# Patient Record
Sex: Female | Born: 1970
Health system: Southern US, Community
[De-identification: ages and names within clinical notes are randomized; demographics above are authoritative.]

## PROBLEM LIST (undated history)

## (undated) ENCOUNTER — Emergency Department (HOSPITAL_COMMUNITY): Payer: Self-pay

## (undated) DIAGNOSIS — J45909 Unspecified asthma, uncomplicated: Secondary | ICD-10-CM

## (undated) DIAGNOSIS — N750 Cyst of Bartholin's gland: Secondary | ICD-10-CM

## (undated) DIAGNOSIS — K4090 Unilateral inguinal hernia, without obstruction or gangrene, not specified as recurrent: Secondary | ICD-10-CM

## (undated) DIAGNOSIS — D649 Anemia, unspecified: Secondary | ICD-10-CM

## (undated) DIAGNOSIS — I10 Essential (primary) hypertension: Secondary | ICD-10-CM

## (undated) DIAGNOSIS — G935 Compression of brain: Secondary | ICD-10-CM

## (undated) DIAGNOSIS — G709 Myoneural disorder, unspecified: Secondary | ICD-10-CM

## (undated) DIAGNOSIS — J849 Interstitial pulmonary disease, unspecified: Secondary | ICD-10-CM

## (undated) DIAGNOSIS — C801 Malignant (primary) neoplasm, unspecified: Secondary | ICD-10-CM

## (undated) DIAGNOSIS — F419 Anxiety disorder, unspecified: Secondary | ICD-10-CM

## (undated) HISTORY — PX: NO PAST SURGERIES: SHX2092

## (undated) HISTORY — DX: Compression of brain: G93.5

## (undated) HISTORY — PX: OTHER SURGICAL HISTORY: SHX169

---

## 1999-01-04 ENCOUNTER — Encounter: Payer: Self-pay | Admitting: Emergency Medicine

## 1999-01-04 ENCOUNTER — Emergency Department (HOSPITAL_COMMUNITY): Admission: EM | Admit: 1999-01-04 | Discharge: 1999-01-04 | Payer: Self-pay | Admitting: Emergency Medicine

## 1999-12-03 ENCOUNTER — Emergency Department (HOSPITAL_COMMUNITY): Admission: EM | Admit: 1999-12-03 | Discharge: 1999-12-03 | Payer: Self-pay | Admitting: Emergency Medicine

## 1999-12-03 ENCOUNTER — Encounter: Payer: Self-pay | Admitting: Emergency Medicine

## 2000-08-24 ENCOUNTER — Emergency Department (HOSPITAL_COMMUNITY): Admission: EM | Admit: 2000-08-24 | Discharge: 2000-08-24 | Payer: Self-pay | Admitting: Internal Medicine

## 2001-03-01 ENCOUNTER — Emergency Department (HOSPITAL_COMMUNITY): Admission: EM | Admit: 2001-03-01 | Discharge: 2001-03-01 | Payer: Self-pay | Admitting: Emergency Medicine

## 2001-03-01 ENCOUNTER — Encounter: Payer: Self-pay | Admitting: Emergency Medicine

## 2001-03-03 ENCOUNTER — Other Ambulatory Visit: Admission: RE | Admit: 2001-03-03 | Discharge: 2001-03-03 | Payer: Self-pay | Admitting: Gynecology

## 2001-04-28 ENCOUNTER — Encounter: Payer: Self-pay | Admitting: Gynecology

## 2001-04-28 ENCOUNTER — Ambulatory Visit (HOSPITAL_COMMUNITY): Admission: RE | Admit: 2001-04-28 | Discharge: 2001-04-28 | Payer: Self-pay | Admitting: Gynecology

## 2002-03-30 ENCOUNTER — Emergency Department (HOSPITAL_COMMUNITY): Admission: EM | Admit: 2002-03-30 | Discharge: 2002-03-31 | Payer: Self-pay | Admitting: Emergency Medicine

## 2002-05-24 ENCOUNTER — Emergency Department (HOSPITAL_COMMUNITY): Admission: EM | Admit: 2002-05-24 | Discharge: 2002-05-24 | Payer: Self-pay | Admitting: Emergency Medicine

## 2002-05-24 ENCOUNTER — Encounter: Payer: Self-pay | Admitting: Emergency Medicine

## 2003-06-22 ENCOUNTER — Emergency Department (HOSPITAL_COMMUNITY): Admission: EM | Admit: 2003-06-22 | Discharge: 2003-06-22 | Payer: Self-pay | Admitting: Emergency Medicine

## 2003-11-03 ENCOUNTER — Inpatient Hospital Stay (HOSPITAL_COMMUNITY): Admission: AD | Admit: 2003-11-03 | Discharge: 2003-11-03 | Payer: Self-pay | Admitting: Obstetrics

## 2003-12-14 ENCOUNTER — Inpatient Hospital Stay (HOSPITAL_COMMUNITY): Admission: AD | Admit: 2003-12-14 | Discharge: 2003-12-14 | Payer: Self-pay | Admitting: Obstetrics

## 2004-05-21 ENCOUNTER — Emergency Department (HOSPITAL_COMMUNITY): Admission: EM | Admit: 2004-05-21 | Discharge: 2004-05-21 | Payer: Self-pay | Admitting: Emergency Medicine

## 2004-09-14 ENCOUNTER — Emergency Department (HOSPITAL_COMMUNITY): Admission: EM | Admit: 2004-09-14 | Discharge: 2004-09-14 | Payer: Self-pay | Admitting: Emergency Medicine

## 2005-03-11 ENCOUNTER — Inpatient Hospital Stay (HOSPITAL_COMMUNITY): Admission: AD | Admit: 2005-03-11 | Discharge: 2005-03-12 | Payer: Self-pay | Admitting: Obstetrics & Gynecology

## 2005-03-16 ENCOUNTER — Emergency Department (HOSPITAL_COMMUNITY): Admission: EM | Admit: 2005-03-16 | Discharge: 2005-03-16 | Payer: Self-pay | Admitting: Emergency Medicine

## 2006-10-10 ENCOUNTER — Emergency Department (HOSPITAL_COMMUNITY): Admission: EM | Admit: 2006-10-10 | Discharge: 2006-10-11 | Payer: Self-pay | Admitting: Emergency Medicine

## 2006-10-17 ENCOUNTER — Emergency Department (HOSPITAL_COMMUNITY): Admission: EM | Admit: 2006-10-17 | Discharge: 2006-10-17 | Payer: Self-pay | Admitting: Family Medicine

## 2009-01-04 ENCOUNTER — Emergency Department (HOSPITAL_COMMUNITY): Admission: EM | Admit: 2009-01-04 | Discharge: 2009-01-04 | Payer: Self-pay | Admitting: Emergency Medicine

## 2010-04-30 ENCOUNTER — Emergency Department (HOSPITAL_COMMUNITY): Admission: EM | Admit: 2010-04-30 | Discharge: 2010-04-30 | Payer: Self-pay | Admitting: Emergency Medicine

## 2010-08-05 ENCOUNTER — Encounter: Payer: Self-pay | Admitting: Obstetrics & Gynecology

## 2010-08-06 ENCOUNTER — Encounter: Payer: Self-pay | Admitting: Obstetrics & Gynecology

## 2010-10-22 LAB — URINE CULTURE: Colony Count: 9000

## 2010-10-22 LAB — URINALYSIS, ROUTINE W REFLEX MICROSCOPIC
Glucose, UA: NEGATIVE mg/dL
Hgb urine dipstick: NEGATIVE
Ketones, ur: NEGATIVE mg/dL
Nitrite: NEGATIVE
Protein, ur: NEGATIVE mg/dL
Specific Gravity, Urine: 1.029 (ref 1.005–1.030)
Urobilinogen, UA: 1 mg/dL (ref 0.0–1.0)
pH: 5 (ref 5.0–8.0)

## 2010-10-22 LAB — POCT PREGNANCY, URINE: Preg Test, Ur: NEGATIVE

## 2010-11-12 ENCOUNTER — Emergency Department (HOSPITAL_COMMUNITY)
Admission: EM | Admit: 2010-11-12 | Discharge: 2010-11-12 | Disposition: A | Discharge status: Home / Self Care | Payer: Self-pay | Attending: Emergency Medicine | Admitting: Emergency Medicine

## 2010-11-12 DIAGNOSIS — R3915 Urgency of urination: Secondary | ICD-10-CM | POA: Insufficient documentation

## 2010-11-12 DIAGNOSIS — N12 Tubulo-interstitial nephritis, not specified as acute or chronic: Secondary | ICD-10-CM | POA: Insufficient documentation

## 2010-11-12 DIAGNOSIS — R112 Nausea with vomiting, unspecified: Secondary | ICD-10-CM | POA: Insufficient documentation

## 2010-11-12 DIAGNOSIS — R109 Unspecified abdominal pain: Secondary | ICD-10-CM | POA: Insufficient documentation

## 2010-11-12 DIAGNOSIS — R350 Frequency of micturition: Secondary | ICD-10-CM | POA: Insufficient documentation

## 2010-11-12 DIAGNOSIS — R509 Fever, unspecified: Secondary | ICD-10-CM | POA: Insufficient documentation

## 2010-11-12 LAB — URINALYSIS, ROUTINE W REFLEX MICROSCOPIC
Glucose, UA: NEGATIVE mg/dL
Hgb urine dipstick: NEGATIVE
Ketones, ur: NEGATIVE mg/dL
Nitrite: NEGATIVE
Protein, ur: 100 mg/dL — AB
Specific Gravity, Urine: 1.031 — ABNORMAL HIGH (ref 1.005–1.030)
Urobilinogen, UA: 1 mg/dL (ref 0.0–1.0)
pH: 8.5 — ABNORMAL HIGH (ref 5.0–8.0)

## 2010-11-12 LAB — POCT PREGNANCY, URINE: Preg Test, Ur: NEGATIVE

## 2010-11-12 LAB — URINE MICROSCOPIC-ADD ON

## 2011-02-13 ENCOUNTER — Emergency Department (HOSPITAL_COMMUNITY)
Admission: EM | Admit: 2011-02-13 | Discharge: 2011-02-13 | Disposition: A | Discharge status: Home / Self Care | Payer: Self-pay | Attending: Emergency Medicine | Admitting: Emergency Medicine

## 2011-02-13 DIAGNOSIS — J3489 Other specified disorders of nose and nasal sinuses: Secondary | ICD-10-CM | POA: Insufficient documentation

## 2011-02-13 DIAGNOSIS — R51 Headache: Secondary | ICD-10-CM | POA: Insufficient documentation

## 2011-04-08 ENCOUNTER — Encounter: Payer: Self-pay | Admitting: Obstetrics & Gynecology

## 2011-09-05 ENCOUNTER — Encounter (HOSPITAL_COMMUNITY): Payer: Self-pay | Admitting: *Deleted

## 2011-09-05 ENCOUNTER — Emergency Department (HOSPITAL_COMMUNITY)
Admission: EM | Admit: 2011-09-05 | Discharge: 2011-09-05 | Disposition: A | Discharge status: Home Health | Payer: Medicaid Other | Attending: Emergency Medicine | Admitting: Emergency Medicine

## 2011-09-05 ENCOUNTER — Emergency Department (HOSPITAL_COMMUNITY): Payer: Medicaid Other

## 2011-09-05 DIAGNOSIS — R509 Fever, unspecified: Secondary | ICD-10-CM | POA: Insufficient documentation

## 2011-09-05 DIAGNOSIS — F17200 Nicotine dependence, unspecified, uncomplicated: Secondary | ICD-10-CM | POA: Insufficient documentation

## 2011-09-05 DIAGNOSIS — R109 Unspecified abdominal pain: Secondary | ICD-10-CM | POA: Insufficient documentation

## 2011-09-05 DIAGNOSIS — J111 Influenza due to unidentified influenza virus with other respiratory manifestations: Secondary | ICD-10-CM | POA: Insufficient documentation

## 2011-09-05 LAB — CBC
HCT: 36.4 % (ref 36.0–46.0)
Hemoglobin: 13 g/dL (ref 12.0–15.0)
MCH: 24.3 pg — ABNORMAL LOW (ref 26.0–34.0)
MCHC: 35.7 g/dL (ref 30.0–36.0)
MCV: 68 fL — ABNORMAL LOW (ref 78.0–100.0)
Platelets: 192 10*3/uL (ref 150–400)
RBC: 5.35 MIL/uL — ABNORMAL HIGH (ref 3.87–5.11)
RDW: 16.3 % — ABNORMAL HIGH (ref 11.5–15.5)
WBC: 3.7 10*3/uL — ABNORMAL LOW (ref 4.0–10.5)

## 2011-09-05 LAB — URINALYSIS, ROUTINE W REFLEX MICROSCOPIC
Glucose, UA: NEGATIVE mg/dL
Hgb urine dipstick: NEGATIVE
Ketones, ur: 15 mg/dL — AB
Leukocytes, UA: NEGATIVE
Nitrite: NEGATIVE
Protein, ur: 30 mg/dL — AB
Specific Gravity, Urine: >1.046 — ABNORMAL HIGH (ref 1.005–1.030)
Urobilinogen, UA: 1 mg/dL (ref 0.0–1.0)
pH: 5.5 (ref 5.0–8.0)

## 2011-09-05 LAB — URINE MICROSCOPIC-ADD ON

## 2011-09-05 LAB — COMPREHENSIVE METABOLIC PANEL
ALT: 23 U/L (ref 0–35)
AST: 27 U/L (ref 0–37)
Albumin: 4 g/dL (ref 3.5–5.2)
Alkaline Phosphatase: 55 U/L (ref 39–117)
BUN: 11 mg/dL (ref 6–23)
CO2: 24 mEq/L (ref 19–32)
Calcium: 9.3 mg/dL (ref 8.4–10.5)
Chloride: 104 mEq/L (ref 96–112)
Creatinine, Ser: 0.77 mg/dL (ref 0.50–1.10)
GFR calc Af Amer: >90 mL/min (ref >90)
GFR calc non Af Amer: >90 mL/min (ref >90)
Glucose, Bld: 101 mg/dL — ABNORMAL HIGH (ref 70–99)
Potassium: 3.6 mEq/L (ref 3.5–5.1)
Sodium: 139 mEq/L (ref 135–145)
Total Bilirubin: 0.2 mg/dL — ABNORMAL LOW (ref 0.3–1.2)
Total Protein: 8.2 g/dL (ref 6.0–8.3)

## 2011-09-05 LAB — DIFFERENTIAL
Basophils Absolute: 0 10*3/uL (ref 0.0–0.1)
Basophils Relative: 1 % (ref 0–1)
Eosinophils Absolute: 0 10*3/uL (ref 0.0–0.7)
Eosinophils Relative: 0 % (ref 0–5)
Lymphocytes Relative: 36 % (ref 12–46)
Lymphs Abs: 1.4 10*3/uL (ref 0.7–4.0)
Monocytes Absolute: 1.1 10*3/uL — ABNORMAL HIGH (ref 0.1–1.0)
Monocytes Relative: 31 % — ABNORMAL HIGH (ref 3–12)
Neutro Abs: 1.2 10*3/uL — ABNORMAL LOW (ref 1.7–7.7)
Neutrophils Relative %: 32 % — ABNORMAL LOW (ref 43–77)

## 2011-09-05 LAB — PREGNANCY, URINE: Preg Test, Ur: NEGATIVE

## 2011-09-05 LAB — LIPASE, BLOOD: Lipase: 21 U/L (ref 11–59)

## 2011-09-05 MED ORDER — SODIUM CHLORIDE 0.9 % IV BOLUS (SEPSIS)
1000.0000 mL | Freq: Once | INTRAVENOUS | Status: AC
  Administered 2011-09-05: 1000 mL via INTRAVENOUS

## 2011-09-05 MED ORDER — MORPHINE SULFATE 4 MG/ML IJ SOLN
4.0000 mg | Freq: Once | INTRAMUSCULAR | Status: AC
  Administered 2011-09-05: 4 mg via INTRAVENOUS
  Filled 2011-09-05: qty 1

## 2011-09-05 MED ORDER — ONDANSETRON HCL 4 MG/2ML IJ SOLN
4.0000 mg | Freq: Once | INTRAMUSCULAR | Status: AC
  Administered 2011-09-05: 4 mg via INTRAVENOUS
  Filled 2011-09-05: qty 2

## 2011-09-05 MED ORDER — OSELTAMIVIR PHOSPHATE 75 MG PO CAPS: 75.0000 mg | ORAL_CAPSULE | Freq: Two times a day (BID) | ORAL | Status: AC

## 2011-09-05 MED ORDER — TRAMADOL HCL 50 MG PO TABS: 50.0000 mg | ORAL_TABLET | Freq: Four times a day (QID) | ORAL | Status: AC | PRN

## 2011-09-05 MED ORDER — PROMETHAZINE HCL 25 MG PO TABS: 25.0000 mg | ORAL_TABLET | Freq: Four times a day (QID) | ORAL | Status: DC | PRN

## 2011-09-05 NOTE — ED Notes (Signed)
Pt reports abd pain with n/v/d, body aches, and fever since Tuesday.  Pt denies any diarrhea today.

## 2011-09-05 NOTE — ED Provider Notes (Signed)
History     CSN: [id number]  Arrival date & time 09/05/11  1549   First MD Initiated Contact with Patient 09/05/11 1727      Chief Complaint  Patient presents with  . Fever  . Abdominal Pain    with n/v/d    (Consider location/radiation/quality/duration/timing/severity/associated sxs/prior treatment) Patient is a 41 y.o. female presenting with fever and abdominal pain. The history is provided by the patient.  Fever Primary symptoms of the febrile illness include fever, headaches, cough, nausea, vomiting, diarrhea, dysuria and myalgias. Primary symptoms do not include wheezing, shortness of breath, abdominal pain or rash. The current episode started 2 days ago.  The headache is not associated with neck stiffness or weakness.  The myalgias are not associated with weakness.   Abdominal Pain The primary symptoms of the illness include fever, nausea, vomiting, diarrhea and dysuria. The primary symptoms of the illness do not include abdominal pain or shortness of breath.  Additional symptoms associated with the illness include back pain. Symptoms associated with the illness do not include constipation.  Pt has had fever chills, myalgias, HA, cough, N/V/D. Pt denies abd pain. She has had urinary frequency and dysuria with low back pain as well.   History reviewed. No pertinent past medical history.  History reviewed. No pertinent past surgical history.  No family history on file.  History  Substance Use Topics  . Smoking status: Current Everyday Smoker -- 1.0 packs/day    Types: Cigarettes  . Smokeless tobacco: Not on file  . Alcohol Use: No    OB History    Grav Para Term Preterm Abortions TAB SAB Ect Mult Living                  Review of Systems  Constitutional: Positive for fever.  HENT: Positive for ear pain and congestion. Negative for sore throat, neck pain, neck stiffness and sinus pressure.   Respiratory: Positive for cough. Negative for shortness of breath and  wheezing.   Cardiovascular: Negative for chest pain and palpitations.  Gastrointestinal: Positive for nausea, vomiting and diarrhea. Negative for abdominal pain and constipation.  Genitourinary: Positive for dysuria and flank pain.  Musculoskeletal: Positive for myalgias and back pain.  Skin: Negative for color change, pallor, rash and wound.  Neurological: Positive for headaches. Negative for dizziness, syncope, weakness and numbness.    Allergies  Review of patient's allergies indicates no known allergies.  Home Medications   Current Outpatient Rx  Name Route Sig Dispense Refill  . DIPHENHYDRAMINE-APAP (SLEEP) 25-500 MG PO TABS Oral Take 1 tablet by mouth at bedtime as needed. sleep    . OSELTAMIVIR PHOSPHATE 75 MG PO CAPS Oral Take 1 capsule (75 mg total) by mouth every 12 (twelve) hours. 10 capsule 0  . PROMETHAZINE HCL 25 MG PO TABS Oral Take 1 tablet (25 mg total) by mouth every 6 (six) hours as needed for nausea. 30 tablet 0  . TRAMADOL HCL 50 MG PO TABS Oral Take 1 tablet (50 mg total) by mouth every 6 (six) hours as needed for pain. 15 tablet 0    BP 110/73  Pulse 81  Temp(Src) 101 F (38.3 C) (Oral)  Resp 19  SpO2 98%  LMP 08/29/2011  Physical Exam  Nursing note and vitals reviewed. Constitutional: She is oriented to person, place, and time. She appears well-developed and well-nourished. No distress.  HENT:  Head: Normocephalic and atraumatic.  Mouth/Throat: Oropharynx is clear and moist.       bl  TM's bulging. No erythema  Eyes: EOM are normal. Pupils are equal, round, and reactive to light.  Neck: Normal range of motion. Neck supple.  Cardiovascular: Normal rate and regular rhythm.   Pulmonary/Chest: Effort normal and breath sounds normal. No respiratory distress. She has no wheezes. She has no rales.  Abdominal: Soft. Bowel sounds are normal. She exhibits no distension. There is no tenderness. There is no rebound and no guarding.  Musculoskeletal: Normal range  of motion. She exhibits no edema and no tenderness.  Neurological: She is alert and oriented to person, place, and time.       5/5 motor, sensation  Skin: Skin is warm and dry. No rash noted. No erythema.  Psychiatric: She has a normal mood and affect. Her behavior is normal.    ED Course  Procedures (including critical care time)  Labs Reviewed  CBC - Abnormal; Notable for the following:    WBC 3.7 (*)    RBC 5.35 (*)    MCV 68.0 (*)    MCH 24.3 (*)    RDW 16.3 (*)    All other components within normal limits  DIFFERENTIAL - Abnormal; Notable for the following:    Neutrophils Relative 32 (*)    Monocytes Relative 31 (*)    Neutro Abs 1.2 (*)    Monocytes Absolute 1.1 (*)    All other components within normal limits  COMPREHENSIVE METABOLIC PANEL - Abnormal; Notable for the following:    Glucose, Bld 101 (*)    Total Bilirubin 0.2 (*)    All other components within normal limits  URINALYSIS, ROUTINE W REFLEX MICROSCOPIC - Abnormal; Notable for the following:    Color, Urine AMBER (*) BIOCHEMICALS MAY BE AFFECTED BY COLOR   APPearance CLOUDY (*)    Specific Gravity, Urine >1.046 (*)    Bilirubin Urine SMALL (*)    Ketones, ur 15 (*)    Protein, ur 30 (*)    All other components within normal limits  URINE MICROSCOPIC-ADD ON - Abnormal; Notable for the following:    Squamous Epithelial / LPF MANY (*)    All other components within normal limits  LIPASE, BLOOD  PREGNANCY, URINE   Dg Chest 2 View  09/05/2011  *RADIOLOGY REPORT*  Clinical Data: Nausea, vomiting, cough, congestion.  CHEST - 2 VIEW  Comparison: None.  Findings: Heart and mediastinal contours are within normal limits. No focal opacities or effusions.  No acute bony abnormality.  IMPRESSION: No active cardiopulmonary disease.  Original Report Authenticated By: Cyndie Chime, M.D.     1. Flu       MDM          Loren Racer, MD 09/06/11 0000

## 2011-09-05 NOTE — Discharge Instructions (Signed)
Influenza, Adult Influenza ("the flu") is a viral infection of the respiratory tract. It causes chills, fever, cough, headache, body aches, and sore throat. Influenza in general will make you feel sicker than when you have a common cold. Symptoms of the illness typically last a few days. Cough and fatigue may continue for as long as 7 to 10 days. Influenza is highly contagious. It spreads easily to others in the droplets from coughs and sneezes. People frequently become infected by touching something that was recently contaminated with the virus and then touch their mouth, nose or eyes. This infection is caused by a virus. Symptoms will not be reduced or improved by taking an antibiotic. Antibiotics are medications that kill bacteria, not viruses. DIAGNOSIS  Diagnosis of influenza is often made based on the history and physical examination as well as the presence of influenza reports occurring in your community. Testing can be done if the diagnosis is not certain. TREATMENT  Since influenza is caused by a virus, antibiotics are not helpful. Your caregiver may prescribe antiviral medicines to shorten the illness and lessen the severity. Your caregiver may also recommend influenza vaccination and/or antiviral medicines for your family members in order to prevent the spread of influenza to them. HOME CARE INSTRUCTIONS  DO NOT GIVE ASPIRIN TO PERSONS WITH INFLUENZA WHO ARE UNDER 18 YEARS OF AGE. This could lead to brain and liver damage (Reye's syndrome). Read the label on over-the-counter medicines.   Stay home from work or school if at all possible until most of your symptoms are gone.   Only take over-the-counter or prescription medicines for pain, discomfort, or fever as directed by your caregiver.   Use a cool mist humidifier to increase air moisture. This will make breathing easier.   Rest until your temperature is nearly normal: 98.6 F (37 C). This usually takes 3 to 4 days. Be sure you get  plenty of rest.   Drink at least eight, eight-ounce glasses of fluids per day. Fluids include water, juice, broth, gelatin, or lemonade.   Cover your mouth and nose when coughing or sneezing and wash your hands often to prevent the spread of this virus to other persons.  PREVENTION  Annual influenza vaccination (flu shots) is the best way to avoid getting influenza. An annual flu shot is now routinely recommended for all adults in the [place] SEEK MEDICAL CARE IF:   You develop shortness of breath while resting.   You have a deep cough with production of mucous or chest pain.   You develop nausea (feeling sick to your stomach), vomiting, or diarrhea.  SEEK IMMEDIATE MEDICAL CARE IF:   You have difficulty breathing, become short of breath, or your skin or nails turn bluish.   You develop severe neck pain or stiffness.   You develop a severe headache, facial pain, or earache.   You have a fever.   You develop nausea or vomiting that cannot be controlled.  Document Released: 06/28/2000 Document Revised: 03/13/2011 Document Reviewed: 05/03/2009 [employer] Patient Information 2012 [employer]. 

## 2011-09-05 NOTE — ED Notes (Addendum)
2nd attempt made to get urine (1st attempt made by RN).  Pt states that she is unable to void.  Fluid is currently running.

## 2011-09-15 ENCOUNTER — Encounter (HOSPITAL_COMMUNITY): Payer: Self-pay

## 2011-09-15 ENCOUNTER — Emergency Department (HOSPITAL_COMMUNITY)
Admission: EM | Admit: 2011-09-15 | Discharge: 2011-09-15 | Disposition: A | Discharge status: Home Health | Payer: Medicaid Other | Attending: Emergency Medicine | Admitting: Emergency Medicine

## 2011-09-15 DIAGNOSIS — B373 Candidiasis of vulva and vagina: Secondary | ICD-10-CM | POA: Insufficient documentation

## 2011-09-15 DIAGNOSIS — A499 Bacterial infection, unspecified: Secondary | ICD-10-CM | POA: Insufficient documentation

## 2011-09-15 DIAGNOSIS — B379 Candidiasis, unspecified: Secondary | ICD-10-CM

## 2011-09-15 DIAGNOSIS — B3731 Acute candidiasis of vulva and vagina: Secondary | ICD-10-CM | POA: Insufficient documentation

## 2011-09-15 DIAGNOSIS — N760 Acute vaginitis: Secondary | ICD-10-CM

## 2011-09-15 DIAGNOSIS — R000 Tachycardia, unspecified: Secondary | ICD-10-CM | POA: Insufficient documentation

## 2011-09-15 DIAGNOSIS — B9689 Other specified bacterial agents as the cause of diseases classified elsewhere: Secondary | ICD-10-CM | POA: Insufficient documentation

## 2011-09-15 DIAGNOSIS — R109 Unspecified abdominal pain: Secondary | ICD-10-CM | POA: Insufficient documentation

## 2011-09-15 DIAGNOSIS — Z711 Person with feared health complaint in whom no diagnosis is made: Secondary | ICD-10-CM

## 2011-09-15 DIAGNOSIS — R300 Dysuria: Secondary | ICD-10-CM | POA: Insufficient documentation

## 2011-09-15 LAB — WET PREP, GENITAL: Trich, Wet Prep: NONE SEEN

## 2011-09-15 LAB — URINE MICROSCOPIC-ADD ON

## 2011-09-15 LAB — DIFFERENTIAL
Basophils Absolute: 0 10*3/uL (ref 0.0–0.1)
Basophils Relative: 0 % (ref 0–1)
Eosinophils Absolute: 0.1 10*3/uL (ref 0.0–0.7)
Eosinophils Relative: 1 % (ref 0–5)
Lymphocytes Relative: 27 % (ref 12–46)
Lymphs Abs: 3.5 10*3/uL (ref 0.7–4.0)
Monocytes Absolute: 2 10*3/uL — ABNORMAL HIGH (ref 0.1–1.0)
Monocytes Relative: 16 % — ABNORMAL HIGH (ref 3–12)
Neutro Abs: 7.2 10*3/uL (ref 1.7–7.7)
Neutrophils Relative %: 56 % (ref 43–77)

## 2011-09-15 LAB — URINALYSIS, ROUTINE W REFLEX MICROSCOPIC
Bilirubin Urine: NEGATIVE
Glucose, UA: NEGATIVE mg/dL
Hgb urine dipstick: NEGATIVE
Ketones, ur: NEGATIVE mg/dL
Nitrite: NEGATIVE
Protein, ur: NEGATIVE mg/dL
Specific Gravity, Urine: 1.027 (ref 1.005–1.030)
Urobilinogen, UA: 1 mg/dL (ref 0.0–1.0)
pH: 7.5 (ref 5.0–8.0)

## 2011-09-15 LAB — CBC
HCT: 34 % — ABNORMAL LOW (ref 36.0–46.0)
Hemoglobin: 12.3 g/dL (ref 12.0–15.0)
MCH: 24 pg — ABNORMAL LOW (ref 26.0–34.0)
MCHC: 36.2 g/dL — ABNORMAL HIGH (ref 30.0–36.0)
MCV: 66.4 fL — ABNORMAL LOW (ref 78.0–100.0)
Platelets: 286 10*3/uL (ref 150–400)
RBC: 5.12 MIL/uL — ABNORMAL HIGH (ref 3.87–5.11)
RDW: 15.9 % — ABNORMAL HIGH (ref 11.5–15.5)
WBC: 12.8 10*3/uL — ABNORMAL HIGH (ref 4.0–10.5)

## 2011-09-15 MED ORDER — FLUCONAZOLE 200 MG PO TABS: 200.0000 mg | ORAL_TABLET | Freq: Every day | ORAL | Status: AC

## 2011-09-15 MED ORDER — CEFTRIAXONE SODIUM 250 MG IJ SOLR
250.0000 mg | Freq: Once | INTRAMUSCULAR | Status: AC
  Administered 2011-09-15: 250 mg via INTRAMUSCULAR
  Filled 2011-09-15: qty 250

## 2011-09-15 MED ORDER — AZITHROMYCIN 250 MG PO TABS
1000.0000 mg | ORAL_TABLET | Freq: Once | ORAL | Status: AC
  Administered 2011-09-15: 1000 mg via ORAL
  Filled 2011-09-15: qty 4

## 2011-09-15 MED ORDER — HYDROCODONE-ACETAMINOPHEN 5-325 MG PO TABS: 1.0000 | ORAL_TABLET | Freq: Four times a day (QID) | ORAL | Status: AC | PRN

## 2011-09-15 MED ORDER — OXYCODONE-ACETAMINOPHEN 5-325 MG PO TABS
1.0000 | ORAL_TABLET | Freq: Once | ORAL | Status: AC
  Administered 2011-09-15: 1 via ORAL
  Filled 2011-09-15: qty 1

## 2011-09-15 MED ORDER — METRONIDAZOLE 500 MG PO TABS: 500.0000 mg | ORAL_TABLET | Freq: Two times a day (BID) | ORAL | Status: AC

## 2011-09-15 NOTE — Discharge Instructions (Signed)
You have been treated in the emergency department for an infection, possibly sexually transmitted. Results of your gonorrhea and chlamydia tests are pending and you will be notified if they are positive. It is very important to practice safe sex and use condoms when sexually active. If your results are positive you need to notify all sexual partners so they can be treated as well. The website https://garcia.net/ can be used to send anonymous text messages or emails to alert sexual contacts. Follow up with your doctor, or OBGYN in regards to today's visit.   Remember as we discussed return to the emergency department if your abdominal pain does not improve with treatment of your pelvic infection.  You have refused recommended imaging of your abdomen (CT exam). It is VERY important to return to the ED if you develop a fever >101 that persists or your abdominal pain worsens in ANY way.    .Bacterial Vaginosis Bacterial vaginosis (BV) is a vaginal infection where the normal balance of bacteria in the vagina is disrupted. The normal balance is then replaced by an overgrowth of certain bacteria. There are several different kinds of bacteria that can cause BV. BV is the most common vaginal infection in women of childbearing age. CAUSES   The cause of BV is not fully understood. BV develops when there is an increase or imbalance of harmful bacteria.   Some activities or behaviors can upset the normal balance of bacteria in the vagina and put women at increased risk including:   Having a new sex partner or multiple sex partners.   Douching.   Using an intrauterine device (IUD) for contraception.   It is not clear what role sexual activity plays in the development of BV. However, women that have never had sexual intercourse are rarely infected with BV.  Women do not get BV from toilet seats, bedding, swimming pools or from touching objects around them.  SYMPTOMS   Grey vaginal discharge.   A  fish-like odor with discharge, especially after sexual intercourse.   Itching or burning of the vagina and vulva.   Burning or pain with urination.   Some women have no signs or symptoms at all.  DIAGNOSIS  Your caregiver must examine the vagina for signs of BV. Your caregiver will perform lab tests and look at the sample of vaginal fluid through a microscope. They will look for bacteria and abnormal cells (clue cells), a pH test higher than 4.5, and a positive amine test all associated with BV.  RISKS AND COMPLICATIONS   Pelvic inflammatory disease (PID).   Infections following gynecology surgery.   Developing HIV.   Developing herpes virus.  TREATMENT  Sometimes BV will clear up without treatment. However, all women with symptoms of BV should be treated to avoid complications, especially if gynecology surgery is planned. Female partners generally do not need to be treated. However, BV may spread between female sex partners so treatment is helpful in preventing a recurrence of BV.   BV may be treated with antibiotics. The antibiotics come in either pill or vaginal cream forms. Either can be used with nonpregnant or pregnant women, but the recommended dosages differ. These antibiotics are not harmful to the baby.   BV can recur after treatment. If this happens, a second round of antibiotics will often be prescribed.   Treatment is important for pregnant women. If not treated, BV can cause a premature delivery, especially for a pregnant woman who had a premature birth in the past.  All pregnant women who have symptoms of BV should be checked and treated.   For chronic reoccurrence of BV, treatment with a type of prescribed gel vaginally twice a week is helpful.  HOME CARE INSTRUCTIONS   Finish all medication as directed by your caregiver.   Do not have sex until treatment is completed.   Tell your sexual partner that you have a vaginal infection. They should see their caregiver and be  treated if they have problems, such as a mild rash or itching.   Practice safe sex. Use condoms. Only have 1 sex partner.  PREVENTION  Basic prevention steps can help reduce the risk of upsetting the natural balance of bacteria in the vagina and developing BV:  Do not have sexual intercourse (be abstinent).   Do not douche.   Use all of the medicine prescribed for treatment of BV, even if the signs and symptoms go away.   Tell your sex partner if you have BV. That way, they can be treated, if needed, to prevent reoccurrence.  SEEK MEDICAL CARE IF:   Your symptoms are not improving after 3 days of treatment.   You have increased discharge, pain, or fever.  MAKE SURE YOU:   Understand these instructions.   Will watch your condition.   Will get help right away if you are not doing well or get worse.  FOR MORE INFORMATION  Division of STD Prevention (DSTDP), Centers for Disease Control and Prevention: SolutionApps.co.za American Social Health Association (ASHA): www.ashastd.org  Document Released: 07/01/2005 Document Revised: 06/20/2011 Document Reviewed: 12/22/2008 [facility] Patient Information 2012 [place], [place]. Gonorrhea and Chlamydia SYMPTOMS  In females, symptoms may go unnoticed. Symptoms that are more noticeable can include:  Belly (abdominal) pain.  Painful intercourse.  Watery mucous-like discharge from the vagina.  Miscarriage.  Discomfort when urinating.  Inflammation of the rectum.  Abnormal gray-green frothy vaginal discharge  Vaginal itching and irritatio  Itching and irritation of the area outside the vagina.   Painful urination.  Bleeding after sexual intercourse.  In males, symptoms include:  Burning with urination.  Pain in the testicles.  Watery mucous-like discharge from the penis.  It can cause longstanding (chronic) pelvic pain after frequent infections.  TREATMENT  PID can cause women to not be able to have children (sterile) if left untreated or if  half-treated.  It is important to finish ALL medications given to you.  This is a sexually transmitted infection. So you are also at risk for other sexually transmitted diseases, including HIV (AIDS), it is recommended that you get tested. HOME CARE INSTRUCTIONS  Warning: This infection is contagious. Do not have sex until treatment is completed. Follow up at your caregiver's office or the clinic to which you were referred. If your diagnosis (learning what is wrong) is confirmed by culture or some other method, your recent sexual contacts need treatment. Even if they are symptom free or have a negative culture or evaluation, they should be treated.  PREVENTION  Women should use sanitary pads instead of tampons for vaginal discharge.  Wipe front to back after using the toilet and avoid douching.   Practice safe sex, use condoms, have only one sex partner and be sure your sex partner is not having sex with others.  Ask your caregiver to test you for chlamydia at your regular checkups or sooner if you are having symptoms.  Ask for further information if you are pregnant.  SEEK IMMEDIATE MEDICAL CARE IF:  You develop an oral  temperature above 102 F (38.9 C), not controlled by medications or lasting more than 2 days.  You develop an increase in pain.  You develop any type of abnormal discharge.  You develop vaginal bleeding and it is not time for your period.  You develop painful intercourse.   Bacterial Vaginosis  Bacterial vaginosis (BV) is a vaginal infection where the normal balance of bacteria in the vagina is disrupted. This is not a sexually transmitted disease and your sexual partners do NOT need to be treated. CAUSES  The cause of BV is not fully understood. BV develops when there is an increase or imbalance of harmful bacteria.  Some activities or behaviors can upset the normal balance of bacteria in the vagina and put women at increased risk including:  Having a new sex partner or  multiple sex partners.  Douching.  Using an intrauterine device (IUD) for contraception.  It is not clear what role sexual activity plays in the development of BV. However, women that have never had sexual intercourse are rarely infected with BV.  Women do not get BV from toilet seats, bedding, swimming pools or from touching objects around them.   SYMPTOMS  Grey vaginal discharge.  A fish-like odor with discharge, especially after sexual intercourse.  Itching or burning of the vagina and vulva.  Burning or pain with urination.  Some women have no signs or symptoms at all.   TREATMENT  Sometimes BV will clear up without treatment.  BV may be treated with antibiotics.  BV can recur after treatment. If this happens, a second round of antibiotics will often be prescribed.  HOME CARE INSTRUCTIONS  Finish all medication as directed by your caregiver.  Do not have sex until treatment is completed.  Do NOT drink any alcoholic beverages while being treated  with Metronidazole (Flagyl). This will cause a severe reaction inducing vomiting.  RESOURCE GUIDE  Dental Problems  Patients with Medicaid: [facility] [phone]                                           [phone] [address] Phone:  [phone]                                                  Phone:  [phone]  If unable to pay or uninsured, contact:  [facility] or [facility] to become qualified for the adult dental clinic.  Chronic Pain Problems Contact [facility]  [phone] Patients need to be referred by their primary care doctor.  Insufficient Money for Medicine Contact [employer]:  call "211" or [facility] 8085828498.  No Primary Care Doctor Call [facility]  [phone] Other agencies that provide inexpensive medical care    [facility]  [phone]    [facility]  [phone]    [facility]  [phone]    [facility]  [phone]    [facility]anned [facility]  [phone]    [facility]  [phone]  Psychological Services [facility]  [phone] [facility]  [phone] [facility]   [phone] (emergency services [phone])  Substance Abuse Resources [facility]  ([phone] [facility] [phone] [facility] [place] [phone] Floydene Flock [phone] Residential & Outpatient Substance Abuse Program  [phone]  Abuse/Neglect [facility] Child Abuse Hotline 979-668-6352 [facility] Child Abuse Hotline 769-151-9823 (After Hours)  Emergency Shelter [facility] [phone]  Maternity Homes [facility] [place] [facility] [place] [phone] [facility] [phone]  MRSA Hotline #:   770-453-7593    [facility] Resources  [facility] [place]     [employer]                          [facility] Dept. [address] [place]                       [address]      [address]  Blondell Reveal Phone:  [phone]                                   Phone:  [phone]                 Phone:  [phone]  [facility] Phone:  [phone]  [facility] Child Abuse Hotline [phone] 8676365556 (After Hours)

## 2011-09-15 NOTE — ED Provider Notes (Signed)
History     CSN: [id number]  Arrival date & time 09/15/11  1851   First MD Initiated Contact with Patient 09/15/11 2007      Chief Complaint  Patient presents with  . Abdominal Pain  . Dysuria    (Consider location/radiation/quality/duration/timing/severity/associated sxs/prior treatment) HPI Comments: Patient history tobacco abuse presents emergency Department with suprapubic abdominal pain.  The abdominal pain started 3 days ago and rates at a 10 out of 10 in severity.  Patient did not come in because she assumed the pain go away however she feels as gradually worsening.  Patient reports mild dysuria, denies hematuria nausea, vomiting, diarrhea, constipation, vaginal bleeding, vaginal discharge.  Only exacerbating factor is palpating the region of abdominal pain and and movement.  Patient denies any alleviating factors.  Patient states that she was evaluated in this emergency department last week for his stomach flu and was told at that time that she had an abdominal hernia.    Patient is a 41 y.o. female presenting with abdominal pain and dysuria.  Abdominal Pain The primary symptoms of the illness include abdominal pain and dysuria. The primary symptoms of the illness do not include fever, fatigue, shortness of breath, nausea, vomiting, diarrhea, hematemesis, hematochezia, vaginal discharge or vaginal bleeding. Episode onset: 3 days ago.  Symptoms associated with the illness do not include constipation.  Dysuria  Pertinent negatives include no nausea and no vomiting.    History reviewed. No pertinent past medical history.  History reviewed. No pertinent past surgical history.  No family history on file.  History  Substance Use Topics  . Smoking status: Current Everyday Smoker -- 1.0 packs/day    Types: Cigarettes  . Smokeless tobacco: Not on file  . Alcohol Use: No    OB History    Grav Para Term Preterm Abortions TAB SAB Ect Mult Living                  Review of  Systems  Constitutional: Negative for fever, activity change, appetite change and fatigue.  HENT: Negative for neck pain and neck stiffness.   Respiratory: Negative for shortness of breath.   Gastrointestinal: Positive for abdominal pain. Negative for nausea, vomiting, diarrhea, constipation, blood in stool, hematochezia, anal bleeding, rectal pain and hematemesis.  Genitourinary: Positive for dysuria. Negative for vaginal bleeding and vaginal discharge.  All other systems reviewed and are negative.    Allergies  Review of patient's allergies indicates no known allergies.  Home Medications   Current Outpatient Rx  Name Route Sig Dispense Refill  . DIPHENHYDRAMINE-APAP (SLEEP) 25-500 MG PO TABS Oral Take 1 tablet by mouth at bedtime as needed. sleep    . OSELTAMIVIR PHOSPHATE 75 MG PO CAPS Oral Take 1 capsule (75 mg total) by mouth every 12 (twelve) hours. 10 capsule 0  . TRAMADOL HCL 50 MG PO TABS Oral Take 1 tablet (50 mg total) by mouth every 6 (six) hours as needed for pain. 15 tablet 0  . FLUCONAZOLE 200 MG PO TABS Oral Take 1 tablet (200 mg total) by mouth daily. 7 tablet 0  . METRONIDAZOLE 500 MG PO TABS Oral Take 1 tablet (500 mg total) by mouth 2 (two) times daily. 14 tablet 0    BP 107/81  Pulse 106  Temp(Src) 99.5 F (37.5 C) (Oral)  Resp 16  Ht 5\' 4"  (1.626 m)  Wt 154 lb (69.854 kg)  BMI 26.43 kg/m2  SpO2 100%  LMP 08/29/2011  Physical Exam  Nursing note and  vitals reviewed. Constitutional: Vital signs are normal. She appears well-developed and well-nourished. No distress.  HENT:  Head: Normocephalic and atraumatic.  Mouth/Throat: Uvula is midline, oropharynx is clear and moist and mucous membranes are normal.  Eyes: Conjunctivae and EOM are normal. Pupils are equal, round, and reactive to light.  Neck: Normal range of motion and full passive range of motion without pain. Neck supple. No spinous process tenderness and no muscular tenderness present. No rigidity.  No Brudzinski's sign noted.  Cardiovascular: Regular rhythm and intact distal pulses.  Exam reveals no gallop and no friction rub.   No murmur heard.      Tachycardic, 106  Pulmonary/Chest: Effort normal and breath sounds normal. No accessory muscle usage. Not tachypneic. No respiratory distress.  Abdominal: Soft. Normal appearance. She exhibits no distension, no ascites, no pulsatile midline mass and no mass. There is tenderness. There is no CVA tenderness. No hernia.    Genitourinary:       Exam performed by Jaci Carrel,  exam chaperoned Date: 09/15/2011 Pelvic exam: normal external genitalia without evidence of trauma. VULVA: normal appearing vulva with no masses, tenderness or lesion. VAGINA: normal appearing vagina with normal color and discharge, no lesions. CERVIX: normal appearing cervix without lesions, mild cervical ttp, no chandelier sign, cervical os closed with out purulent discharge; vaginal discharge - white, copious, creamy and curd-like, Wet prep and DNA probe for chlamydia and GC obtained.   ADNEXA: normal adnexa in size, nontender and no masses    Lymphadenopathy:    She has no cervical adenopathy.  Neurological: She is alert.  Skin: Skin is warm and dry. No rash noted. She is not diaphoretic.  Psychiatric: She has a normal mood and affect. Her speech is normal and behavior is normal.    ED Course  Procedures (including critical care time)  Labs Reviewed  URINALYSIS, ROUTINE W REFLEX MICROSCOPIC - Abnormal; Notable for the following:    APPearance CLOUDY (*)    Leukocytes, UA SMALL (*)    All other components within normal limits  URINE MICROSCOPIC-ADD ON - Abnormal; Notable for the following:    Squamous Epithelial / LPF MANY (*)    All other components within normal limits  CBC - Abnormal; Notable for the following:    WBC 12.8 (*)    RBC 5.12 (*)    HCT 34.0 (*)    MCV 66.4 (*)    MCH 24.0 (*)    MCHC 36.2 (*)    RDW 15.9 (*)    All other components  within normal limits  DIFFERENTIAL - Abnormal; Notable for the following:    Monocytes Relative 16 (*)    Monocytes Absolute 2.0 (*)    All other components within normal limits  WET PREP, GENITAL - Abnormal; Notable for the following:    Yeast Wet Prep HPF POC FEW (*)    Clue Cells Wet Prep HPF POC TOO NUMEROUS TO COUNT (*)    WBC, Wet Prep HPF POC TOO NUMEROUS TO COUNT (*)    All other components within normal limits  GC/CHLAMYDIA PROBE AMP, GENITAL   No results found.  Pt declined IV acces & CT exam. She is ttp over hernia. She verbalizes understanding that she is declining this test against medical advice and states that she has to leave the ED. I will treat pts possible STD and have given her strict instructions to return to the ED if her abdominal pain does not improve or worsens  1. BV (bacterial  vaginosis)   2. Yeast infection   3. Concern about STD in female without diagnosis       MDM  BV, Yeast, and question STD (cultures pending)  Patient to be discharged with instructions to follow up with OBGYN. Discussed importance of using protection when sexually active. Pt understands that they have GC/Chlamydia cultures pending and that they will need to inform all sexual partners if results return positive. Pt has been treated prophylacticly with azithromycin and rocephin due to pts history, pelvic exam, and wet prep with increased WBCs. Pt not concerning for PID because hemodynamically stable and no cervical motion tenderness on pelvic exam. Pt has also been treated with flagyl for Bacterial Vaginosis. Pt has been advised to not drink alcohol while on this medication. Pt has also been advised to return to the ED if abd pain does not improve for a CT to further evaluate RLQ. She understands that we could not r/o appy vs strangulation of hernia. Pt does not want an IV or CT at this time and states she has to leave and will come back tonight or tmw if symptoms do not improve or if they  worsen.         Jaci Carrel, [place] 09/15/11 2316

## 2011-09-15 NOTE — ED Notes (Signed)
Pt states that she is having LLQ pain that started x 3 days ago and feels like pressure except for when she coughs, then it hurts.  Pt states that she has a known hernia in her RLQ and feels as if the pain is coming from her hernia.

## 2011-09-15 NOTE — ED Notes (Signed)
Pelvic cart is set up except for pelvic light. Lisette PA is aware.

## 2011-09-15 NOTE — ED Notes (Signed)
Pt reports lower abdominal pain and vaginal discharge- pain with urination

## 2011-09-16 LAB — GC/CHLAMYDIA PROBE AMP, GENITAL
Chlamydia, DNA Probe: NEGATIVE
GC Probe Amp, Genital: NEGATIVE

## 2011-09-16 NOTE — ED Provider Notes (Signed)
Medical screening examination/treatment/procedure(s) were performed by non-physician practitioner and as supervising physician I was immediately available for consultation/collaboration.  Zanylah Hardie, MD 09/16/11 0022 

## 2011-09-17 ENCOUNTER — Encounter (HOSPITAL_COMMUNITY): Payer: Self-pay | Admitting: *Deleted

## 2011-09-17 ENCOUNTER — Emergency Department (HOSPITAL_COMMUNITY): Payer: Medicaid Other

## 2011-09-17 ENCOUNTER — Emergency Department (HOSPITAL_COMMUNITY)
Admission: EM | Admit: 2011-09-17 | Discharge: 2011-09-18 | Disposition: A | Discharge status: Home / Self Care | Payer: Medicaid Other | Attending: Emergency Medicine | Admitting: Emergency Medicine

## 2011-09-17 DIAGNOSIS — R10813 Right lower quadrant abdominal tenderness: Secondary | ICD-10-CM | POA: Insufficient documentation

## 2011-09-17 DIAGNOSIS — K5900 Constipation, unspecified: Secondary | ICD-10-CM | POA: Insufficient documentation

## 2011-09-17 DIAGNOSIS — R109 Unspecified abdominal pain: Secondary | ICD-10-CM | POA: Insufficient documentation

## 2011-09-17 DIAGNOSIS — K439 Ventral hernia without obstruction or gangrene: Secondary | ICD-10-CM | POA: Insufficient documentation

## 2011-09-17 DIAGNOSIS — R112 Nausea with vomiting, unspecified: Secondary | ICD-10-CM | POA: Insufficient documentation

## 2011-09-17 LAB — URINALYSIS, ROUTINE W REFLEX MICROSCOPIC
Glucose, UA: NEGATIVE mg/dL
Hgb urine dipstick: NEGATIVE
Nitrite: NEGATIVE
Protein, ur: 30 mg/dL — AB
Specific Gravity, Urine: 1.034 — ABNORMAL HIGH (ref 1.005–1.030)
Urobilinogen, UA: 1 mg/dL (ref 0.0–1.0)
pH: 6 (ref 5.0–8.0)

## 2011-09-17 LAB — LIPASE, BLOOD: Lipase: 15 U/L (ref 11–59)

## 2011-09-17 LAB — COMPREHENSIVE METABOLIC PANEL
ALT: 6 U/L (ref 0–35)
AST: 12 U/L (ref 0–37)
Albumin: 3.8 g/dL (ref 3.5–5.2)
Alkaline Phosphatase: 64 U/L (ref 39–117)
BUN: 9 mg/dL (ref 6–23)
CO2: 25 mEq/L (ref 19–32)
Calcium: 9.7 mg/dL (ref 8.4–10.5)
Chloride: 102 mEq/L (ref 96–112)
Creatinine, Ser: 0.59 mg/dL (ref 0.50–1.10)
GFR calc Af Amer: >90 mL/min (ref >90)
GFR calc non Af Amer: >90 mL/min (ref >90)
Glucose, Bld: 92 mg/dL (ref 70–99)
Potassium: 3.7 mEq/L (ref 3.5–5.1)
Sodium: 137 mEq/L (ref 135–145)
Total Bilirubin: 0.2 mg/dL — ABNORMAL LOW (ref 0.3–1.2)
Total Protein: 8.3 g/dL (ref 6.0–8.3)

## 2011-09-17 LAB — DIFFERENTIAL
Basophils Absolute: 0 10*3/uL (ref 0.0–0.1)
Basophils Relative: 0 % (ref 0–1)
Eosinophils Absolute: 0 10*3/uL (ref 0.0–0.7)
Eosinophils Relative: 0 % (ref 0–5)
Lymphocytes Relative: 26 % (ref 12–46)
Lymphs Abs: 3.1 10*3/uL (ref 0.7–4.0)
Monocytes Absolute: 1.5 10*3/uL — ABNORMAL HIGH (ref 0.1–1.0)
Monocytes Relative: 12 % (ref 3–12)
Neutro Abs: 7.5 10*3/uL (ref 1.7–7.7)
Neutrophils Relative %: 62 % (ref 43–77)

## 2011-09-17 LAB — CBC
HCT: 36.3 % (ref 36.0–46.0)
Hemoglobin: 13 g/dL (ref 12.0–15.0)
MCH: 24 pg — ABNORMAL LOW (ref 26.0–34.0)
MCHC: 35.8 g/dL (ref 30.0–36.0)
MCV: 67.1 fL — ABNORMAL LOW (ref 78.0–100.0)
Platelets: 366 10*3/uL (ref 150–400)
RBC: 5.41 MIL/uL — ABNORMAL HIGH (ref 3.87–5.11)
RDW: 15.9 % — ABNORMAL HIGH (ref 11.5–15.5)
WBC: 12.1 10*3/uL — ABNORMAL HIGH (ref 4.0–10.5)

## 2011-09-17 LAB — AMYLASE: Amylase: 73 U/L (ref 0–105)

## 2011-09-17 LAB — URINE MICROSCOPIC-ADD ON

## 2011-09-17 MED ORDER — IOHEXOL 300 MG/ML  SOLN
100.0000 mL | Freq: Once | INTRAMUSCULAR | Status: AC | PRN
  Administered 2011-09-17: 100 mL via INTRAVENOUS

## 2011-09-17 MED ORDER — HYDROMORPHONE HCL PF 1 MG/ML IJ SOLN
1.0000 mg | Freq: Once | INTRAMUSCULAR | Status: AC
  Administered 2011-09-17: 1 mg via INTRAVENOUS
  Filled 2011-09-17: qty 1

## 2011-09-17 MED ORDER — HYDROCODONE-ACETAMINOPHEN 5-325 MG PO TABS: 1.0000 | ORAL_TABLET | ORAL | Status: AC | PRN

## 2011-09-17 MED ORDER — ONDANSETRON HCL 4 MG/2ML IJ SOLN
4.0000 mg | Freq: Once | INTRAMUSCULAR | Status: AC
  Administered 2011-09-17: 4 mg via INTRAVENOUS
  Filled 2011-09-17: qty 2

## 2011-09-17 MED ORDER — SODIUM CHLORIDE 0.9 % IV SOLN
20.0000 mL | INTRAVENOUS | Status: DC
  Administered 2011-09-17: 20 mL via INTRAVENOUS

## 2011-09-17 NOTE — ED Notes (Signed)
Patient transported to CT 

## 2011-09-17 NOTE — ED Notes (Signed)
Patient instructed on the need for pelvic exam at this time. Patient is strongly refusing this procedure; states she just had one 3 days ago and will not be doing one again at this time.  Theron Arista, PA notified and aware.

## 2011-09-17 NOTE — ED Notes (Signed)
Pt in c/o RLQ abd pain, states she has a hernia there and was seen here recently for same, told to return if pain was worse for CT scan

## 2011-09-17 NOTE — ED Provider Notes (Signed)
History     CSN: [id number]  Arrival date & time 09/17/11  1717   First MD Initiated Contact with Patient 09/17/11 2004      Chief Complaint  Patient presents with  . Abdominal Pain     HPI  History provided by the patient. Patient is a 41 year old African American female with past history of inguinal hernia who presents with complaints of right lower quadrant pain and constipation for the past 4 days. Patient states last normal bowel movement was on Friday. She also reports having gradual increase of right lower quadrant pain at that time with firm mass. Pain is constant and described as severe. Pain is made worse with some movements and palpation to the area. Pain is also made worse with trying to eat or drink. Patient has not taken anything for her symptoms. Patient denies any other aggravating or alleviating factors. Patient reports associated nausea, vomiting and decreased appetite. She denies any fever, chills, sweats, dysuria, hematuria, urinary frequency, vaginal discharge or vaginal bleeding. She has no significant abdominal surgery history.    History reviewed. No pertinent past medical history.  History reviewed. No pertinent past surgical history.  History reviewed. No pertinent family history.  History  Substance Use Topics  . Smoking status: Current Everyday Smoker -- 1.0 packs/day    Types: Cigarettes  . Smokeless tobacco: Not on file  . Alcohol Use: No    OB History    Grav Para Term Preterm Abortions TAB SAB Ect Mult Living                  Review of Systems  Constitutional: Positive for appetite change. Negative for fever and chills.  Respiratory: Negative for cough and shortness of breath.   Cardiovascular: Negative for chest pain.  Gastrointestinal: Positive for nausea, vomiting, abdominal pain and constipation.  Genitourinary: Negative for dysuria, frequency, hematuria, flank pain, vaginal bleeding and vaginal discharge.  All other systems reviewed  and are negative.    Allergies  Review of patient's allergies indicates no known allergies.  Home Medications   Current Outpatient Rx  Name Route Sig Dispense Refill  . DIPHENHYDRAMINE-APAP (SLEEP) 25-500 MG PO TABS Oral Take 1 tablet by mouth at bedtime as needed. sleep    . FLUCONAZOLE 200 MG PO TABS Oral Take 1 tablet (200 mg total) by mouth daily. 7 tablet 0  . HYDROCODONE-ACETAMINOPHEN 5-325 MG PO TABS Oral Take 1 tablet by mouth every 6 (six) hours as needed for pain. 15 tablet 0  . METRONIDAZOLE 500 MG PO TABS Oral Take 1 tablet (500 mg total) by mouth 2 (two) times daily. 14 tablet 0    BP 108/72  Pulse 84  Temp 98.7 F (37.1 C)  Resp 14  SpO2 100%  LMP 08/29/2011  Physical Exam  Nursing note and vitals reviewed. Constitutional: She is oriented to person, place, and time. She appears well-developed and well-nourished. No distress.  HENT:  Head: Normocephalic and atraumatic.  Cardiovascular: Normal rate and regular rhythm.   Pulmonary/Chest: Effort normal and breath sounds normal. No respiratory distress. She has no wheezes. She has no rales.  Abdominal: Soft. She exhibits no distension. There is tenderness in the right lower quadrant. There is no rebound, no guarding and negative Murphy's sign. A hernia is present.         Firm hernia in right lower abdomen inguinal area  Neurological: She is alert and oriented to person, place, and time.  Skin: Skin is warm and dry. No  rash noted.  Psychiatric: She has a normal mood and affect. Her behavior is normal.    ED Course  Procedures   Results for orders placed during the hospital encounter of 09/17/11  CBC      Component Value Range   WBC 12.1 (*) 4.0 - 10.5 (K/uL)   RBC 5.41 (*) 3.87 - 5.11 (MIL/uL)   Hemoglobin 13.0  12.0 - 15.0 (g/dL)   HCT 45.4  09.8 - 11.9 (%)   MCV 67.1 (*) 78.0 - 100.0 (fL)   MCH 24.0 (*) 26.0 - 34.0 (pg)   MCHC 35.8  30.0 - 36.0 (g/dL)   RDW 14.7 (*) 82.9 - 15.5 (%)   Platelets 366   150 - 400 (K/uL)  DIFFERENTIAL      Component Value Range   Neutrophils Relative 62  43 - 77 (%)   Lymphocytes Relative 26  12 - 46 (%)   Monocytes Relative 12  3 - 12 (%)   Eosinophils Relative 0  0 - 5 (%)   Basophils Relative 0  0 - 1 (%)   Neutro Abs 7.5  1.7 - 7.7 (K/uL)   Lymphs Abs 3.1  0.7 - 4.0 (K/uL)   Monocytes Absolute 1.5 (*) 0.1 - 1.0 (K/uL)   Eosinophils Absolute 0.0  0.0 - 0.7 (K/uL)   Basophils Absolute 0.0  0.0 - 0.1 (K/uL)   Smear Review MORPHOLOGY UNREMARKABLE    COMPREHENSIVE METABOLIC PANEL      Component Value Range   Sodium 137  135 - 145 (mEq/L)   Potassium 3.7  3.5 - 5.1 (mEq/L)   Chloride 102  96 - 112 (mEq/L)   CO2 25  19 - 32 (mEq/L)   Glucose, Bld 92  70 - 99 (mg/dL)   BUN 9  6 - 23 (mg/dL)   Creatinine, Ser 5.62  0.50 - 1.10 (mg/dL)   Calcium 9.7  8.4 - 13.0 (mg/dL)   Total Protein 8.3  6.0 - 8.3 (g/dL)   Albumin 3.8  3.5 - 5.2 (g/dL)   AST 12  0 - 37 (U/L)   ALT 6  0 - 35 (U/L)   Alkaline Phosphatase 64  39 - 117 (U/L)   Total Bilirubin 0.2 (*) 0.3 - 1.2 (mg/dL)   GFR calc non Af Amer >90  >90 (mL/min)   GFR calc Af Amer >90  >90 (mL/min)  AMYLASE      Component Value Range   Amylase 73  0 - 105 (U/L)  LIPASE, BLOOD      Component Value Range   Lipase 15  11 - 59 (U/L)  URINALYSIS, ROUTINE W REFLEX MICROSCOPIC      Component Value Range   Color, Urine AMBER (*) YELLOW    APPearance TURBID (*) CLEAR    Specific Gravity, Urine 1.034 (*) 1.005 - 1.030    pH 6.0  5.0 - 8.0    Glucose, UA NEGATIVE  NEGATIVE (mg/dL)   Hgb urine dipstick NEGATIVE  NEGATIVE    Bilirubin Urine SMALL (*) NEGATIVE    Ketones, ur TRACE (*) NEGATIVE (mg/dL)   Protein, ur 30 (*) NEGATIVE (mg/dL)   Urobilinogen, UA 1.0  0.0 - 1.0 (mg/dL)   Nitrite NEGATIVE  NEGATIVE    Leukocytes, UA MODERATE (*) NEGATIVE   URINE MICROSCOPIC-ADD ON      Component Value Range   Squamous Epithelial / LPF MANY (*) RARE    WBC, UA 3-6  <3 (WBC/hpf)   Bacteria, UA RARE  RARE  Ct Abdomen Pelvis W Contrast  09/17/2011  *RADIOLOGY REPORT*  Clinical Data:  Generalized abdominal pain, nausea, vomiting  CT ABDOMEN AND PELVIS WITH CONTRAST  Technique:  Multidetector CT imaging of the abdomen and pelvis was performed following the standard protocol during bolus administration of intravenous contrast. Sagittal and coronal MPR images reconstructed from axial data set.  Contrast: OMNIPAQUE IOHEXOL 300 MG/ML IV SOLN Dilute oral contrast.  Comparison: None  Findings: Minimal bibasilar atelectasis. Tiny nonspecific low attenuation focus right lobe liver 4 mm diameter image 6. Remainder of liver, spleen, pancreas, kidneys, and adrenal glands normal appearance. Stomach and appendix normal appearance. Small amount of nonspecific low attenuation free pelvic fluid. Diffusely enlarged uterus measuring 13.9 x 8.8 x 14.4 cm in size. Multiple nodules seen within uterus most likely representing multiple leiomyomata, largest exophytic at lateral right fundus, 5.5 x 5.8 x 4.6 cm. Large and small bowel loops unremarkable. No additional masses, adenopathy, or hernia. No acute osseous findings. Schmorl's nodes at superior endplates of L5 and S1.  IMPRESSION: Significantly enlarged uterus containing multiple soft tissue nodules/masses most likely representing multiple leiomyomata. Nonspecific low attenuation free pelvic fluid.  Original Report Authenticated By: Lollie Marrow, M.D.     1. Abdominal pain       MDM  8:05 PM patient seen and evaluated. Patient in no acute distress.  Right inguinal abdominal hernia felt to be reduced with constant steady pressure. Plan to send patient for CT for further evaluation and to be sure there is no persistent incarcerated hernia.  Patient reports improvement of symptoms after pain medication. CT scan without acute findings. There are signs for multiple masses in the uterus. Have discussed this with patient and offered to do pelvic exam. She has  declined and states she had a pelvic exam 3 days ago does not want a pelvic exam today. Patient does understand she needs additional followup for these findings with an OB/GYN and possible ultrasound studies. At this time we'll discharge home with pain medications.      Angus Seller, [place] 09/18/11 [phone]

## 2011-09-17 NOTE — ED Notes (Signed)
Patient c/o generalized ABD pain x2-3 days accompanied with N/V. Reports being here yesterday but felt fine to go home so she did. But now is back because she can't keep any food down and the ABD pain is unbearable.

## 2011-09-18 NOTE — ED Provider Notes (Signed)
Medical screening examination/treatment/procedure(s) were performed by non-physician practitioner and as supervising physician I was immediately available for consultation/collaboration.   Forbes Cellar, MD 09/18/11 1616

## 2011-09-18 NOTE — Discharge Instructions (Signed)
You were seen and evaluated today for your complaints of abdominal pain.  Your lab tests and CAT scan had not shown any concerning findings to explain your symptoms. A CAT scan today does show possible fibroids were masses in your uterus. You have been referred to an OB/GYN specialist for continued evaluation and treatment. You may require additional tests including ultrasound studies. Please call tomorrow to schedule followup appointment. Please also followup with the general surgeon for evaluation and treatment of possible hernia. If you develop any worsening symptoms, persistent nausea vomiting, fever, chills please return to the emergency room.       Abdominal Pain Abdominal pain can be caused by many things. Your caregiver decides the seriousness of your pain by an examination and possibly blood tests and X-rays. Many cases can be observed and treated at home. Most abdominal pain is not caused by a disease and will probably improve without treatment. However, in many cases, more time must pass before a clear cause of the pain can be found. Before that point, it may not be known if you need more testing, or if hospitalization or surgery is needed. HOME CARE INSTRUCTIONS   Do not take laxatives unless directed by your caregiver.   Take pain medicine only as directed by your caregiver.   Only take over-the-counter or prescription medicines for pain, discomfort, or fever as directed by your caregiver.   Try a clear liquid diet (broth, tea, or water) for as long as directed by your caregiver. Slowly move to a bland diet as tolerated.  SEEK IMMEDIATE MEDICAL CARE IF:   The pain does not go away.   You have a fever.   You keep throwing up (vomiting).   The pain is felt only in portions of the abdomen. Pain in the right side could possibly be appendicitis. In an adult, pain in the left lower portion of the abdomen could be colitis or diverticulitis.   You pass bloody or black tarry stools.    MAKE SURE YOU:   Understand these instructions.   Will watch your condition.   Will get help right away if you are not doing well or get worse.  Document Released: 04/10/2005 Document Revised: 06/20/2011 Document Reviewed: 02/17/2008 [facility] Patient Information 2012 [place], [place].    RESOURCE GUIDE  Dental Problems  Patients with Medicaid: [facility] [phone] [address]                                           [phone] [address] Phone:  [phone]                                                  Phone:  [phone]  If unable to pay or uninsured, contact:  [facility] or [facility]. to become qualified for the adult dental clinic.  Chronic Pain Problems Contact [facility]  [phone] Patients need to be referred by their primary care doctor.  Insufficient Money for Medicine Contact [employer] Way:  call "211" or [facility] (787)215-4384.  No Primary Care Doctor Call [facility]  [phone] Other agencies that provide inexpensive medical care    [facility]  [phone]    [facility]  [phone]    [facility]  [phone]    [facility]  [phone]    [facility]  [phone]    [facility]  [phone]  Psychological Services [facility] Behavior[facility]  [phone] [facility]  [phone] [facility]   [phone] (emergency services [phone])  Substance Abuse Resources [facility]  ([phone] [facility] [phone] [facility] [place] [phone] Floydene Flock [phone] Residential & Outpatient Substance Abuse Program  [phone]  Abuse/Neglect [facility] Child Abuse Hotline 716-533-8712 [facility] Child Abuse Hotline (743)523-9788 (After Hours)  Emergency Shelter [facility] [phone]  M[facility] Homes [facility] [place] [facility]  [phone] [facility] [phone]  MRSA Hotline #:   [phone]    [facility] Resources  [facility]     [employer]                          [facility] Dept. [address] [place]                       [address]      [address]  Blondell Reveal Phone:  [phone]                                   Phone:  [phone]                 Phone:  [phone]  [facility] Phone:  [phone]  [facility] Child Abuse Hotline 5013933524 [phone] (After Hours)

## 2012-09-09 ENCOUNTER — Inpatient Hospital Stay (HOSPITAL_COMMUNITY)
Admission: AD | Admit: 2012-09-09 | Discharge: 2012-09-10 | Disposition: A | Discharge status: Home / Self Care | Payer: Medicaid Other | Source: Ambulatory Visit | Attending: Obstetrics & Gynecology | Admitting: Obstetrics & Gynecology

## 2012-09-09 DIAGNOSIS — M549 Dorsalgia, unspecified: Secondary | ICD-10-CM | POA: Insufficient documentation

## 2012-09-09 DIAGNOSIS — M7918 Myalgia, other site: Secondary | ICD-10-CM

## 2012-09-09 DIAGNOSIS — A499 Bacterial infection, unspecified: Secondary | ICD-10-CM | POA: Insufficient documentation

## 2012-09-09 DIAGNOSIS — N760 Acute vaginitis: Secondary | ICD-10-CM | POA: Insufficient documentation

## 2012-09-09 DIAGNOSIS — B9689 Other specified bacterial agents as the cause of diseases classified elsewhere: Secondary | ICD-10-CM | POA: Insufficient documentation

## 2012-09-09 HISTORY — DX: Cyst of Bartholin's gland: N75.0

## 2012-09-09 HISTORY — DX: Unilateral inguinal hernia, without obstruction or gangrene, not specified as recurrent: K40.90

## 2012-09-10 ENCOUNTER — Encounter (HOSPITAL_COMMUNITY): Payer: Self-pay | Admitting: *Deleted

## 2012-09-10 DIAGNOSIS — N760 Acute vaginitis: Secondary | ICD-10-CM

## 2012-09-10 DIAGNOSIS — A499 Bacterial infection, unspecified: Secondary | ICD-10-CM

## 2012-09-10 LAB — WET PREP, GENITAL
Trich, Wet Prep: NONE SEEN
WBC, Wet Prep HPF POC: NONE SEEN
Yeast Wet Prep HPF POC: NONE SEEN

## 2012-09-10 LAB — URINALYSIS, ROUTINE W REFLEX MICROSCOPIC
Bilirubin Urine: NEGATIVE
Glucose, UA: NEGATIVE mg/dL
Hgb urine dipstick: NEGATIVE
Ketones, ur: NEGATIVE mg/dL
Leukocytes, UA: NEGATIVE
Nitrite: NEGATIVE
Protein, ur: NEGATIVE mg/dL
Specific Gravity, Urine: <1.005 — ABNORMAL LOW (ref 1.005–1.030)
Urobilinogen, UA: 0.2 mg/dL (ref 0.0–1.0)
pH: 6 (ref 5.0–8.0)

## 2012-09-10 LAB — POCT PREGNANCY, URINE: Preg Test, Ur: NEGATIVE

## 2012-09-10 LAB — GC/CHLAMYDIA PROBE AMP
CT Probe RNA: NEGATIVE
GC Probe RNA: NEGATIVE

## 2012-09-10 MED ORDER — METRONIDAZOLE 500 MG PO TABS: 500.0000 mg | ORAL_TABLET | Freq: Two times a day (BID) | ORAL | Status: DC

## 2012-09-10 MED ORDER — CYCLOBENZAPRINE HCL 10 MG PO TABS: 5.0000 mg | ORAL_TABLET | Freq: Three times a day (TID) | ORAL | Status: DC | PRN

## 2012-09-10 MED ORDER — METRONIDAZOLE 0.75 % VA GEL: 1.0000 | Freq: Two times a day (BID) | VAGINAL | Status: DC

## 2012-09-10 NOTE — MAU Provider Note (Signed)
Chief Complaint: Back Pain and Urinary Tract Infection   First Provider Initiated Contact with Patient 09/10/12 0030     SUBJECTIVE HPI: Carol Mack is a 42 y.o. G2P2002 non-pregnant female who presents with possible BV and bilat low to mid back pain x 3 days. Patient's last menstrual period was 09/01/2012.  Past Medical History  Diagnosis Date  . Bartholin cyst   . H/O Inguinal hernia    OB History   Grav Para Term Preterm Abortions TAB SAB Ect Mult Living   2 2 2       2      # Outc Date GA Lbr Len/2nd Wgt Sex Del Anes PTL Lv   1 TRM            2 TRM              History reviewed. No pertinent past surgical history. History   Social History  . Marital Status: Single    Spouse Name: N/A    Number of Children: N/A  . Years of Education: N/A   Occupational History  . Not on file.   Social History Main Topics  . Smoking status: Current Every Day Smoker -- 1.00 packs/day    Types: Cigarettes  . Smokeless tobacco: Not on file  . Alcohol Use: No  . Drug Use: No  . Sexually Active: Yes   Other Topics Concern  . Not on file   Social History Narrative  . No narrative on file   No current facility-administered medications on file prior to encounter.   No current outpatient prescriptions on file prior to encounter.   No Known Allergies  ROS: Pos for increased, malodorous discharge. Neg for dyspareunia, urinary complaints, GI complaints, recent injury or strenuous activity, intermenstrual bleeding, flank pain, fever, chills.   OBJECTIVE Blood pressure 131/94, pulse 87, temperature 98.9 F (37.2 C), temperature source Oral, resp. rate 18, height 5\' 4"  (1.626 m), weight 69.854 kg (154 lb), last menstrual period 09/01/2012. GENERAL: Well-developed, well-nourished female in no acute distress.  HEENT: Normocephalic HEART: normal rate RESP: normal effort ABDOMEN: Soft, non-tender BACK: Bilat low to mid and paraspinal back tenderness. No CVAT.  EXTREMITIES: Nontender,  no edema NEURO: Alert and oriented SPECULUM EXAM: NEFG except for soft, NT mass in left lower labia majora, small amount of malodorous, white discharge and clear, thick discharge, no blood noted, cervix clean BIMANUAL: cervix closed; uterus normal size, no adnexal tenderness or masses. No CMT.  LAB RESULTS Results for orders placed during the hospital encounter of 09/09/12 (from the past 24 hour(s))  URINALYSIS, ROUTINE W REFLEX MICROSCOPIC     Status: Abnormal   Collection Time    09/09/12 11:56 PM      Result Value Range   Color, Urine YELLOW  YELLOW   APPearance CLEAR  CLEAR   Specific Gravity, Urine <1.005 (*) 1.005 - 1.030   pH 6.0  5.0 - 8.0   Glucose, UA NEGATIVE  NEGATIVE mg/dL   Hgb urine dipstick NEGATIVE  NEGATIVE   Bilirubin Urine NEGATIVE  NEGATIVE   Ketones, ur NEGATIVE  NEGATIVE mg/dL   Protein, ur NEGATIVE  NEGATIVE mg/dL   Urobilinogen, UA 0.2  0.0 - 1.0 mg/dL   Nitrite NEGATIVE  NEGATIVE   Leukocytes, UA NEGATIVE  NEGATIVE  POCT PREGNANCY, URINE     Status: None   Collection Time    09/10/12 12:15 AM      Result Value Range   Preg Test, Ur NEGATIVE  NEGATIVE  WET PREP, GENITAL     Status: Abnormal   Collection Time    09/10/12 12:34 AM      Result Value Range   Yeast Wet Prep HPF POC NONE SEEN  NONE SEEN   Trich, Wet Prep NONE SEEN  NONE SEEN   Clue Cells Wet Prep HPF POC FEW (*) NONE SEEN   WBC, Wet Prep HPF POC NONE SEEN  NONE SEEN    IMAGING No results found.  MAU COURSE Declined Flexeril in MAU due to driver home.   ASSESSMENT 1. BV (bacterial vaginosis)   2. Musculoskeletal pain    PLAN Discharge home. Heat, Massage, scheduled Aleve x 3 days for back pain.      Follow-up Information   Follow up with Primary care provider or urgent care. (As needed if back pain does not improve in 3 days.)       Follow up with Roseanna Rainbow, MD. (As needed)    Contact information:   [address] [place]  [address] [phone]     GC/CT pending.    Medication List    STOP taking these medications       diphenhydramine-acetaminophen 25-500 MG Tabs  Commonly known as:  TYLENOL PM      TAKE these medications       albuterol (2.5 MG/3ML) 0.083% nebulizer solution  Commonly known as:  PROVENTIL  Take 2.5 mg by nebulization every 6 (six) hours as needed for wheezing.     cyclobenzaprine 10 MG tablet  Commonly known as:  FLEXERIL  Take 0.5-1 tablets (5-10 mg total) by mouth 3 (three) times daily as needed for muscle spasms.     metroNIDAZOLE 500 MG tablet  Commonly known as:  FLAGYL  Take 1 tablet (500 mg total) by mouth 2 (two) times daily.        [place], CNM 09/10/2012  1:11 AM

## 2012-09-10 NOTE — MAU Note (Signed)
Back pain for last 3 days. Vaginal odor for the last 5 days.

## 2013-04-18 ENCOUNTER — Encounter (HOSPITAL_COMMUNITY): Payer: Self-pay | Admitting: *Deleted

## 2013-04-18 ENCOUNTER — Inpatient Hospital Stay (HOSPITAL_COMMUNITY)
Admission: AD | Admit: 2013-04-18 | Discharge: 2013-04-18 | Disposition: A | Discharge status: Home / Self Care | Payer: Medicaid Other | Source: Ambulatory Visit | Attending: Obstetrics & Gynecology | Admitting: Obstetrics & Gynecology

## 2013-04-18 DIAGNOSIS — B9689 Other specified bacterial agents as the cause of diseases classified elsewhere: Secondary | ICD-10-CM | POA: Insufficient documentation

## 2013-04-18 DIAGNOSIS — N760 Acute vaginitis: Secondary | ICD-10-CM | POA: Insufficient documentation

## 2013-04-18 DIAGNOSIS — N949 Unspecified condition associated with female genital organs and menstrual cycle: Secondary | ICD-10-CM | POA: Insufficient documentation

## 2013-04-18 DIAGNOSIS — A499 Bacterial infection, unspecified: Secondary | ICD-10-CM

## 2013-04-18 LAB — WET PREP, GENITAL
Trich, Wet Prep: NONE SEEN
WBC, Wet Prep HPF POC: NONE SEEN
Yeast Wet Prep HPF POC: NONE SEEN

## 2013-04-18 LAB — URINALYSIS, ROUTINE W REFLEX MICROSCOPIC
Bilirubin Urine: NEGATIVE
Glucose, UA: NEGATIVE mg/dL
Hgb urine dipstick: NEGATIVE
Ketones, ur: NEGATIVE mg/dL
Leukocytes, UA: NEGATIVE
Nitrite: NEGATIVE
Protein, ur: NEGATIVE mg/dL
Specific Gravity, Urine: >1.03 — ABNORMAL HIGH (ref 1.005–1.030)
Urobilinogen, UA: 0.2 mg/dL (ref 0.0–1.0)
pH: 6 (ref 5.0–8.0)

## 2013-04-18 LAB — POCT PREGNANCY, URINE: Preg Test, Ur: NEGATIVE

## 2013-04-18 MED ORDER — METRONIDAZOLE 500 MG PO TABS: 500.0000 mg | ORAL_TABLET | Freq: Two times a day (BID) | ORAL | Status: DC

## 2013-04-18 MED ORDER — METRONIDAZOLE 0.75 % VA GEL: 1.0000 | Freq: Two times a day (BID) | VAGINAL | Status: DC

## 2013-04-18 NOTE — MAU Provider Note (Signed)
History     CSN: [id number]  Arrival date and time: 04/18/13 1643   First Provider Initiated Contact with Patient 04/18/13 1712      Chief Complaint  Patient presents with  . Vaginal Discharge   HPI  Carol Mack is a 42 y.o. non pregnant female; G2P2002 who presents with a questionable vaginal bacterial infection. She is experiencing a strong vaginal odor and abnormal discharge. She describes the discharge as white, thick; denies itching or burning. She has a history of recurring BV. She tends to hold her urine a lot throughout the night and is wondering if this is contributing to her vaginal infections.   OB History   Grav Para Term Preterm Abortions TAB SAB Ect Mult Living   2 2 2       2       Past Medical History  Diagnosis Date  . Bartholin cyst   . Hernia, inguinal   . Hernia, inguinal     Past Surgical History  Procedure Laterality Date  . No past surgeries      No family history on file.  History  Substance Use Topics  . Smoking status: Current Every Day Smoker -- 1.00 packs/day    Types: Cigarettes  . Smokeless tobacco: Not on file  . Alcohol Use: No    Allergies: No Known Allergies  Prescriptions prior to admission  Medication Sig Dispense Refill  . albuterol (PROVENTIL HFA;VENTOLIN HFA) 108 (90 BASE) MCG/ACT inhaler Inhale 2 puffs into the lungs every 6 (six) hours as needed for shortness of breath.      . Multiple Vitamin (MULTIVITAMIN WITH MINERALS) TABS tablet Take 1 tablet by mouth daily.       Results for orders placed during the hospital encounter of 04/18/13 (from the past 24 hour(s))  URINALYSIS, ROUTINE W REFLEX MICROSCOPIC     Status: Abnormal   Collection Time    04/18/13  4:55 PM      Result Value Range   Color, Urine YELLOW  YELLOW   APPearance CLEAR  CLEAR   Specific Gravity, Urine >1.030 (*) 1.005 - 1.030   pH 6.0  5.0 - 8.0   Glucose, UA NEGATIVE  NEGATIVE mg/dL   Hgb urine dipstick NEGATIVE  NEGATIVE   Bilirubin Urine  NEGATIVE  NEGATIVE   Ketones, ur NEGATIVE  NEGATIVE mg/dL   Protein, ur NEGATIVE  NEGATIVE mg/dL   Urobilinogen, UA 0.2  0.0 - 1.0 mg/dL   Nitrite NEGATIVE  NEGATIVE   Leukocytes, UA NEGATIVE  NEGATIVE  POCT PREGNANCY, URINE     Status: None   Collection Time    04/18/13  5:08 PM      Result Value Range   Preg Test, Ur NEGATIVE  NEGATIVE  WET PREP, GENITAL     Status: Abnormal   Collection Time    04/18/13  5:20 PM      Result Value Range   Yeast Wet Prep HPF POC NONE SEEN  NONE SEEN   Trich, Wet Prep NONE SEEN  NONE SEEN   Clue Cells Wet Prep HPF POC RARE (*) NONE SEEN   WBC, Wet Prep HPF POC NONE SEEN  NONE SEEN     Review of Systems  Constitutional: Negative for fever and chills.  Gastrointestinal: Negative for nausea, vomiting, abdominal pain, diarrhea and constipation.  Genitourinary: Negative for dysuria, urgency, frequency and hematuria.       + vaginal discharge. No vaginal bleeding. No dysuria.   Neurological: Negative for headaches.  Physical Exam   Blood pressure 132/96, pulse 103, temperature 98.9 F (37.2 C), resp. rate 18, last menstrual period 04/13/2013.  Physical Exam  Constitutional: She is oriented to person, place, and time. She appears well-developed and well-nourished. No distress.  Eyes: Pupils are equal, round, and reactive to light.  Neck: Neck supple.  Respiratory: Effort normal.  GI: Soft. She exhibits no distension. There is no tenderness. There is no rebound and no guarding.  Genitourinary: Uterus normal. Vaginal discharge found.  Speculum exam: Vagina - Small amount of thick, white discharge, no odor Cervix - No contact bleeding Bimanual exam: Cervix closed Uterus non tender, normal size Adnexa non tender, no masses bilaterally GC/Chlam, wet prep done Chaperone present for exam.   Neurological: She is alert and oriented to person, place, and time.  Skin: Skin is warm and dry. She is not diaphoretic.    MAU Course   Procedures. None  MDM UA UPT  Wet prep GC/Chlamydia- pending   Assessment and Plan  A: Bacterial Vaginosis- I will treat based on patient symptoms and history   P: Discharge home RX: Metrogel   Return to MAU if symptoms worsen Ok to use Replens over the counter for vaginal dryness No douching, urinate before and after intercourse    RASCH, JENNIFER IRENE FNP-C 04/18/2013, 8:51 PM

## 2013-04-18 NOTE — MAU Note (Signed)
Pt presents with complaints of having a foul odor to her vaginal discharge. She states she has a history of BV

## 2013-04-19 LAB — GC/CHLAMYDIA PROBE AMP
CT Probe RNA: NEGATIVE
GC Probe RNA: NEGATIVE

## 2013-04-19 NOTE — MAU Provider Note (Signed)
Attestation of Attending Supervision of Advanced Practitioner (CNM/NP): Evaluation and management procedures were performed by the Advanced Practitioner under my supervision and collaboration. I have reviewed the Advanced Practitioner's note and chart, and I agree with the management and plan.  Redell Nazir H. 7:11 AM   

## 2013-08-18 ENCOUNTER — Ambulatory Visit: Payer: Medicaid Other | Admitting: Physician Assistant

## 2013-11-29 ENCOUNTER — Encounter (HOSPITAL_COMMUNITY): Payer: Self-pay | Admitting: General Practice

## 2013-11-29 ENCOUNTER — Inpatient Hospital Stay (HOSPITAL_COMMUNITY)
Admission: AD | Admit: 2013-11-29 | Discharge: 2013-11-30 | Disposition: A | Discharge status: Home / Self Care | Payer: Self-pay | Source: Ambulatory Visit | Attending: Obstetrics & Gynecology | Admitting: Obstetrics & Gynecology

## 2013-11-29 DIAGNOSIS — K4090 Unilateral inguinal hernia, without obstruction or gangrene, not specified as recurrent: Secondary | ICD-10-CM | POA: Insufficient documentation

## 2013-11-29 DIAGNOSIS — R109 Unspecified abdominal pain: Secondary | ICD-10-CM | POA: Insufficient documentation

## 2013-11-29 DIAGNOSIS — D219 Benign neoplasm of connective and other soft tissue, unspecified: Secondary | ICD-10-CM

## 2013-11-29 DIAGNOSIS — F17200 Nicotine dependence, unspecified, uncomplicated: Secondary | ICD-10-CM | POA: Insufficient documentation

## 2013-11-29 DIAGNOSIS — D259 Leiomyoma of uterus, unspecified: Secondary | ICD-10-CM | POA: Insufficient documentation

## 2013-11-29 LAB — URINALYSIS, ROUTINE W REFLEX MICROSCOPIC
Bilirubin Urine: NEGATIVE
Glucose, UA: NEGATIVE mg/dL
Hgb urine dipstick: NEGATIVE
Ketones, ur: NEGATIVE mg/dL
Leukocytes, UA: NEGATIVE
Nitrite: NEGATIVE
Protein, ur: NEGATIVE mg/dL
Specific Gravity, Urine: <1.005 — ABNORMAL LOW (ref 1.005–1.030)
Urobilinogen, UA: 0.2 mg/dL (ref 0.0–1.0)
pH: 6 (ref 5.0–8.0)

## 2013-11-29 LAB — WET PREP, GENITAL
Trich, Wet Prep: NONE SEEN
Yeast Wet Prep HPF POC: NONE SEEN

## 2013-11-29 LAB — POCT PREGNANCY, URINE: Preg Test, Ur: NEGATIVE

## 2013-11-29 MED ORDER — KETOROLAC TROMETHAMINE 60 MG/2ML IM SOLN
60.0000 mg | Freq: Once | INTRAMUSCULAR | Status: AC
  Administered 2013-11-29: 60 mg via INTRAMUSCULAR
  Filled 2013-11-29: qty 2

## 2013-11-29 NOTE — MAU Provider Note (Signed)
History     CSN: [id number]  Arrival date and time: 11/29/13 1910   First Provider Initiated Contact with Patient 11/29/13 2246      Chief Complaint  Patient presents with  . Hernia   HPI  Carol Mack is a 43 y.o. [id number] who presents today with abdominal pain. She states that she has had the pain for the past few days. She has attributed this pain to a hernia. She was told that she had a hernia a few years ago, and "they pushed it back in at the hospital", and then sent her home. She states that she has not had problems since then until now. She states that she has a period each month, and it is "heavy and horrible".  Past Medical History  Diagnosis Date  . Bartholin cyst   . Hernia, inguinal   . Hernia, inguinal     Past Surgical History  Procedure Laterality Date  . No past surgeries      History reviewed. No pertinent family history.  History  Substance Use Topics  . Smoking status: Current Every Day Smoker -- 1.00 packs/day    Types: Cigarettes  . Smokeless tobacco: Not on file  . Alcohol Use: No    Allergies: No Known Allergies  Prescriptions prior to admission  Medication Sig Dispense Refill  . albuterol (PROVENTIL HFA;VENTOLIN HFA) 108 (90 BASE) MCG/ACT inhaler Inhale 2 puffs into the lungs every 6 (six) hours as needed for shortness of breath.      Marland Kitchen ibuprofen (ADVIL,MOTRIN) 200 MG tablet Take 800 mg by mouth every 6 (six) hours as needed for moderate pain.        ROS Physical Exam   Blood pressure 151/95, pulse 82, temperature 99.2 F (37.3 C), temperature source Oral, resp. rate 18, height 5\' 4"  (1.626 m), weight 69.219 kg (152 lb 9.6 oz), last menstrual period 11/22/2013, SpO2 100.00%.  Physical Exam  Nursing note and vitals reviewed. Constitutional: She is oriented to person, place, and time. She appears well-developed and well-nourished. No distress.  Cardiovascular: Normal rate.   Respiratory: Effort normal.  GI: Soft. She exhibits  distension and mass (Uterus at the umbilicus ). There is tenderness. There is no rebound and no guarding.  Genitourinary:   External: no lesion Vagina: small amount of white discharge Cervix: pink, smooth, no CMT Uterus: 20 weeks size, firm uterus  Adnexa: unable to palpate 2/2 enlarged uterus    Neurological: She is alert and oriented to person, place, and time.  Skin: Skin is warm and dry.  Psychiatric: She has a normal mood and affect.    MAU Course  Procedures  Results for orders placed during the hospital encounter of 11/29/13 (from the past 24 hour(s))  URINALYSIS, ROUTINE W REFLEX MICROSCOPIC     Status: Abnormal   Collection Time    11/29/13  8:49 PM      Result Value Ref Range   Color, Urine YELLOW  YELLOW   APPearance CLEAR  CLEAR   Specific Gravity, Urine <1.005 (*) 1.005 - 1.030   pH 6.0  5.0 - 8.0   Glucose, UA NEGATIVE  NEGATIVE mg/dL   Hgb urine dipstick NEGATIVE  NEGATIVE   Bilirubin Urine NEGATIVE  NEGATIVE   Ketones, ur NEGATIVE  NEGATIVE mg/dL   Protein, ur NEGATIVE  NEGATIVE mg/dL   Urobilinogen, UA 0.2  0.0 - 1.0 mg/dL   Nitrite NEGATIVE  NEGATIVE   Leukocytes, UA NEGATIVE  NEGATIVE  WET PREP, GENITAL  Status: Abnormal   Collection Time    11/29/13 11:01 PM      Result Value Ref Range   Yeast Wet Prep HPF POC NONE SEEN  NONE SEEN   Trich, Wet Prep NONE SEEN  NONE SEEN   Clue Cells Wet Prep HPF POC FEW (*) NONE SEEN   WBC, Wet Prep HPF POC FEW (*) NONE SEEN  POCT PREGNANCY, URINE     Status: None   Collection Time    11/29/13 11:04 PM      Result Value Ref Range   Preg Test, Ur NEGATIVE  NEGATIVE    US Pelvis Complete  11/30/2013   CLINICAL DATA:  Pelvic pain.  Enlarged uterus.  Menorrhagia.  EXAM: TRANSABDOMINAL ULTRASOUND OF PELVIS  TECHNIQUE: Transabdominal ultrasound examination of the pelvis was performed including evaluation of the uterus, ovaries, adnexal regions, and pelvic cul-de-sac.  COMPARISON:  No priors.  FINDINGS: Uterus   Measurements: 16.8 x 11.1 x 9.7 cm. Innumerable lesions of heterogeneous echotexture scattered throughout the uterus, largest of which is in the left side of the uterine body measuring up to 7.8 x 5.5 x 5.5 cm, most compatible with multiple leiomyomas.  Endometrium  Could not be visualized secondary to innumerable leiomyomas.  Right ovary  Not visualized.  Left ovary  Not visualized.  Other findings:  No free fluid  IMPRESSION: 1. Markedly enlarged fibroid uterus, as above. 2. Limited examination which was unable to visualize the ovaries transabdominally. Per report from the sonographer, the patient refused transvaginal examination.   Electronically Signed   By: Vinnie Langton M.D.   On: 11/30/2013 00:51     Assessment and Plan   1. Fibroids    Ibuprofen PRN FU in the clinic Return to MAU as needed   Follow-up Information   Follow up with [facility]. (They will call with an appointment )    Specialty:  Obstetrics and Gynecology   Contact information:   [place] [address] [place] 11/29/2013, 11:05 PM

## 2013-11-29 NOTE — MAU Note (Signed)
Known abdominal hernia; increased pain today. Vaginal discharge x 2 days; white & thin; denies vaginal irritation.

## 2013-11-30 ENCOUNTER — Inpatient Hospital Stay (HOSPITAL_COMMUNITY): Payer: Medicaid Other

## 2013-11-30 DIAGNOSIS — D259 Leiomyoma of uterus, unspecified: Secondary | ICD-10-CM

## 2013-11-30 LAB — GC/CHLAMYDIA PROBE AMP
CT Probe RNA: NEGATIVE
GC Probe RNA: NEGATIVE

## 2013-11-30 NOTE — Discharge Instructions (Signed)
Fibroids Fibroids are lumps (tumors) that can occur any place in a woman's body. These lumps are not cancerous. Fibroids vary in size, weight, and where they grow. HOME CARE  Do not take aspirin.  Write down the number of pads or tampons you use during your period. Tell your doctor. This can help determine the best treatment for you. GET HELP RIGHT AWAY IF:  You have pain in your lower belly (abdomen) that is not helped with medicine.  You have cramps that are not helped with medicine.  You have more bleeding between or during your period.  You feel lightheaded or pass out (faint).  Your lower belly pain gets worse. MAKE SURE YOU:  Understand these instructions.  Will watch your condition.  Will get help right away if you are not doing well or get worse. Document Released: 08/03/2010 Document Revised: 09/23/2011 Document Reviewed: 08/03/2010 Exit[employer] Patient Information 2014 [employer].  

## 2013-12-01 NOTE — MAU Provider Note (Signed)
Attestation of Attending Supervision of Advanced Practitioner (CNM/NP): Evaluation and management procedures were performed by the Advanced Practitioner under my supervision and collaboration. I have reviewed the Advanced Practitioner's note and chart, and I agree with the management and plan.  [place] 4:11 PM

## 2013-12-21 ENCOUNTER — Telehealth: Payer: Self-pay | Admitting: *Deleted

## 2013-12-21 NOTE — Telephone Encounter (Signed)
Carol Mack left a message she has an appt. Thurs at 2pm , wants to know what it is for. States she has to go to work- request we leave a detailed message. Called [name] and left message appt is for referral from MAU visit for pelvic pain and fibroids.  Please call if you have questions.

## 2013-12-23 ENCOUNTER — Encounter: Payer: Self-pay | Admitting: Obstetrics and Gynecology

## 2013-12-23 ENCOUNTER — Ambulatory Visit (INDEPENDENT_AMBULATORY_CARE_PROVIDER_SITE_OTHER): Payer: Self-pay | Admitting: Obstetrics and Gynecology

## 2013-12-23 ENCOUNTER — Other Ambulatory Visit (HOSPITAL_COMMUNITY)
Admission: RE | Admit: 2013-12-23 | Discharge: 2013-12-23 | Disposition: A | Discharge status: Home / Self Care | Payer: Self-pay | Source: Ambulatory Visit | Attending: Obstetrics and Gynecology | Admitting: Obstetrics and Gynecology

## 2013-12-23 VITALS — BP 123/84 | HR 82 | Temp 98.1°F | Ht 64.0 in | Wt 148.3 lb

## 2013-12-23 DIAGNOSIS — Z01818 Encounter for other preprocedural examination: Secondary | ICD-10-CM

## 2013-12-23 DIAGNOSIS — D259 Leiomyoma of uterus, unspecified: Secondary | ICD-10-CM | POA: Insufficient documentation

## 2013-12-23 LAB — POCT PREGNANCY, URINE: Preg Test, Ur: NEGATIVE

## 2013-12-23 NOTE — Progress Notes (Signed)
Subjective:    Patient ID: Carol Mack, female    DOB: 02/14/1971, 43 y.o.   MRN: [id number]  HPI 43 yo G2P2 presenting today as an MAU follow up on fibroid uterus. Patient reports noticing increasing abdominal girth over the past year. She also describes a 5 day monthly period with the first 2 days being heavy with passage of clots. She reports Carol Mack right sided pain which worsen during her cycles. Patient reports dyspareunia with the pain being located in her abdomen towards the right side.  Past Medical History  Diagnosis Date  . Bartholin cyst   . Hernia, inguinal   . Hernia, inguinal    Past Surgical History  Procedure Laterality Date  . No past surgeries     No family history on file. History  Substance Use Topics  . Smoking status: Current Every Day Smoker -- 1.00 packs/day    Types: Cigarettes  . Smokeless tobacco: Not on file  . Alcohol Use: No      Review of Systems     Objective:   Physical Exam  GENERAL: Well-developed, well-nourished female in no acute distress.  ABDOMEN: Soft, nontender, nondistended. Palpable fibroid uterus PELVIC: Normal external female genitalia. Vagina is pink and rugated.  Normal discharge. Normal appearing cervix. Uterus is 20-week in size. Adnexa difficult to evaluate secondary to fibroid uterus. EXTREMITIES: No cyanosis, clubbing, or edema, 2+ distal pulses.  FINDINGS:  Uterus  Measurements: 16.8 x 11.1 x 9.7 cm. Innumerable lesions of  heterogeneous echotexture scattered throughout the uterus, largest  of which is in the left side of the uterine body measuring up to 7.8  x 5.5 x 5.5 cm, most compatible with multiple leiomyomas.  Endometrium  Could not be visualized secondary to innumerable leiomyomas.  Right ovary  Not visualized.  Left ovary  Not visualized.  Other findings: No free fluid  IMPRESSION:  1. Markedly enlarged fibroid uterus, as above.  2. Limited examination which was unable to visualize the ovaries    transabdominally. Per report from the sonographer, the patient  refused transvaginal examination.  Electronically Signed  By: Vinnie Langton M.D.  On: 11/30/2013 00:51      Assessment & Plan:  43 yo with fibroid uterus - Discussed surgical management with hysterectomy. Patient would like to avoid surgery of possible. Will refer patient to radiology for evaluation for possible [place] - Endometrial biopsy performed today ENDOMETRIAL BIOPSY     The indications for endometrial biopsy were reviewed.   Risks of the biopsy including cramping, bleeding, infection, uterine perforation, inadequate specimen and need for additional procedures  were discussed. The patient states she understands and agrees to undergo procedure today. Consent was signed. Time out was performed. Urine HCG was negative. A sterile speculum was placed in the patient's vagina and the cervix was prepped with Betadine. A single-toothed tenaculum was placed on the anterior lip of the cervix to stabilize it. The uterine cavity was sounded to a depth of 20 cm using the uterine sound. The 3 mm pipelle was introduced into the endometrial cavity without difficulty, 2 passes were made.  A  moderate amount of tissue was  sent to pathology. The instruments were removed from the patient's vagina. Minimal bleeding from the cervix was noted. The patient tolerated the procedure well.  Routine post-procedure instructions were given to the patient. The patient will follow up in two weeks to review the results and for further management.   _ referral for screening mammogram also provided - referral for  free cervical cancer screening session also provided - RTC when ready for surgical intervention

## 2013-12-23 NOTE — Progress Notes (Signed)
Mammogram scholarship filled out and faxed to radiology-- patient informed she will be notified with an appointment. Referral to [place] at [facility] for uterine artery embolization made for 12/30/13 at 0745. Spoke to Tammy at [phone] who will also send patient reminder paper work. Patient informed of appointment date, time and location and given number to call if she needs to reschedule. Number to free pap screening given to patient and patient to call and schedule.

## 2013-12-28 ENCOUNTER — Telehealth: Payer: Self-pay

## 2013-12-28 NOTE — Telephone Encounter (Signed)
Message copied by Geanie Logan on Tue Dec 28, 2013 10:29 AM ------      Message from: CONSTANT, Vickii Chafe      Created: Tue Dec 28, 2013  9:00 AM       Please inform patient of negative endometrial biopsy results. ------

## 2013-12-28 NOTE — Telephone Encounter (Signed)
Patient informed. Patient asked when they would be calling her about her mammogram (patient states she filled out scholarship form) --informed patient they should be calling her within the next week or two but that if she doesn't hear from them she can call clinic. Patient verbalized understanding. No questions or concerns.

## 2013-12-30 ENCOUNTER — Other Ambulatory Visit (HOSPITAL_COMMUNITY): Payer: Self-pay | Admitting: Diagnostic Radiology

## 2013-12-30 ENCOUNTER — Ambulatory Visit
Admission: RE | Admit: 2013-12-30 | Discharge: 2013-12-30 | Disposition: A | Discharge status: Home / Self Care | Payer: No Typology Code available for payment source | Source: Ambulatory Visit | Attending: Obstetrics and Gynecology | Admitting: Obstetrics and Gynecology

## 2013-12-30 ENCOUNTER — Other Ambulatory Visit: Payer: Self-pay | Admitting: Obstetrics and Gynecology

## 2013-12-30 DIAGNOSIS — N920 Excessive and frequent menstruation with regular cycle: Secondary | ICD-10-CM

## 2013-12-30 DIAGNOSIS — D259 Leiomyoma of uterus, unspecified: Secondary | ICD-10-CM

## 2013-12-30 DIAGNOSIS — Z1231 Encounter for screening mammogram for malignant neoplasm of breast: Secondary | ICD-10-CM

## 2014-01-06 ENCOUNTER — Ambulatory Visit (HOSPITAL_COMMUNITY): Admission: RE | Admit: 2014-01-06 | Payer: Self-pay | Source: Ambulatory Visit

## 2014-01-11 ENCOUNTER — Ambulatory Visit (HOSPITAL_COMMUNITY): Payer: Self-pay | Attending: Obstetrics and Gynecology

## 2014-01-18 ENCOUNTER — Ambulatory Visit (HOSPITAL_COMMUNITY)
Admission: RE | Admit: 2014-01-18 | Discharge: 2014-01-18 | Disposition: A | Discharge status: Home / Self Care | Payer: Self-pay | Source: Ambulatory Visit | Attending: Obstetrics and Gynecology | Admitting: Obstetrics and Gynecology

## 2014-01-18 DIAGNOSIS — D259 Leiomyoma of uterus, unspecified: Secondary | ICD-10-CM

## 2014-02-10 ENCOUNTER — Encounter: Payer: Self-pay | Admitting: *Deleted

## 2014-02-22 ENCOUNTER — Ambulatory Visit (HOSPITAL_COMMUNITY)
Admission: RE | Admit: 2014-02-22 | Discharge: 2014-02-22 | Disposition: A | Discharge status: Home / Self Care | Payer: Self-pay | Source: Ambulatory Visit | Attending: Diagnostic Radiology | Admitting: Diagnostic Radiology

## 2014-02-22 DIAGNOSIS — N920 Excessive and frequent menstruation with regular cycle: Secondary | ICD-10-CM

## 2014-02-22 DIAGNOSIS — D259 Leiomyoma of uterus, unspecified: Secondary | ICD-10-CM | POA: Insufficient documentation

## 2014-02-22 MED ORDER — GADOBENATE DIMEGLUMINE 529 MG/ML IV SOLN
15.0000 mL | Freq: Once | INTRAVENOUS | Status: AC | PRN
  Administered 2014-02-22: 12 mL via INTRAVENOUS

## 2014-03-01 ENCOUNTER — Telehealth: Payer: Self-pay | Admitting: Emergency Medicine

## 2014-03-01 NOTE — Telephone Encounter (Signed)
lmovm for pt to make her aware that Dr Anselm Pancoast reviewed her MR and is a candidate for [place].  Asked pt to call back to let me know if she is self pay or has MD ins. Will go ahead and submit to [facility] to have them call her with date/time.

## 2014-03-04 ENCOUNTER — Emergency Department (HOSPITAL_COMMUNITY)
Admission: EM | Admit: 2014-03-04 | Discharge: 2014-03-04 | Disposition: A | Discharge status: Home / Self Care | Payer: Self-pay | Attending: Emergency Medicine | Admitting: Emergency Medicine

## 2014-03-04 ENCOUNTER — Encounter (HOSPITAL_COMMUNITY): Payer: Self-pay | Admitting: Emergency Medicine

## 2014-03-04 DIAGNOSIS — Z8742 Personal history of other diseases of the female genital tract: Secondary | ICD-10-CM | POA: Insufficient documentation

## 2014-03-04 DIAGNOSIS — F17200 Nicotine dependence, unspecified, uncomplicated: Secondary | ICD-10-CM | POA: Insufficient documentation

## 2014-03-04 DIAGNOSIS — R51 Headache: Secondary | ICD-10-CM | POA: Insufficient documentation

## 2014-03-04 DIAGNOSIS — H53149 Visual discomfort, unspecified: Secondary | ICD-10-CM | POA: Insufficient documentation

## 2014-03-04 DIAGNOSIS — Z79899 Other long term (current) drug therapy: Secondary | ICD-10-CM | POA: Insufficient documentation

## 2014-03-04 DIAGNOSIS — Z3202 Encounter for pregnancy test, result negative: Secondary | ICD-10-CM | POA: Insufficient documentation

## 2014-03-04 DIAGNOSIS — R52 Pain, unspecified: Secondary | ICD-10-CM | POA: Insufficient documentation

## 2014-03-04 DIAGNOSIS — Z8719 Personal history of other diseases of the digestive system: Secondary | ICD-10-CM | POA: Insufficient documentation

## 2014-03-04 DIAGNOSIS — M549 Dorsalgia, unspecified: Secondary | ICD-10-CM | POA: Insufficient documentation

## 2014-03-04 DIAGNOSIS — R519 Headache, unspecified: Secondary | ICD-10-CM

## 2014-03-04 DIAGNOSIS — Z7901 Long term (current) use of anticoagulants: Secondary | ICD-10-CM | POA: Insufficient documentation

## 2014-03-04 DIAGNOSIS — R112 Nausea with vomiting, unspecified: Secondary | ICD-10-CM

## 2014-03-04 LAB — POC URINE PREG, ED: Preg Test, Ur: NEGATIVE

## 2014-03-04 LAB — URINALYSIS, ROUTINE W REFLEX MICROSCOPIC
Bilirubin Urine: NEGATIVE
Glucose, UA: NEGATIVE mg/dL
Ketones, ur: NEGATIVE mg/dL
Leukocytes, UA: NEGATIVE
Nitrite: NEGATIVE
Protein, ur: NEGATIVE mg/dL
Specific Gravity, Urine: 1.016 (ref 1.005–1.030)
Urobilinogen, UA: 1 mg/dL (ref 0.0–1.0)
pH: 7 (ref 5.0–8.0)

## 2014-03-04 LAB — URINE MICROSCOPIC-ADD ON

## 2014-03-04 MED ORDER — METOCLOPRAMIDE HCL 5 MG/ML IJ SOLN
10.0000 mg | Freq: Once | INTRAMUSCULAR | Status: AC
  Administered 2014-03-04: 10 mg via INTRAVENOUS
  Filled 2014-03-04: qty 2

## 2014-03-04 MED ORDER — ONDANSETRON 4 MG PO TBDP: 4.0000 mg | ORAL_TABLET | Freq: Three times a day (TID) | ORAL | Status: DC | PRN

## 2014-03-04 MED ORDER — DIPHENHYDRAMINE HCL 50 MG/ML IJ SOLN
25.0000 mg | Freq: Once | INTRAMUSCULAR | Status: AC
  Administered 2014-03-04: 25 mg via INTRAVENOUS
  Filled 2014-03-04: qty 1

## 2014-03-04 MED ORDER — SODIUM CHLORIDE 0.9 % IV BOLUS (SEPSIS)
1000.0000 mL | Freq: Once | INTRAVENOUS | Status: AC
  Administered 2014-03-04: 1000 mL via INTRAVENOUS

## 2014-03-04 NOTE — ED Notes (Signed)
Pt states she has been vomiting since last night.  Vomited x 5.  No fever.  No urination difficulty.  No abdominal pain.  Has hx of same.  To have procedure soon for fibroids.

## 2014-03-04 NOTE — Progress Notes (Signed)
  CARE MANAGEMENT ED NOTE 03/04/2014  Patient:  [facility]   Account Number:  [id number]  Date Initiated:  03/04/2014  Documentation initiated by:  Jackelyn Poling  Subjective/Objective Assessment:   43 yr old self pay [place] she has been vomiting since last night.  Vomited x 5.  No fever.  No urination difficulty.  No abdominal pain.  Has hx of same.  To have procedure soon for fibroids.     Subjective/Objective Assessment Detail:   She states she called Imaging back to answer questions for her upcoming procedure with Dr Anselm Pancoast for UFE     Action/Plan:   ED CM spoke with pt about importance of a pcp and remaining with women's clinic CM note a noted from [facility] imaging from 03/01/14 Discussed with pt Provided with list of self pay resources   Action/Plan Detail:   Anticipated DC Date:  03/04/2014     Status Recommendation to Physician:   Result of Recommendation:    Other ED [place]  Other  PCP issues  Outpatient Services - Pt will follow up    Choice offered to / List presented to:            Status of service:  Completed, signed off  ED Comments:   ED Comments Detail:  CM spoke with pt who confirms self pay [facility] resident with no pcp. CM discussed and provided written information for self pay pcps, importance of pcp for f/u care, www.needymeds.org, discounted pharmacies and other [employer] such as [employer], affordable care act,  financial assistance, DSS and  health department Reviewed resources for [employer] self pay pcps like [facility] Blossom, family medicine at [address], [facility] family practice, general medical clinics, [facility] urgent care plus others, medication resources, [facility] out patient pharmacies and housing Pt voiced understanding and appreciation of resources provided  Provided [facility] contact information

## 2014-03-04 NOTE — ED Provider Notes (Signed)
Medical screening examination/treatment/procedure(s) were performed by non-physician practitioner and as supervising physician I was immediately available for consultation/collaboration.   EKG Interpretation None        Wandra Arthurs, MD 03/04/14 2348

## 2014-03-04 NOTE — ED Provider Notes (Signed)
CSN: [id number]     Arrival date & time 03/04/14  1438 History   First MD Initiated Contact with Patient 03/04/14 1708     Chief Complaint  Patient presents with  . Emesis  . Headache     (Consider location/radiation/quality/duration/timing/severity/associated sxs/prior Treatment) HPI Comments: Patient with history of headaches, uterine fibroids which cause her pain -- presents with complaint of vomiting that began last evening. Vomiting was nonbloody, nonbilious. She vomited approximately 5 times, the last time being about 2 AM today. No significant new abdominal pain. She has remained nausea since that time. After the vomiting started patient developed a left sided throbbing headache with some radiation to the right side of her head. She has had photophobia and phonophobia. Headache is not made worse with positions. She does not have neck pain or fever. No head injury. She has not had diarrhea. Patient has had similar headaches in the past and has sought medical attention for these headaches in the past. She tried to take ibuprofen for her headache however she vomited after taking it. No chest pain or cough. Patient thinks that she may have a urinary tract infection but denies having any dysuria, hematuria, frequency. She has back pain and lower abdominal pain and that is the same as her previous pain due to fibroids. She is due to have the procedure on these fibroids in the near future.  The history is provided by the patient.    Past Medical History  Diagnosis Date  . Bartholin cyst   . Hernia, inguinal   . Hernia, inguinal    Past Surgical History  Procedure Laterality Date  . No past surgeries     History reviewed. No pertinent family history. History  Substance Use Topics  . Smoking status: Current Every Day Smoker -- 1.00 packs/day    Types: Cigarettes  . Smokeless tobacco: Not on file  . Alcohol Use: No   OB History   Grav Para Term Preterm Abortions TAB SAB Ect Mult  Living   2 2 2       2      Review of Systems  Constitutional: Negative for fever.  HENT: Negative for congestion, dental problem, rhinorrhea, sinus pressure and sore throat.   Eyes: Negative for photophobia, discharge, redness and visual disturbance.  Respiratory: Negative for cough and shortness of breath.   Cardiovascular: Negative for chest pain.  Gastrointestinal: Positive for nausea and vomiting. Negative for abdominal pain, diarrhea and blood in stool.  Genitourinary: Negative for dysuria, frequency, vaginal bleeding and vaginal discharge.  Musculoskeletal: Positive for back pain (chronic). Negative for gait problem, myalgias, neck pain and neck stiffness.  Skin: Negative for rash.  Neurological: Positive for headaches. Negative for syncope, speech difficulty, weakness, light-headedness and numbness.  Psychiatric/Behavioral: Negative for confusion.    Allergies  Review of patient's allergies indicates no known allergies.  Home Medications   Prior to Admission medications   Medication Sig Start Date End Date Taking? Authorizing Provider  albuterol (PROVENTIL HFA;VENTOLIN HFA) 108 (90 BASE) MCG/ACT inhaler Inhale 2 puffs into the lungs every 6 (six) hours as needed for shortness of breath.   Yes Historical Provider, MD  ibuprofen (ADVIL,MOTRIN) 200 MG tablet Take 800 mg by mouth every 6 (six) hours as needed for moderate pain.   Yes Historical Provider, MD   BP 161/93  Pulse 56  Temp(Src) 98.7 F (37.1 C) (Oral)  Resp 20  SpO2 99%  LMP 02/27/2014  Physical Exam  Nursing note and vitals reviewed.  Constitutional: She is oriented to person, place, and time. She appears well-developed and well-nourished.  HENT:  Head: Normocephalic and atraumatic.  Right Ear: Tympanic membrane, external ear and ear canal normal.  Left Ear: Tympanic membrane, external ear and ear canal normal.  Nose: Nose normal.  Mouth/Throat: Uvula is midline, oropharynx is clear and moist and mucous  membranes are normal.  Eyes: Conjunctivae, EOM and lids are normal. Pupils are equal, round, and reactive to light. Right eye exhibits no nystagmus. Left eye exhibits no nystagmus.  Neck: Normal range of motion. Neck supple.  Cardiovascular: Normal rate and regular rhythm.   No murmur heard. Pulmonary/Chest: Effort normal and breath sounds normal. No respiratory distress. She has no wheezes. She has no rales.  Abdominal: Soft. Bowel sounds are normal. There is no tenderness. There is no rebound and no guarding.  Musculoskeletal:       Cervical back: She exhibits normal range of motion, no tenderness and no bony tenderness.  Neurological: She is alert and oriented to person, place, and time. She has normal strength and normal reflexes. No cranial nerve deficit or sensory deficit. She displays a negative Romberg sign. Coordination and gait normal. GCS eye subscore is 4. GCS verbal subscore is 5. GCS motor subscore is 6.  Skin: Skin is warm and dry.  Psychiatric: She has a normal mood and affect.    ED Course  Procedures (including critical care time) Labs Review Labs Reviewed  URINALYSIS, ROUTINE W REFLEX MICROSCOPIC - Abnormal; Notable for the following:    Hgb urine dipstick TRACE (*)    All other components within normal limits  URINE MICROSCOPIC-ADD ON - Abnormal; Notable for the following:    Squamous Epithelial / LPF FEW (*)    Bacteria, UA FEW (*)    All other components within normal limits  POC URINE PREG, ED  POC URINE PREG, ED    Imaging Review No results found.   EKG Interpretation None      5:16 PM Patient seen and examined. Work-up initiated. Medications ordered.   Vital signs reviewed and are as follows: BP 161/93  Pulse 56  Temp(Src) 98.7 F (37.1 C) (Oral)  Resp 20  SpO2 99%  LMP 02/27/2014  7:52 PM Patient re-examined. She states that her HA is slowly improving and resolving. She has tolerated PO fluids and crackers without vomiting. Neuro exam is  normal and unchanged.   Will d/c to home. Counseled on brat diet.   Patient urged to return with worsening symptoms or other concerns. Return with severe worsening headache, vision changes, confusion, loss of consciousness, trouble walking, nausea & vomiting, or weakness/tingling in extremities.  Patient verbalized understanding and agrees with plan.     MDM   Final diagnoses:  Non-intractable vomiting with nausea, vomiting of unspecified type  Acute nonintractable headache, unspecified headache type   N/V: vomiting resolved > 12 hrs ago, nausea improved in ED with antiemetics, UA/UPT neg. No abd pain. ? Gastritis.   HA: Patient without high-risk features of headache including: sudden onset/thunderclap HA, no similar headache in past, altered mental status, accompanying seizure, headache with exertion, age > 5, history of immunocompromise, neck or shoulder pain, fever, use of anticoagulation, family history of spontaneous SAH, concomitant drug use, toxic exposure.   Patient has a normal complete neurological exam, normal vital signs, normal level of consciousness, no signs of meningismus, is well-appearing/non-toxic appearing, no papilledema, no signs of trauma, no pain over the temporal arteries.   Imaging with CT/MRI not  indicated given history and physical exam findings.   No dangerous or life-threatening conditions suspected or identified by history, physical exam, and by work-up. No indications for hospitalization identified.      Carlisle Cater, PA-C 03/04/14 1954

## 2014-03-04 NOTE — Discharge Instructions (Signed)
Please read and follow all provided instructions.  Your diagnoses today include:  1. Non-intractable vomiting with nausea, vomiting of unspecified type   2. Acute nonintractable headache, unspecified headache type    Tests performed today include:  Urine pregnancy and urine test - no signs of infection  Vital signs. See below for your results today.   Medications:  In the Emergency Department you received:  Reglan - antinausea/headache medication  Benadryl - antihistamine to counteract potential side effects of reglan  Take any prescribed medications only as directed.  Additional information:  Follow any educational materials contained in this packet.  You are having a headache. No specific cause was found today for your headache. It may have been a migraine or other cause of headache. Stress, anxiety, fatigue, and depression are common triggers for headaches.   Your headache today does not appear to be life-threatening or require hospitalization, but often the exact cause of headaches is not determined in the emergency department. Therefore, follow-up with your doctor is very important to find out what may have caused your headache and whether or not you need any further diagnostic testing or treatment.   Sometimes headaches can appear benign (not harmful), but then more serious symptoms can develop which should prompt an immediate re-evaluation by your doctor or the emergency department.  BE VERY CAREFUL not to take multiple medicines containing Tylenol (also called acetaminophen). Doing so can lead to an overdose which can damage your liver and cause liver failure and possibly death.   Follow-up instructions: Please follow-up with your primary care provider in the next 7 days for further evaluation of your symptoms.   Return instructions:   Please return to the Emergency Department if you experience worsening symptoms.  Return if the medications do not resolve your headache,  if it recurs, or if you have multiple episodes of vomiting or cannot keep down fluids.  Return if you have a change from the usual headache.  RETURN IMMEDIATELY IF you:  Develop a sudden, severe headache  Develop confusion or become poorly responsive or faint  Develop a fever above 100.11F or problem breathing  Have a change in speech, vision, swallowing, or understanding  Develop new weakness, numbness, tingling, incoordination in your arms or legs  Have a seizure  Please return if you have any other emergent concerns.  Additional Information:  Your vital signs today were: BP 145/89   Pulse 72   Temp(Src) 98.2 F (36.8 C) (Oral)   Resp 16   SpO2 100%   LMP 02/27/2014 If your blood pressure (BP) was elevated above 135/85 this visit, please have this repeated by your doctor within one month. --------------

## 2014-03-08 ENCOUNTER — Encounter: Payer: Self-pay | Admitting: General Practice

## 2014-03-17 ENCOUNTER — Other Ambulatory Visit: Payer: Self-pay | Admitting: Diagnostic Radiology

## 2014-03-17 DIAGNOSIS — D259 Leiomyoma of uterus, unspecified: Secondary | ICD-10-CM

## 2014-03-29 ENCOUNTER — Encounter: Payer: Self-pay | Admitting: Family Medicine

## 2014-05-16 ENCOUNTER — Encounter (HOSPITAL_COMMUNITY): Payer: Self-pay | Admitting: Emergency Medicine

## 2014-05-27 ENCOUNTER — Encounter: Payer: Self-pay | Admitting: Emergency Medicine

## 2014-05-30 ENCOUNTER — Other Ambulatory Visit: Payer: Self-pay | Admitting: Radiology

## 2014-06-01 ENCOUNTER — Other Ambulatory Visit: Payer: Self-pay | Admitting: Radiology

## 2014-06-02 ENCOUNTER — Ambulatory Visit (HOSPITAL_COMMUNITY)
Admission: RE | Admit: 2014-06-02 | Discharge: 2014-06-02 | Disposition: A | Discharge status: Home / Self Care | Payer: Self-pay | Source: Ambulatory Visit | Attending: Diagnostic Radiology | Admitting: Diagnostic Radiology

## 2014-06-02 ENCOUNTER — Observation Stay (HOSPITAL_COMMUNITY)
Admission: RE | Admit: 2014-06-02 | Discharge: 2014-06-03 | Disposition: A | Discharge status: Home / Self Care | Payer: Self-pay | Source: Ambulatory Visit | Attending: Diagnostic Radiology | Admitting: Diagnostic Radiology

## 2014-06-02 DIAGNOSIS — N920 Excessive and frequent menstruation with regular cycle: Secondary | ICD-10-CM | POA: Insufficient documentation

## 2014-06-02 DIAGNOSIS — D259 Leiomyoma of uterus, unspecified: Principal | ICD-10-CM | POA: Insufficient documentation

## 2014-06-02 DIAGNOSIS — F17200 Nicotine dependence, unspecified, uncomplicated: Secondary | ICD-10-CM | POA: Insufficient documentation

## 2014-06-02 DIAGNOSIS — B373 Candidiasis of vulva and vagina: Secondary | ICD-10-CM | POA: Insufficient documentation

## 2014-06-02 DIAGNOSIS — N852 Hypertrophy of uterus: Secondary | ICD-10-CM | POA: Insufficient documentation

## 2014-06-02 DIAGNOSIS — R112 Nausea with vomiting, unspecified: Secondary | ICD-10-CM | POA: Insufficient documentation

## 2014-06-02 DIAGNOSIS — D219 Benign neoplasm of connective and other soft tissue, unspecified: Secondary | ICD-10-CM | POA: Diagnosis present

## 2014-06-02 LAB — CBC WITH DIFFERENTIAL/PLATELET
Basophils Absolute: 0 10*3/uL (ref 0.0–0.1)
Basophils Relative: 0 % (ref 0–1)
Eosinophils Absolute: 0.1 10*3/uL (ref 0.0–0.7)
Eosinophils Relative: 1 % (ref 0–5)
HCT: 25.9 % — ABNORMAL LOW (ref 36.0–46.0)
Hemoglobin: 8.6 g/dL — ABNORMAL LOW (ref 12.0–15.0)
Lymphocytes Relative: 49 % — ABNORMAL HIGH (ref 12–46)
Lymphs Abs: 2.7 10*3/uL (ref 0.7–4.0)
MCH: 17.6 pg — ABNORMAL LOW (ref 26.0–34.0)
MCHC: 33.2 g/dL (ref 30.0–36.0)
MCV: 52.9 fL — ABNORMAL LOW (ref 78.0–100.0)
Monocytes Absolute: 0.7 10*3/uL (ref 0.1–1.0)
Monocytes Relative: 12 % (ref 3–12)
Neutro Abs: 2.1 10*3/uL (ref 1.7–7.7)
Neutrophils Relative %: 38 % — ABNORMAL LOW (ref 43–77)
Platelets: 297 10*3/uL (ref 150–400)
RBC: 4.9 MIL/uL (ref 3.87–5.11)
RDW: 18.6 % — ABNORMAL HIGH (ref 11.5–15.5)
WBC: 5.6 10*3/uL (ref 4.0–10.5)

## 2014-06-02 LAB — BASIC METABOLIC PANEL
Anion gap: 13 (ref 5–15)
BUN: 16 mg/dL (ref 6–23)
CO2: 20 mEq/L (ref 19–32)
Calcium: 9 mg/dL (ref 8.4–10.5)
Chloride: 106 mEq/L (ref 96–112)
Creatinine, Ser: 0.7 mg/dL (ref 0.50–1.10)
GFR calc Af Amer: >90 mL/min (ref >90)
GFR calc non Af Amer: >90 mL/min (ref >90)
Glucose, Bld: 97 mg/dL (ref 70–99)
Potassium: 4.2 mEq/L (ref 3.7–5.3)
Sodium: 139 mEq/L (ref 137–147)

## 2014-06-02 LAB — PROTIME-INR
INR: 0.99 (ref 0.00–1.49)
Prothrombin Time: 13.2 seconds (ref 11.6–15.2)

## 2014-06-02 LAB — HCG, SERUM, QUALITATIVE: Preg, Serum: NEGATIVE

## 2014-06-02 MED ORDER — SODIUM CHLORIDE 0.9 % IV SOLN
INTRAVENOUS | Status: DC
  Administered 2014-06-02: 08:00:00 via INTRAVENOUS

## 2014-06-02 MED ORDER — KETOROLAC TROMETHAMINE 30 MG/ML IJ SOLN
30.0000 mg | Freq: Four times a day (QID) | INTRAMUSCULAR | Status: DC
  Administered 2014-06-02 – 2014-06-03 (×4): 30 mg via INTRAVENOUS
  Filled 2014-06-02 (×7): qty 1

## 2014-06-02 MED ORDER — ONDANSETRON HCL 4 MG/2ML IJ SOLN
4.0000 mg | Freq: Four times a day (QID) | INTRAMUSCULAR | Status: DC | PRN
  Filled 2014-06-02: qty 2

## 2014-06-02 MED ORDER — MIDAZOLAM HCL 2 MG/2ML IJ SOLN
INTRAMUSCULAR | Status: AC | PRN
  Administered 2014-06-02 (×2): 0.5 mg via INTRAVENOUS
  Administered 2014-06-02 (×3): 1 mg via INTRAVENOUS

## 2014-06-02 MED ORDER — DEXAMETHASONE SODIUM PHOSPHATE 10 MG/ML IJ SOLN: 10.0000 mg | Freq: Once | INTRAMUSCULAR | Status: DC

## 2014-06-02 MED ORDER — HYDROMORPHONE 0.3 MG/ML IV SOLN
INTRAVENOUS | Status: DC
  Administered 2014-06-02: 3 mg via INTRAVENOUS
  Administered 2014-06-02: 0.3 mg via INTRAVENOUS
  Administered 2014-06-02: 13:00:00 via INTRAVENOUS
  Administered 2014-06-03: 0.3 mg via INTRAVENOUS
  Administered 2014-06-03: 0.9 mg via INTRAVENOUS

## 2014-06-02 MED ORDER — PROMETHAZINE HCL 25 MG PO TABS: 25.0000 mg | ORAL_TABLET | Freq: Three times a day (TID) | ORAL | Status: DC | PRN

## 2014-06-02 MED ORDER — DOCUSATE SODIUM 100 MG PO CAPS
100.0000 mg | ORAL_CAPSULE | Freq: Two times a day (BID) | ORAL | Status: DC
  Administered 2014-06-03: 100 mg via ORAL
  Filled 2014-06-02 (×3): qty 1

## 2014-06-02 MED ORDER — IOHEXOL 300 MG/ML  SOLN
80.0000 mL | Freq: Once | INTRAMUSCULAR | Status: AC | PRN
  Administered 2014-06-02: 140 mL via INTRA_ARTERIAL

## 2014-06-02 MED ORDER — FENTANYL CITRATE 0.05 MG/ML IJ SOLN
INTRAMUSCULAR | Status: AC
  Filled 2014-06-02: qty 6

## 2014-06-02 MED ORDER — HYDROMORPHONE HCL 1 MG/ML IJ SOLN
INTRAMUSCULAR | Status: AC | PRN
  Administered 2014-06-02: 1 mg via INTRAVENOUS

## 2014-06-02 MED ORDER — NALOXONE HCL 0.4 MG/ML IJ SOLN: 0.4000 mg | INTRAMUSCULAR | Status: DC | PRN

## 2014-06-02 MED ORDER — DIPHENHYDRAMINE HCL 12.5 MG/5ML PO ELIX
12.5000 mg | ORAL_SOLUTION | Freq: Four times a day (QID) | ORAL | Status: DC | PRN
  Filled 2014-06-02: qty 5

## 2014-06-02 MED ORDER — ONDANSETRON HCL 4 MG/2ML IJ SOLN
INTRAMUSCULAR | Status: AC
  Filled 2014-06-02: qty 2

## 2014-06-02 MED ORDER — KETOROLAC TROMETHAMINE 30 MG/ML IJ SOLN
30.0000 mg | Freq: Once | INTRAMUSCULAR | Status: AC
  Administered 2014-06-02: 30 mg via INTRAVENOUS

## 2014-06-02 MED ORDER — MIDAZOLAM HCL 2 MG/2ML IJ SOLN
INTRAMUSCULAR | Status: AC
Start: 2014-06-02 — End: 2014-06-02
  Filled 2014-06-02: qty 6

## 2014-06-02 MED ORDER — ONDANSETRON HCL 4 MG/2ML IJ SOLN
4.0000 mg | Freq: Four times a day (QID) | INTRAMUSCULAR | Status: DC | PRN
  Administered 2014-06-02 – 2014-06-03 (×2): 4 mg via INTRAVENOUS
  Filled 2014-06-02: qty 2

## 2014-06-02 MED ORDER — HYDROMORPHONE HCL 2 MG/ML IJ SOLN
INTRAMUSCULAR | Status: AC
  Filled 2014-06-02: qty 1

## 2014-06-02 MED ORDER — SODIUM CHLORIDE 0.9 % IJ SOLN: 3.0000 mL | INTRAMUSCULAR | Status: DC | PRN

## 2014-06-02 MED ORDER — DIPHENHYDRAMINE HCL 50 MG/ML IJ SOLN: 12.5000 mg | Freq: Four times a day (QID) | INTRAMUSCULAR | Status: DC | PRN

## 2014-06-02 MED ORDER — CEFAZOLIN SODIUM-DEXTROSE 2-3 GM-% IV SOLR
2.0000 g | Freq: Once | INTRAVENOUS | Status: AC
  Administered 2014-06-02: 2 g via INTRAVENOUS

## 2014-06-02 MED ORDER — SODIUM CHLORIDE 0.9 % IJ SOLN: 9.0000 mL | INTRAMUSCULAR | Status: DC | PRN

## 2014-06-02 MED ORDER — SODIUM CHLORIDE 0.9 % IJ SOLN: 3.0000 mL | Freq: Two times a day (BID) | INTRAMUSCULAR | Status: DC

## 2014-06-02 MED ORDER — FENTANYL CITRATE 0.05 MG/ML IJ SOLN
INTRAMUSCULAR | Status: AC | PRN
  Administered 2014-06-02: 50 ug via INTRAVENOUS
  Administered 2014-06-02: 12.5 ug via INTRAVENOUS
  Administered 2014-06-02: 50 ug via INTRAVENOUS
  Administered 2014-06-02: 12.5 ug via INTRAVENOUS
  Administered 2014-06-02: 50 ug via INTRAVENOUS

## 2014-06-02 MED ORDER — HYDROMORPHONE 0.3 MG/ML IV SOLN
INTRAVENOUS | Status: AC
  Filled 2014-06-02: qty 25

## 2014-06-02 MED ORDER — KETOROLAC TROMETHAMINE 30 MG/ML IJ SOLN
INTRAMUSCULAR | Status: AC
  Filled 2014-06-02: qty 1

## 2014-06-02 MED ORDER — PREDNISONE 20 MG PO TABS
20.0000 mg | ORAL_TABLET | Freq: Every day | ORAL | Status: AC
  Administered 2014-06-03: 20 mg via ORAL
  Filled 2014-06-02: qty 1

## 2014-06-02 MED ORDER — CEFAZOLIN SODIUM-DEXTROSE 2-3 GM-% IV SOLR
INTRAVENOUS | Status: AC
  Filled 2014-06-02: qty 50

## 2014-06-02 MED ORDER — SODIUM CHLORIDE 0.9 % IV SOLN: 250.0000 mL | INTRAVENOUS | Status: DC | PRN

## 2014-06-02 MED ORDER — PROMETHAZINE HCL 25 MG RE SUPP: 25.0000 mg | Freq: Three times a day (TID) | RECTAL | Status: DC | PRN

## 2014-06-02 MED ORDER — ALBUTEROL SULFATE (2.5 MG/3ML) 0.083% IN NEBU: 2.5000 mg | INHALATION_SOLUTION | Freq: Four times a day (QID) | RESPIRATORY_TRACT | Status: DC | PRN

## 2014-06-02 MED ORDER — ONDANSETRON HCL 4 MG/2ML IJ SOLN
INTRAMUSCULAR | Status: AC | PRN
  Administered 2014-06-02: 4 mg via INTRAVENOUS

## 2014-06-02 MED ORDER — LIDOCAINE HCL 1 % IJ SOLN
INTRAMUSCULAR | Status: AC
  Filled 2014-06-02: qty 20

## 2014-06-02 MED ORDER — DEXAMETHASONE SODIUM PHOSPHATE 10 MG/ML IJ SOLN
20.0000 mg | Freq: Once | INTRAMUSCULAR | Status: AC
  Administered 2014-06-02: 20 mg via INTRAVENOUS
  Filled 2014-06-02: qty 2

## 2014-06-02 NOTE — Discharge Instructions (Signed)
Uterine Artery Embolization for Fibroids Uterine artery embolization is a nonsurgical treatment to shrink fibroids. A thin plastic tube (catheter) is used to inject material that blocks off the blood supply to the fibroid, which causes the fibroid to shrink. LET [facility] CARE PROVIDER KNOW ABOUT:  Any allergies you have.  All medicines you are taking, including vitamins, herbs, eye drops, creams, and over-the-counter medicines.  Previous problems you or members of your family have had with the use of anesthetics.  Any blood disorders you have.  Previous surgeries you have had.  Medical conditions you have. RISKS AND COMPLICATIONS  Injury to the uterus from decreased blood supply  Infection.  Blood infection (septicemia).  Lack of menstrual periods (amenorrhea).  Death of tissue cells (necrosis) around your bladder or vulva.  Development of a hole between organs or from an organ to the surface of your skin (fistula).  Blood clot in the legs (deep vein thrombosis) or lung (pulmonary embolus). BEFORE THE PROCEDURE  Ask your health care provider about changing or stopping your regular medicines.   Do not take aspirin or blood thinners (anticoagulants) for 1 week before the surgery or as directed by your health care provider.  Do not eat or drink anything for 8 hours before the surgery or as directed by your health care provider.   Empty your bladder before the procedure begins. PROCEDURE   An IV tube will be placed into one of your veins. This will be used to give you a sedative and pain medication (conscious sedation).  You will be given a medicine that numbs the area (local anesthetic).  A small cut will be made in your groin. A catheter is then inserted into the main artery of your leg.  The catheter will be guided through the artery to your uterus. A series of images will be taken while dye is injected through the catheter in your groin. X-rays are taken at the  same time. This is done to provide a road map of the blood supply to your uterus and fibroids.  Tiny plastic spheres, about the size of sand grains, will be injected through the catheter. Metal coils may be used to help block the artery. The particles will lodge in tiny branches of the uterine artery that supplies blood to the fibroids.  The procedure is repeated on the artery that supplies the other side of the uterus.  The catheter is then removed and pressure is held to stop any bleeding. No stitches are needed.  A dressing is then placed over the cut (incision). AFTER THE PROCEDURE  You will be taken to a recovery area where your progress will be monitored until you are awake, stable, and taking fluids well. If there are no other problems, you will then be moved to a regular hospital room.  You will be observed overnight in the hospital.  You will have cramping that should be controlled with pain medication. Document Released: 09/16/2005 Document Revised: 04/21/2013 Document Reviewed: 01/14/2013 [facility] Patient Information 2015 [place], [place]. This information is not intended to replace advice given to you by your health care provider. Make sure you discuss any questions you have with your health care provider.

## 2014-06-02 NOTE — Progress Notes (Addendum)
Day of Surgery  Subjective:  Pt resting quietly ; no acute issues at present per nurse ; a little lethargic from dilaudid  Objective: Vital signs in last 24 hours: Temp:  [97.4 F (36.3 C)-98.2 F (36.8 C)] 97.4 F (36.3 C) (11/19 1443) Pulse Rate:  [66-85] 71 (11/19 1443) Resp:  [15-29] 15 (11/19 1537) BP: (111-165)/(70-105) 151/84 mmHg (11/19 1443) SpO2:  [89 %-100 %] 100 % (11/19 1537) Weight:  [140 lb (63.504 kg)] 140 lb (63.504 kg) (11/19 0748)    Intake/Output from previous day:   Intake/Output this shift: Total I/O In: -  Out: 800 [Urine:800]  Pt lethargic; chest- CTA bilat; heart- RRR; abd- soft, few BS, fibroid uterus, mild tenderness; puncture site rt CFA clean and dry,NT,no hematoma  Lab Results:   Recent Labs  06/02/14 0805  WBC 5.6  HGB 8.6*  HCT 25.9*  PLT 297   BMET  Recent Labs  06/02/14 0805  NA 139  K 4.2  CL 106  CO2 20  GLUCOSE 97  BUN 16  CREATININE 0.70  CALCIUM 9.0   PT/INR  Recent Labs  06/02/14 0805  LABPROT 13.2  INR 0.99   ABG No results for input(s): PHART, HCO3 in the last 72 hours.  Invalid input(s): PCO2, PO2  Studies/Results: No results found.  Anti-infectives: Anti-infectives    Start     Dose/Rate Route Frequency Ordered Stop   06/02/14 0848  ceFAZolin (ANCEF) 2-3 GM-% IVPB SOLR    Comments:  Duininck, Stacey   : cabinet override      06/02/14 0848 06/02/14 2059   06/02/14 0745  ceFAZolin (ANCEF) IVPB 2 g/50 mL premix     2 g100 mL/hr over 30 Minutes Intravenous  Once 06/02/14 0732 06/02/14 1051      Assessment/Plan: s/p bilateral [place] secondary to symptomatic uterine fibroids 11/19; for overnight obs; PCA dilaudid for pain control; f/u with Dr. Anselm Pancoast in 2 weeks in [place] [facility]  LOS: 0 days    [place] Wenzlick,D [facility] 06/02/2014

## 2014-06-02 NOTE — Progress Notes (Signed)
Paged to Dr. Anselm Pancoast. Who wants IV Toraol given by Marzetta Board in IR prior to start of procedure.

## 2014-06-02 NOTE — Procedures (Signed)
Post-Procedure Note  Pre-operative Diagnosis:Uterine fibroids and menorrhagia and abdominal pain       Post-operative Diagnosis: Enlarged uterus with fibroids   Indications: Symptomatic fibroids  Procedure Details:   Bilateral uterine artery embolization was performed with particles.  No immediate complication.  See Radiology procedure note.  Findings: Enlarged uterus and enlarged uterine arteries.  Successful embolization of uterine arteries  Complications: No immediate     Condition: Stable  Plan: Overnight observation for pain control.

## 2014-06-02 NOTE — Progress Notes (Signed)
Patient arrived to unit via bed. Femoral site assessed by Probation officer and Marzetta Board. Dressing is without drainage and intact. Patient encouraged to use PCA, resting comfortably at this time with no complaints.

## 2014-06-02 NOTE — H&P (Signed)
Chief Complaint: Uterine fibroids and menorrhagia  Referring Physician(s): Dr. Rosendo Gros  History of Present Illness: Carol Mack is a 43 y.o. female with history of symptomatic uterine fibroids who presents today for elective bilateral uterine artery embolization.  Past Medical History  Diagnosis Date  . Bartholin cyst   . Hernia, inguinal   . Hernia, inguinal     Past Surgical History  Procedure Laterality Date  . No past surgeries      Allergies: Review of patient's allergies indicates no known allergies.  Medications: Prior to Admission medications   Medication Sig Start Date End Date Taking? Authorizing Provider  albuterol (PROVENTIL HFA;VENTOLIN HFA) 108 (90 BASE) MCG/ACT inhaler Inhale 2 puffs into the lungs every 6 (six) hours as needed for shortness of breath.   Yes Historical Provider, MD  ibuprofen (ADVIL,MOTRIN) 200 MG tablet Take 800 mg by mouth every 6 (six) hours as needed for moderate pain.   Yes Historical Provider, MD  ondansetron (ZOFRAN ODT) 4 MG disintegrating tablet Take 1 tablet (4 mg total) by mouth every 8 (eight) hours as needed for nausea or vomiting. 03/04/14   Carlisle Cater, PA-C    No family history on file.  History   Social History  . Marital Status: Single    Spouse Name: N/A    Number of Children: N/A  . Years of Education: N/A   Social History Main Topics  . Smoking status: Current Every Day Smoker -- 1.00 packs/day    Types: Cigarettes  . Smokeless tobacco: Not on file  . Alcohol Use: No  . Drug Use: No  . Sexual Activity: Yes    Birth Control/ Protection: None   Other Topics Concern  . Not on file   Social History Narrative         Review of Systems  Constitutional: Negative for fever and chills.  Respiratory:       Occ cough/dyspnea with exertion  Cardiovascular: Negative for chest pain.  Gastrointestinal: Positive for abdominal pain. Negative for vomiting and blood in stool.       Occ nausea    Genitourinary: Positive for vaginal bleeding, menstrual problem, pelvic pain and dyspareunia. Negative for dysuria and hematuria.  Musculoskeletal: Positive for back pain and neck pain.  Neurological: Positive for headaches.  Hematological: Does not bruise/bleed easily.    Vital Signs: BP 117/76 mmHg  Pulse 81  Temp(Src) 98.2 F (36.8 C) (Oral)  Resp 16  Ht 5\' 4"  (1.626 m)  Wt 140 lb (63.504 kg)  BMI 24.02 kg/m2  SpO2 100%  LMP 05/15/2014 (Approximate)  Physical Exam  Constitutional: She is oriented to person, place, and time. She appears well-developed and well-nourished.  Cardiovascular: Normal rate and regular rhythm.   Pulmonary/Chest: Effort normal and breath sounds normal.  Abdominal: Soft. Bowel sounds are normal.  Fibroid uterus, mod tender to palpation  Musculoskeletal: Normal range of motion. She exhibits no edema.  Neurological: She is alert and oriented to person, place, and time.    Imaging: No results found.  Labs:  CBC:  Recent Labs  06/02/14 0805  WBC 5.6  HGB 8.6*  HCT 25.9*  PLT 297    COAGS:  Recent Labs  06/02/14 0805  INR 0.99    BMP:  Recent Labs  06/02/14 0805  NA 139  K 4.2  CL 106  CO2 20  GLUCOSE 97  BUN 16  CALCIUM 9.0  CREATININE 0.70  GFRNONAA >90  GFRAA >90    LIVER FUNCTION  TESTS: No results for input(s): BILITOT, AST, ALT, ALKPHOS, PROT, ALBUMIN in the last 8760 hours.  TUMOR MARKERS: No results for input(s): AFPTM, CEA, CA199, CHROMGRNA in the last 8760 hours.  Assessment and Plan: Carol Mack is a 43 y.o. female with history of symptomatic uterine fibroids who presents today for elective bilateral uterine artery embolization. Details/risks of procedure d/w pt/family with their understanding and consent. Following procedure pt will be admitted for overnight observation.         Signed: Autumn Messing 06/02/2014, 9:12 AM

## 2014-06-03 ENCOUNTER — Other Ambulatory Visit: Payer: Self-pay | Admitting: Radiology

## 2014-06-03 DIAGNOSIS — D259 Leiomyoma of uterus, unspecified: Secondary | ICD-10-CM

## 2014-06-03 MED ORDER — PROMETHAZINE HCL 25 MG/ML IJ SOLN
12.5000 mg | Freq: Four times a day (QID) | INTRAMUSCULAR | Status: DC | PRN
  Administered 2014-06-03: 12.5 mg via INTRAVENOUS
  Filled 2014-06-03: qty 1

## 2014-06-03 MED ORDER — HYDROCODONE-ACETAMINOPHEN 5-325 MG PO TABS
1.0000 | ORAL_TABLET | ORAL | Status: DC | PRN
  Administered 2014-06-03: 2 via ORAL
  Filled 2014-06-03: qty 2

## 2014-06-03 NOTE — Progress Notes (Signed)
Discharge instructions and medications reviewed with patient. Patient verbalizes understanding and has no questions at this time. Patient confirms she has all personal belongings in her possession. Patient discharged home. 

## 2014-06-03 NOTE — Discharge Summary (Signed)
Patient ID: Carol Mack MRN: [id number] DOB/AGE: [birth year] 43 y.o.  Admit date: 06/02/2014 Discharge date: 06/03/2014  Admission Diagnoses: Symptomatic uterine fibroids with menorrhagia and abdominal pain  Discharge Diagnoses: Symptomatic uterine fibroids with menorrhagia and abdominal pain status post successful bilateral uterine artery embolization on 06/02/2014 Active Problems:   Fibroids   Menorrhagia with regular cycle  Past Medical History  Diagnosis Date  . Bartholin cyst   . Hernia, inguinal   . Hernia, inguinal    Past Surgical History  Procedure Laterality Date  . No past surgeries        Discharged Condition: good  Hospital Course: Carol Mack is a 43 year old black female, patient of Dr. Mora Bellman, who was referred to the interventional radiology service for further evaluation and treatment of symptomatic uterine fibroids. The patient was seen in consultation by Dr. Markus Daft on 12/30/13 and deemed an appropriate candidate for bilateral uterine artery embolization. On 06/02/2014 the patient underwent successful bilateral uterine artery embolization via IV conscious sedation. She was subsequently admitted to the hospital for overnight observation for pain control and hemodynamic monitoring. She was placed on IV Dilaudid PCA pump, intermittent Decadron dosing and anti-emetics for nausea. Overnight the patient was fairly stable with the exception of some mild nausea /vomiting and pelvic cramping/ pain. These symptoms were treated appropriately with the above-mentioned medications. She also stated that she felt as if she had a vaginal yeast infection. She has had these treated with oral Diflucan in the past. On the day of discharge the patient's vital signs were stable. She was able to void, tolerate her diet and ambulate without significant difficulty. She continues to have some minor pelvic pain as expected. The patient was seen by Dr. Laurence Ferrari and deemed stable  for discharge at this time. She was given prescriptions for Diflucan 150 mg 1 tablet, Vicodin 5/325, #30, patient to take 1-2 tablets every 4-6 hours as needed for pain. Phenergan 25 mg, #10, patient to take 1 tablet every 6 hours as needed for nausea. Ibuprofen 600 mg, #20, patient to take 1 tablet every 6 hours for the next 2 days then as needed. Colace 100 mg, #20, patient to take 1 tablet twice a day as needed for constipation. Prednisone Dosepak 10 mg, 15 tablets, patient to take 5 tablets on day one, 4 tablets on day 2, 3 tablets on day 3, 2 tablets on day 4 and 1 tablet on day 5. The patient will be scheduled for follow-up with Dr. Anselm Pancoast in the interventional radiology clinic in 2 weeks. She was told to contact our service with any additional questions or concerns.  Consults: none  Significant Diagnostic Studies:  Results for orders placed or performed during the hospital encounter of 86/76/72  Basic metabolic panel  Result Value Ref Range   Sodium 139 137 - 147 mEq/L   Potassium 4.2 3.7 - 5.3 mEq/L   Chloride 106 96 - 112 mEq/L   CO2 20 19 - 32 mEq/L   Glucose, Bld 97 70 - 99 mg/dL   BUN 16 6 - 23 mg/dL   Creatinine, Ser 0.70 0.50 - 1.10 mg/dL   Calcium 9.0 8.4 - 10.5 mg/dL   GFR calc non Af Amer >90 >90 mL/min   GFR calc Af Amer >90 >90 mL/min   Anion gap 13 5 - 15  CBC with Differential  Result Value Ref Range   WBC 5.6 4.0 - 10.5 K/uL   RBC 4.90 3.87 - 5.11 MIL/uL  Hemoglobin 8.6 (L) 12.0 - 15.0 g/dL   HCT 25.9 (L) 36.0 - 46.0 %   MCV 52.9 (L) 78.0 - 100.0 fL   MCH 17.6 (L) 26.0 - 34.0 pg   MCHC 33.2 30.0 - 36.0 g/dL   RDW 18.6 (H) 11.5 - 15.5 %   Platelets 297 150 - 400 K/uL   Neutrophils Relative % 38 (L) 43 - 77 %   Lymphocytes Relative 49 (H) 12 - 46 %   Monocytes Relative 12 3 - 12 %   Eosinophils Relative 1 0 - 5 %   Basophils Relative 0 0 - 1 %   Neutro Abs 2.1 1.7 - 7.7 K/uL   Lymphs Abs 2.7 0.7 - 4.0 K/uL   Monocytes Absolute 0.7 0.1 - 1.0 K/uL   Eosinophils  Absolute 0.1 0.0 - 0.7 K/uL   Basophils Absolute 0.0 0.0 - 0.1 K/uL   RBC Morphology TARGET CELLS   hCG, serum, qualitative  Result Value Ref Range   Preg, Serum NEGATIVE NEGATIVE  Protime-INR  Result Value Ref Range   Prothrombin Time 13.2 11.6 - 15.2 seconds   INR 0.99 0.00 - 1.49     Treatments: Ir Angiogram Pelvis Selective Or Supraselective  06/02/2014   INDICATION: 43 year old with uterine fibroids, menorrhagia and abdominal pain.  EXAM: BILATERAL UTERINE ARTERY EMBOLIZATION; ULTRASOUND GUIDANCE FOR VASCULAR ACCESS; BILATERAL PELVIC ANGIOGRAPHY  Physician: Stephan Minister. Anselm Pancoast, MD  FLUOROSCOPY TIME:  42 min and 30 seconds, 2924 mGy  MEDICATIONS AND MEDICAL HISTORY: 4 mg versed, 200 mcg fentanyl, 2 g Ancef, 20 mg Decadron and 30 mg Toradol. A radiology nurse monitored the patient for moderate sedation.  ANESTHESIA/SEDATION: Moderate sedation time: 120 minutes  PROCEDURE: Informed consent was obtained for the uterine artery embolization procedure. The patient was placed supine on the interventional table. The right groin was prepped and draped in a sterile fashion. Maximal barrier sterile technique was utilized including caps, mask, sterile gowns, sterile gloves, sterile drape, hand hygiene and skin antiseptic. The skin was anesthetized 1% lidocaine. Using ultrasound guidance, 21 gauge needle was directed in the right common femoral artery and a micropuncture dilator set was placed. The vascular access was upsized to a 5-French vascular sheath. A Cobra catheter was used to cannulate the left common iliac artery and the left internal iliac artery. A series of arteriograms were performed to identify the left uterine artery orfice. A Progreat microcatheter was advanced into the left uterine artery. 2 vials of 500 - 700 micron Embospheres and 3 vials of 700-900 micron Embospheres were injected through the microcatheter with fluoroscopic guidance. There was near stasis of the left uterine artery following  administration of the particles. Post embolization arteriography was performed to document near stasis of the left uterine artery.  A Waltman's loop was formed using the Cobra catheter and the right internal iliac artery was cannulated. The right uterine artery was identified with contrast angiograms. The microcatheter was advanced into the right uterine artery and a series of angiograms were performed. 3 vials of 500-700 micron Embospheres and 1/2 vial of 700-900 micron Embospheres were injected through the microcatheter with fluoroscopic guidance. The particles were injected until there was near stasis of the right uterine artery. Post embolization arteriography was performed to document near stasis of the right uterine artery.  The microcatheter was removed. The Cobra catheter was straightened out over the aortic bifurcation and removed over a wire. Angiogram performed through the right groin sheath. The vascular sheath was removed with an Exoseal closure  device.  FINDINGS: Large uterine arteries bilaterally. Left uterine artery is predominantly supplying the lower uterine segment. Right uterine artery is supplying the fundus. Near stasis of the uterine arteries at the end of the procedure. Fluoroscopic images were obtained for documentation.  COMPLICATIONS: None  IMPRESSION: Successful bilateral uterine artery embolization procedure.   Electronically Signed   By: Markus Daft M.D.   On: 06/02/2014 18:50   Ir Angiogram Pelvis Selective Or Supraselective  06/02/2014   INDICATION: 43 year old with uterine fibroids, menorrhagia and abdominal pain.  EXAM: BILATERAL UTERINE ARTERY EMBOLIZATION; ULTRASOUND GUIDANCE FOR VASCULAR ACCESS; BILATERAL PELVIC ANGIOGRAPHY  Physician: Stephan Minister. Anselm Pancoast, MD  FLUOROSCOPY TIME:  42 min and 30 seconds, 2924 mGy  MEDICATIONS AND MEDICAL HISTORY: 4 mg versed, 200 mcg fentanyl, 2 g Ancef, 20 mg Decadron and 30 mg Toradol. A radiology nurse monitored the patient for moderate sedation.   ANESTHESIA/SEDATION: Moderate sedation time: 120 minutes  PROCEDURE: Informed consent was obtained for the uterine artery embolization procedure. The patient was placed supine on the interventional table. The right groin was prepped and draped in a sterile fashion. Maximal barrier sterile technique was utilized including caps, mask, sterile gowns, sterile gloves, sterile drape, hand hygiene and skin antiseptic. The skin was anesthetized 1% lidocaine. Using ultrasound guidance, 21 gauge needle was directed in the right common femoral artery and a micropuncture dilator set was placed. The vascular access was upsized to a 5-French vascular sheath. A Cobra catheter was used to cannulate the left common iliac artery and the left internal iliac artery. A series of arteriograms were performed to identify the left uterine artery orfice. A Progreat microcatheter was advanced into the left uterine artery. 2 vials of 500 - 700 micron Embospheres and 3 vials of 700-900 micron Embospheres were injected through the microcatheter with fluoroscopic guidance. There was near stasis of the left uterine artery following administration of the particles. Post embolization arteriography was performed to document near stasis of the left uterine artery.  A Waltman's loop was formed using the Cobra catheter and the right internal iliac artery was cannulated. The right uterine artery was identified with contrast angiograms. The microcatheter was advanced into the right uterine artery and a series of angiograms were performed. 3 vials of 500-700 micron Embospheres and 1/2 vial of 700-900 micron Embospheres were injected through the microcatheter with fluoroscopic guidance. The particles were injected until there was near stasis of the right uterine artery. Post embolization arteriography was performed to document near stasis of the right uterine artery.  The microcatheter was removed. The Cobra catheter was straightened out over the aortic  bifurcation and removed over a wire. Angiogram performed through the right groin sheath. The vascular sheath was removed with an Exoseal closure device.  FINDINGS: Large uterine arteries bilaterally. Left uterine artery is predominantly supplying the lower uterine segment. Right uterine artery is supplying the fundus. Near stasis of the uterine arteries at the end of the procedure. Fluoroscopic images were obtained for documentation.  COMPLICATIONS: None  IMPRESSION: Successful bilateral uterine artery embolization procedure.   Electronically Signed   By: Markus Daft M.D.   On: 06/02/2014 18:50   Ir Angiogram Selective Each Additional Vessel  06/02/2014   INDICATION: 43 year old with uterine fibroids, menorrhagia and abdominal pain.  EXAM: BILATERAL UTERINE ARTERY EMBOLIZATION; ULTRASOUND GUIDANCE FOR VASCULAR ACCESS; BILATERAL PELVIC ANGIOGRAPHY  Physician: Stephan Minister. Anselm Pancoast, MD  FLUOROSCOPY TIME:  42 min and 30 seconds, 2924 mGy  MEDICATIONS AND MEDICAL HISTORY: 4 mg versed, 200 mcg  fentanyl, 2 g Ancef, 20 mg Decadron and 30 mg Toradol. A radiology nurse monitored the patient for moderate sedation.  ANESTHESIA/SEDATION: Moderate sedation time: 120 minutes  PROCEDURE: Informed consent was obtained for the uterine artery embolization procedure. The patient was placed supine on the interventional table. The right groin was prepped and draped in a sterile fashion. Maximal barrier sterile technique was utilized including caps, mask, sterile gowns, sterile gloves, sterile drape, hand hygiene and skin antiseptic. The skin was anesthetized 1% lidocaine. Using ultrasound guidance, 21 gauge needle was directed in the right common femoral artery and a micropuncture dilator set was placed. The vascular access was upsized to a 5-French vascular sheath. A Cobra catheter was used to cannulate the left common iliac artery and the left internal iliac artery. A series of arteriograms were performed to identify the left uterine artery  orfice. A Progreat microcatheter was advanced into the left uterine artery. 2 vials of 500 - 700 micron Embospheres and 3 vials of 700-900 micron Embospheres were injected through the microcatheter with fluoroscopic guidance. There was near stasis of the left uterine artery following administration of the particles. Post embolization arteriography was performed to document near stasis of the left uterine artery.  A Waltman's loop was formed using the Cobra catheter and the right internal iliac artery was cannulated. The right uterine artery was identified with contrast angiograms. The microcatheter was advanced into the right uterine artery and a series of angiograms were performed. 3 vials of 500-700 micron Embospheres and 1/2 vial of 700-900 micron Embospheres were injected through the microcatheter with fluoroscopic guidance. The particles were injected until there was near stasis of the right uterine artery. Post embolization arteriography was performed to document near stasis of the right uterine artery.  The microcatheter was removed. The Cobra catheter was straightened out over the aortic bifurcation and removed over a wire. Angiogram performed through the right groin sheath. The vascular sheath was removed with an Exoseal closure device.  FINDINGS: Large uterine arteries bilaterally. Left uterine artery is predominantly supplying the lower uterine segment. Right uterine artery is supplying the fundus. Near stasis of the uterine arteries at the end of the procedure. Fluoroscopic images were obtained for documentation.  COMPLICATIONS: None  IMPRESSION: Successful bilateral uterine artery embolization procedure.   Electronically Signed   By: Markus Daft M.D.   On: 06/02/2014 18:50   Ir Angiogram Selective Each Additional Vessel  06/02/2014   INDICATION: 43 year old with uterine fibroids, menorrhagia and abdominal pain.  EXAM: BILATERAL UTERINE ARTERY EMBOLIZATION; ULTRASOUND GUIDANCE FOR VASCULAR ACCESS;  BILATERAL PELVIC ANGIOGRAPHY  Physician: Stephan Minister. Anselm Pancoast, MD  FLUOROSCOPY TIME:  42 min and 30 seconds, 2924 mGy  MEDICATIONS AND MEDICAL HISTORY: 4 mg versed, 200 mcg fentanyl, 2 g Ancef, 20 mg Decadron and 30 mg Toradol. A radiology nurse monitored the patient for moderate sedation.  ANESTHESIA/SEDATION: Moderate sedation time: 120 minutes  PROCEDURE: Informed consent was obtained for the uterine artery embolization procedure. The patient was placed supine on the interventional table. The right groin was prepped and draped in a sterile fashion. Maximal barrier sterile technique was utilized including caps, mask, sterile gowns, sterile gloves, sterile drape, hand hygiene and skin antiseptic. The skin was anesthetized 1% lidocaine. Using ultrasound guidance, 21 gauge needle was directed in the right common femoral artery and a micropuncture dilator set was placed. The vascular access was upsized to a 5-French vascular sheath. A Cobra catheter was used to cannulate the left common iliac artery and the left  internal iliac artery. A series of arteriograms were performed to identify the left uterine artery orfice. A Progreat microcatheter was advanced into the left uterine artery. 2 vials of 500 - 700 micron Embospheres and 3 vials of 700-900 micron Embospheres were injected through the microcatheter with fluoroscopic guidance. There was near stasis of the left uterine artery following administration of the particles. Post embolization arteriography was performed to document near stasis of the left uterine artery.  A Waltman's loop was formed using the Cobra catheter and the right internal iliac artery was cannulated. The right uterine artery was identified with contrast angiograms. The microcatheter was advanced into the right uterine artery and a series of angiograms were performed. 3 vials of 500-700 micron Embospheres and 1/2 vial of 700-900 micron Embospheres were injected through the microcatheter with fluoroscopic  guidance. The particles were injected until there was near stasis of the right uterine artery. Post embolization arteriography was performed to document near stasis of the right uterine artery.  The microcatheter was removed. The Cobra catheter was straightened out over the aortic bifurcation and removed over a wire. Angiogram performed through the right groin sheath. The vascular sheath was removed with an Exoseal closure device.  FINDINGS: Large uterine arteries bilaterally. Left uterine artery is predominantly supplying the lower uterine segment. Right uterine artery is supplying the fundus. Near stasis of the uterine arteries at the end of the procedure. Fluoroscopic images were obtained for documentation.  COMPLICATIONS: None  IMPRESSION: Successful bilateral uterine artery embolization procedure.   Electronically Signed   By: Markus Daft M.D.   On: 06/02/2014 18:50   Ir US Guide Vasc Access Right  06/02/2014   INDICATION: 43 year old with uterine fibroids, menorrhagia and abdominal pain.  EXAM: BILATERAL UTERINE ARTERY EMBOLIZATION; ULTRASOUND GUIDANCE FOR VASCULAR ACCESS; BILATERAL PELVIC ANGIOGRAPHY  Physician: Stephan Minister. Anselm Pancoast, MD  FLUOROSCOPY TIME:  42 min and 30 seconds, 2924 mGy  MEDICATIONS AND MEDICAL HISTORY: 4 mg versed, 200 mcg fentanyl, 2 g Ancef, 20 mg Decadron and 30 mg Toradol. A radiology nurse monitored the patient for moderate sedation.  ANESTHESIA/SEDATION: Moderate sedation time: 120 minutes  PROCEDURE: Informed consent was obtained for the uterine artery embolization procedure. The patient was placed supine on the interventional table. The right groin was prepped and draped in a sterile fashion. Maximal barrier sterile technique was utilized including caps, mask, sterile gowns, sterile gloves, sterile drape, hand hygiene and skin antiseptic. The skin was anesthetized 1% lidocaine. Using ultrasound guidance, 21 gauge needle was directed in the right common femoral artery and a micropuncture  dilator set was placed. The vascular access was upsized to a 5-French vascular sheath. A Cobra catheter was used to cannulate the left common iliac artery and the left internal iliac artery. A series of arteriograms were performed to identify the left uterine artery orfice. A Progreat microcatheter was advanced into the left uterine artery. 2 vials of 500 - 700 micron Embospheres and 3 vials of 700-900 micron Embospheres were injected through the microcatheter with fluoroscopic guidance. There was near stasis of the left uterine artery following administration of the particles. Post embolization arteriography was performed to document near stasis of the left uterine artery.  A Waltman's loop was formed using the Cobra catheter and the right internal iliac artery was cannulated. The right uterine artery was identified with contrast angiograms. The microcatheter was advanced into the right uterine artery and a series of angiograms were performed. 3 vials of 500-700 micron Embospheres and 1/2 vial of 700-900  micron Embospheres were injected through the microcatheter with fluoroscopic guidance. The particles were injected until there was near stasis of the right uterine artery. Post embolization arteriography was performed to document near stasis of the right uterine artery.  The microcatheter was removed. The Cobra catheter was straightened out over the aortic bifurcation and removed over a wire. Angiogram performed through the right groin sheath. The vascular sheath was removed with an Exoseal closure device.  FINDINGS: Large uterine arteries bilaterally. Left uterine artery is predominantly supplying the lower uterine segment. Right uterine artery is supplying the fundus. Near stasis of the uterine arteries at the end of the procedure. Fluoroscopic images were obtained for documentation.  COMPLICATIONS: None  IMPRESSION: Successful bilateral uterine artery embolization procedure.   Electronically Signed   By: Markus Daft M.D.   On: 06/02/2014 18:50   Ir Embo Tumor Organ Ischemia Infarct Inc Guide Roadmapping  06/02/2014   INDICATION: 43 year old with uterine fibroids, menorrhagia and abdominal pain.  EXAM: BILATERAL UTERINE ARTERY EMBOLIZATION; ULTRASOUND GUIDANCE FOR VASCULAR ACCESS; BILATERAL PELVIC ANGIOGRAPHY  Physician: Stephan Minister. Anselm Pancoast, MD  FLUOROSCOPY TIME:  42 min and 30 seconds, 2924 mGy  MEDICATIONS AND MEDICAL HISTORY: 4 mg versed, 200 mcg fentanyl, 2 g Ancef, 20 mg Decadron and 30 mg Toradol. A radiology nurse monitored the patient for moderate sedation.  ANESTHESIA/SEDATION: Moderate sedation time: 120 minutes  PROCEDURE: Informed consent was obtained for the uterine artery embolization procedure. The patient was placed supine on the interventional table. The right groin was prepped and draped in a sterile fashion. Maximal barrier sterile technique was utilized including caps, mask, sterile gowns, sterile gloves, sterile drape, hand hygiene and skin antiseptic. The skin was anesthetized 1% lidocaine. Using ultrasound guidance, 21 gauge needle was directed in the right common femoral artery and a micropuncture dilator set was placed. The vascular access was upsized to a 5-French vascular sheath. A Cobra catheter was used to cannulate the left common iliac artery and the left internal iliac artery. A series of arteriograms were performed to identify the left uterine artery orfice. A Progreat microcatheter was advanced into the left uterine artery. 2 vials of 500 - 700 micron Embospheres and 3 vials of 700-900 micron Embospheres were injected through the microcatheter with fluoroscopic guidance. There was near stasis of the left uterine artery following administration of the particles. Post embolization arteriography was performed to document near stasis of the left uterine artery.  A Waltman's loop was formed using the Cobra catheter and the right internal iliac artery was cannulated. The right uterine artery was  identified with contrast angiograms. The microcatheter was advanced into the right uterine artery and a series of angiograms were performed. 3 vials of 500-700 micron Embospheres and 1/2 vial of 700-900 micron Embospheres were injected through the microcatheter with fluoroscopic guidance. The particles were injected until there was near stasis of the right uterine artery. Post embolization arteriography was performed to document near stasis of the right uterine artery.  The microcatheter was removed. The Cobra catheter was straightened out over the aortic bifurcation and removed over a wire. Angiogram performed through the right groin sheath. The vascular sheath was removed with an Exoseal closure device.  FINDINGS: Large uterine arteries bilaterally. Left uterine artery is predominantly supplying the lower uterine segment. Right uterine artery is supplying the fundus. Near stasis of the uterine arteries at the end of the procedure. Fluoroscopic images were obtained for documentation.  COMPLICATIONS: None  IMPRESSION: Successful bilateral uterine artery embolization procedure.  Electronically Signed   By: Markus Daft M.D.   On: 06/02/2014 18:50     Discharge Exam: Blood pressure 137/80, pulse 73, temperature 98.2 F (36.8 C), temperature source Oral, resp. rate 18, height 5\' 4"  (1.626 m), weight 140 lb (63.504 kg), last menstrual period 05/15/2014, SpO2 97 %. Patient is awake, alert and oriented. Chest is clear to auscultation bilaterally. Heart with regular rate and rhythm. Abdomen soft, positive bowel sounds, fibroid uterus, mild to moderate pelvic tenderness. Extremities with full range of motion and no significant edema. Puncture site right common femoral artery clean, dry, nontender, no hematoma. Intact distal pulses.  Disposition: home  Discharge Instructions    Call MD for:  difficulty breathing, headache or visual disturbances    Complete by:  As directed      Call MD for:  extreme fatigue     Complete by:  As directed      Call MD for:  hives    Complete by:  As directed      Call MD for:  persistant dizziness or light-headedness    Complete by:  As directed      Call MD for:  persistant nausea and vomiting    Complete by:  As directed      Call MD for:  redness, tenderness, or signs of infection (pain, swelling, redness, odor or green/yellow discharge around incision site)    Complete by:  As directed      Call MD for:  severe uncontrolled pain    Complete by:  As directed      Call MD for:  temperature >100.4    Complete by:  As directed      Change dressing (specify)    Complete by:  As directed   May change dressing over right groin tomorrow and apply bandaid to site; change daily for next 2-3 days; may wash site with soap and water     Diet - low sodium heart healthy    Complete by:  As directed      Driving Restrictions    Complete by:  As directed   No driving for next 48 hours     Increase activity slowly    Complete by:  As directed      Lifting restrictions    Complete by:  As directed   No heavy lifting for next 3-4 days     May shower / Bathe    Complete by:  As directed      May walk up steps    Complete by:  As directed      Sexual Activity Restrictions    Complete by:  As directed   No sexual intercourse for 1 week            Medication List    TAKE these medications        albuterol 108 (90 BASE) MCG/ACT inhaler  Commonly known as:  PROVENTIL HFA;VENTOLIN HFA  Inhale 2 puffs into the lungs every 6 (six) hours as needed for shortness of breath.     ibuprofen 200 MG tablet  Commonly known as:  ADVIL,MOTRIN  Take 800 mg by mouth every 6 (six) hours as needed for moderate pain.     ondansetron 4 MG disintegrating tablet  Commonly known as:  ZOFRAN ODT  Take 1 tablet (4 mg total) by mouth every 8 (eight) hours as needed for nausea or vomiting.           Follow-up Information  Follow up with Carylon Perches, MD.   Specialty:   Interventional Radiology   Why:  radiology will call you with follow up appt with Dr. Anselm Pancoast in 2 weeks; call [phone] or [phone] with any questions   Contact information:   [place] [address] [phone]       Follow up with CONSTANT,PEGGY, MD.   Specialty:  Obstetrics and Gynecology   Why:  follow up with Dr. Elly Modena as scheduled   Contact information:   [place] [address] [phone]       Signed: Nash Mantis 06/03/2014, 1:36 PM

## 2014-06-03 NOTE — Progress Notes (Signed)
24hr PCA  0.6mg  4 demands 2 deliveries

## 2014-06-03 NOTE — Progress Notes (Addendum)
1 Day Post-Op  Subjective: Pt c/o nausea, intermittent pelvic pain; had some vomiting last night as well; has eaten and voided without difficulty this am; still drowsy; also states she has yeast infection  Objective: Vital signs in last 24 hours: Temp:  [97.4 F (36.3 C)-98.2 F (36.8 C)] 98.2 F (36.8 C) (11/20 0607) Pulse Rate:  [66-85] 73 (11/20 0607) Resp:  [14-29] 18 (11/20 0931) BP: (111-165)/(63-105) 137/80 mmHg (11/20 0607) SpO2:  [89 %-100 %] 97 % (11/20 0931)    Intake/Output from previous day: 11/19 0701 - 11/20 0700 In: 3 [I.V.:3] Out: 1200 [Urine:1200] Intake/Output this shift:    Pt drowsy but alert; abd soft, fibroid uterus, mild-mod pelvic tenderness; rt CFA puncture site clean and dry, no hematoma  Lab Results:   Recent Labs  06/02/14 0805  WBC 5.6  HGB 8.6*  HCT 25.9*  PLT 297   BMET  Recent Labs  06/02/14 0805  NA 139  K 4.2  CL 106  CO2 20  GLUCOSE 97  BUN 16  CREATININE 0.70  CALCIUM 9.0   PT/INR  Recent Labs  06/02/14 0805  LABPROT 13.2  INR 0.99   ABG No results for input(s): PHART, HCO3 in the last 72 hours.  Invalid input(s): PCO2, PO2  Studies/Results: Ir Angiogram Pelvis Selective Or Supraselective  06/02/2014   INDICATION: 43 year old with uterine fibroids, menorrhagia and abdominal pain.  EXAM: BILATERAL UTERINE ARTERY EMBOLIZATION; ULTRASOUND GUIDANCE FOR VASCULAR ACCESS; BILATERAL PELVIC ANGIOGRAPHY  Physician: Stephan Minister. Anselm Pancoast, MD  FLUOROSCOPY TIME:  42 min and 30 seconds, 2924 mGy  MEDICATIONS AND MEDICAL HISTORY: 4 mg versed, 200 mcg fentanyl, 2 g Ancef, 20 mg Decadron and 30 mg Toradol. A radiology nurse monitored the patient for moderate sedation.  ANESTHESIA/SEDATION: Moderate sedation time: 120 minutes  PROCEDURE: Informed consent was obtained for the uterine artery embolization procedure. The patient was placed supine on the interventional table. The right groin was prepped and draped in a sterile fashion. Maximal  barrier sterile technique was utilized including caps, mask, sterile gowns, sterile gloves, sterile drape, hand hygiene and skin antiseptic. The skin was anesthetized 1% lidocaine. Using ultrasound guidance, 21 gauge needle was directed in the right common femoral artery and a micropuncture dilator set was placed. The vascular access was upsized to a 5-French vascular sheath. A Cobra catheter was used to cannulate the left common iliac artery and the left internal iliac artery. A series of arteriograms were performed to identify the left uterine artery orfice. A Progreat microcatheter was advanced into the left uterine artery. 2 vials of 500 - 700 micron Embospheres and 3 vials of 700-900 micron Embospheres were injected through the microcatheter with fluoroscopic guidance. There was near stasis of the left uterine artery following administration of the particles. Post embolization arteriography was performed to document near stasis of the left uterine artery.  A Waltman's loop was formed using the Cobra catheter and the right internal iliac artery was cannulated. The right uterine artery was identified with contrast angiograms. The microcatheter was advanced into the right uterine artery and a series of angiograms were performed. 3 vials of 500-700 micron Embospheres and 1/2 vial of 700-900 micron Embospheres were injected through the microcatheter with fluoroscopic guidance. The particles were injected until there was near stasis of the right uterine artery. Post embolization arteriography was performed to document near stasis of the right uterine artery.  The microcatheter was removed. The Cobra catheter was straightened out over the aortic bifurcation and removed over a  wire. Angiogram performed through the right groin sheath. The vascular sheath was removed with an Exoseal closure device.  FINDINGS: Large uterine arteries bilaterally. Left uterine artery is predominantly supplying the lower uterine segment.  Right uterine artery is supplying the fundus. Near stasis of the uterine arteries at the end of the procedure. Fluoroscopic images were obtained for documentation.  COMPLICATIONS: None  IMPRESSION: Successful bilateral uterine artery embolization procedure.   Electronically Signed   By: Markus Daft M.D.   On: 06/02/2014 18:50   Ir Angiogram Pelvis Selective Or Supraselective  06/02/2014   INDICATION: 43 year old with uterine fibroids, menorrhagia and abdominal pain.  EXAM: BILATERAL UTERINE ARTERY EMBOLIZATION; ULTRASOUND GUIDANCE FOR VASCULAR ACCESS; BILATERAL PELVIC ANGIOGRAPHY  Physician: Stephan Minister. Anselm Pancoast, MD  FLUOROSCOPY TIME:  42 min and 30 seconds, 2924 mGy  MEDICATIONS AND MEDICAL HISTORY: 4 mg versed, 200 mcg fentanyl, 2 g Ancef, 20 mg Decadron and 30 mg Toradol. A radiology nurse monitored the patient for moderate sedation.  ANESTHESIA/SEDATION: Moderate sedation time: 120 minutes  PROCEDURE: Informed consent was obtained for the uterine artery embolization procedure. The patient was placed supine on the interventional table. The right groin was prepped and draped in a sterile fashion. Maximal barrier sterile technique was utilized including caps, mask, sterile gowns, sterile gloves, sterile drape, hand hygiene and skin antiseptic. The skin was anesthetized 1% lidocaine. Using ultrasound guidance, 21 gauge needle was directed in the right common femoral artery and a micropuncture dilator set was placed. The vascular access was upsized to a 5-French vascular sheath. A Cobra catheter was used to cannulate the left common iliac artery and the left internal iliac artery. A series of arteriograms were performed to identify the left uterine artery orfice. A Progreat microcatheter was advanced into the left uterine artery. 2 vials of 500 - 700 micron Embospheres and 3 vials of 700-900 micron Embospheres were injected through the microcatheter with fluoroscopic guidance. There was near stasis of the left uterine  artery following administration of the particles. Post embolization arteriography was performed to document near stasis of the left uterine artery.  A Waltman's loop was formed using the Cobra catheter and the right internal iliac artery was cannulated. The right uterine artery was identified with contrast angiograms. The microcatheter was advanced into the right uterine artery and a series of angiograms were performed. 3 vials of 500-700 micron Embospheres and 1/2 vial of 700-900 micron Embospheres were injected through the microcatheter with fluoroscopic guidance. The particles were injected until there was near stasis of the right uterine artery. Post embolization arteriography was performed to document near stasis of the right uterine artery.  The microcatheter was removed. The Cobra catheter was straightened out over the aortic bifurcation and removed over a wire. Angiogram performed through the right groin sheath. The vascular sheath was removed with an Exoseal closure device.  FINDINGS: Large uterine arteries bilaterally. Left uterine artery is predominantly supplying the lower uterine segment. Right uterine artery is supplying the fundus. Near stasis of the uterine arteries at the end of the procedure. Fluoroscopic images were obtained for documentation.  COMPLICATIONS: None  IMPRESSION: Successful bilateral uterine artery embolization procedure.   Electronically Signed   By: Markus Daft M.D.   On: 06/02/2014 18:50   Ir Angiogram Selective Each Additional Vessel  06/02/2014   INDICATION: 43 year old with uterine fibroids, menorrhagia and abdominal pain.  EXAM: BILATERAL UTERINE ARTERY EMBOLIZATION; ULTRASOUND GUIDANCE FOR VASCULAR ACCESS; BILATERAL PELVIC ANGIOGRAPHY  Physician: Stephan Minister. Anselm Pancoast, MD  FLUOROSCOPY TIME:  42 min and 30 seconds, 2924 mGy  MEDICATIONS AND MEDICAL HISTORY: 4 mg versed, 200 mcg fentanyl, 2 g Ancef, 20 mg Decadron and 30 mg Toradol. A radiology nurse monitored the patient for  moderate sedation.  ANESTHESIA/SEDATION: Moderate sedation time: 120 minutes  PROCEDURE: Informed consent was obtained for the uterine artery embolization procedure. The patient was placed supine on the interventional table. The right groin was prepped and draped in a sterile fashion. Maximal barrier sterile technique was utilized including caps, mask, sterile gowns, sterile gloves, sterile drape, hand hygiene and skin antiseptic. The skin was anesthetized 1% lidocaine. Using ultrasound guidance, 21 gauge needle was directed in the right common femoral artery and a micropuncture dilator set was placed. The vascular access was upsized to a 5-French vascular sheath. A Cobra catheter was used to cannulate the left common iliac artery and the left internal iliac artery. A series of arteriograms were performed to identify the left uterine artery orfice. A Progreat microcatheter was advanced into the left uterine artery. 2 vials of 500 - 700 micron Embospheres and 3 vials of 700-900 micron Embospheres were injected through the microcatheter with fluoroscopic guidance. There was near stasis of the left uterine artery following administration of the particles. Post embolization arteriography was performed to document near stasis of the left uterine artery.  A Waltman's loop was formed using the Cobra catheter and the right internal iliac artery was cannulated. The right uterine artery was identified with contrast angiograms. The microcatheter was advanced into the right uterine artery and a series of angiograms were performed. 3 vials of 500-700 micron Embospheres and 1/2 vial of 700-900 micron Embospheres were injected through the microcatheter with fluoroscopic guidance. The particles were injected until there was near stasis of the right uterine artery. Post embolization arteriography was performed to document near stasis of the right uterine artery.  The microcatheter was removed. The Cobra catheter was straightened out  over the aortic bifurcation and removed over a wire. Angiogram performed through the right groin sheath. The vascular sheath was removed with an Exoseal closure device.  FINDINGS: Large uterine arteries bilaterally. Left uterine artery is predominantly supplying the lower uterine segment. Right uterine artery is supplying the fundus. Near stasis of the uterine arteries at the end of the procedure. Fluoroscopic images were obtained for documentation.  COMPLICATIONS: None  IMPRESSION: Successful bilateral uterine artery embolization procedure.   Electronically Signed   By: Markus Daft M.D.   On: 06/02/2014 18:50   Ir Angiogram Selective Each Additional Vessel  06/02/2014   INDICATION: 43 year old with uterine fibroids, menorrhagia and abdominal pain.  EXAM: BILATERAL UTERINE ARTERY EMBOLIZATION; ULTRASOUND GUIDANCE FOR VASCULAR ACCESS; BILATERAL PELVIC ANGIOGRAPHY  Physician: Stephan Minister. Anselm Pancoast, MD  FLUOROSCOPY TIME:  42 min and 30 seconds, 2924 mGy  MEDICATIONS AND MEDICAL HISTORY: 4 mg versed, 200 mcg fentanyl, 2 g Ancef, 20 mg Decadron and 30 mg Toradol. A radiology nurse monitored the patient for moderate sedation.  ANESTHESIA/SEDATION: Moderate sedation time: 120 minutes  PROCEDURE: Informed consent was obtained for the uterine artery embolization procedure. The patient was placed supine on the interventional table. The right groin was prepped and draped in a sterile fashion. Maximal barrier sterile technique was utilized including caps, mask, sterile gowns, sterile gloves, sterile drape, hand hygiene and skin antiseptic. The skin was anesthetized 1% lidocaine. Using ultrasound guidance, 21 gauge needle was directed in the right common femoral artery and a micropuncture dilator set was placed. The vascular access was upsized to a 5-French  vascular sheath. A Cobra catheter was used to cannulate the left common iliac artery and the left internal iliac artery. A series of arteriograms were performed to identify the  left uterine artery orfice. A Progreat microcatheter was advanced into the left uterine artery. 2 vials of 500 - 700 micron Embospheres and 3 vials of 700-900 micron Embospheres were injected through the microcatheter with fluoroscopic guidance. There was near stasis of the left uterine artery following administration of the particles. Post embolization arteriography was performed to document near stasis of the left uterine artery.  A Waltman's loop was formed using the Cobra catheter and the right internal iliac artery was cannulated. The right uterine artery was identified with contrast angiograms. The microcatheter was advanced into the right uterine artery and a series of angiograms were performed. 3 vials of 500-700 micron Embospheres and 1/2 vial of 700-900 micron Embospheres were injected through the microcatheter with fluoroscopic guidance. The particles were injected until there was near stasis of the right uterine artery. Post embolization arteriography was performed to document near stasis of the right uterine artery.  The microcatheter was removed. The Cobra catheter was straightened out over the aortic bifurcation and removed over a wire. Angiogram performed through the right groin sheath. The vascular sheath was removed with an Exoseal closure device.  FINDINGS: Large uterine arteries bilaterally. Left uterine artery is predominantly supplying the lower uterine segment. Right uterine artery is supplying the fundus. Near stasis of the uterine arteries at the end of the procedure. Fluoroscopic images were obtained for documentation.  COMPLICATIONS: None  IMPRESSION: Successful bilateral uterine artery embolization procedure.   Electronically Signed   By: Markus Daft M.D.   On: 06/02/2014 18:50   Ir US Guide Vasc Access Right  06/02/2014   INDICATION: 43 year old with uterine fibroids, menorrhagia and abdominal pain.  EXAM: BILATERAL UTERINE ARTERY EMBOLIZATION; ULTRASOUND GUIDANCE FOR VASCULAR ACCESS;  BILATERAL PELVIC ANGIOGRAPHY  Physician: Stephan Minister. Anselm Pancoast, MD  FLUOROSCOPY TIME:  42 min and 30 seconds, 2924 mGy  MEDICATIONS AND MEDICAL HISTORY: 4 mg versed, 200 mcg fentanyl, 2 g Ancef, 20 mg Decadron and 30 mg Toradol. A radiology nurse monitored the patient for moderate sedation.  ANESTHESIA/SEDATION: Moderate sedation time: 120 minutes  PROCEDURE: Informed consent was obtained for the uterine artery embolization procedure. The patient was placed supine on the interventional table. The right groin was prepped and draped in a sterile fashion. Maximal barrier sterile technique was utilized including caps, mask, sterile gowns, sterile gloves, sterile drape, hand hygiene and skin antiseptic. The skin was anesthetized 1% lidocaine. Using ultrasound guidance, 21 gauge needle was directed in the right common femoral artery and a micropuncture dilator set was placed. The vascular access was upsized to a 5-French vascular sheath. A Cobra catheter was used to cannulate the left common iliac artery and the left internal iliac artery. A series of arteriograms were performed to identify the left uterine artery orfice. A Progreat microcatheter was advanced into the left uterine artery. 2 vials of 500 - 700 micron Embospheres and 3 vials of 700-900 micron Embospheres were injected through the microcatheter with fluoroscopic guidance. There was near stasis of the left uterine artery following administration of the particles. Post embolization arteriography was performed to document near stasis of the left uterine artery.  A Waltman's loop was formed using the Cobra catheter and the right internal iliac artery was cannulated. The right uterine artery was identified with contrast angiograms. The microcatheter was advanced into the right uterine artery and  a series of angiograms were performed. 3 vials of 500-700 micron Embospheres and 1/2 vial of 700-900 micron Embospheres were injected through the microcatheter with fluoroscopic  guidance. The particles were injected until there was near stasis of the right uterine artery. Post embolization arteriography was performed to document near stasis of the right uterine artery.  The microcatheter was removed. The Cobra catheter was straightened out over the aortic bifurcation and removed over a wire. Angiogram performed through the right groin sheath. The vascular sheath was removed with an Exoseal closure device.  FINDINGS: Large uterine arteries bilaterally. Left uterine artery is predominantly supplying the lower uterine segment. Right uterine artery is supplying the fundus. Near stasis of the uterine arteries at the end of the procedure. Fluoroscopic images were obtained for documentation.  COMPLICATIONS: None  IMPRESSION: Successful bilateral uterine artery embolization procedure.   Electronically Signed   By: Markus Daft M.D.   On: 06/02/2014 18:50   Ir Embo Tumor Organ Ischemia Infarct Inc Guide Roadmapping  06/02/2014   INDICATION: 43 year old with uterine fibroids, menorrhagia and abdominal pain.  EXAM: BILATERAL UTERINE ARTERY EMBOLIZATION; ULTRASOUND GUIDANCE FOR VASCULAR ACCESS; BILATERAL PELVIC ANGIOGRAPHY  Physician: Stephan Minister. Anselm Pancoast, MD  FLUOROSCOPY TIME:  42 min and 30 seconds, 2924 mGy  MEDICATIONS AND MEDICAL HISTORY: 4 mg versed, 200 mcg fentanyl, 2 g Ancef, 20 mg Decadron and 30 mg Toradol. A radiology nurse monitored the patient for moderate sedation.  ANESTHESIA/SEDATION: Moderate sedation time: 120 minutes  PROCEDURE: Informed consent was obtained for the uterine artery embolization procedure. The patient was placed supine on the interventional table. The right groin was prepped and draped in a sterile fashion. Maximal barrier sterile technique was utilized including caps, mask, sterile gowns, sterile gloves, sterile drape, hand hygiene and skin antiseptic. The skin was anesthetized 1% lidocaine. Using ultrasound guidance, 21 gauge needle was directed in the right common  femoral artery and a micropuncture dilator set was placed. The vascular access was upsized to a 5-French vascular sheath. A Cobra catheter was used to cannulate the left common iliac artery and the left internal iliac artery. A series of arteriograms were performed to identify the left uterine artery orfice. A Progreat microcatheter was advanced into the left uterine artery. 2 vials of 500 - 700 micron Embospheres and 3 vials of 700-900 micron Embospheres were injected through the microcatheter with fluoroscopic guidance. There was near stasis of the left uterine artery following administration of the particles. Post embolization arteriography was performed to document near stasis of the left uterine artery.  A Waltman's loop was formed using the Cobra catheter and the right internal iliac artery was cannulated. The right uterine artery was identified with contrast angiograms. The microcatheter was advanced into the right uterine artery and a series of angiograms were performed. 3 vials of 500-700 micron Embospheres and 1/2 vial of 700-900 micron Embospheres were injected through the microcatheter with fluoroscopic guidance. The particles were injected until there was near stasis of the right uterine artery. Post embolization arteriography was performed to document near stasis of the right uterine artery.  The microcatheter was removed. The Cobra catheter was straightened out over the aortic bifurcation and removed over a wire. Angiogram performed through the right groin sheath. The vascular sheath was removed with an Exoseal closure device.  FINDINGS: Large uterine arteries bilaterally. Left uterine artery is predominantly supplying the lower uterine segment. Right uterine artery is supplying the fundus. Near stasis of the uterine arteries at the end of the procedure. Fluoroscopic  images were obtained for documentation.  COMPLICATIONS: None  IMPRESSION: Successful bilateral uterine artery embolization procedure.    Electronically Signed   By: Markus Daft M.D.   On: 06/02/2014 18:50    Anti-infectives: Anti-infectives    Start     Dose/Rate Route Frequency Ordered Stop   06/02/14 0848  ceFAZolin (ANCEF) 2-3 GM-% IVPB SOLR    Comments:  Duininck, Stacey   : cabinet override      06/02/14 0848 06/02/14 2059   06/02/14 0745  ceFAZolin (ANCEF) IVPB 2 g/50 mL premix     2 g100 mL/hr over 30 Minutes Intravenous  Once 06/02/14 0732 06/02/14 1051      Assessment/Plan: s/p bilateral [place] 11/19; add IV phenergan for nausea, d/c dilaudid PCA, cont IV toradol, ambulate; will wait until d/c to administer diflucan for vaginal yeast infection; Dr. Laurence Ferrari has also seen pt this am. Will re eval pt later today for poss d/c home   ALLRED,D [facility] 06/03/2014

## 2014-06-06 ENCOUNTER — Telehealth: Payer: Self-pay | Admitting: Radiology

## 2014-06-06 NOTE — Telephone Encounter (Signed)
Family members left voice mail messages on IR clinic voice mail regarding patient's symptoms post [place].  Attempted to contact family and patient on phone #'s provided.  Left voice mail requesting patient to call back.   Oliviagrace Crisanti Yardley, RN 06/06/2014 10:10 AM

## 2014-06-08 ENCOUNTER — Ambulatory Visit
Admission: RE | Admit: 2014-06-08 | Discharge: 2014-06-08 | Disposition: A | Discharge status: Home / Self Care | Payer: Self-pay | Source: Ambulatory Visit | Attending: Radiology | Admitting: Radiology

## 2014-06-08 DIAGNOSIS — D259 Leiomyoma of uterus, unspecified: Secondary | ICD-10-CM

## 2014-06-08 NOTE — Consult Note (Signed)
Chief Complaint: Chief Complaint  Patient presents with  . Follow-up    1 wk follow up [place]      Referring Physician(s): Allred,D Lennette Bihari  History of Present Illness: Carol Mack is a 43 y.o. female with an enlarged uterus due to multiple fibroids. Patient has menorrhagia and pelvic pain related to the uterine fibroids. The patient underwent uterine artery embolization on 06/02/2014. The hospital course following the embolization was unremarkable and the patient was discharged 1 day after the procedure. A couple days after the procedure, the patient called because of persistent pain and nausea. The patient told us that she did not get the prescription for ibuprofen and she was only taking the Vicodin and prednisone. I told the patient that she needed take ibuprofen regularly to help with the swelling. Patient presents today because she is having vaginal discharge which is bloody but also has foul-smelling. Prior to discharge, the patient was given a prescription for Diflucan for possible yeast infection. The patient continues to have significant pain and discomfort following the procedure. She is not able to eat regularly because of nausea and some vomiting. She is able to keep most fluids down and she had a bowel movement today. She says that she has had some chills and may have had a low-grade fever.  Past Medical History  Diagnosis Date  . Bartholin cyst   . Hernia, inguinal   . Hernia, inguinal     Past Surgical History  Procedure Laterality Date  . No past surgeries      Allergies: Review of patient's allergies indicates no known allergies.  Medications: Prior to Admission medications   Medication Sig Start Date End Date Taking? Authorizing Provider  albuterol (PROVENTIL HFA;VENTOLIN HFA) 108 (90 BASE) MCG/ACT inhaler Inhale 2 puffs into the lungs every 6 (six) hours as needed for shortness of breath.    Historical Provider, MD  ibuprofen (ADVIL,MOTRIN) 200 MG tablet Take  800 mg by mouth every 6 (six) hours as needed for moderate pain.    Historical Provider, MD  ondansetron (ZOFRAN ODT) 4 MG disintegrating tablet Take 1 tablet (4 mg total) by mouth every 8 (eight) hours as needed for nausea or vomiting. 03/04/14   Carlisle Cater, PA-C    No family history on file.  History   Social History  . Marital Status: Single    Spouse Name: N/A    Number of Children: N/A  . Years of Education: N/A   Social History Main Topics  . Smoking status: Current Every Day Smoker -- 1.00 packs/day    Types: Cigarettes  . Smokeless tobacco: Not on file  . Alcohol Use: No  . Drug Use: No  . Sexual Activity: Yes    Birth Control/ Protection: None   Other Topics Concern  . Not on file   Social History Narrative      Review of Systems  Gastrointestinal: Positive for nausea.  Genitourinary: Positive for vaginal bleeding and vaginal discharge.    Vital Signs: BP 127/84 mmHg  Pulse 90  Temp(Src) 98.8 F (37.1 C) (Oral)  Resp 14  SpO2 97%  LMP 06/03/2014  Physical Exam  Abdominal: Soft. There is tenderness.  Musculoskeletal:  Right groin incision is healed, no hematoma.    Imaging: Ir Angiogram Pelvis Selective Or Supraselective  06/02/2014   INDICATION: 43 year old with uterine fibroids, menorrhagia and abdominal pain.  EXAM: BILATERAL UTERINE ARTERY EMBOLIZATION; ULTRASOUND GUIDANCE FOR VASCULAR ACCESS; BILATERAL PELVIC ANGIOGRAPHY  Physician: Stephan Minister. Anselm Pancoast, MD  FLUOROSCOPY TIME:  42 min and 30 seconds, 2924 mGy  MEDICATIONS AND MEDICAL HISTORY: 4 mg versed, 200 mcg fentanyl, 2 g Ancef, 20 mg Decadron and 30 mg Toradol. A radiology nurse monitored the patient for moderate sedation.  ANESTHESIA/SEDATION: Moderate sedation time: 120 minutes  PROCEDURE: Informed consent was obtained for the uterine artery embolization procedure. The patient was placed supine on the interventional table. The right groin was prepped and draped in a sterile fashion. Maximal barrier  sterile technique was utilized including caps, mask, sterile gowns, sterile gloves, sterile drape, hand hygiene and skin antiseptic. The skin was anesthetized 1% lidocaine. Using ultrasound guidance, 21 gauge needle was directed in the right common femoral artery and a micropuncture dilator set was placed. The vascular access was upsized to a 5-French vascular sheath. A Cobra catheter was used to cannulate the left common iliac artery and the left internal iliac artery. A series of arteriograms were performed to identify the left uterine artery orfice. A Progreat microcatheter was advanced into the left uterine artery. 2 vials of 500 - 700 micron Embospheres and 3 vials of 700-900 micron Embospheres were injected through the microcatheter with fluoroscopic guidance. There was near stasis of the left uterine artery following administration of the particles. Post embolization arteriography was performed to document near stasis of the left uterine artery.  A Waltman's loop was formed using the Cobra catheter and the right internal iliac artery was cannulated. The right uterine artery was identified with contrast angiograms. The microcatheter was advanced into the right uterine artery and a series of angiograms were performed. 3 vials of 500-700 micron Embospheres and 1/2 vial of 700-900 micron Embospheres were injected through the microcatheter with fluoroscopic guidance. The particles were injected until there was near stasis of the right uterine artery. Post embolization arteriography was performed to document near stasis of the right uterine artery.  The microcatheter was removed. The Cobra catheter was straightened out over the aortic bifurcation and removed over a wire. Angiogram performed through the right groin sheath. The vascular sheath was removed with an Exoseal closure device.  FINDINGS: Large uterine arteries bilaterally. Left uterine artery is predominantly supplying the lower uterine segment. Right  uterine artery is supplying the fundus. Near stasis of the uterine arteries at the end of the procedure. Fluoroscopic images were obtained for documentation.  COMPLICATIONS: None  IMPRESSION: Successful bilateral uterine artery embolization procedure.   Electronically Signed   By: Markus Daft M.D.   On: 06/02/2014 18:50   Ir Angiogram Pelvis Selective Or Supraselective  06/02/2014   INDICATION: 43 year old with uterine fibroids, menorrhagia and abdominal pain.  EXAM: BILATERAL UTERINE ARTERY EMBOLIZATION; ULTRASOUND GUIDANCE FOR VASCULAR ACCESS; BILATERAL PELVIC ANGIOGRAPHY  Physician: Stephan Minister. Anselm Pancoast, MD  FLUOROSCOPY TIME:  42 min and 30 seconds, 2924 mGy  MEDICATIONS AND MEDICAL HISTORY: 4 mg versed, 200 mcg fentanyl, 2 g Ancef, 20 mg Decadron and 30 mg Toradol. A radiology nurse monitored the patient for moderate sedation.  ANESTHESIA/SEDATION: Moderate sedation time: 120 minutes  PROCEDURE: Informed consent was obtained for the uterine artery embolization procedure. The patient was placed supine on the interventional table. The right groin was prepped and draped in a sterile fashion. Maximal barrier sterile technique was utilized including caps, mask, sterile gowns, sterile gloves, sterile drape, hand hygiene and skin antiseptic. The skin was anesthetized 1% lidocaine. Using ultrasound guidance, 21 gauge needle was directed in the right common femoral artery and a micropuncture dilator set was placed. The vascular access was upsized  to a 5-French vascular sheath. A Cobra catheter was used to cannulate the left common iliac artery and the left internal iliac artery. A series of arteriograms were performed to identify the left uterine artery orfice. A Progreat microcatheter was advanced into the left uterine artery. 2 vials of 500 - 700 micron Embospheres and 3 vials of 700-900 micron Embospheres were injected through the microcatheter with fluoroscopic guidance. There was near stasis of the left uterine artery  following administration of the particles. Post embolization arteriography was performed to document near stasis of the left uterine artery.  A Waltman's loop was formed using the Cobra catheter and the right internal iliac artery was cannulated. The right uterine artery was identified with contrast angiograms. The microcatheter was advanced into the right uterine artery and a series of angiograms were performed. 3 vials of 500-700 micron Embospheres and 1/2 vial of 700-900 micron Embospheres were injected through the microcatheter with fluoroscopic guidance. The particles were injected until there was near stasis of the right uterine artery. Post embolization arteriography was performed to document near stasis of the right uterine artery.  The microcatheter was removed. The Cobra catheter was straightened out over the aortic bifurcation and removed over a wire. Angiogram performed through the right groin sheath. The vascular sheath was removed with an Exoseal closure device.  FINDINGS: Large uterine arteries bilaterally. Left uterine artery is predominantly supplying the lower uterine segment. Right uterine artery is supplying the fundus. Near stasis of the uterine arteries at the end of the procedure. Fluoroscopic images were obtained for documentation.  COMPLICATIONS: None  IMPRESSION: Successful bilateral uterine artery embolization procedure.   Electronically Signed   By: Markus Daft M.D.   On: 06/02/2014 18:50   Ir Angiogram Selective Each Additional Vessel  06/02/2014   INDICATION: 43 year old with uterine fibroids, menorrhagia and abdominal pain.  EXAM: BILATERAL UTERINE ARTERY EMBOLIZATION; ULTRASOUND GUIDANCE FOR VASCULAR ACCESS; BILATERAL PELVIC ANGIOGRAPHY  Physician: Stephan Minister. Anselm Pancoast, MD  FLUOROSCOPY TIME:  42 min and 30 seconds, 2924 mGy  MEDICATIONS AND MEDICAL HISTORY: 4 mg versed, 200 mcg fentanyl, 2 g Ancef, 20 mg Decadron and 30 mg Toradol. A radiology nurse monitored the patient for moderate  sedation.  ANESTHESIA/SEDATION: Moderate sedation time: 120 minutes  PROCEDURE: Informed consent was obtained for the uterine artery embolization procedure. The patient was placed supine on the interventional table. The right groin was prepped and draped in a sterile fashion. Maximal barrier sterile technique was utilized including caps, mask, sterile gowns, sterile gloves, sterile drape, hand hygiene and skin antiseptic. The skin was anesthetized 1% lidocaine. Using ultrasound guidance, 21 gauge needle was directed in the right common femoral artery and a micropuncture dilator set was placed. The vascular access was upsized to a 5-French vascular sheath. A Cobra catheter was used to cannulate the left common iliac artery and the left internal iliac artery. A series of arteriograms were performed to identify the left uterine artery orfice. A Progreat microcatheter was advanced into the left uterine artery. 2 vials of 500 - 700 micron Embospheres and 3 vials of 700-900 micron Embospheres were injected through the microcatheter with fluoroscopic guidance. There was near stasis of the left uterine artery following administration of the particles. Post embolization arteriography was performed to document near stasis of the left uterine artery.  A Waltman's loop was formed using the Cobra catheter and the right internal iliac artery was cannulated. The right uterine artery was identified with contrast angiograms. The microcatheter was advanced into the right  uterine artery and a series of angiograms were performed. 3 vials of 500-700 micron Embospheres and 1/2 vial of 700-900 micron Embospheres were injected through the microcatheter with fluoroscopic guidance. The particles were injected until there was near stasis of the right uterine artery. Post embolization arteriography was performed to document near stasis of the right uterine artery.  The microcatheter was removed. The Cobra catheter was straightened out over the  aortic bifurcation and removed over a wire. Angiogram performed through the right groin sheath. The vascular sheath was removed with an Exoseal closure device.  FINDINGS: Large uterine arteries bilaterally. Left uterine artery is predominantly supplying the lower uterine segment. Right uterine artery is supplying the fundus. Near stasis of the uterine arteries at the end of the procedure. Fluoroscopic images were obtained for documentation.  COMPLICATIONS: None  IMPRESSION: Successful bilateral uterine artery embolization procedure.   Electronically Signed   By: Markus Daft M.D.   On: 06/02/2014 18:50   Ir Angiogram Selective Each Additional Vessel  06/02/2014   INDICATION: 43 year old with uterine fibroids, menorrhagia and abdominal pain.  EXAM: BILATERAL UTERINE ARTERY EMBOLIZATION; ULTRASOUND GUIDANCE FOR VASCULAR ACCESS; BILATERAL PELVIC ANGIOGRAPHY  Physician: Stephan Minister. Anselm Pancoast, MD  FLUOROSCOPY TIME:  42 min and 30 seconds, 2924 mGy  MEDICATIONS AND MEDICAL HISTORY: 4 mg versed, 200 mcg fentanyl, 2 g Ancef, 20 mg Decadron and 30 mg Toradol. A radiology nurse monitored the patient for moderate sedation.  ANESTHESIA/SEDATION: Moderate sedation time: 120 minutes  PROCEDURE: Informed consent was obtained for the uterine artery embolization procedure. The patient was placed supine on the interventional table. The right groin was prepped and draped in a sterile fashion. Maximal barrier sterile technique was utilized including caps, mask, sterile gowns, sterile gloves, sterile drape, hand hygiene and skin antiseptic. The skin was anesthetized 1% lidocaine. Using ultrasound guidance, 21 gauge needle was directed in the right common femoral artery and a micropuncture dilator set was placed. The vascular access was upsized to a 5-French vascular sheath. A Cobra catheter was used to cannulate the left common iliac artery and the left internal iliac artery. A series of arteriograms were performed to identify the left uterine  artery orfice. A Progreat microcatheter was advanced into the left uterine artery. 2 vials of 500 - 700 micron Embospheres and 3 vials of 700-900 micron Embospheres were injected through the microcatheter with fluoroscopic guidance. There was near stasis of the left uterine artery following administration of the particles. Post embolization arteriography was performed to document near stasis of the left uterine artery.  A Waltman's loop was formed using the Cobra catheter and the right internal iliac artery was cannulated. The right uterine artery was identified with contrast angiograms. The microcatheter was advanced into the right uterine artery and a series of angiograms were performed. 3 vials of 500-700 micron Embospheres and 1/2 vial of 700-900 micron Embospheres were injected through the microcatheter with fluoroscopic guidance. The particles were injected until there was near stasis of the right uterine artery. Post embolization arteriography was performed to document near stasis of the right uterine artery.  The microcatheter was removed. The Cobra catheter was straightened out over the aortic bifurcation and removed over a wire. Angiogram performed through the right groin sheath. The vascular sheath was removed with an Exoseal closure device.  FINDINGS: Large uterine arteries bilaterally. Left uterine artery is predominantly supplying the lower uterine segment. Right uterine artery is supplying the fundus. Near stasis of the uterine arteries at the end of the procedure. Fluoroscopic  images were obtained for documentation.  COMPLICATIONS: None  IMPRESSION: Successful bilateral uterine artery embolization procedure.   Electronically Signed   By: Markus Daft M.D.   On: 06/02/2014 18:50   Ir US Guide Vasc Access Right  06/02/2014   INDICATION: 43 year old with uterine fibroids, menorrhagia and abdominal pain.  EXAM: BILATERAL UTERINE ARTERY EMBOLIZATION; ULTRASOUND GUIDANCE FOR VASCULAR ACCESS; BILATERAL  PELVIC ANGIOGRAPHY  Physician: Stephan Minister. Anselm Pancoast, MD  FLUOROSCOPY TIME:  42 min and 30 seconds, 2924 mGy  MEDICATIONS AND MEDICAL HISTORY: 4 mg versed, 200 mcg fentanyl, 2 g Ancef, 20 mg Decadron and 30 mg Toradol. A radiology nurse monitored the patient for moderate sedation.  ANESTHESIA/SEDATION: Moderate sedation time: 120 minutes  PROCEDURE: Informed consent was obtained for the uterine artery embolization procedure. The patient was placed supine on the interventional table. The right groin was prepped and draped in a sterile fashion. Maximal barrier sterile technique was utilized including caps, mask, sterile gowns, sterile gloves, sterile drape, hand hygiene and skin antiseptic. The skin was anesthetized 1% lidocaine. Using ultrasound guidance, 21 gauge needle was directed in the right common femoral artery and a micropuncture dilator set was placed. The vascular access was upsized to a 5-French vascular sheath. A Cobra catheter was used to cannulate the left common iliac artery and the left internal iliac artery. A series of arteriograms were performed to identify the left uterine artery orfice. A Progreat microcatheter was advanced into the left uterine artery. 2 vials of 500 - 700 micron Embospheres and 3 vials of 700-900 micron Embospheres were injected through the microcatheter with fluoroscopic guidance. There was near stasis of the left uterine artery following administration of the particles. Post embolization arteriography was performed to document near stasis of the left uterine artery.  A Waltman's loop was formed using the Cobra catheter and the right internal iliac artery was cannulated. The right uterine artery was identified with contrast angiograms. The microcatheter was advanced into the right uterine artery and a series of angiograms were performed. 3 vials of 500-700 micron Embospheres and 1/2 vial of 700-900 micron Embospheres were injected through the microcatheter with fluoroscopic guidance.  The particles were injected until there was near stasis of the right uterine artery. Post embolization arteriography was performed to document near stasis of the right uterine artery.  The microcatheter was removed. The Cobra catheter was straightened out over the aortic bifurcation and removed over a wire. Angiogram performed through the right groin sheath. The vascular sheath was removed with an Exoseal closure device.  FINDINGS: Large uterine arteries bilaterally. Left uterine artery is predominantly supplying the lower uterine segment. Right uterine artery is supplying the fundus. Near stasis of the uterine arteries at the end of the procedure. Fluoroscopic images were obtained for documentation.  COMPLICATIONS: None  IMPRESSION: Successful bilateral uterine artery embolization procedure.   Electronically Signed   By: Markus Daft M.D.   On: 06/02/2014 18:50   Ir Embo Tumor Organ Ischemia Infarct Inc Guide Roadmapping  06/02/2014   INDICATION: 43 year old with uterine fibroids, menorrhagia and abdominal pain.  EXAM: BILATERAL UTERINE ARTERY EMBOLIZATION; ULTRASOUND GUIDANCE FOR VASCULAR ACCESS; BILATERAL PELVIC ANGIOGRAPHY  Physician: Stephan Minister. Anselm Pancoast, MD  FLUOROSCOPY TIME:  42 min and 30 seconds, 2924 mGy  MEDICATIONS AND MEDICAL HISTORY: 4 mg versed, 200 mcg fentanyl, 2 g Ancef, 20 mg Decadron and 30 mg Toradol. A radiology nurse monitored the patient for moderate sedation.  ANESTHESIA/SEDATION: Moderate sedation time: 120 minutes  PROCEDURE: Informed consent was obtained for  the uterine artery embolization procedure. The patient was placed supine on the interventional table. The right groin was prepped and draped in a sterile fashion. Maximal barrier sterile technique was utilized including caps, mask, sterile gowns, sterile gloves, sterile drape, hand hygiene and skin antiseptic. The skin was anesthetized 1% lidocaine. Using ultrasound guidance, 21 gauge needle was directed in the right common femoral artery  and a micropuncture dilator set was placed. The vascular access was upsized to a 5-French vascular sheath. A Cobra catheter was used to cannulate the left common iliac artery and the left internal iliac artery. A series of arteriograms were performed to identify the left uterine artery orfice. A Progreat microcatheter was advanced into the left uterine artery. 2 vials of 500 - 700 micron Embospheres and 3 vials of 700-900 micron Embospheres were injected through the microcatheter with fluoroscopic guidance. There was near stasis of the left uterine artery following administration of the particles. Post embolization arteriography was performed to document near stasis of the left uterine artery.  A Waltman's loop was formed using the Cobra catheter and the right internal iliac artery was cannulated. The right uterine artery was identified with contrast angiograms. The microcatheter was advanced into the right uterine artery and a series of angiograms were performed. 3 vials of 500-700 micron Embospheres and 1/2 vial of 700-900 micron Embospheres were injected through the microcatheter with fluoroscopic guidance. The particles were injected until there was near stasis of the right uterine artery. Post embolization arteriography was performed to document near stasis of the right uterine artery.  The microcatheter was removed. The Cobra catheter was straightened out over the aortic bifurcation and removed over a wire. Angiogram performed through the right groin sheath. The vascular sheath was removed with an Exoseal closure device.  FINDINGS: Large uterine arteries bilaterally. Left uterine artery is predominantly supplying the lower uterine segment. Right uterine artery is supplying the fundus. Near stasis of the uterine arteries at the end of the procedure. Fluoroscopic images were obtained for documentation.  COMPLICATIONS: None  IMPRESSION: Successful bilateral uterine artery embolization procedure.   Electronically  Signed   By: Markus Daft M.D.   On: 06/02/2014 18:50    Labs:  CBC:  Recent Labs  06/02/14 0805  WBC 5.6  HGB 8.6*  HCT 25.9*  PLT 297    COAGS:  Recent Labs  06/02/14 0805  INR 0.99    BMP:  Recent Labs  06/02/14 0805  NA 139  K 4.2  CL 106  CO2 20  GLUCOSE 97  BUN 16  CALCIUM 9.0  CREATININE 0.70  GFRNONAA >90  GFRAA >90    LIVER FUNCTION TESTS: No results for input(s): BILITOT, AST, ALT, ALKPHOS, PROT, ALBUMIN in the last 8760 hours.  TUMOR MARKERS: No results for input(s): AFPTM, CEA, CA199, CHROMGRNA in the last 8760 hours.  Assessment and Plan:  Post uterine artery embolization one week ago. The patient's abdominal discomfort and cramping is expected, especially since she has not been taking the anti-inflammatory medicine regularly. I advised the patient to take the 600 mg ibuprofen tablets 4 times a day until the prescription runs out. I am not surprised patient is having significant post-embolic syndrome related to this treatment because of the large size of her uterus and fibroids.  The patient is having a small amount of bloody vaginal discharge which is not unexpected. I am concerned that the discharge is foul-smelling. There was concern for a yeast infection at the time of discharge. I have  given the patient a prescription for doxycycline 100 mg twice a day. Explained to the patient and her daughter that if the discharge does not improve within the next 24 hours that she should go to the women's clinic for further evaluation.  Patient will contact us if new questions or problems arise. We will plan to follow up with the patient by telephone next week.    I spent a total of 15 minutes face to face in clinical consultation, greater than 50% of which was counseling/coordinating care for post procedure care.  SignedCarylon Perches 06/08/2014, 4:26 PM

## 2014-06-13 ENCOUNTER — Inpatient Hospital Stay (HOSPITAL_COMMUNITY)
Admission: AD | Admit: 2014-06-13 | Discharge: 2014-06-14 | Disposition: A | Discharge status: Home / Self Care | Payer: Self-pay | Source: Ambulatory Visit | Attending: Obstetrics & Gynecology | Admitting: Obstetrics & Gynecology

## 2014-06-13 ENCOUNTER — Inpatient Hospital Stay (HOSPITAL_COMMUNITY): Payer: Self-pay

## 2014-06-13 ENCOUNTER — Encounter (HOSPITAL_COMMUNITY): Payer: Self-pay | Admitting: *Deleted

## 2014-06-13 DIAGNOSIS — F17210 Nicotine dependence, cigarettes, uncomplicated: Secondary | ICD-10-CM | POA: Insufficient documentation

## 2014-06-13 DIAGNOSIS — D259 Leiomyoma of uterus, unspecified: Secondary | ICD-10-CM | POA: Insufficient documentation

## 2014-06-13 DIAGNOSIS — R1030 Lower abdominal pain, unspecified: Secondary | ICD-10-CM | POA: Insufficient documentation

## 2014-06-13 LAB — WET PREP, GENITAL
Trich, Wet Prep: NONE SEEN
WBC, Wet Prep HPF POC: NONE SEEN
Yeast Wet Prep HPF POC: NONE SEEN

## 2014-06-13 MED ORDER — HYDROMORPHONE HCL 1 MG/ML IJ SOLN
1.0000 mg | Freq: Once | INTRAMUSCULAR | Status: AC
Start: 2014-06-13 — End: 2014-06-13
  Administered 2014-06-13: 1 mg via INTRAMUSCULAR
  Filled 2014-06-13: qty 1

## 2014-06-13 NOTE — MAU Note (Signed)
PT SAYSS SHE   HAD SURGERY ON 11-19    - REMOVED FIBROIDS-     DR    CONSTANT.     [place]-  Friday 11-20.     WENT  TO DR  ON 11-27.   SAYS   FEELS  VERY SORE.    TAKES PAIN  MED- VICODIN     SAYS  HAS SMALL  INCISION ON SKIN-   BUT PAIN IS INSIDE.   NO V/D.

## 2014-06-13 NOTE — MAU Note (Signed)
Pt states she is also having discharge that started after surgery.

## 2014-06-13 NOTE — MAU Note (Signed)
SAY   CANNOT  COLLECT   UA

## 2014-06-13 NOTE — MAU Provider Note (Signed)
History     CSN: [id number]  Arrival date and time: 06/13/14 2238   First Provider Initiated Contact with Patient 06/13/14 2310      No chief complaint on file.  HPI Carol Mack is a 43 y.o. [id number] who presents to MAU today with complaint of lower abdominal pain. The patient had [place] on 06/02/14 performed by IR at [facility]. She states that she has returned to them because of pain following the procedure and was put on Doxycycline and Vicodin. She states that she has stopped taking Vicodin because it doesn't make her feel well. She states that it also doesn't help her pain. She states scant vaginal bleeding and a malodorous discharge. She denies fever.   OB History    Gravida Para Term Preterm AB TAB SAB Ectopic Multiple Living   2 2 2       2       Past Medical History  Diagnosis Date  . Bartholin cyst   . Hernia, inguinal   . Hernia, inguinal     Past Surgical History  Procedure Laterality Date  . No past surgeries      No family history on file.  History  Substance Use Topics  . Smoking status: Current Every Day Smoker -- 1.00 packs/day    Types: Cigarettes  . Smokeless tobacco: Not on file  . Alcohol Use: No    Allergies: No Known Allergies  Prescriptions prior to admission  Medication Sig Dispense Refill Last Dose  . albuterol (PROVENTIL HFA;VENTOLIN HFA) 108 (90 BASE) MCG/ACT inhaler Inhale 2 puffs into the lungs every 6 (six) hours as needed for shortness of breath.   Past Month at Unknown time  . ibuprofen (ADVIL,MOTRIN) 200 MG tablet Take 800 mg by mouth every 6 (six) hours as needed for moderate pain.   06/01/2014 at Unknown time  . ondansetron (ZOFRAN ODT) 4 MG disintegrating tablet Take 1 tablet (4 mg total) by mouth every 8 (eight) hours as needed for nausea or vomiting. 10 tablet 0 More than a month at Unknown time    Review of Systems  Constitutional: Negative for fever and malaise/fatigue.  Gastrointestinal: Positive for nausea and abdominal  pain. Negative for vomiting, diarrhea and constipation.  Genitourinary:       + vaginal bleeding, discharge   Physical Exam   Blood pressure 129/89, pulse 98, temperature 99.1 F (37.3 C), temperature source Oral, resp. rate 20, height 5\' 2"  (1.575 m), weight 131 lb 8 oz (59.648 kg), last menstrual period 05/16/2014.  Physical Exam  Constitutional: She is oriented to person, place, and time. She appears well-developed and well-nourished. No distress.  HENT:  Head: Normocephalic.  Cardiovascular: Normal rate.   Respiratory: Effort normal.  GI: Soft. She exhibits mass (large uterine fibroids). She exhibits no distension. There is tenderness (moderate tenderness to palpation of the lower abdomen). There is guarding. There is no rebound.  Genitourinary:  Patient refused speculum exam. Wet prep and GC/Chlamydia were obtained without speculum.   Neurological: She is alert and oriented to person, place, and time.  Skin: Skin is warm and dry. No erythema.  Psychiatric: She has a normal mood and affect.    MAU Course  Procedures None  Results for orders placed or performed during the hospital encounter of 06/13/14 (from the past 24 hour(s))  CBC     Status: Abnormal   Collection Time: 06/13/14 11:15 PM  Result Value Ref Range   WBC 12.4 (H) 4.0 - 10.5 K/uL  RBC 5.45 (H) 3.87 - 5.11 MIL/uL   Hemoglobin 9.5 (L) 12.0 - 15.0 g/dL   HCT 27.8 (L) 36.0 - 46.0 %   MCV 51.0 (L) 78.0 - 100.0 fL   MCH 17.4 (L) 26.0 - 34.0 pg   MCHC 34.2 30.0 - 36.0 g/dL   RDW 22.6 (H) 11.5 - 15.5 %   Platelets 647 (H) 150 - 400 K/uL  Wet prep, genital     Status: Abnormal   Collection Time: 06/13/14 11:15 PM  Result Value Ref Range   Yeast Wet Prep HPF POC NONE SEEN NONE SEEN   Trich, Wet Prep NONE SEEN NONE SEEN   Clue Cells Wet Prep HPF POC FEW (A) NONE SEEN   WBC, Wet Prep HPF POC NONE SEEN NONE SEEN  Urinalysis, Routine w reflex microscopic     Status: Abnormal   Collection Time: 06/13/14 11:25 PM   Result Value Ref Range   Color, Urine YELLOW YELLOW   APPearance CLEAR CLEAR   Specific Gravity, Urine 1.030 1.005 - 1.030   pH 5.5 5.0 - 8.0   Glucose, UA NEGATIVE NEGATIVE mg/dL   Hgb urine dipstick TRACE (A) NEGATIVE   Bilirubin Urine NEGATIVE NEGATIVE   Ketones, ur NEGATIVE NEGATIVE mg/dL   Protein, ur NEGATIVE NEGATIVE mg/dL   Urobilinogen, UA 0.2 0.0 - 1.0 mg/dL   Nitrite NEGATIVE NEGATIVE   Leukocytes, UA NEGATIVE NEGATIVE  Urine microscopic-add on     Status: Abnormal   Collection Time: 06/13/14 11:25 PM  Result Value Ref Range   Squamous Epithelial / LPF FEW (A) RARE   WBC, UA 0-2 <3 WBC/hpf   RBC / HPF 0-2 <3 RBC/hpf   Bacteria, UA FEW (A) RARE   US Pelvis Complete  06/14/2014   CLINICAL DATA:  43 year old female with pelvic pain. Patient with uterine artery embolization for fibroids on 06/02/2014. Initial encounter.  EXAM: TRANSABDOMINAL ULTRASOUND OF PELVIS  TECHNIQUE: Transabdominal ultrasound examination of the pelvis was performed including evaluation of the uterus, ovaries, adnexal regions, and pelvic cul-de-sac.  The patient refused a transvaginal exam due to pelvic pain.  COMPARISON:  09/17/2011 CT, 11/30/2013 ultrasound and 02/22/2014 MR  FINDINGS: Uterus  Measurements: 18.4 x 11 x 16.6 cm. Multiple fibroids are again identified, some which contain gas, compatible with recent uterine artery embolization. These include the following:  A 5.7 x 5.2 x 5.1 cm pedunculated right fundal fibroid.  A 5.7 x 5.1 x 5.7 cm pedunculated fundal fibroid.  A 5.4 x 4.2 x 6 cm sub serosal fundal fibroid.  An 11.5 x 8.3 x 10.1 cm uterine body fibroid.  Endometrium  Thickness: 13 mm.  No focal abnormality visualized.  Right ovary  Measurements: 2.6 x 1.8 x 2.7 cm. Normal appearance/no adnexal mass.  Left ovary  Measurements: Not visualized.  Other findings:  No free fluid  IMPRESSION: Multiple uterine fibroids again identified, some which now contain gas compatible with fibroid necrosis from  recent uterine artery embolization. If there is strong clinical suspicion for other acute process, elective MRI of the pelvis with contrast is recommended.  Normal right ovary.  Left ovary not visualized.   Electronically Signed   By: Hassan Rowan M.D.   On: 06/14/2014 01:21    MDM UA, wet prep, GC/Chlamydia and CBC and pelvic US today 1 mg IM Dilaudid given in MAU 2326 - results pending. Patient waiting for radiology. Care turned over to [facility], CNM  Luvenia Redden, PA-C 06/13/2014, 11:26 PM  Assessment and Plan  1. Fibroid uterus    Pain from fibroids degenerating Toradol Percocet PRN (#20 given, patient states she doesn't have any more at home) Return to MAU as needed  Follow-up Information    Follow up with ALLRED,D KEVIN, PA-C.   Specialty:  Radiology   Why:  If symptoms worsen   Contact information:   [address] [id number] [phone]

## 2014-06-14 ENCOUNTER — Telehealth: Payer: Self-pay | Admitting: Emergency Medicine

## 2014-06-14 ENCOUNTER — Telehealth: Payer: Self-pay | Admitting: Radiology

## 2014-06-14 DIAGNOSIS — D259 Leiomyoma of uterus, unspecified: Secondary | ICD-10-CM

## 2014-06-14 LAB — HIV ANTIBODY (ROUTINE TESTING W REFLEX): HIV 1&2 Ab, 4th Generation: NONREACTIVE

## 2014-06-14 LAB — URINE MICROSCOPIC-ADD ON

## 2014-06-14 LAB — URINALYSIS, ROUTINE W REFLEX MICROSCOPIC
Bilirubin Urine: NEGATIVE
Glucose, UA: NEGATIVE mg/dL
Ketones, ur: NEGATIVE mg/dL
Leukocytes, UA: NEGATIVE
Nitrite: NEGATIVE
Protein, ur: NEGATIVE mg/dL
Specific Gravity, Urine: 1.03 (ref 1.005–1.030)
Urobilinogen, UA: 0.2 mg/dL (ref 0.0–1.0)
pH: 5.5 (ref 5.0–8.0)

## 2014-06-14 LAB — CBC
HCT: 27.8 % — ABNORMAL LOW (ref 36.0–46.0)
Hemoglobin: 9.5 g/dL — ABNORMAL LOW (ref 12.0–15.0)
MCH: 17.4 pg — ABNORMAL LOW (ref 26.0–34.0)
MCHC: 34.2 g/dL (ref 30.0–36.0)
MCV: 51 fL — ABNORMAL LOW (ref 78.0–100.0)
Platelets: 647 10*3/uL — ABNORMAL HIGH (ref 150–400)
RBC: 5.45 MIL/uL — ABNORMAL HIGH (ref 3.87–5.11)
RDW: 22.6 % — ABNORMAL HIGH (ref 11.5–15.5)
WBC: 12.4 10*3/uL — ABNORMAL HIGH (ref 4.0–10.5)

## 2014-06-14 LAB — GC/CHLAMYDIA PROBE AMP
CT Probe RNA: NEGATIVE
GC Probe RNA: NEGATIVE

## 2014-06-14 MED ORDER — KETOROLAC TROMETHAMINE 60 MG/2ML IM SOLN
60.0000 mg | Freq: Once | INTRAMUSCULAR | Status: AC
  Administered 2014-06-14: 60 mg via INTRAMUSCULAR
  Filled 2014-06-14: qty 2

## 2014-06-14 MED ORDER — OXYCODONE-ACETAMINOPHEN 5-325 MG PO TABS: 1.0000 | ORAL_TABLET | ORAL | Status: DC | PRN

## 2014-06-14 MED ORDER — KETOROLAC TROMETHAMINE 10 MG PO TABS: 10.0000 mg | ORAL_TABLET | Freq: Four times a day (QID) | ORAL | Status: DC | PRN

## 2014-06-14 NOTE — Discharge Instructions (Signed)
Fibroids Fibroids are lumps (tumors) that can occur any place in a woman's body. These lumps are not cancerous. Fibroids vary in size, weight, and where they grow. HOME CARE  Do not take aspirin.  Write down the number of pads or tampons you use during your period. Tell your doctor. This can help determine the best treatment for you. GET HELP RIGHT AWAY IF:  You have pain in your lower belly (abdomen) that is not helped with medicine.  You have cramps that are not helped with medicine.  You have more bleeding between or during your period.  You feel lightheaded or pass out (faint).  Your lower belly pain gets worse. MAKE SURE YOU:  Understand these instructions.  Will watch your condition.  Will get help right away if you are not doing well or get worse. Document Released: 08/03/2010 Document Revised: 09/23/2011 Document Reviewed: 08/03/2010 ExitCare Patient Information 2015 [employer]. This information is not intended to replace advice given to you by your health care provider. Make sure you discuss any questions you have with your health care provider.  

## 2014-06-14 NOTE — Telephone Encounter (Signed)
Pt returned our call for status update.   Pt states that she went to the [facility] last night for bad cramping and pain.  Her symptoms are not any better.   [facility] did do pelvic exam and [place]. Notes in EPIC.   I will contact Dr Anselm Pancoast to see what he wants to do at this point.   Dr Anselm Pancoast to call pt on Wed.

## 2014-06-14 NOTE — Telephone Encounter (Signed)
Left message on voice mail requesting patient call regarding status post [place].  Jannat Rosemeyer Riki Rusk, RN 06/14/2014 11:19 AM

## 2014-06-15 ENCOUNTER — Telehealth: Payer: Self-pay | Admitting: Obstetrics and Gynecology

## 2014-06-15 NOTE — Telephone Encounter (Signed)
I received a message to call patient regarding her need for explanation of discharge summary/instructions. No answer when called.

## 2014-06-21 NOTE — Progress Notes (Signed)
Left message on patient's home phone asking her to give [place] a call for a status update and to see if she would like to see Dr. Anselm Pancoast in F/U tomorrow.  Brita Romp, RN

## 2014-07-12 ENCOUNTER — Other Ambulatory Visit (HOSPITAL_COMMUNITY): Payer: Self-pay | Admitting: Diagnostic Radiology

## 2014-07-12 DIAGNOSIS — D259 Leiomyoma of uterus, unspecified: Secondary | ICD-10-CM

## 2014-07-19 ENCOUNTER — Telehealth: Payer: Self-pay | Admitting: *Deleted

## 2014-07-19 DIAGNOSIS — N760 Acute vaginitis: Principal | ICD-10-CM

## 2014-07-19 DIAGNOSIS — B9689 Other specified bacterial agents as the cause of diseases classified elsewhere: Secondary | ICD-10-CM

## 2014-07-19 NOTE — Telephone Encounter (Signed)
Pt left message requesting Rx because she is having vaginal discharge after surgery.

## 2014-07-20 NOTE — Telephone Encounter (Signed)
Called patient, no answer- left message stating we are trying to return your phone call, please call us back at the clinics

## 2014-07-21 MED ORDER — METRONIDAZOLE 0.75 % VA GEL: 1.0000 | Freq: Every day | VAGINAL | Status: AC

## 2014-07-21 NOTE — Telephone Encounter (Signed)
Patient returned called and stated that she needs a prescription called in for [place] at [facility] on [facility].  Called patient back and she reported vaginal discharge with odor similar to bv infections she has had before. She requested metrogel. Rx sent in per protocol.

## 2014-07-22 ENCOUNTER — Telehealth: Payer: Self-pay | Admitting: *Deleted

## 2014-07-22 DIAGNOSIS — B9689 Other specified bacterial agents as the cause of diseases classified elsewhere: Secondary | ICD-10-CM

## 2014-07-22 DIAGNOSIS — N760 Acute vaginitis: Principal | ICD-10-CM

## 2014-07-22 MED ORDER — TINIDAZOLE 500 MG PO TABS: 2.0000 g | ORAL_TABLET | Freq: Every day | ORAL | Status: DC

## 2014-07-22 NOTE — Telephone Encounter (Signed)
Pt left message that the gel was $94 and she cannot take Flagyl  Contacted patient after speaking with Pharmacy.  Cost of gel medication has risen from $30 to $94 and the cost of the Tindamax is $17.33.  Tindamax ordered.  Pt verbalizes understanding.

## 2014-07-22 NOTE — Telephone Encounter (Signed)
Pt contacted the clinic stating that her prescription was too expensive and request an alternative medication that is cheaper.   Attempted to contact patient, no answer, left message for patient to call clinic to discuss what medication is too expensive.

## 2014-07-27 ENCOUNTER — Encounter: Payer: Self-pay | Admitting: Emergency Medicine

## 2014-07-27 ENCOUNTER — Ambulatory Visit
Admission: RE | Admit: 2014-07-27 | Discharge: 2014-07-27 | Disposition: A | Discharge status: Home / Self Care | Payer: Self-pay | Source: Ambulatory Visit | Attending: Diagnostic Radiology | Admitting: Diagnostic Radiology

## 2014-07-27 DIAGNOSIS — D259 Leiomyoma of uterus, unspecified: Secondary | ICD-10-CM

## 2014-07-27 HISTORY — PX: IR GENERIC HISTORICAL: IMG1180011

## 2014-07-27 NOTE — Progress Notes (Signed)
LMP:  07/13/2014 x 5 days.  Light flow.  Denies cramping.  Occasional minimal spotting between cycles.  Weight:  Down 8 lbs from preprocedure.  6 mo follow up late May/early June 2016.     Ruqayyah Lute Riki Rusk, RN 07/27/2014 11:38 AM

## 2014-07-27 NOTE — Consult Note (Signed)
Chief Complaint: Chief Complaint  Patient presents with  . Follow-up    2 mo follow up [place]    Referring Physician(s): Anjelika Ausburn Ryan  History of Present Illness: Carol Mack is a 44 y.o. female with uterine fibroids and status post [place] on 06/02/14.  The patient experienced a lot of pain and discomfort immediate following the procedure but now she is feeling much better.  Her last menstrual period was light and only lasted 5 days.  The pelvic and back pain has also decreased since the [place].  The patient still has a foul smelling vaginal discharge which is being followed by gynecology.  She is scheduled to see gynecology again next week.  Patient has returned to work.    Past Medical History  Diagnosis Date  . Bartholin cyst   . Hernia, inguinal   . Hernia, inguinal     Past Surgical History  Procedure Laterality Date  . No past surgeries      Allergies: Review of patient's allergies indicates no known allergies.  Medications: Prior to Admission medications   Medication Sig Start Date End Date Taking? Authorizing Provider  albuterol (PROVENTIL HFA;VENTOLIN HFA) 108 (90 BASE) MCG/ACT inhaler Inhale 2 puffs into the lungs every 6 (six) hours as needed for shortness of breath.    Historical Provider, MD  ibuprofen (ADVIL,MOTRIN) 200 MG tablet Take 800 mg by mouth every 6 (six) hours as needed for moderate pain.    Historical Provider, MD  ketorolac (TORADOL) 10 MG tablet Take 1 tablet (10 mg total) by mouth every 6 (six) hours as needed. 06/14/14   Heather Erby Pian, CNM  ondansetron (ZOFRAN ODT) 4 MG disintegrating tablet Take 1 tablet (4 mg total) by mouth every 8 (eight) hours as needed for nausea or vomiting. 03/04/14   Carlisle Cater, PA-C  oxyCODONE-acetaminophen (PERCOCET/ROXICET) 5-325 MG per tablet Take 1-2 tablets by mouth every 4 (four) hours as needed for moderate pain or severe pain. 06/14/14   Heather Erby Pian, CNM  tinidazole (TINDAMAX) 500 MG tablet Take 4  tablets (2,000 mg total) by mouth daily with breakfast. For 2 days 07/22/14   Truett Mainland, DO    No family history on file.  History   Social History  . Marital Status: Single    Spouse Name: N/A    Number of Children: N/A  . Years of Education: N/A   Social History Main Topics  . Smoking status: Current Every Day Smoker -- 1.00 packs/day    Types: Cigarettes  . Smokeless tobacco: Not on file  . Alcohol Use: No  . Drug Use: No  . Sexual Activity: Yes    Birth Control/ Protection: None   Other Topics Concern  . Not on file   Social History Narrative     Review of Systems  Genitourinary:       Small amount of intermittent dark vaginal discharge. Dark urine,    Vital Signs: BP 133/103 mmHg  Pulse 67  Temp(Src) 98.2 F (36.8 C) (Oral)  Resp 14  Wt 138 lb 3.2 oz (62.687 kg)  SpO2 100%  LMP 07/13/2014 (Approximate)  Physical Exam  Abdominal: Soft.  Enlarged uterus and palplable exophytic fibroid along anterior pelvis. Minimal tenderness.    Imaging: No results found.  Labs:  CBC:  Recent Labs  06/02/14 0805 06/13/14 2315  WBC 5.6 12.4*  HGB 8.6* 9.5*  HCT 25.9* 27.8*  PLT 297 647*    COAGS:  Recent Labs  06/02/14 0805  INR  0.99    BMP:  Recent Labs  06/02/14 0805  NA 139  K 4.2  CL 106  CO2 20  GLUCOSE 97  BUN 16  CALCIUM 9.0  CREATININE 0.70  GFRNONAA >90  GFRAA >90     Assessment and Plan:  Post [place] 2 months ago.  The patient is much better than immediately following the [place].  Her last menstrual period was light and patient has decreased pelvic and back since the [place].  Persistent vaginal discharge.  This is being followed by gynecology.  The patient reports similar problems prior to [place] but suspect some of the discharge could be from fibroid necrosis.  Appreciate gynecology's assistance.  Will see patient back in 6 months and plan for follow-up pelvic MRI.     I spent a total of 10 minutes face to face in clinical  consultation, greater than 50% of which was counseling/coordinating care for uterine fibroids  Signed: Carylon Perches 07/27/2014, 5:46 PM

## 2014-08-04 ENCOUNTER — Telehealth: Payer: Self-pay | Admitting: General Practice

## 2014-08-04 ENCOUNTER — Telehealth: Payer: Self-pay | Admitting: Family Medicine

## 2014-08-04 NOTE — Telephone Encounter (Signed)
Called patient, no answer- left message stating we are calling to let her know that our office will be closed tomorrow however we have already rescheduled her appt for 1/25 @ 245. If she is unable to make this appt or have any questions please call us back on Monday.

## 2014-08-04 NOTE — Telephone Encounter (Signed)
Left message informing patient that office will be closed and that we would call her to let her know of rescheduled appointment date.

## 2014-08-05 ENCOUNTER — Ambulatory Visit: Payer: Self-pay | Admitting: Family Medicine

## 2014-08-08 ENCOUNTER — Ambulatory Visit: Payer: Self-pay | Admitting: Family Medicine

## 2014-09-12 ENCOUNTER — Ambulatory Visit (INDEPENDENT_AMBULATORY_CARE_PROVIDER_SITE_OTHER): Payer: 59 | Admitting: Obstetrics & Gynecology

## 2014-09-12 ENCOUNTER — Encounter: Payer: Self-pay | Admitting: Obstetrics & Gynecology

## 2014-09-12 VITALS — BP 131/82 | HR 75 | Ht 64.0 in | Wt 140.8 lb

## 2014-09-12 DIAGNOSIS — A499 Bacterial infection, unspecified: Secondary | ICD-10-CM

## 2014-09-12 DIAGNOSIS — N760 Acute vaginitis: Secondary | ICD-10-CM

## 2014-09-12 DIAGNOSIS — B9689 Other specified bacterial agents as the cause of diseases classified elsewhere: Secondary | ICD-10-CM

## 2014-09-12 MED ORDER — METRONIDAZOLE 0.75 % VA GEL: 1.0000 | Freq: Every day | VAGINAL | Status: DC

## 2014-09-12 NOTE — Progress Notes (Signed)
Patient ID: Carol Mack, female   DOB: [birth year], 44 y.o.   MRN: [id number]  Chief Complaint  Patient presents with  . Vaginal Discharge    HPI Carol Mack is a 44 y.o. female.  Patient's last menstrual period was 08/15/2014 (approximate). [id number] Had [place] fibroids in 05/2014, menses much lighter but has sx of BV and doesn't tolerate oral medication  HPI  Past Medical History  Diagnosis Date  . Bartholin cyst   . Hernia, inguinal   . Hernia, inguinal     Past Surgical History  Procedure Laterality Date  . No past surgeries      No family history on file.  Social History History  Substance Use Topics  . Smoking status: Current Every Day Smoker -- 1.00 packs/day    Types: Cigarettes  . Smokeless tobacco: Not on file  . Alcohol Use: No    No Known Allergies  Current Outpatient Prescriptions  Medication Sig Dispense Refill  . albuterol (PROVENTIL HFA;VENTOLIN HFA) 108 (90 BASE) MCG/ACT inhaler Inhale 2 puffs into the lungs every 6 (six) hours as needed for shortness of breath.    Marland Kitchen ibuprofen (ADVIL,MOTRIN) 200 MG tablet Take 800 mg by mouth every 6 (six) hours as needed for moderate pain.    Marland Kitchen ketorolac (TORADOL) 10 MG tablet Take 1 tablet (10 mg total) by mouth every 6 (six) hours as needed. 20 tablet 0  . metroNIDAZOLE (METROGEL) 0.75 % vaginal gel Place 1 Applicatorful vaginally at bedtime. Apply one applicatorful to vagina at bedtime for 5 days 70 g 1  . ondansetron (ZOFRAN ODT) 4 MG disintegrating tablet Take 1 tablet (4 mg total) by mouth every 8 (eight) hours as needed for nausea or vomiting. 10 tablet 0  . oxyCODONE-acetaminophen (PERCOCET/ROXICET) 5-325 MG per tablet Take 1-2 tablets by mouth every 4 (four) hours as needed for moderate pain or severe pain. 15 tablet 0  . tinidazole (TINDAMAX) 500 MG tablet Take 4 tablets (2,000 mg total) by mouth daily with breakfast. For 2 days 8 tablet 0   No current facility-administered medications for this visit.     Review of Systems Review of Systems  Constitutional: Negative.   Genitourinary: Positive for vaginal discharge (odor). Negative for menstrual problem.  Neurological: Negative for weakness.    Blood pressure 131/82, pulse 75, height 5\' 4"  (1.626 m), weight 140 lb 12.8 oz (63.866 kg), last menstrual period 08/15/2014.  Physical Exam Physical Exam  Constitutional: She appears well-developed. No distress.  Pulmonary/Chest: Effort normal.  Abdominal: She exhibits mass (firm lower abd mass fibroids).  Genitourinary: Vaginal discharge (bleeding light wet prep) found.  Irregular 16 weeks uterus  Skin: Skin is warm and dry. No pallor.  Psychiatric: She has a normal mood and affect. Her behavior is normal.    Data Reviewed CBC    Component Value Date/Time   WBC 12.4* 06/13/2014 2315   RBC 5.45* 06/13/2014 2315   HGB 9.5* 06/13/2014 2315   HCT 27.8* 06/13/2014 2315   PLT 647* 06/13/2014 2315   MCV 51.0* 06/13/2014 2315   MCH 17.4* 06/13/2014 2315   MCHC 34.2 06/13/2014 2315   RDW 22.6* 06/13/2014 2315   LYMPHSABS 2.7 06/02/2014 0805   MONOABS 0.7 06/02/2014 0805   EOSABS 0.1 06/02/2014 0805   BASOSABS 0.0 06/02/2014 0805      Assessment    Fibroids and probable BV     Plan    Metrogel 5 days        ARNOLD,JAMES 09/12/2014, 2:06 PM

## 2014-09-12 NOTE — Patient Instructions (Signed)
Bacterial Vaginosis Bacterial vaginosis is a vaginal infection that occurs when the normal balance of bacteria in the vagina is disrupted. It results from an overgrowth of certain bacteria. This is the most common vaginal infection in women of childbearing age. Treatment is important to prevent complications, especially in pregnant women, as it can cause a premature delivery. CAUSES  Bacterial vaginosis is caused by an increase in harmful bacteria that are normally present in smaller amounts in the vagina. Several different kinds of bacteria can cause bacterial vaginosis. However, the reason that the condition develops is not fully understood. RISK FACTORS Certain activities or behaviors can put you at an increased risk of developing bacterial vaginosis, including:  Having a new sex partner or multiple sex partners.  Douching.  Using an intrauterine device (IUD) for contraception. Women do not get bacterial vaginosis from toilet seats, bedding, swimming pools, or contact with objects around them. SIGNS AND SYMPTOMS  Some women with bacterial vaginosis have no signs or symptoms. Common symptoms include:  Grey vaginal discharge.  A fishlike odor with discharge, especially after sexual intercourse.  Itching or burning of the vagina and vulva.  Burning or pain with urination. DIAGNOSIS  Your health care provider will take a medical history and examine the vagina for signs of bacterial vaginosis. A sample of vaginal fluid may be taken. Your health care provider will look at this sample under a microscope to check for bacteria and abnormal cells. A vaginal pH test may also be done.  TREATMENT  Bacterial vaginosis may be treated with antibiotic medicines. These may be given in the form of a pill or a vaginal cream. A second round of antibiotics may be prescribed if the condition comes back after treatment.  HOME CARE INSTRUCTIONS   Only take over-the-counter or prescription medicines as  directed by your health care provider.  If antibiotic medicine was prescribed, take it as directed. Make sure you finish it even if you start to feel better.  Do not have sex until treatment is completed.  Tell all sexual partners that you have a vaginal infection. They should see their health care provider and be treated if they have problems, such as a mild rash or itching.  Practice safe sex by using condoms and only having one sex partner. SEEK MEDICAL CARE IF:   Your symptoms are not improving after 3 days of treatment.  You have increased discharge or pain.  You have a fever. MAKE SURE YOU:   Understand these instructions.  Will watch your condition.  Will get help right away if you are not doing well or get worse. FOR MORE INFORMATION  Centers for Disease Control and Prevention, Division of STD Prevention: www.cdc.gov/std American Sexual Health Association (ASHA): www.ashastd.org  Document Released: 07/01/2005 Document Revised: 04/21/2013 Document Reviewed: 02/10/2013 ExitCare Patient Information 2015 [employer]. This information is not intended to replace advice given to you by your health care provider. Make sure you discuss any questions you have with your health care provider.  

## 2014-09-13 LAB — WET PREP, GENITAL
Trich, Wet Prep: NONE SEEN
Yeast Wet Prep HPF POC: NONE SEEN

## 2014-09-20 ENCOUNTER — Other Ambulatory Visit: Payer: 59

## 2014-09-21 ENCOUNTER — Other Ambulatory Visit: Payer: 59

## 2014-09-22 LAB — CBC
HCT: 31.7 % — ABNORMAL LOW (ref 36.0–46.0)
Hemoglobin: 9.6 g/dL — ABNORMAL LOW (ref 12.0–15.0)
MCH: 18.1 pg — ABNORMAL LOW (ref 26.0–34.0)
MCHC: 30.3 g/dL (ref 30.0–36.0)
MCV: 59.9 fL — ABNORMAL LOW (ref 78.0–100.0)
Platelets: 332 10*3/uL (ref 150–400)
RBC: 5.29 MIL/uL — ABNORMAL HIGH (ref 3.87–5.11)
RDW: 18.7 % — ABNORMAL HIGH (ref 11.5–15.5)
WBC: 4 10*3/uL (ref 4.0–10.5)

## 2014-10-26 ENCOUNTER — Other Ambulatory Visit (HOSPITAL_COMMUNITY): Payer: Self-pay | Admitting: Diagnostic Radiology

## 2014-10-26 DIAGNOSIS — D259 Leiomyoma of uterus, unspecified: Secondary | ICD-10-CM

## 2014-11-29 ENCOUNTER — Ambulatory Visit
Admission: RE | Admit: 2014-11-29 | Discharge: 2014-11-29 | Disposition: A | Discharge status: Home / Self Care | Payer: 59 | Source: Ambulatory Visit | Attending: Diagnostic Radiology | Admitting: Diagnostic Radiology

## 2014-11-29 ENCOUNTER — Ambulatory Visit (HOSPITAL_COMMUNITY): Payer: 59

## 2014-11-29 DIAGNOSIS — D259 Leiomyoma of uterus, unspecified: Secondary | ICD-10-CM | POA: Insufficient documentation

## 2014-11-29 NOTE — Consult Note (Signed)
Chief Complaint: Follow-up uterine artery embolization.  Referring Physician(s): [name]  History of Present Illness: Carol Mack is a 44 y.o. female with history of uterine fibroids and menorrhagia. Patient underwent uterine artery embolization procedure on 06/02/2014. She had a lot of discomfort and pain immediately following the embolization procedure which slowly improved. She has a history of vaginal discharge which has continued following the embolization procedure and was recently evaluated by gynecology and taking Metrogel.  Patient is very happy with the results of the uterine artery embolization procedure. She no longer has abdominal or pelvic cramping associated with her menstrual periods. The menstrual bleeding is much lighter following the procedure. Her menstrual periods last approximately 5 days with only 3 days of mild to moderate bleeding. The patient's only complaint is persistent fullness along her lower anterior abdomen and pelvis. She says that this has decreased in size but has not completely resolved. She has occasional headaches with shooting pain along the left side of her head which are self-limiting.  Past Medical History  Diagnosis Date  . Bartholin cyst   . Hernia, inguinal   . Hernia, inguinal     Past Surgical History  Procedure Laterality Date  . No past surgeries      Allergies: Review of patient's allergies indicates no known allergies.  Medications: Prior to Admission medications   Medication Sig Start Date End Date Taking? Authorizing Provider  albuterol (PROVENTIL HFA;VENTOLIN HFA) 108 (90 BASE) MCG/ACT inhaler Inhale 2 puffs into the lungs every 6 (six) hours as needed for shortness of breath.   Yes Historical Provider, MD  ibuprofen (ADVIL,MOTRIN) 200 MG tablet Take 800 mg by mouth every 6 (six) hours as needed for moderate pain.   Yes Historical Provider, MD  metroNIDAZOLE (METROGEL) 0.75 % vaginal gel Place 1 Applicatorful  vaginally at bedtime. Apply one applicatorful to vagina at bedtime for 5 days 09/12/14  Yes Woodroe Mode, MD  ketorolac (TORADOL) 10 MG tablet Take 1 tablet (10 mg total) by mouth every 6 (six) hours as needed. Patient not taking: Reported on 11/29/2014 06/14/14   Tresea Mall, CNM  ondansetron (ZOFRAN ODT) 4 MG disintegrating tablet Take 1 tablet (4 mg total) by mouth every 8 (eight) hours as needed for nausea or vomiting. Patient not taking: Reported on 11/29/2014 03/04/14   Carlisle Cater, PA-C  oxyCODONE-acetaminophen (PERCOCET/ROXICET) 5-325 MG per tablet Take 1-2 tablets by mouth every 4 (four) hours as needed for moderate pain or severe pain. Patient not taking: Reported on 11/29/2014 06/14/14   Tresea Mall, CNM  tinidazole (TINDAMAX) 500 MG tablet Take 4 tablets (2,000 mg total) by mouth daily with breakfast. For 2 days Patient not taking: Reported on 11/29/2014 07/22/14   Truett Mainland, DO     No family history on file.  History   Social History  . Marital Status: Single    Spouse Name: N/A  . Number of Children: N/A  . Years of Education: N/A   Social History Main Topics  . Smoking status: Current Every Day Smoker -- 1.00 packs/day    Types: Cigarettes  . Smokeless tobacco: Not on file  . Alcohol Use: No  . Drug Use: No  . Sexual Activity: Yes    Birth Control/ Protection: None   Other Topics Concern  . Not on file   Social History Narrative     Review of Systems  Constitutional: Negative.   Respiratory: Negative.   Cardiovascular: Negative.   Gastrointestinal: Positive  for abdominal distention.  Genitourinary: Positive for vaginal discharge.  Neurological: Positive for headaches.    Vital Signs: BP 142/84 mmHg  Pulse 63  Temp(Src) 97.8 F (36.6 C)  Resp 18  Ht 5\' 4"  (1.626 m)  Wt 144 lb 12.8 oz (65.681 kg)  BMI 24.84 kg/m2  SpO2 100%  Physical Exam  Constitutional: She appears well-developed and well-nourished.  Cardiovascular: Normal rate,  regular rhythm and normal heart sounds.   Pulmonary/Chest: Effort normal and breath sounds normal.  Abdominal: Soft. Bowel sounds are normal.  Focal fullness in lower abdominal midline consistent with an enlarged uterus.          Imaging: No results found.  Labs:  CBC:  Recent Labs  06/02/14 0805 06/13/14 2315 09/21/14 1428  WBC 5.6 12.4* 4.0  HGB 8.6* 9.5* 9.6*  HCT 25.9* 27.8* 31.7*  PLT 297 647* 332    COAGS:  Recent Labs  06/02/14 0805  INR 0.99    BMP:  Recent Labs  06/02/14 0805  NA 139  K 4.2  CL 106  CO2 20  GLUCOSE 97  BUN 16  CALCIUM 9.0  CREATININE 0.70  GFRNONAA >90  GFRAA >90     Assessment and Plan:  Post uterine artery embolization for uterine fibroids and menorrhagia. The patient is very happy with the treatment results. The menstrual bleeding has markedly decreased and the patient's abdominal cramping has resolved. Patient continues to have focal fullness in the lower abdominal midline related to her enlarged uterus. Unfortunately, the patient did not have a follow-up MRI due to insurance issues. It would be helpful to get a follow-up MRI to assess the treatment changes in the uterus based on the large size of the uterus, the numerous fibroids and her persistent bulk symptoms.  Patient will follow-up as needed. She continues to follow-up with gynecology for her routine examination and her vaginal discharge.  I advised the patient to see her primary care physician if her headaches continue.    SignedCarylon Perches 11/29/2014, 1:27 PM   I spent a total of   10 Minutes in face to face in clinical consultation, greater than 50% of which was counseling/coordinating care for uterine fibroids.

## 2014-11-29 NOTE — Progress Notes (Signed)
Patient here for 31-month follow-up [place].  Denies any complications or ongoing symptoms since [place], except that she's disappointed her belly hasn't become any flatter.  "I still have this pooch."  Other than that, she happily states she no longer has any cramping with her menstrual cycles.  jkl

## 2015-02-12 ENCOUNTER — Emergency Department (HOSPITAL_COMMUNITY)
Admission: EM | Admit: 2015-02-12 | Discharge: 2015-02-12 | Disposition: A | Discharge status: Home / Self Care | Payer: 59 | Attending: Emergency Medicine | Admitting: Emergency Medicine

## 2015-02-12 ENCOUNTER — Encounter (HOSPITAL_COMMUNITY): Payer: Self-pay | Admitting: Emergency Medicine

## 2015-02-12 DIAGNOSIS — Z8742 Personal history of other diseases of the female genital tract: Secondary | ICD-10-CM | POA: Insufficient documentation

## 2015-02-12 DIAGNOSIS — Z8719 Personal history of other diseases of the digestive system: Secondary | ICD-10-CM | POA: Insufficient documentation

## 2015-02-12 DIAGNOSIS — J0101 Acute recurrent maxillary sinusitis: Secondary | ICD-10-CM | POA: Diagnosis not present

## 2015-02-12 DIAGNOSIS — Z720 Tobacco use: Secondary | ICD-10-CM | POA: Diagnosis not present

## 2015-02-12 DIAGNOSIS — Z79899 Other long term (current) drug therapy: Secondary | ICD-10-CM | POA: Diagnosis not present

## 2015-02-12 DIAGNOSIS — R51 Headache: Secondary | ICD-10-CM | POA: Diagnosis present

## 2015-02-12 DIAGNOSIS — R519 Headache, unspecified: Secondary | ICD-10-CM

## 2015-02-12 MED ORDER — CETIRIZINE-PSEUDOEPHEDRINE ER 5-120 MG PO TB12: 1.0000 | ORAL_TABLET | Freq: Two times a day (BID) | ORAL | Status: DC

## 2015-02-12 MED ORDER — FLUTICASONE PROPIONATE 50 MCG/ACT NA SUSP: 2.0000 | Freq: Every day | NASAL | Status: DC

## 2015-02-12 MED ORDER — KETOROLAC TROMETHAMINE 60 MG/2ML IM SOLN
60.0000 mg | Freq: Once | INTRAMUSCULAR | Status: AC
  Administered 2015-02-12: 60 mg via INTRAMUSCULAR
  Filled 2015-02-12: qty 2

## 2015-02-12 NOTE — Discharge Instructions (Signed)
Take zyrtec d as prescribed daily, this has decongestant in it which will help you with sinus pressure. flonase daily. Use saline nasal spray every 2 hrs. Over the counter ibuprofen/advil/ excedrin for headache every 4-6 hrs. Follow up with primary care doctor.   Sinusitis Sinusitis is redness, soreness, and inflammation of the paranasal sinuses. Paranasal sinuses are air pockets within the bones of your face (beneath the eyes, the middle of the forehead, or above the eyes). In healthy paranasal sinuses, mucus is able to drain out, and air is able to circulate through them by way of your nose. However, when your paranasal sinuses are inflamed, mucus and air can become trapped. This can allow bacteria and other germs to grow and cause infection. Sinusitis can develop quickly and last only a short time (acute) or continue over a long period (chronic). Sinusitis that lasts for more than 12 weeks is considered chronic.  CAUSES  Causes of sinusitis include:  Allergies.  Structural abnormalities, such as displacement of the cartilage that separates your nostrils (deviated septum), which can decrease the air flow through your nose and sinuses and affect sinus drainage.  Functional abnormalities, such as when the small hairs (cilia) that line your sinuses and help remove mucus do not work properly or are not present. SIGNS AND SYMPTOMS  Symptoms of acute and chronic sinusitis are the same. The primary symptoms are pain and pressure around the affected sinuses. Other symptoms include:  Upper toothache.  Earache.  Headache.  Bad breath.  Decreased sense of smell and taste.  A cough, which worsens when you are lying flat.  Fatigue.  Fever.  Thick drainage from your nose, which often is green and may contain pus (purulent).  Swelling and warmth over the affected sinuses. DIAGNOSIS  Your health care provider will perform a physical exam. During the exam, your health care provider may:  Look  in your nose for signs of abnormal growths in your nostrils (nasal polyps).  Tap over the affected sinus to check for signs of infection.  View the inside of your sinuses (endoscopy) using an imaging device that has a light attached (endoscope). If your health care provider suspects that you have chronic sinusitis, one or more of the following tests may be recommended:  Allergy tests.  Nasal culture. A sample of mucus is taken from your nose, sent to a lab, and screened for bacteria.  Nasal cytology. A sample of mucus is taken from your nose and examined by your health care provider to determine if your sinusitis is related to an allergy. TREATMENT  Most cases of acute sinusitis are related to a viral infection and will resolve on their own within 10 days. Sometimes medicines are prescribed to help relieve symptoms (pain medicine, decongestants, nasal steroid sprays, or saline sprays).  However, for sinusitis related to a bacterial infection, your health care provider will prescribe antibiotic medicines. These are medicines that will help kill the bacteria causing the infection.  Rarely, sinusitis is caused by a fungal infection. In theses cases, your health care provider will prescribe antifungal medicine. For some cases of chronic sinusitis, surgery is needed. Generally, these are cases in which sinusitis recurs more than 3 times per year, despite other treatments. HOME CARE INSTRUCTIONS   Drink plenty of water. Water helps thin the mucus so your sinuses can drain more easily.  Use a humidifier.  Inhale steam 3 to 4 times a day (for example, sit in the bathroom with the shower running).  Apply  a warm, moist washcloth to your face 3 to 4 times a day, or as directed by your health care provider.  Use saline nasal sprays to help moisten and clean your sinuses.  Take medicines only as directed by your health care provider.  If you were prescribed either an antibiotic or antifungal  medicine, finish it all even if you start to feel better. SEEK IMMEDIATE MEDICAL CARE IF:  You have increasing pain or severe headaches.  You have nausea, vomiting, or drowsiness.  You have swelling around your face.  You have vision problems.  You have a stiff neck.  You have difficulty breathing. MAKE SURE YOU:   Understand these instructions.  Will watch your condition.  Will get help right away if you are not doing well or get worse. Document Released: 07/01/2005 Document Revised: 11/15/2013 Document Reviewed: 07/16/2011 [facility] Patient Information 2015 [place], [place]. This information is not intended to replace advice given to you by your health care provider. Make sure you discuss any questions you have with your health care provider.

## 2015-02-12 NOTE — ED Notes (Signed)
Pt c/o "sinus pressure, in the pockets," "I can tell because I've had it before." Pt denies cough.

## 2015-02-12 NOTE — ED Provider Notes (Signed)
CSN: [id number]     Arrival date & time 02/12/15  1202 History  This chart was scribed for non-physician practitioner, Renold Genta, PA-C, working with Leonard Schwartz, MD, by Stephania Fragmin, ED Scribe. This patient was seen in room WTR8/WTR8 and the patient's care was started at 12:40 PM.   Chief Complaint  Patient presents with  . Facial Pain   The history is provided by the patient. No language interpreter was used.    HPI Comments: Carol Mack is a 44 y.o. female who presents to the Emergency Department complaining of constant, sore sinus pressure and a headache that began yesterday. She reports a history of recurrent sinus infections and states this feels like one. She also complains of associated sneezing and rhinorrhea, and notes mild otalgia yesterday that resolved today. Light exacerbates her headache, but she denies a history of migraines. Patient had also taken Benadryl and Advil yesterday with some relief. She states she hasn't taken anything today. She denies fever, sore throat, or cough.  Past Medical History  Diagnosis Date  . Bartholin cyst   . Hernia, inguinal   . Hernia, inguinal    Past Surgical History  Procedure Laterality Date  . No past surgeries     History reviewed. No pertinent family history. History  Substance Use Topics  . Smoking status: Current Every Day Smoker -- 1.00 packs/day    Types: Cigarettes  . Smokeless tobacco: Not on file  . Alcohol Use: No   OB History    Gravida Para Term Preterm AB TAB SAB Ectopic Multiple Living   2 2 2       2      Review of Systems  Constitutional: Negative for fever.  HENT: Positive for ear pain (resolved), rhinorrhea, sinus pressure and sneezing. Negative for sore throat.   Respiratory: Negative for cough.   Neurological: Positive for headaches.   Allergies  Review of patient's allergies indicates no known allergies.  Home Medications   Prior to Admission medications   Medication Sig Start Date End  Date Taking? Authorizing Provider  diphenhydrAMINE (SOMINEX) 25 MG tablet Take 50 mg by mouth once.   Yes Historical Provider, MD  ibuprofen (ADVIL) 200 MG tablet Take 400 mg by mouth every 6 (six) hours as needed for moderate pain.   Yes Historical Provider, MD  albuterol (PROVENTIL HFA;VENTOLIN HFA) 108 (90 BASE) MCG/ACT inhaler Inhale 2 puffs into the lungs every 6 (six) hours as needed for shortness of breath.    Historical Provider, MD  ketorolac (TORADOL) 10 MG tablet Take 1 tablet (10 mg total) by mouth every 6 (six) hours as needed. Patient not taking: Reported on 11/29/2014 06/14/14   Tresea Mall, CNM  metroNIDAZOLE (METROGEL) 0.75 % vaginal gel Place 1 Applicatorful vaginally at bedtime. Apply one applicatorful to vagina at bedtime for 5 days Patient not taking: Reported on 02/12/2015 09/12/14   Woodroe Mode, MD  ondansetron (ZOFRAN ODT) 4 MG disintegrating tablet Take 1 tablet (4 mg total) by mouth every 8 (eight) hours as needed for nausea or vomiting. Patient not taking: Reported on 11/29/2014 03/04/14   Carlisle Cater, PA-C  oxyCODONE-acetaminophen (PERCOCET/ROXICET) 5-325 MG per tablet Take 1-2 tablets by mouth every 4 (four) hours as needed for moderate pain or severe pain. Patient not taking: Reported on 11/29/2014 06/14/14   Tresea Mall, CNM  tinidazole (TINDAMAX) 500 MG tablet Take 4 tablets (2,000 mg total) by mouth daily with breakfast. For 2 days Patient not taking: Reported on  11/29/2014 07/22/14   Tanna Savoy Stinson, DO   BP 148/94 mmHg  Pulse 77  Temp(Src) 98.4 F (36.9 C) (Oral)  Resp 16  SpO2 99% Physical Exam  Constitutional: She is oriented to person, place, and time. She appears well-developed and well-nourished. No distress.  HENT:  Head: Normocephalic and atraumatic.  Right Ear: Tympanic membrane, external ear and ear canal normal.  Left Ear: Tympanic membrane, external ear and ear canal normal.  Nose: Mucosal edema and rhinorrhea present. Right sinus exhibits  maxillary sinus tenderness and frontal sinus tenderness. Left sinus exhibits maxillary sinus tenderness and frontal sinus tenderness.  Mouth/Throat: Uvula is midline, oropharynx is clear and moist and mucous membranes are normal.  Eyes: Conjunctivae and EOM are normal.  Neck: Normal range of motion. Neck supple. No tracheal deviation present.  Cardiovascular: Normal rate, regular rhythm and normal heart sounds.   Pulmonary/Chest: Effort normal and breath sounds normal. No respiratory distress. She has no wheezes. She has no rales.  Musculoskeletal: Normal range of motion.  Neurological: She is alert and oriented to person, place, and time.  Skin: Skin is warm and dry.  Psychiatric: She has a normal mood and affect. Her behavior is normal.  Nursing note and vitals reviewed.   ED Course  Procedures (including critical care time)  DIAGNOSTIC STUDIES: Oxygen Saturation is 99% on RA, normal by my interpretation.    COORDINATION OF CARE: 12:44 PM - Discussed treatment plan with pt at bedside which includes Flonase, saline, and Zyrtec/Sudafed. Will also give injection for patient's headache, per pt's request. I told pt that this will only provide temporary relief for her symptoms. Also advised Tylenol and Advil prn for her headache. Will give work note for today. Pt verbalized understanding and agreed to plan.  MDM   Final diagnoses:  Recurrent maxillary sinusitis, unspecified chronicity  Nonintractable headache, unspecified chronicity pattern, unspecified headache type   Patient emergency department with headache and sinus pressure in the started yesterday. Patient does have nasal congestion. She is afebrile, nontoxic appearing. No sore throat or cough. Most likely viral sinusitis versus allergies. Patient also reports watery and itchy eyes and sneezing. Will treat with Flonase, Zyrtec with pseudoephedrine, saline nasal rinses, ibuprofen. She received Toradol shot in the emergency department.  Instructed to follow with her primary care doctor if not improving. Return precautions discussed.  Filed Vitals:   02/12/15 1207 02/12/15 1258  BP: 148/94 153/104  Pulse: 77 69  Temp: 98.4 F (36.9 C) 98.4 F (36.9 C)  TempSrc: Oral   Resp: 16 18  SpO2: 99% 100%    I personally performed the services described in this documentation, which was scribed in my presence. The recorded information has been reviewed and is accurate.   Jeannett Senior, PA-C 02/12/15 [place], DO 02/12/15 2321

## 2015-05-01 ENCOUNTER — Other Ambulatory Visit: Payer: Self-pay | Admitting: *Deleted

## 2015-05-01 DIAGNOSIS — N760 Acute vaginitis: Principal | ICD-10-CM

## 2015-05-01 DIAGNOSIS — B9689 Other specified bacterial agents as the cause of diseases classified elsewhere: Secondary | ICD-10-CM

## 2015-05-01 MED ORDER — METRONIDAZOLE 0.75 % VA GEL: 1.0000 | Freq: Every day | VAGINAL | Status: DC

## 2015-06-13 ENCOUNTER — Encounter (HOSPITAL_COMMUNITY): Payer: Self-pay | Admitting: Emergency Medicine

## 2015-06-13 DIAGNOSIS — G43909 Migraine, unspecified, not intractable, without status migrainosus: Secondary | ICD-10-CM | POA: Diagnosis not present

## 2015-06-13 DIAGNOSIS — Z79899 Other long term (current) drug therapy: Secondary | ICD-10-CM | POA: Insufficient documentation

## 2015-06-13 DIAGNOSIS — F17210 Nicotine dependence, cigarettes, uncomplicated: Secondary | ICD-10-CM | POA: Insufficient documentation

## 2015-06-13 DIAGNOSIS — Z7951 Long term (current) use of inhaled steroids: Secondary | ICD-10-CM | POA: Insufficient documentation

## 2015-06-13 DIAGNOSIS — Z792 Long term (current) use of antibiotics: Secondary | ICD-10-CM | POA: Insufficient documentation

## 2015-06-13 DIAGNOSIS — Z8719 Personal history of other diseases of the digestive system: Secondary | ICD-10-CM | POA: Diagnosis not present

## 2015-06-13 DIAGNOSIS — Z8742 Personal history of other diseases of the female genital tract: Secondary | ICD-10-CM | POA: Diagnosis not present

## 2015-06-13 DIAGNOSIS — R1110 Vomiting, unspecified: Secondary | ICD-10-CM | POA: Diagnosis present

## 2015-06-13 LAB — COMPREHENSIVE METABOLIC PANEL
ALT: 10 U/L — ABNORMAL LOW (ref 14–54)
AST: 15 U/L (ref 15–41)
Albumin: 3.7 g/dL (ref 3.5–5.0)
Alkaline Phosphatase: 53 U/L (ref 38–126)
Anion gap: 10 (ref 5–15)
BUN: 8 mg/dL (ref 6–20)
CO2: 22 mmol/L (ref 22–32)
Calcium: 9.3 mg/dL (ref 8.9–10.3)
Chloride: 108 mmol/L (ref 101–111)
Creatinine, Ser: 0.67 mg/dL (ref 0.44–1.00)
GFR calc Af Amer: >60 mL/min (ref >60)
GFR calc non Af Amer: >60 mL/min (ref >60)
Glucose, Bld: 106 mg/dL — ABNORMAL HIGH (ref 65–99)
Potassium: 3.8 mmol/L (ref 3.5–5.1)
Sodium: 140 mmol/L (ref 135–145)
Total Bilirubin: 0.4 mg/dL (ref 0.3–1.2)
Total Protein: 7.4 g/dL (ref 6.5–8.1)

## 2015-06-13 LAB — DIFFERENTIAL
Basophils Absolute: 0.1 10*3/uL (ref 0.0–0.1)
Basophils Relative: 1 %
Eosinophils Absolute: 0.1 10*3/uL (ref 0.0–0.7)
Eosinophils Relative: 1 %
Lymphocytes Relative: 50 %
Lymphs Abs: 3.6 10*3/uL (ref 0.7–4.0)
Monocytes Absolute: 0.4 10*3/uL (ref 0.1–1.0)
Monocytes Relative: 5 %
Neutro Abs: 3.2 10*3/uL (ref 1.7–7.7)
Neutrophils Relative %: 43 %

## 2015-06-13 LAB — URINALYSIS, ROUTINE W REFLEX MICROSCOPIC
Bilirubin Urine: NEGATIVE
Glucose, UA: NEGATIVE mg/dL
Ketones, ur: 15 mg/dL — AB
Nitrite: NEGATIVE
Protein, ur: NEGATIVE mg/dL
Specific Gravity, Urine: 1.026 (ref 1.005–1.030)
pH: 5 (ref 5.0–8.0)

## 2015-06-13 LAB — LIPASE, BLOOD: Lipase: 27 U/L (ref 11–51)

## 2015-06-13 LAB — CBC
HCT: 33 % — ABNORMAL LOW (ref 36.0–46.0)
Hemoglobin: 11.7 g/dL — ABNORMAL LOW (ref 12.0–15.0)
MCH: 20.9 pg — ABNORMAL LOW (ref 26.0–34.0)
MCHC: 35.5 g/dL (ref 30.0–36.0)
MCV: 58.8 fL — ABNORMAL LOW (ref 78.0–100.0)
Platelets: 270 10*3/uL (ref 150–400)
RBC: 5.61 MIL/uL — ABNORMAL HIGH (ref 3.87–5.11)
RDW: 18.5 % — ABNORMAL HIGH (ref 11.5–15.5)
WBC: 7.4 10*3/uL (ref 4.0–10.5)

## 2015-06-13 LAB — URINE MICROSCOPIC-ADD ON

## 2015-06-13 LAB — POC URINE PREG, ED: Preg Test, Ur: NEGATIVE

## 2015-06-13 NOTE — ED Notes (Signed)
Pt. reports emesis and headache onset this morning , denies diarrhea / no fever or chills.

## 2015-06-14 ENCOUNTER — Emergency Department (HOSPITAL_COMMUNITY)
Admission: EM | Admit: 2015-06-14 | Discharge: 2015-06-14 | Disposition: A | Discharge status: Home / Self Care | Payer: 59 | Attending: Emergency Medicine | Admitting: Emergency Medicine

## 2015-06-14 ENCOUNTER — Telehealth: Payer: Self-pay | Admitting: *Deleted

## 2015-06-14 DIAGNOSIS — G43009 Migraine without aura, not intractable, without status migrainosus: Secondary | ICD-10-CM

## 2015-06-14 MED ORDER — KETOROLAC TROMETHAMINE 30 MG/ML IJ SOLN
30.0000 mg | Freq: Once | INTRAMUSCULAR | Status: AC
  Administered 2015-06-14: 30 mg via INTRAVENOUS
  Filled 2015-06-14: qty 1

## 2015-06-14 MED ORDER — BUTALBITAL-APAP-CAFFEINE 50-325-40 MG PO TABS: 1.0000 | ORAL_TABLET | Freq: Four times a day (QID) | ORAL | Status: AC | PRN

## 2015-06-14 MED ORDER — DEXAMETHASONE SODIUM PHOSPHATE 10 MG/ML IJ SOLN
10.0000 mg | Freq: Once | INTRAMUSCULAR | Status: AC
  Administered 2015-06-14: 10 mg via INTRAVENOUS
  Filled 2015-06-14: qty 1

## 2015-06-14 MED ORDER — METOCLOPRAMIDE HCL 5 MG/ML IJ SOLN
10.0000 mg | Freq: Once | INTRAMUSCULAR | Status: AC
  Administered 2015-06-14: 10 mg via INTRAVENOUS
  Filled 2015-06-14: qty 2

## 2015-06-14 MED ORDER — DIPHENHYDRAMINE HCL 50 MG/ML IJ SOLN
50.0000 mg | Freq: Once | INTRAMUSCULAR | Status: AC
  Administered 2015-06-14: 50 mg via INTRAVENOUS
  Filled 2015-06-14: qty 1

## 2015-06-14 MED ORDER — ONDANSETRON 4 MG PO TBDP
4.0000 mg | ORAL_TABLET | Freq: Three times a day (TID) | ORAL | Status: DC | PRN
Start: 2015-06-14 — End: 2017-08-07

## 2015-06-14 NOTE — Telephone Encounter (Signed)
Pharmacy called related to Rx: ondansetron (ZOFRAN ODT) 4 MG disintegrating tablet not covered by pt insurance.Marland KitchenMarland KitchenNCM clarified with EDP to change Rx to: tablet.

## 2015-06-14 NOTE — Discharge Instructions (Signed)

## 2015-06-14 NOTE — ED Provider Notes (Signed)
By signing my name below, I, Forrestine Him, attest that this documentation has been prepared under the direction and in the presence of [place], DO.  Electronically Signed: Forrestine Him, ED Scribe. 06/14/2015. 12:40 AM.   TIME SEEN: 12:37 AM   CHIEF COMPLAINT:  Chief Complaint  Patient presents with  . Emesis  . Headache     HPI:  HPI Comments: Carol Mack is a 44 y.o. female with a PMHx of Migraines who presents to the Emergency Department complaining of constant, ongoing HA onset 10:00 AM this morning; worsened throughout the day. Gradual onset. Pain is described as throbbing. No recent head injury. Ongoing photophobia, phonophobia, and vomiting also reported. No aggravating or alleviating factors at this time. No OTC medications or home remedies attempted prior to arrival. No recent fever, chills, chest pain, or shortness of breath. No numbness, loss of sensation, or weakness. Pt is not currently on any anticoagulants. No recent head injury. Feels similar to her previous migraines but is more severe in nature. Has had to come to the hospital before for treatment. It appears per prior records pain has resolved with migraine cocktails.  PCP: No PCP Per Patient    ROS: See HPI Constitutional: no fever  Eyes: no drainage  ENT: no runny nose   Cardiovascular:  no chest pain  Resp: no SOB  GI: Positive vomiting GU: no dysuria Integumentary: no rash  Allergy: no hives  Musculoskeletal: no leg swelling  Neurological: no slurred speech. Positive HA ROS otherwise negative  PAST MEDICAL HISTORY/PAST SURGICAL HISTORY:  Past Medical History  Diagnosis Date  . Bartholin cyst   . Hernia, inguinal   . Hernia, inguinal     MEDICATIONS:  Prior to Admission medications   Medication Sig Start Date End Date Taking? Authorizing Provider  albuterol (PROVENTIL HFA;VENTOLIN HFA) 108 (90 BASE) MCG/ACT inhaler Inhale 2 puffs into the lungs every 6 (six) hours as needed for shortness of  breath.    Historical Provider, MD  cetirizine-pseudoephedrine (ZYRTEC-D) 5-120 MG per tablet Take 1 tablet by mouth 2 (two) times daily. 02/12/15   Tatyana Kirichenko, PA-C  diphenhydrAMINE (SOMINEX) 25 MG tablet Take 50 mg by mouth once.    Historical Provider, MD  fluticasone (FLONASE) 50 MCG/ACT nasal spray Place 2 sprays into both nostrils daily. 02/12/15   Tatyana Kirichenko, PA-C  ibuprofen (ADVIL) 200 MG tablet Take 400 mg by mouth every 6 (six) hours as needed for moderate pain.    Historical Provider, MD  ketorolac (TORADOL) 10 MG tablet Take 1 tablet (10 mg total) by mouth every 6 (six) hours as needed. Patient not taking: Reported on 11/29/2014 06/14/14   Tresea Mall, CNM  metroNIDAZOLE (METROGEL) 0.75 % vaginal gel Place 1 Applicatorful vaginally at bedtime. Apply one applicatorful to vagina at bedtime for 5 days 05/01/15   Woodroe Mode, MD  ondansetron (ZOFRAN ODT) 4 MG disintegrating tablet Take 1 tablet (4 mg total) by mouth every 8 (eight) hours as needed for nausea or vomiting. Patient not taking: Reported on 11/29/2014 03/04/14   Carlisle Cater, PA-C  oxyCODONE-acetaminophen (PERCOCET/ROXICET) 5-325 MG per tablet Take 1-2 tablets by mouth every 4 (four) hours as needed for moderate pain or severe pain. Patient not taking: Reported on 11/29/2014 06/14/14   Tresea Mall, CNM  tinidazole (TINDAMAX) 500 MG tablet Take 4 tablets (2,000 mg total) by mouth daily with breakfast. For 2 days Patient not taking: Reported on 11/29/2014 07/22/14   Truett Mainland, DO  ALLERGIES:  No Known Allergies  SOCIAL HISTORY:  Social History  Substance Use Topics  . Smoking status: Current Every Day Smoker -- 0.00 packs/day    Types: Cigarettes  . Smokeless tobacco: Not on file  . Alcohol Use: No    FAMILY HISTORY: No family history on file.  EXAM: BP 149/108 mmHg  Pulse 68  Temp(Src) 98.4 F (36.9 C) (Oral)  Resp 18  Ht 5\' 4"  (1.626 m)  Wt 140 lb (63.504 kg)  BMI 24.02 kg/m2  SpO2  98%  LMP 06/11/2015 CONSTITUTIONAL: Alert and oriented and responds appropriately to questions. Well-appearing; well-nourished HEAD: Normocephalic EYES: Conjunctivae clear, PERRL. She has photophobia. ENT: normal nose; no rhinorrhea; moist mucous membranes; pharynx without lesions noted NECK: Supple, no meningismus, no LAD  CARD: RRR; S1 and S2 appreciated; no murmurs, no clicks, no rubs, no gallops RESP: Normal chest excursion without splinting or tachypnea; breath sounds clear and equal bilaterally; no wheezes, no rhonchi, no rales, no hypoxia or respiratory distress, speaking full sentences ABD/GI: Normal bowel sounds; non-distended; soft, non-tender, no rebound, no guarding, no peritoneal signs BACK:  The back appears normal and is non-tender to palpation, there is no CVA tenderness EXT: Normal ROM in all joints; non-tender to palpation; no edema; normal capillary refill; no cyanosis, no calf tenderness or swelling    SKIN: Normal color for age and race; warm NEURO: Moves all extremities equally, sensation to light touch intact diffusely, cranial nerves II through XII intact PSYCH: The patient's mood and manner are appropriate. Grooming and personal hygiene are appropriate.  MEDICAL DECISION MAKING: Patient here with migraine headache. Gradual onset. No thunderclap, worst headache of her life. Neurologically intact. On anticoagulation and no history of head injury. Has had similar headaches in the past. Headaches in the past have resolved with Toradol, Reglan, Benadryl. Will give the same along with IV fluids and Decadron. Labs ordered in triage are unremarkable. She does appear to have hemoglobin and leukocytes in her urine but has no urinary symptoms. She is currently on her menstrual cycle. She is not pregnant.  ED PROGRESS: 2:15 AM  Pt's has completely resolved. Her blood pressure has also improved as her pain is improved. Suspect migraine headache. Will discharge home. Return precautions.  She verbalized understanding and is comfortable with this plan. We'll discharge with Fioricet and Zofran as needed to take at home. Given outpatient follow-up information.      I personally performed the services described in this documentation, which was scribed in my presence. The recorded information has been reviewed and is accurate.   [place], DO 06/14/15 [id number]

## 2015-08-11 ENCOUNTER — Encounter (HOSPITAL_COMMUNITY): Payer: Self-pay | Admitting: Emergency Medicine

## 2015-08-11 ENCOUNTER — Emergency Department (HOSPITAL_COMMUNITY)
Admission: EM | Admit: 2015-08-11 | Discharge: 2015-08-11 | Disposition: A | Discharge status: Home / Self Care | Payer: 59 | Attending: Emergency Medicine | Admitting: Emergency Medicine

## 2015-08-11 DIAGNOSIS — Z8742 Personal history of other diseases of the female genital tract: Secondary | ICD-10-CM | POA: Insufficient documentation

## 2015-08-11 DIAGNOSIS — R221 Localized swelling, mass and lump, neck: Secondary | ICD-10-CM | POA: Diagnosis present

## 2015-08-11 DIAGNOSIS — Z79899 Other long term (current) drug therapy: Secondary | ICD-10-CM | POA: Diagnosis not present

## 2015-08-11 DIAGNOSIS — Z8719 Personal history of other diseases of the digestive system: Secondary | ICD-10-CM | POA: Insufficient documentation

## 2015-08-11 DIAGNOSIS — E010 Iodine-deficiency related diffuse (endemic) goiter: Secondary | ICD-10-CM | POA: Diagnosis not present

## 2015-08-11 DIAGNOSIS — J45909 Unspecified asthma, uncomplicated: Secondary | ICD-10-CM | POA: Insufficient documentation

## 2015-08-11 DIAGNOSIS — F17210 Nicotine dependence, cigarettes, uncomplicated: Secondary | ICD-10-CM | POA: Insufficient documentation

## 2015-08-11 HISTORY — DX: Unspecified asthma, uncomplicated: J45.909

## 2015-08-11 LAB — I-STAT CHEM 8, ED
BUN: 6 mg/dL (ref 6–20)
Calcium, Ion: 1.21 mmol/L (ref 1.12–1.23)
Chloride: 103 mmol/L (ref 101–111)
Creatinine, Ser: 0.6 mg/dL (ref 0.44–1.00)
Glucose, Bld: 98 mg/dL (ref 65–99)
HCT: 41 % (ref 36.0–46.0)
Hemoglobin: 13.9 g/dL (ref 12.0–15.0)
Potassium: 4.1 mmol/L (ref 3.5–5.1)
Sodium: 139 mmol/L (ref 135–145)
TCO2: 26 mmol/L (ref 0–100)

## 2015-08-11 LAB — TSH: TSH: 0.339 u[IU]/mL — ABNORMAL LOW (ref 0.350–4.500)

## 2015-08-11 MED ORDER — ALBUTEROL SULFATE HFA 108 (90 BASE) MCG/ACT IN AERS
2.0000 | INHALATION_SPRAY | Freq: Once | RESPIRATORY_TRACT | Status: AC
  Administered 2015-08-11: 2 via RESPIRATORY_TRACT
  Filled 2015-08-11: qty 6.7

## 2015-08-11 NOTE — Progress Notes (Signed)
EDCM consulted to assist in finding a pcp.  Patient listed as having [employer] without a pcp.  EDCM spoke to patient at bedside and provided list of pcps who accept patient's insurance.  Day shift EDCM left voice message at Dr. Lynder Parents office.  Day shift EDCM obtained appointment  with Dr. Gildardo Cranker for March 22 at 11 am.  [facility] instructed patient to arrive at appointment at 1030am to bring her insurance card and medication lists.  Patient confirms current address in EPIC.  Will follow up with patient on Monday.

## 2015-08-11 NOTE — Discharge Instructions (Signed)
Read the information below.  You may return to the Emergency Department at any time for worsening condition or any new symptoms that concern you.  The case managers have set up a follow up appointment for you in March.  They will follow up with you so that you can hopefully get in to see her (or another primary care provider) sooner.  Your thyroid blood test is pending and you have an order in place for an outpatient ultrasound so that all of the information will be available to your primary care provider at your appointment.  If you develop severe symptoms prior to your appointment (see information below), return to the ER for a recheck.     Goiter A goiter is an enlarged thyroid gland. The thyroid gland is located in the lower front of the neck. The gland produces hormones that regulate mood, body temperature, pulse rate, and digestion. Most goiters are painless and are not a cause for serious concern. Goiters and conditions that cause goiters can be treated, if necessary. CAUSES Causes of this condition include:  Diseases that attack healthy cells in your body (autoimmune diseases) and affect your thyroid function, such as:  Graves disease. This causes too much thyroid hormone to be produced and it makes your thyroid overly active (hyperthyroidism).  Hashimoto disease. This type of inflammation of the thyroid (thyroiditis) causes too little thyroid hormone to be produced and it makes your thyroid not active enough (hypothyroidism).  Other conditions that cause thyroiditis.  Nodular goiter. This means that there are one or more small growths on your thyroid. These can create too much thyroid hormone.  Pregnancy.  Thyroid cancer. This is rare.  Certain medicines.  Radiation exposure.  Iodine deficiency. In some cases, the cause may not be known (idiopathic). RISK FACTORS This condition is more likely to develop in:  People who have a family history of goiter.  Women.  People who  do not get enough iodine in their diet.  People who are older than 80.  People who smoke tobacco. SYMPTOMS Common symptoms of this condition include:  Swelling in the lower part of the neck. This swelling can range from a very small bump to a large lump.  A tight feeling in the throat.  A hoarse voice. Other symptoms include:   Coughing.  Wheezing.  Difficulty swallowing.  Difficulty breathing.  Bulging neck veins.  Dizziness. In some cases, there are no symptoms and thyroid hormone levels may be normal. When a goiter is the result of hyperthyroidism, symptoms may also include:  Nervousness or restlessness.  Inability to tolerate heat.  Unexplained weight loss.  Diarrhea.  Change in the texture of hair or skin.  Changes in heart beat, such as skipped beats, extra beats, or a rapid heart rate.  Loss of menstruation.  Shaky hands.  Increased appetite.  Sleep problems. When a goiter is the result of hypothyroidism, symptoms may also include:  Feeling like you have no energy (lethargy).  Inability to tolerate cold.  Weight gain that is not explained by a change in diet or exercise habits.  Dry skin.  Coarse hair.  Menstrual irregularity.  Constipation.  Sadness or depression. DIAGNOSIS This condition may be diagnosed with a medical history and physical exam. You may also have other tests, including:  Blood tests to check thyroid function.  Imaging tests, such as:  Ultrasonography.  CT scan.  MRI.  Thyroid scan. You will be given a safe radioactive injection, then images will be taken  of your thyroid.  Tissue sample (biopsy) of the goiter or any nodules. This checks to see if the goiter or nodules are cancerous. TREATMENT Treatment for this condition depends on the cause. Treatment may include:  Medicines to control your thyroid.  Anti-inflammatory or steroid medicines, if inflammation is the cause.  Iodine supplements or changes in  diet, if the goiter is caused by iodine deficiency.  Radiation therapy.  Surgery to remove your thyroid. In some cases, no treatment is necessary, and your health care provider will monitor your condition at regular checkups. HOME CARE INSTRUCTIONS  Follow recommendations from your health care provider for any changes to your diet.  Take over-the-counter and prescription medicines only as told by your health care provider.  Do not use any tobacco products, including cigarettes, chewing tobacco, or e-cigarettes. If you need help quitting, ask your health care provider.  Keep all follow-up appointments as told by your health care provider. This is important. SEEK MEDICAL CARE IF:  Your symptoms do not get better with treatment. SEEK IMMEDIATE MEDICAL CARE IF:  You develop sudden, unexplained confusion or other mental changes.  You have nausea, vomiting, or diarrhea.  You develop a fever.  Your skin or the whites of your eyes appear yellow (jaundice).  You develop chest pain.  You have trouble breathing or swallowing.  You suddenly become very weak.  You experience extreme restlessness.   This information is not intended to replace advice given to you by your health care provider. Make sure you discuss any questions you have with your health care provider.   Document Released: 12/19/2009 Document Revised: 11/15/2014 Document Reviewed: 06/27/2014 Elsevier Interactive Patient Education 2016 [employer].    Emergency Department Resource Guide 1) Find a Doctor and Pay Out of Pocket Although you won't have to find out who is covered by your insurance plan, it is a good idea to ask around and get recommendations. You will then need to call the office and see if the doctor you have chosen will accept you as a new patient and what types of options they offer for patients who are self-pay. Some doctors offer discounts or will set up payment plans for their patients who do not have  insurance, but you will need to ask so you aren't surprised when you get to your appointment.  2) Contact Your Local Health Department Not all health departments have doctors that can see patients for sick visits, but many do, so it is worth a call to see if yours does. If you don't know where your local health department is, you can check in your phone book. The CDC also has a tool to help you locate your state's health department, and many state websites also have listings of all of their local health departments.  3) Find a [facility] If your illness is not likely to be very severe or complicated, you may want to try a walk in clinic. These are popping up all over the country in pharmacies, drugstores, and shopping centers. They're usually staffed by nurse practitioners or physician assistants that have been trained to treat common illnesses and complaints. They're usually fairly quick and inexpensive. However, if you have serious medical issues or chronic medical problems, these are probably not your best option.  No Primary Care Doctor: - Call Health Connect at  [phone] - they can help you locate a primary care doctor that  accepts your insurance, provides certain services, etc. - Physician Referral Service- [phone]  Chronic Pain Problems: Organization         Address  Phone   Notes  [facility]  [phone] Patients need to be referred by their primary care doctor.   Medication Assistance: Organization         Address  Phone   Notes  [facility] [address] [place], [place] [address] [phone] --Must be a resident of [facility] -- Must have NO insurance coverage whatsoever (no Medicaid/ Medicare, etc.) -- The pt. MUST have a primary care doctor that directs their care regularly and follows them in the community   Med[facility]ist  [phone]   [employer]  [phone]    Agencies that provide  inexpensive medical care: Organization         Address  Phone   Notes  [place]  [phone]   [facility]    [phone]   [facility] Agency, [place] [address] [phone]   [place] [address], [place] [phone]   Planned Parenthood    [phone]   [facility]    [phone]   [place] and [place] [address], [place] Phone:  [phone], Fax:  [phone] Hours of Operation:  9 am - 6 pm, M-F.  Also accepts Medicaid/Medicare and self-pay.  [facility] for [place] [address], [address], [place] Phone: [phone], Fax: [phone]. Hours of Operation:  8:30 am - 5:30 pm, M-F.  Also accepts Medicaid and self-pay.  [facility] [address] [place] Phone: [phone]   [place], [place], [place] [phone], Ext. [phone] Mondays & Thursdays: 7-9 AM.  First 15 patients are seen on a first come, first serve basis.    [place] Providers:  Organization         Address  Phone   Notes  [facility] [address],  [phone] Also accepts self-pay patients.  [facility] [id number] [place], [place]  [phone]   [place], [address], [place] [phone]   [facility] [address] [place] [phone]   Lucianne Lei [address], [place]   [phone] Only accepts [place] Access [place] patients after they have their name applied to their card.   Self-Pay (no insurance) in [facility]:  Organization         Address  Phone   Notes  Sickle Cell Patients, [facility] [place] [phone]   [facility] Urgent  Care [place] [phone]   [facility] [place]  [place], [place], [place] [phone]   [facility]/Dr. Osei-Bonsu  [address], [place] or [address], [place] [phone] Phone number for both [place] and [place] locations is the same.  Urgent Medical and [facility] [address] [place] [phone]   [facility] [address], [place] or [address] [phone]   [facility] [address], [place] [phone], phone; [phone], fax Sees  patients 1st and 3rd Saturday of every month.  Must not qualify for public or private insurance (i.e. Medicaid, Medicare, [place] Health Choice, Veterans' Benefits)  Household income should be no more than 200% of the poverty level The clinic cannot treat you if you are pregnant or think you are pregnant  Sexually transmitted diseases are not treated at the clinic.    Dental Care: Organization         Address  Phone  Notes  [facility] Department of [facility] [place] [phone] Accepts children up to age 76 who are enrolled in [place] or [place]; pregnant women with a Medicaid card; and children who have applied for Medicaid or [place] Health Choice, but were declined, whose parents can pay a reduced fee at time of service.  [facility] Department of [facility]  [address], [place] [phone] Accepts children up to age 27 who are enrolled in [place] or [place]; pregnant women with a Medicaid card; and children who have applied for Medicaid or  Health Choice, but were declined, whose parents can pay a reduced fee at time of service.  [facility] Adult [facility] Access PROGRAM  [place] [phone] Patients are seen by appointment only. Walk-ins are  not accepted. [place] will see patients 52 years of age and older. Monday - Tuesday (8am-5pm) Most Wednesdays (8:30-5pm) $30 per visit, cash only  [facility] Adult Dental Access PROGRAM  [address], [facility] [phone] Patients are seen by appointment only. Walk-ins are not accepted. [place] will see patients 18 years of age and older. One Wednesday Evening (Monthly: Volunteer Based).  $30 per visit, cash only  IXL  [phone] for adults; Children under age 2, call Graduate Pediatric Dentistry at [phone]. Children aged 77-14, please call [phone] to request a pediatric application.  Dental services are provided in all areas of dental care including fillings, crowns and bridges, complete and partial dentures, implants, gum treatment, root canals, and extractions. Preventive care is also provided. Treatment is provided to both adults and children. Patients are selected via a lottery and there is often a waiting list.   [facility] [address] [place]  [phone] [email or web address]   [facility] [address] [place], [place] [phone], Ext. [phone] Second and Fourth Thursday of each month, opens at 6:30 AM; Clinic ends at 9 AM.  Patients are seen on a first-come first-served basis, and a limited number are seen during each clinic.   [facility]  [address] [place] Danker [place], [place] [phone]   Eligibility Requirements You must have lived in [place], [place], or [place] counties for at least the last three months.   You cannot be eligible for state or federal sponsored [employer], including [employer], [place], or [employer].   You generally cannot be eligible for healthcare insurance through your employer.    How to apply: Eligibility screenings are held every Tuesday and Wednesday afternoon from 1:00 pm until 4:00 pm. You do not need an appointment for  the interview!  [facility] [address] [place], [place]   [place]  [place] Department  [place]  [phone]    Behavioral Health Resources in the Community: Intensive Outpatient Programs  Organization         Address  Phone  Notes  [employer] [facility] [address] [place], [place] [phone]   [facility]patient [address], [place], [place]   ADS: Alcohol & Drug Svcs [address] [place], [place]   [place] [address]  [place], [place] or [phone]   Substance Abuse Resources Organization         Address  Phone  Notes  Alcohol and Drug Services  [phone]   [place]  [phone]   [facility] [place]   Chinita Pester  [phone]   Residential & Outpatient Substance Abuse Program  [phone]   Psychological Services Organization         Address  Phone  Notes  [facility]  [place]  [phone]   [place] [address], [place] or [phone]    Mobile Crisis Teams Organization         Address  Phone  Notes  [facility], Mobile Crisis Care Unit  [phone]   [facility]  [address] [place], [place]   [place] [address] [place] [phone]    Self-Help/Support Groups Organization         Address  Phone             Notes  [place]. of [place] - variety of support groups  [place] Call for more information  Narcotics Anonymous (NA), [facility] [address], [employer] [place]  2 meetings at this location   Special educational needs teacher         Address  Phone  Notes  ASAP Residential Treatment  [place],    [place]  [phone]   [facility]  [address] [place] [phone], [place], [place]   [place], [place] [phone] Admissions: 8am-3pm M-F  Incentives Substance [place] [address]    [place], [place] [phone]   [facility] [address] [place], [place], [place]   [facility] [address],  [place], [place]   [facility] Programs - Intensive Outpatient [address], [place], [place]   [facility] ([place] [address].,  [place], [place] [phone] or [phone]   [facility]idential Treatment Services (RTS) [address], [place], [place] Accepts Medicaid  [place] [address],  [place] 1-7742379695 Substance Abuse/Addiction Treatment   [facility] Organization         Address  Phone  Notes  [facility]  [phone]   Domenic Schwab, PhD [address] Arlis [place], [place]   [phone] or [phone]   Philippi Inglis [place], [place] [phone]   [place] [address] [place], [place] [phone] Insurance/Medicaid/sponsorship through [facility] and Families [address], [place]                                    [place], [place] [phone] [place] [address], [place] [phone]    Dr. Adele Schilder  (  [phone]   [facility] Dept. 1) [address] [place] 2) [place] 3)  [address], [place] [phone]  [phone]   [place] [phone] or [phone] (After Hours)

## 2015-08-11 NOTE — Progress Notes (Signed)
LVM at Dr Johnnye Lana office to request appt Left pt # and address, insurance coverage plus ED AM Cm Mobile # for return call

## 2015-08-11 NOTE — ED Notes (Signed)
Pt has been coughing and a runny nose for the past several days. Pt noticed swelling on the anterior side of her neck for the past few days. No SOB, sore throat or throat swelling. Normally does not have swelling in neck.

## 2015-08-11 NOTE — ED Provider Notes (Signed)
CSN: [id number]     Arrival date & time 08/11/15  1540 History   First MD Initiated Contact with Patient 08/11/15 1556     Chief Complaint  Patient presents with  . neck swelling      (Consider location/radiation/quality/duration/timing/severity/associated sxs/prior Treatment) HPI   Pt presents with increasing swelling in her anterior neck that is nontender that she noticed 3 days ago.  She is concerned because her mother had thyroid problems, she is unsure of what kind of problem.   She has chronic sinus problems and chronic bronchitis/asthma for which she uses albuterol (last use 2 weeks ago) - these problems are unchanged.  She otherwise is feeling well.  Denies fevers, sweats, palpitations, dry skin, fatigue, sore throat, difficulty swallowing or breathing.  She is concerned because of her mother's thyroid problem and because she finds the swelling unsightly.    Past Medical History  Diagnosis Date  . Bartholin cyst   . Hernia, inguinal   . Hernia, inguinal   . Asthma    Past Surgical History  Procedure Laterality Date  . No past surgeries     History reviewed. No pertinent family history. Social History  Substance Use Topics  . Smoking status: Current Every Day Smoker -- 0.00 packs/day    Types: Cigarettes  . Smokeless tobacco: None  . Alcohol Use: No   OB History    Gravida Para Term Preterm AB TAB SAB Ectopic Multiple Living   2 2 2       2      Review of Systems  All other systems reviewed and are negative.     Allergies  Percocet  Home Medications   Prior to Admission medications   Medication Sig Start Date End Date Taking? Authorizing Provider  albuterol (PROVENTIL HFA;VENTOLIN HFA) 108 (90 BASE) MCG/ACT inhaler Inhale 2 puffs into the lungs every 6 (six) hours as needed for shortness of breath.   Yes Historical Provider, MD  ibuprofen (ADVIL) 200 MG tablet Take 400 mg by mouth every 6 (six) hours as needed for moderate pain.   Yes Historical Provider, MD   butalbital-acetaminophen-caffeine (FIORICET) 50-325-40 MG tablet Take 1-2 tablets by mouth every 6 (six) hours as needed for headache. 06/14/15 06/13/16  Kristen N Ward, DO  ondansetron (ZOFRAN ODT) 4 MG disintegrating tablet Take 1 tablet (4 mg total) by mouth every 8 (eight) hours as needed for nausea or vomiting. 06/14/15   Kristen N Ward, DO   BP 158/104 mmHg  Pulse 70  Temp(Src) 97.4 F (36.3 C) (Oral)  Resp 14  SpO2 100% Physical Exam  Constitutional: She appears well-developed and well-nourished. No distress.  HENT:  Head: Normocephalic and atraumatic.  Eyes: Conjunctivae are normal.  Neck: Trachea normal, normal range of motion and phonation normal. Neck supple. No tracheal tenderness present. No edema and no erythema present. Thyromegaly present. No thyroid mass present.  Cardiovascular: Normal rate and regular rhythm.   Pulmonary/Chest: Effort normal and breath sounds normal. No stridor. No respiratory distress. She has no wheezes. She has no rales.  Neurological: She is alert.  Skin: She is not diaphoretic.  Nursing note and vitals reviewed.   ED Course  Procedures (including critical care time) Labs Review Labs Reviewed  TSH  I-STAT CHEM 8, ED    Imaging Review No results found. I have personally reviewed and evaluated these images and lab results as part of my medical decision-making.   EKG Interpretation None      MDM   Final  diagnoses:  Thyromegaly    Afebrile, nontoxic patient with 3 days of anterior neck swelling that appears to be thyromegaly.  History of thyroid problems in her mother, but unsure what the problem was.  Pt is having no symptoms.  There are no airway concerns and I doubt significant mass effect on neck structures currently.  TSH ordered and is pending.  Outpatient thyroid US ordered.  These were ordered given patient's indication that this problem has arisen quickly and pt currently has no PCP care.  Case manager has set up an  appointment with a PCP for March but will follow up with patient Monday morning to attempt to schedule a closer follow up appointment.   Chem 8 unremarkable.  D/C home with resources, PCP follow up.  Pt advised TSH is still pending and [place] will need to be performed and that PCP must see her to fully evaluate and treat this problem.  Discussed result, findings, treatment, and follow up  with patient.  Pt given return precautions.  Pt verbalizes understanding and agrees with plan.        Clayton Bibles, PA-C 08/11/15 1823  Virgel Manifold, MD 08/16/15 [phone]

## 2015-08-14 NOTE — Progress Notes (Signed)
[facility] called patient for follow up.  Patient reports no one called her for an appointment.  EDCM received patient's permission to call and try to set up an appointment with Dr. Willey Blade [phone].  Patient prefers Monday appointments in the AM.  Will follow up with patient tomorrow.  No further EDCM needs at this time.

## 2015-08-15 NOTE — Progress Notes (Addendum)
[facility] called office of Dr. Bryon Lions, no appointments until May.  [facility] called office of Dr. Joelene Millin [place], does not accept patient's insurance.  EDCM called and obtained appointment for patient with Dr. Antony Blackbird of [facility] for Mon Aug 28, 2015 at 1030am.  [facility] will make patient aware.  No further EDCM needs at this time.  08/15/2015 A. Vaneta Hammontree RNCM 1815pm EDCM spoke to patient and provided patient with date and time of new pcp appointment.  Patient requested [facility] leave her a voice mail with contact information for Dr. Chapman Fitch office and dtae and time of her appointment.  EDCM left voice mail for patient with above information.  No further EDCM needs at this time.

## 2015-08-21 ENCOUNTER — Ambulatory Visit (HOSPITAL_COMMUNITY)
Admission: RE | Admit: 2015-08-21 | Discharge: 2015-08-21 | Disposition: A | Discharge status: Home / Self Care | Payer: 59 | Source: Ambulatory Visit | Attending: Physician Assistant | Admitting: Physician Assistant

## 2015-08-21 ENCOUNTER — Ambulatory Visit (HOSPITAL_COMMUNITY): Payer: 59

## 2015-08-21 DIAGNOSIS — E010 Iodine-deficiency related diffuse (endemic) goiter: Secondary | ICD-10-CM | POA: Diagnosis not present

## 2015-08-21 DIAGNOSIS — E042 Nontoxic multinodular goiter: Secondary | ICD-10-CM | POA: Insufficient documentation

## 2015-09-01 ENCOUNTER — Other Ambulatory Visit: Payer: Self-pay | Admitting: Family Medicine

## 2015-09-01 DIAGNOSIS — E041 Nontoxic single thyroid nodule: Secondary | ICD-10-CM

## 2015-09-05 ENCOUNTER — Other Ambulatory Visit: Payer: Self-pay | Admitting: Family Medicine

## 2015-09-05 DIAGNOSIS — E041 Nontoxic single thyroid nodule: Secondary | ICD-10-CM

## 2015-09-25 ENCOUNTER — Encounter: Payer: Self-pay | Admitting: Endocrinology

## 2015-09-25 ENCOUNTER — Ambulatory Visit (INDEPENDENT_AMBULATORY_CARE_PROVIDER_SITE_OTHER): Payer: 59 | Admitting: Endocrinology

## 2015-09-25 VITALS — BP 114/76 | HR 71 | Temp 97.9°F | Resp 12 | Ht 64.0 in | Wt 144.0 lb

## 2015-09-25 DIAGNOSIS — E0590 Thyrotoxicosis, unspecified without thyrotoxic crisis or storm: Secondary | ICD-10-CM | POA: Diagnosis not present

## 2015-09-25 NOTE — Patient Instructions (Addendum)
I have ordered for you a treatment pill of radioactive iodine.  Although it is a larger amount of radiation, you will again notice no symptoms from this.  The pill is gone from your body in a few days (during which you should stay away from other people), but takes several months to work.  Therefore, please return here approximately 6-8 weeks after the treatment.  This treatment has been available for many years, and the only known side-effect is an underactive thyroid.  It is possible that i would eventually prescribe for you a thyroid hormone pill, which is very inexpensive.  You don't have to worry about side-effects of this thyroid hormone pill, because it is the same molecule your thyroid makes.   

## 2015-09-25 NOTE — Progress Notes (Signed)
Subjective:    Patient ID: Carol Mack, female    DOB: 1971-04-10, 45 y.o.   MRN: [id number]  HPI Pt states 1 month of moderate swelling t the anterior neck, but no assoc pain.  she has never been on therapy for this.  she has never had XRT to the anterior neck, or thyroid surgery.  she does not consume kelp or any other prescribed or non-prescribed thyroid medication.  she has never been on amiodarone.   Past Medical History  Diagnosis Date  . Bartholin cyst   . Hernia, inguinal   . Hernia, inguinal   . Asthma     Past Surgical History  Procedure Laterality Date  . No past surgeries      Social History   Social History  . Marital Status: Single    Spouse Name: N/A  . Number of Children: N/A  . Years of Education: N/A   Occupational History  . Not on file.   Social History Main Topics  . Smoking status: Current Every Day Smoker -- 0.00 packs/day    Types: Cigarettes  . Smokeless tobacco: Not on file  . Alcohol Use: No  . Drug Use: No  . Sexual Activity: Not on file   Other Topics Concern  . Not on file   Social History Narrative    Current Outpatient Prescriptions on File Prior to Visit  Medication Sig Dispense Refill  . albuterol (PROVENTIL HFA;VENTOLIN HFA) 108 (90 BASE) MCG/ACT inhaler Inhale 2 puffs into the lungs every 6 (six) hours as needed for shortness of breath.    . butalbital-acetaminophen-caffeine (FIORICET) 50-325-40 MG tablet Take 1-2 tablets by mouth every 6 (six) hours as needed for headache. 20 tablet 0  . ondansetron (ZOFRAN ODT) 4 MG disintegrating tablet Take 1 tablet (4 mg total) by mouth every 8 (eight) hours as needed for nausea or vomiting. 20 tablet 0  . [DISCONTINUED] fluticasone (FLONASE) 50 MCG/ACT nasal spray Place 2 sprays into both nostrils daily. (Patient not taking: Reported on 06/14/2015) 16 g 2   No current facility-administered medications on file prior to visit.    Allergies  Allergen Reactions  . Percocet  [Oxycodone-Acetaminophen] Other (See Comments)    jittery    Family History  Problem Relation Age of Onset  . Thyroid disease Mother     large benign goiter    BP 114/76 mmHg  Pulse 71  Temp(Src) 97.9 F (36.6 C) (Oral)  Resp 12  Ht 5\' 4"  (1.626 m)  Wt 144 lb (65.318 kg)  BMI 24.71 kg/m2  SpO2 98%  Review of Systems denies weight loss, hoarseness, visual loss, palpitations, sob, diarrhea, muscle weakness, excessive diaphoresis, tremor, anxiety, and heat intolerance.  She has chronic intermittent headache, easy bruising, rhinorrhea, nocturia, and fatigue.      Objective:   Physical Exam VS: see vs page GEN: no distress HEAD: head: no deformity eyes: no periorbital swelling, no proptosis external nose and ears are normal mouth: no lesion seen NECK: supple, thyroid is 5-10 times normal size, with multinodular surface.   CHEST WALL: no deformity LUNGS:  Clear to auscultation CV: reg rate and rhythm, no murmur MUSCULOSKELETAL: muscle bulk and strength are grossly normal.  no obvious joint swelling.  gait is normal and steady EXTEMITIES: no deformity.  no edema NEURO:  cn 2-12 grossly intact.   readily moves all 4's.  sensation is intact to touch on all 4's.  No tremor.  SKIN:  Normal texture and temperature.  No rash  or suspicious lesion is visible.  Not diaphoretic NODES:  None palpable at the neck PSYCH: alert, well-oriented.  Does not appear anxious nor depressed.    outside test results are reviewed:  TSH=0.35  [place]: large multinodular goiter  I have reviewed outside records, and summarized: Pt was noted to have large goiter, and referred here.     Assessment & Plan:  Large multinodular goiter: she does not need bx, as risk of malignancy is low (due to suppresed TSH and pos FHX). Hyperthyroidism: new to me. due to the goiter.  It is mild, as is usually the case with multinodular goiter.    Patient is advised the following: Patient Instructions  I have ordered  for you a treatment pill of radioactive iodine.  Although it is a larger amount of radiation, you will again notice no symptoms from this.  The pill is gone from your body in a few days (during which you should stay away from other people), but takes several months to work.  Therefore, please return here approximately 6-8 weeks after the treatment.  This treatment has been available for many years, and the only known side-effect is an underactive thyroid.  It is possible that i would eventually prescribe for you a thyroid hormone pill, which is very inexpensive.  You don't have to worry about side-effects of this thyroid hormone pill, because it is the same molecule your thyroid makes.

## 2015-10-04 ENCOUNTER — Ambulatory Visit: Payer: Self-pay | Admitting: Internal Medicine

## 2015-10-19 ENCOUNTER — Other Ambulatory Visit: Payer: Self-pay | Admitting: Endocrinology

## 2015-10-19 DIAGNOSIS — E0590 Thyrotoxicosis, unspecified without thyrotoxic crisis or storm: Secondary | ICD-10-CM

## 2015-10-24 ENCOUNTER — Encounter (HOSPITAL_COMMUNITY): Payer: 59

## 2015-10-25 ENCOUNTER — Encounter (HOSPITAL_COMMUNITY): Payer: 59

## 2015-10-31 ENCOUNTER — Telehealth (HOSPITAL_COMMUNITY): Payer: Self-pay | Admitting: Endocrinology

## 2015-11-01 ENCOUNTER — Encounter (HOSPITAL_COMMUNITY): Payer: 59

## 2015-11-02 ENCOUNTER — Encounter (HOSPITAL_COMMUNITY): Payer: 59

## 2015-11-15 ENCOUNTER — Encounter (HOSPITAL_COMMUNITY): Admission: RE | Admit: 2015-11-15 | Payer: 59 | Source: Ambulatory Visit

## 2015-11-16 ENCOUNTER — Encounter (HOSPITAL_COMMUNITY): Payer: 59

## 2015-12-06 ENCOUNTER — Encounter (HOSPITAL_COMMUNITY)
Admission: RE | Admit: 2015-12-06 | Discharge: 2015-12-06 | Disposition: A | Discharge status: Home / Self Care | Payer: 59 | Source: Ambulatory Visit | Attending: Endocrinology | Admitting: Endocrinology

## 2015-12-06 DIAGNOSIS — E0590 Thyrotoxicosis, unspecified without thyrotoxic crisis or storm: Secondary | ICD-10-CM | POA: Insufficient documentation

## 2015-12-06 MED ORDER — SODIUM IODIDE I 131 CAPSULE
10.5000 | Freq: Once | INTRAVENOUS | Status: AC | PRN
  Administered 2015-12-06: 10.5 via ORAL

## 2015-12-07 MED ORDER — SODIUM PERTECHNETATE TC 99M INJECTION
10.0000 | Freq: Once | INTRAVENOUS | Status: AC | PRN
  Administered 2015-12-07: 10 via INTRAVENOUS

## 2015-12-28 ENCOUNTER — Other Ambulatory Visit: Payer: Self-pay | Admitting: Obstetrics & Gynecology

## 2015-12-28 DIAGNOSIS — B9689 Other specified bacterial agents as the cause of diseases classified elsewhere: Secondary | ICD-10-CM

## 2015-12-28 DIAGNOSIS — N760 Acute vaginitis: Principal | ICD-10-CM

## 2016-01-19 ENCOUNTER — Encounter: Payer: Self-pay | Admitting: Endocrinology

## 2016-01-19 ENCOUNTER — Ambulatory Visit (INDEPENDENT_AMBULATORY_CARE_PROVIDER_SITE_OTHER): Payer: 59 | Admitting: Endocrinology

## 2016-01-19 ENCOUNTER — Other Ambulatory Visit (HOSPITAL_COMMUNITY)
Admission: RE | Admit: 2016-01-19 | Discharge: 2016-01-19 | Disposition: A | Discharge status: Home / Self Care | Payer: 59 | Source: Ambulatory Visit | Attending: Endocrinology | Admitting: Endocrinology

## 2016-01-19 VITALS — BP 122/82 | HR 83 | Ht 64.0 in | Wt 138.0 lb

## 2016-01-19 DIAGNOSIS — E042 Nontoxic multinodular goiter: Secondary | ICD-10-CM | POA: Insufficient documentation

## 2016-01-19 NOTE — Progress Notes (Signed)
Subjective:    Patient ID: Carol Mack, female    DOB: [birth year], 45 y.o.   MRN: [id number]  HPI Pt returns for f/u of hyperthyroidism (due to large multinodular goiter; risk of malignancy was low, but radiol advised bx prior to RAI rx).  She says she continues to notice the goiter Past Medical History  Diagnosis Date  . Bartholin cyst   . Hernia, inguinal   . Hernia, inguinal   . Asthma     Past Surgical History  Procedure Laterality Date  . No past surgeries      Social History   Social History  . Marital Status: Single    Spouse Name: N/A  . Number of Children: N/A  . Years of Education: N/A   Occupational History  . Not on file.   Social History Main Topics  . Smoking status: Current Every Day Smoker -- 0.00 packs/day    Types: Cigarettes  . Smokeless tobacco: Not on file  . Alcohol Use: No  . Drug Use: No  . Sexual Activity: Not on file   Other Topics Concern  . Not on file   Social History Narrative    Current Outpatient Prescriptions on File Prior to Visit  Medication Sig Dispense Refill  . albuterol (PROVENTIL HFA;VENTOLIN HFA) 108 (90 BASE) MCG/ACT inhaler Inhale 2 puffs into the lungs every 6 (six) hours as needed for shortness of breath.    . Ibuprofen (ADVIL) 200 MG CAPS Take 2 capsules by mouth as needed.    . metroNIDAZOLE (METROGEL) 0.75 % vaginal gel     . metroNIDAZOLE (METROGEL) 0.75 % vaginal gel INSERT ONE APPLICATORFUL VAGINALLY AT BEDTIME FOR 5 NIGHTS 70 g 0  . ondansetron (ZOFRAN ODT) 4 MG disintegrating tablet Take 1 tablet (4 mg total) by mouth every 8 (eight) hours as needed for nausea or vomiting. 20 tablet 0  . butalbital-acetaminophen-caffeine (FIORICET) 50-325-40 MG tablet Take 1-2 tablets by mouth every 6 (six) hours as needed for headache. (Patient not taking: Reported on 01/19/2016) 20 tablet 0  . [DISCONTINUED] fluticasone (FLONASE) 50 MCG/ACT nasal spray Place 2 sprays into both nostrils daily. (Patient not taking: Reported on  06/14/2015) 16 g 2   No current facility-administered medications on file prior to visit.    Allergies  Allergen Reactions  . Percocet [Oxycodone-Acetaminophen] Other (See Comments)    jittery    Family History  Problem Relation Age of Onset  . Thyroid disease Mother     large benign goiter    BP 122/82 mmHg  Pulse 83  Ht 5\' 4"  (1.626 m)  Wt 138 lb (62.596 kg)  BMI 23.68 kg/m2  SpO2 98%    Review of Systems No weight change.      Objective:   Physical Exam VITAL SIGNS:  See vs page. GENERAL: no distress.  NECK: supple, thyroid is 5-10 times normal size (R>L), with multinodular surface.     Thyroid needle bx: consent obtained, signed form on chart The area is first sprayed with cooling agent local: xylocaine 2%, with epinephrine prep: alcohol pad 4 bxs are done with 25 and 123XX123 needles no complications.    Lab Results  Component Value Date   TSH 0.339* 08/11/2015      Assessment & Plan:  multinodular goiter, clinically unchanged.   Hyperthyroidism, mild, due to the goiter.    Patient is advised the following: Patient Instructions  We'll let you know about the biopsy results.   If as expected, no cancer is  found, we go ahead with the radioactive iodine pill.   Although it is a larger amount of radiation than the test, you will again notice no symptoms from this.  The pill is gone from your body in a few days (during which you should stay away from other people), but takes several months to work.  Therefore, please return here approximately 6-8 weeks after the treatment.  This treatment has been available for many years, and the only known side-effect is an underactive thyroid.  It is possible that i would eventually prescribe for you a thyroid hormone pill, which is very inexpensive.  You don't have to worry about side-effects of this thyroid hormone pill, because it is the same molecule your thyroid makes.     Renato Shin, MD

## 2016-01-19 NOTE — Patient Instructions (Addendum)
We'll let you know about the biopsy results.   If as expected, no cancer is found, we go ahead with the radioactive iodine pill.   Although it is a larger amount of radiation than the test, you will again notice no symptoms from this.  The pill is gone from your body in a few days (during which you should stay away from other people), but takes several months to work.  Therefore, please return here approximately 6-8 weeks after the treatment.  This treatment has been available for many years, and the only known side-effect is an underactive thyroid.  It is possible that i would eventually prescribe for you a thyroid hormone pill, which is very inexpensive.  You don't have to worry about side-effects of this thyroid hormone pill, because it is the same molecule your thyroid makes.

## 2016-02-26 ENCOUNTER — Encounter: Payer: Self-pay | Admitting: Endocrinology

## 2016-02-26 ENCOUNTER — Other Ambulatory Visit (HOSPITAL_COMMUNITY)
Admission: RE | Admit: 2016-02-26 | Discharge: 2016-02-26 | Disposition: A | Discharge status: Home / Self Care | Payer: 59 | Source: Ambulatory Visit | Attending: Endocrinology | Admitting: Endocrinology

## 2016-02-26 ENCOUNTER — Ambulatory Visit (INDEPENDENT_AMBULATORY_CARE_PROVIDER_SITE_OTHER): Payer: 59 | Admitting: Endocrinology

## 2016-02-26 VITALS — BP 128/96 | HR 84 | Temp 98.4°F | Ht 64.0 in | Wt 140.6 lb

## 2016-02-26 DIAGNOSIS — E042 Nontoxic multinodular goiter: Secondary | ICD-10-CM

## 2016-02-26 DIAGNOSIS — E0590 Thyrotoxicosis, unspecified without thyrotoxic crisis or storm: Secondary | ICD-10-CM | POA: Diagnosis not present

## 2016-02-26 NOTE — Patient Instructions (Signed)
We'll let you know about the biopsy results.   If as expected, no cancer is found, we go ahead with the radioactive iodine pill.   Although it is a larger amount of radiation than the test, you will again notice no symptoms from this.  The pill is gone from your body in a few days (during which you should stay away from other people), but takes several months to work.  Therefore, please return here approximately 6-8 weeks after the treatment.  This treatment has been available for many years, and the only known side-effect is an underactive thyroid.  It is possible that i would eventually prescribe for you a thyroid hormone pill, which is very inexpensive.  You don't have to worry about side-effects of this thyroid hormone pill, because it is the same molecule your thyroid makes.

## 2016-02-26 NOTE — Progress Notes (Signed)
Pre visit review using our clinic tool,if applicable. No additional management support is needed unless otherwise documented below in the visit note.  

## 2016-02-26 NOTE — Progress Notes (Signed)
Subjective:    Patient ID: Carol Mack, female    DOB: 1970-09-25, 45 y.o.   MRN: [id number]  HPI Pt returns for f/u of hyperthyroidism (due to large multinodular goiter; risk of malignancy was low, but radiol advised bx prior to RAI rx; right lobe bx in 2017 was beth Cat 1).  She says she continues to notice the goiter.   Past Medical History:  Diagnosis Date  . Asthma   . Bartholin cyst   . Hernia, inguinal   . Hernia, inguinal     Past Surgical History:  Procedure Laterality Date  . NO PAST SURGERIES      Social History   Social History  . Marital status: Single    Spouse name: N/A  . Number of children: N/A  . Years of education: N/A   Occupational History  . Not on file.   Social History Main Topics  . Smoking status: Current Every Day Smoker    Packs/day: 0.00    Types: Cigarettes  . Smokeless tobacco: Not on file  . Alcohol use No  . Drug use: No  . Sexual activity: Not on file   Other Topics Concern  . Not on file   Social History Narrative  . No narrative on file    Current Outpatient Prescriptions on File Prior to Visit  Medication Sig Dispense Refill  . albuterol (PROVENTIL HFA;VENTOLIN HFA) 108 (90 BASE) MCG/ACT inhaler Inhale 2 puffs into the lungs every 6 (six) hours as needed for shortness of breath.    . Ibuprofen (ADVIL) 200 MG CAPS Take 2 capsules by mouth as needed.    . metroNIDAZOLE (METROGEL) 0.75 % vaginal gel INSERT ONE APPLICATORFUL VAGINALLY AT BEDTIME FOR 5 NIGHTS 70 g 0  . butalbital-acetaminophen-caffeine (FIORICET) 50-325-40 MG tablet Take 1-2 tablets by mouth every 6 (six) hours as needed for headache. (Patient not taking: Reported on 02/26/2016) 20 tablet 0  . metroNIDAZOLE (METROGEL) 0.75 % vaginal gel     . ondansetron (ZOFRAN ODT) 4 MG disintegrating tablet Take 1 tablet (4 mg total) by mouth every 8 (eight) hours as needed for nausea or vomiting. (Patient not taking: Reported on 02/26/2016) 20 tablet 0  . [DISCONTINUED]  fluticasone (FLONASE) 50 MCG/ACT nasal spray Place 2 sprays into both nostrils daily. (Patient not taking: Reported on 06/14/2015) 16 g 2   No current facility-administered medications on file prior to visit.     Allergies  Allergen Reactions  . Percocet [Oxycodone-Acetaminophen] Other (See Comments)    jittery    Family History  Problem Relation Age of Onset  . Thyroid disease Mother     large benign goiter    BP (!) 128/96   Pulse 84   Temp 98.4 F (36.9 C) (Oral)   Ht 5' 4"  (1.626 m)   Wt 140 lb 9.6 oz (63.8 kg)   LMP 02/02/2016   SpO2 98%   BMI 24.13 kg/m   \Review of Systems Denies dysphagia and sob.      Objective:   Physical Exam VITAL SIGNS:  See vs page GENERAL: no distress NECK: supple, thyroid is 5 times normal size, with irregular surface.    Lab Results  Component Value Date   TSH 0.339 (L) 08/11/2015    thyroid needle bx: consent obtained, signed form on chart The area is first sprayed with cooling agent local: xylocaine 2%, with epinephrine prep: alcohol pad 4 bxs are done with 25 and 57W needles no complications   Cytol: Beth Cat  2    Assessment & Plan:  Multinodular goiter, low risk of malignancy Mild hyperthyroidism, due to the goiter.  I ordered RAI.  Ret 6-8 weeks later

## 2016-02-29 ENCOUNTER — Telehealth: Payer: Self-pay

## 2016-02-29 ENCOUNTER — Telehealth: Payer: Self-pay | Admitting: Endocrinology

## 2016-02-29 NOTE — Telephone Encounter (Signed)
Called and left message for patient to return phone call for results.

## 2016-02-29 NOTE — Telephone Encounter (Signed)
Patient returning your call.

## 2016-03-01 NOTE — Telephone Encounter (Signed)
I contacted the Carol Mack and advised the thyroid biopsy results were negative for cancer and Dr. Loanne Drilling has placed the orders for the RAI. Carol Mack advised once the RAI is completed to return and see Dr. Loanne Drilling in 2 months per his request. Carol Mack voiced understanding.

## 2016-03-15 ENCOUNTER — Ambulatory Visit (HOSPITAL_COMMUNITY)
Admission: RE | Admit: 2016-03-15 | Discharge: 2016-03-15 | Disposition: A | Discharge status: Home / Self Care | Payer: 59 | Source: Ambulatory Visit | Attending: Endocrinology | Admitting: Endocrinology

## 2016-03-15 ENCOUNTER — Encounter (HOSPITAL_COMMUNITY): Payer: Self-pay

## 2016-03-15 DIAGNOSIS — E0590 Thyrotoxicosis, unspecified without thyrotoxic crisis or storm: Secondary | ICD-10-CM | POA: Insufficient documentation

## 2016-03-25 ENCOUNTER — Other Ambulatory Visit: Payer: Self-pay | Admitting: Obstetrics & Gynecology

## 2016-03-25 ENCOUNTER — Encounter (HOSPITAL_COMMUNITY)
Admission: RE | Admit: 2016-03-25 | Discharge: 2016-03-25 | Disposition: A | Discharge status: Home / Self Care | Payer: 59 | Source: Ambulatory Visit | Attending: Endocrinology | Admitting: Endocrinology

## 2016-03-25 DIAGNOSIS — N760 Acute vaginitis: Principal | ICD-10-CM

## 2016-03-25 DIAGNOSIS — E0590 Thyrotoxicosis, unspecified without thyrotoxic crisis or storm: Secondary | ICD-10-CM | POA: Insufficient documentation

## 2016-03-25 DIAGNOSIS — B9689 Other specified bacterial agents as the cause of diseases classified elsewhere: Secondary | ICD-10-CM

## 2016-03-25 LAB — HCG, SERUM, QUALITATIVE: Preg, Serum: NEGATIVE

## 2016-03-25 MED ORDER — SODIUM IODIDE I 131 CAPSULE
20.4000 | Freq: Once | INTRAVENOUS | Status: AC | PRN
  Administered 2016-03-25: 20.4 via ORAL

## 2016-04-04 ENCOUNTER — Other Ambulatory Visit (HOSPITAL_COMMUNITY)
Admission: RE | Admit: 2016-04-04 | Discharge: 2016-04-04 | Disposition: A | Discharge status: Home / Self Care | Payer: 59 | Source: Ambulatory Visit | Attending: Family Medicine | Admitting: Family Medicine

## 2016-04-04 ENCOUNTER — Other Ambulatory Visit: Payer: Self-pay | Admitting: Family Medicine

## 2016-04-04 DIAGNOSIS — Z01419 Encounter for gynecological examination (general) (routine) without abnormal findings: Secondary | ICD-10-CM | POA: Diagnosis not present

## 2016-04-05 ENCOUNTER — Other Ambulatory Visit: Payer: Self-pay | Admitting: Family Medicine

## 2016-04-05 DIAGNOSIS — R102 Pelvic and perineal pain: Secondary | ICD-10-CM

## 2016-04-08 ENCOUNTER — Other Ambulatory Visit: Payer: Self-pay | Admitting: Family Medicine

## 2016-04-08 DIAGNOSIS — Z1231 Encounter for screening mammogram for malignant neoplasm of breast: Secondary | ICD-10-CM

## 2016-04-08 LAB — CYTOLOGY - PAP

## 2016-04-19 ENCOUNTER — Other Ambulatory Visit: Payer: Self-pay | Admitting: Family Medicine

## 2016-04-19 DIAGNOSIS — R102 Pelvic and perineal pain: Secondary | ICD-10-CM

## 2016-06-04 ENCOUNTER — Ambulatory Visit: Payer: 59

## 2016-06-06 ENCOUNTER — Encounter (HOSPITAL_COMMUNITY): Payer: Self-pay | Admitting: *Deleted

## 2016-06-06 ENCOUNTER — Emergency Department (HOSPITAL_COMMUNITY)
Admission: EM | Admit: 2016-06-06 | Discharge: 2016-06-06 | Disposition: A | Discharge status: Home / Self Care | Payer: 59 | Attending: Emergency Medicine | Admitting: Emergency Medicine

## 2016-06-06 DIAGNOSIS — R519 Headache, unspecified: Secondary | ICD-10-CM

## 2016-06-06 DIAGNOSIS — J45909 Unspecified asthma, uncomplicated: Secondary | ICD-10-CM | POA: Insufficient documentation

## 2016-06-06 DIAGNOSIS — R51 Headache: Secondary | ICD-10-CM | POA: Insufficient documentation

## 2016-06-06 DIAGNOSIS — Z79899 Other long term (current) drug therapy: Secondary | ICD-10-CM | POA: Insufficient documentation

## 2016-06-06 DIAGNOSIS — F17210 Nicotine dependence, cigarettes, uncomplicated: Secondary | ICD-10-CM | POA: Diagnosis not present

## 2016-06-06 MED ORDER — ALBUTEROL SULFATE HFA 108 (90 BASE) MCG/ACT IN AERS
2.0000 | INHALATION_SPRAY | RESPIRATORY_TRACT | Status: DC | PRN
  Administered 2016-06-06: 2 via RESPIRATORY_TRACT
  Filled 2016-06-06: qty 6.7

## 2016-06-06 MED ORDER — SODIUM CHLORIDE 0.9 % IV BOLUS (SEPSIS)
1000.0000 mL | Freq: Once | INTRAVENOUS | Status: AC
  Administered 2016-06-06: 1000 mL via INTRAVENOUS

## 2016-06-06 MED ORDER — TRAMADOL HCL 50 MG PO TABS
50.0000 mg | ORAL_TABLET | Freq: Once | ORAL | Status: AC
  Administered 2016-06-06: 50 mg via ORAL
  Filled 2016-06-06: qty 1

## 2016-06-06 MED ORDER — PSEUDOEPHEDRINE HCL 30 MG PO TABS: 30.0000 mg | ORAL_TABLET | ORAL | 0 refills | Status: DC | PRN

## 2016-06-06 MED ORDER — DIPHENHYDRAMINE HCL 50 MG/ML IJ SOLN
25.0000 mg | Freq: Once | INTRAMUSCULAR | Status: AC
  Administered 2016-06-06: 25 mg via INTRAVENOUS
  Filled 2016-06-06: qty 1

## 2016-06-06 MED ORDER — KETOROLAC TROMETHAMINE 30 MG/ML IJ SOLN
30.0000 mg | Freq: Once | INTRAMUSCULAR | Status: AC
  Administered 2016-06-06: 30 mg via INTRAVENOUS
  Filled 2016-06-06: qty 1

## 2016-06-06 MED ORDER — ONDANSETRON HCL 4 MG/2ML IJ SOLN
4.0000 mg | Freq: Once | INTRAMUSCULAR | Status: AC
  Administered 2016-06-06: 4 mg via INTRAVENOUS
  Filled 2016-06-06: qty 2

## 2016-06-06 MED ORDER — DEXAMETHASONE 10 MG/ML FOR PEDIATRIC ORAL USE
10.0000 mg | Freq: Once | INTRAMUSCULAR | Status: DC
  Filled 2016-06-06: qty 1

## 2016-06-06 MED ORDER — DEXAMETHASONE SODIUM PHOSPHATE 10 MG/ML IJ SOLN
10.0000 mg | Freq: Once | INTRAMUSCULAR | Status: AC
  Administered 2016-06-06: 10 mg via INTRAVENOUS
  Filled 2016-06-06: qty 1

## 2016-06-06 NOTE — ED Triage Notes (Signed)
The pt is c/o a headache and a cold with sinus problems for 4-5 days.  lmp now

## 2016-06-06 NOTE — ED Provider Notes (Signed)
[facility] DEPT Provider Note   CSN: [id number] Arrival date & time: 06/06/16  0136     History   Chief Complaint Chief Complaint  Patient presents with  . Headache    HPI Carol Mack is a 45 y.o. female.  HPI   Patient to the ER with complaints of headache that started yesterday morning and sinus pressure. She admits that she gets headaches like these intermittently. The sinus pressure has persisted for 5 days. She has not tried to take any medication specifically for the headache. She feels pressure int he posterior portion of her scalp. She has not had fever, N, V, D. No neck pain, CP, back pain. She is having photophobia.  Past Medical History:  Diagnosis Date  . Asthma   . Bartholin cyst   . Hernia, inguinal   . Hernia, inguinal     Patient Active Problem List   Diagnosis Date Noted  . Multinodular goiter 01/19/2016  . Hyperthyroidism 09/25/2015  . Fibroid, uterine   . Fibroids 06/02/2014  . Menorrhagia with regular cycle   . Fibroid uterus 12/23/2013    Past Surgical History:  Procedure Laterality Date  . NO PAST SURGERIES      OB History    Gravida Para Term Preterm AB Living   2 2 2     2    SAB TAB Ectopic Multiple Live Births                   Home Medications    Prior to Admission medications   Medication Sig Start Date End Date Taking? Authorizing Provider  albuterol (PROVENTIL HFA;VENTOLIN HFA) 108 (90 BASE) MCG/ACT inhaler Inhale 2 puffs into the lungs every 6 (six) hours as needed for shortness of breath.    Historical Provider, MD  butalbital-acetaminophen-caffeine (FIORICET) 50-325-40 MG tablet Take 1-2 tablets by mouth every 6 (six) hours as needed for headache. Patient not taking: Reported on 02/26/2016 06/14/15 06/13/16  Kristen N Ward, DO  Ibuprofen (ADVIL) 200 MG CAPS Take 2 capsules by mouth as needed.    Historical Provider, MD  metroNIDAZOLE (METROGEL) 0.75 % vaginal gel  09/04/15   Historical Provider, MD  metroNIDAZOLE  (METROGEL) 0.75 % vaginal gel INSERT ONE APPLICATORFUL VAGINALLY AT BEDTIME FOR  5  NIGHTS 03/26/16   Woodroe Mode, MD  ondansetron (ZOFRAN ODT) 4 MG disintegrating tablet Take 1 tablet (4 mg total) by mouth every 8 (eight) hours as needed for nausea or vomiting. Patient not taking: Reported on 02/26/2016 06/14/15   Kristen N Ward, DO  pseudoephedrine (SUDAFED) 30 MG tablet Take 1 tablet (30 mg total) by mouth every 4 (four) hours as needed for congestion. 06/06/16   Delos Haring, PA-C    Family History Family History  Problem Relation Age of Onset  . Thyroid disease Mother     large benign goiter    Social History Social History  Substance Use Topics  . Smoking status: Current Every Day Smoker    Packs/day: 0.00    Types: Cigarettes  . Smokeless tobacco: Never Used  . Alcohol use No     Allergies   Percocet [oxycodone-acetaminophen]   Review of Systems Review of Systems Review of Systems All other systems negative except as documented in the HPI. All pertinent positives and negatives as reviewed in the HPI.   Physical Exam Updated Vital Signs BP (!) 155/115   Pulse 73   Temp 99 F (37.2 C)   Ht 5\' 4"  (1.626 m)  Wt 63.7 kg   LMP 06/06/2016   SpO2 97%   BMI 24.12 kg/m   Physical Exam  Constitutional: She is oriented to person, place, and time. She appears well-developed and well-nourished. No distress.  HENT:  Head: Normocephalic and atraumatic.  Right Ear: Tympanic membrane and ear canal normal.  Left Ear: Tympanic membrane and ear canal normal.  Nose: Nose normal.  Mouth/Throat: Uvula is midline and oropharynx is clear and moist.  Eyes: Conjunctivae, EOM and lids are normal. Pupils are equal, round, and reactive to light.  Neck: Normal range of motion. Neck supple. No spinous process tenderness and no muscular tenderness present. No neck rigidity. Normal range of motion present. No Brudzinski's sign and no Kernig's sign noted.  Cardiovascular: Normal rate  and regular rhythm.   Pulmonary/Chest: Effort normal.  Abdominal: Soft.  Neurological: She is alert and oriented to person, place, and time.  Cranial nerves grossly intact on exam. Pt alert and oriented x 3 Upper and lower extremity strength is symmetrical and physiologic Normal muscular tone No facial droop Coordination intact, no limb ataxia,No pronator drift  Skin: Skin is warm and dry.  Nursing note and vitals reviewed.    ED Treatments / Results  Labs (all labs ordered are listed, but only abnormal results are displayed) Labs Reviewed - No data to display  EKG  EKG Interpretation None       Radiology No results found.  Procedures Procedures (including critical care time)  Medications Ordered in ED Medications  albuterol (PROVENTIL HFA;VENTOLIN HFA) 108 (90 Base) MCG/ACT inhaler 2 puff (2 puffs Inhalation Given 06/06/16 0509)  sodium chloride 0.9 % bolus 1,000 mL (0 mLs Intravenous Stopped 06/06/16 0528)  diphenhydrAMINE (BENADRYL) injection 25 mg (25 mg Intravenous Given 06/06/16 0417)  ketorolac (TORADOL) 30 MG/ML injection 30 mg (30 mg Intravenous Given 06/06/16 0417)  dexamethasone (DECADRON) injection 10 mg (10 mg Intravenous Given 06/06/16 0417)  ondansetron (ZOFRAN) injection 4 mg (4 mg Intravenous Given 06/06/16 0429)  traMADol (ULTRAM) tablet 50 mg (50 mg Oral Given 06/06/16 0509)     Initial Impression / Assessment and Plan / ED Course  I have reviewed the triage vital signs and the nursing notes.  Pertinent labs & imaging results that were available during my care of the patient were reviewed by me and considered in my medical decision making (see chart for details).  Clinical Course     Pt admits that her headache went from a 9/10 and improved to a 2-3 /10 pain.  Pt HA treated and improved while in ED.  Presentation is like pts typical HA and non concerning for [facility], ICH, Meningitis, or temporal arteritis. Pt is afebrile with no focal neuro  deficits, nuchal rigidity, or change in vision. Pt is to follow up with PCP to discuss prophylactic medication. Pt verbalizes understanding and is agreeable with plan to dc.    Final Clinical Impressions(s) / ED Diagnoses   Final diagnoses:  Sinus headache    New Prescriptions New Prescriptions   PSEUDOEPHEDRINE (SUDAFED) 30 MG TABLET    Take 1 tablet (30 mg total) by mouth every 4 (four) hours as needed for congestion.     Delos Haring, PA-C 06/06/16 [id number]    Everlene Balls, MD 06/06/16 1331

## 2016-07-17 ENCOUNTER — Encounter: Payer: Self-pay | Admitting: Diagnostic Radiology

## 2016-10-15 ENCOUNTER — Encounter (HOSPITAL_COMMUNITY): Payer: Self-pay | Admitting: *Deleted

## 2016-10-15 ENCOUNTER — Inpatient Hospital Stay (HOSPITAL_COMMUNITY)
Admission: AD | Admit: 2016-10-15 | Discharge: 2016-10-15 | Disposition: A | Discharge status: Home / Self Care | Payer: 59 | Source: Ambulatory Visit | Attending: Family Medicine | Admitting: Family Medicine

## 2016-10-15 DIAGNOSIS — F17210 Nicotine dependence, cigarettes, uncomplicated: Secondary | ICD-10-CM | POA: Insufficient documentation

## 2016-10-15 DIAGNOSIS — R300 Dysuria: Secondary | ICD-10-CM | POA: Diagnosis present

## 2016-10-15 DIAGNOSIS — Z885 Allergy status to narcotic agent status: Secondary | ICD-10-CM | POA: Diagnosis not present

## 2016-10-15 DIAGNOSIS — N3001 Acute cystitis with hematuria: Secondary | ICD-10-CM | POA: Diagnosis not present

## 2016-10-15 DIAGNOSIS — Z79899 Other long term (current) drug therapy: Secondary | ICD-10-CM | POA: Diagnosis not present

## 2016-10-15 HISTORY — DX: Essential (primary) hypertension: I10

## 2016-10-15 LAB — URINALYSIS, ROUTINE W REFLEX MICROSCOPIC
Bilirubin Urine: NEGATIVE
Glucose, UA: NEGATIVE mg/dL
Ketones, ur: NEGATIVE mg/dL
Nitrite: NEGATIVE
Protein, ur: 100 mg/dL — AB
Specific Gravity, Urine: 1.018 (ref 1.005–1.030)
pH: 5 (ref 5.0–8.0)

## 2016-10-15 LAB — POCT PREGNANCY, URINE: Preg Test, Ur: NEGATIVE

## 2016-10-15 MED ORDER — SULFAMETHOXAZOLE-TRIMETHOPRIM 800-160 MG PO TABS: 1.0000 | ORAL_TABLET | Freq: Two times a day (BID) | ORAL | 0 refills | Status: DC

## 2016-10-15 MED ORDER — PHENAZOPYRIDINE HCL 200 MG PO TABS: 200.0000 mg | ORAL_TABLET | Freq: Three times a day (TID) | ORAL | 0 refills | Status: DC

## 2016-10-15 NOTE — MAU Note (Signed)
1500- pt was given another urine cup and some water, not enough urine to test

## 2016-10-15 NOTE — Discharge Instructions (Signed)

## 2016-10-15 NOTE — MAU Note (Signed)
Started lat night.  Keep having to use the bathroom and it hurts really bad when she pees, lots of pressure

## 2016-10-15 NOTE — MAU Provider Note (Signed)
History     CSN: [id number]  Arrival date and time: 10/15/16 1436   First Provider Initiated Contact with Patient 10/15/16 1636      Chief Complaint  Patient presents with  . Dysuria   Carol Mack is a 46 y.o. Non pregnant female who presents with dysuria. Symptoms began last night. Denies hx of UTI. Sexually active w/1 partner x 14 years & denies concern nor history of STD. Denies fever/chills, n/v, or flank pain.    Dysuria   This is a new problem. The current episode started yesterday. The problem occurs every urination. The problem has been gradually worsening. The quality of the pain is described as burning. The pain is at a severity of 7/10. There has been no fever. She is sexually active. There is no history of pyelonephritis. Associated symptoms include frequency, hematuria, hesitancy and urgency. Pertinent negatives include no chills, discharge, flank pain, nausea, possible pregnancy or vomiting. She has tried nothing for the symptoms. There is no history of kidney stones or recurrent UTIs.   Past Medical History:  Diagnosis Date  . Asthma   . Bartholin cyst   . Hernia, inguinal   . Hernia, inguinal   . Hypertension     Past Surgical History:  Procedure Laterality Date  . IR GENERIC HISTORICAL  07/27/2014   IR RADIOLOGIST EVAL & MGMT 07/27/2014 Markus Daft, MD [facility] INTERV RAD  . NO PAST SURGERIES      Family History  Problem Relation Age of Onset  . Thyroid disease Mother     large benign goiter    Social History  Substance Use Topics  . Smoking status: Current Every Day Smoker    Packs/day: 0.00    Types: Cigarettes  . Smokeless tobacco: Never Used  . Alcohol use No    Allergies:  Allergies  Allergen Reactions  . Percocet [Oxycodone-Acetaminophen] Other (See Comments)    jittery    Prescriptions Prior to Admission  Medication Sig Dispense Refill Last Dose  . albuterol (PROVENTIL HFA;VENTOLIN HFA) 108 (90 BASE) MCG/ACT inhaler Inhale 2 puffs into the  lungs every 6 (six) hours as needed for shortness of breath.   Taking  . Ibuprofen (ADVIL) 200 MG CAPS Take 2 capsules by mouth as needed.   Taking  . metroNIDAZOLE (METROGEL) 0.75 % vaginal gel    Not Taking  . metroNIDAZOLE (METROGEL) 0.75 % vaginal gel INSERT ONE APPLICATORFUL VAGINALLY AT BEDTIME FOR  5  NIGHTS 70 g 0   . ondansetron (ZOFRAN ODT) 4 MG disintegrating tablet Take 1 tablet (4 mg total) by mouth every 8 (eight) hours as needed for nausea or vomiting. (Patient not taking: Reported on 02/26/2016) 20 tablet 0 Not Taking  . pseudoephedrine (SUDAFED) 30 MG tablet Take 1 tablet (30 mg total) by mouth every 4 (four) hours as needed for congestion. 20 tablet 0     Review of Systems  Constitutional: Negative for chills and fever.  Respiratory: Negative for shortness of breath.   Cardiovascular: Negative for chest pain.  Gastrointestinal: Negative.  Negative for nausea and vomiting.  Genitourinary: Positive for dysuria, frequency, hematuria, hesitancy and urgency. Negative for dyspareunia, flank pain, vaginal bleeding and vaginal discharge.  Neurological: Negative for headaches.   Physical Exam   Blood pressure (!) 150/90, pulse 90, temperature 98.2 F (36.8 C), resp. rate 18, weight 142 lb 12 oz (64.8 kg), last menstrual period 09/18/2016, SpO2 100 %.  Physical Exam  Nursing note and vitals reviewed. Constitutional: She is oriented to  person, place, and time. She appears well-developed and well-nourished. No distress.  HENT:  Head: Normocephalic and atraumatic.  Eyes: Conjunctivae are normal. Right eye exhibits no discharge. Left eye exhibits no discharge. No scleral icterus.  Neck: Normal range of motion.  Cardiovascular: Normal rate, regular rhythm and normal heart sounds.   No murmur heard. Respiratory: Effort normal and breath sounds normal. No respiratory distress. She has no wheezes.  GI: Soft. Bowel sounds are normal. There is tenderness in the suprapubic area. There is  no rigidity, no guarding and no CVA tenderness.  Neurological: She is alert and oriented to person, place, and time.  Skin: Skin is warm and dry. She is not diaphoretic.  Psychiatric: She has a normal mood and affect. Her behavior is normal. Judgment and thought content normal.    MAU Course  Procedures Results for orders placed or performed during the hospital encounter of 10/15/16 (from the past 24 hour(s))  Pregnancy, urine POC     Status: None   Collection Time: 10/15/16  3:06 PM  Result Value Ref Range   Preg Test, Ur NEGATIVE NEGATIVE  Urinalysis, Routine w reflex microscopic     Status: Abnormal   Collection Time: 10/15/16  3:10 PM  Result Value Ref Range   Color, Urine YELLOW YELLOW   APPearance CLOUDY (A) CLEAR   Specific Gravity, Urine 1.018 1.005 - 1.030   pH 5.0 5.0 - 8.0   Glucose, UA NEGATIVE NEGATIVE mg/dL   Hgb urine dipstick LARGE (A) NEGATIVE   Bilirubin Urine NEGATIVE NEGATIVE   Ketones, ur NEGATIVE NEGATIVE mg/dL   Protein, ur 100 (A) NEGATIVE mg/dL   Nitrite NEGATIVE NEGATIVE   Leukocytes, UA LARGE (A) NEGATIVE   RBC / HPF TOO NUMEROUS TO COUNT 0 - 5 RBC/hpf   WBC, UA TOO NUMEROUS TO COUNT 0 - 5 WBC/hpf   Bacteria, UA FEW (A) NONE SEEN   Squamous Epithelial / LPF 0-5 (A) NONE SEEN   Mucous PRESENT     MDM UPT negative VSS -- reports hx of elevated BP, no tx, receives regular care from PCP. Denies h/a, CP, or SOB.  U/a suspicious for UTI - will tx d/t symptoms & send for cx.   Assessment and Plan  A; 1. Acute cystitis with hematuria   2. Dysuria    P: Discharge home Rx septra & pyridium Discussed reasons to return to MAU Keep follow up appointment with OB/PCP  Urine culture pending   [name] 10/15/2016, 4:36 PM

## 2016-12-13 ENCOUNTER — Encounter: Payer: Self-pay | Admitting: Endocrinology

## 2016-12-13 ENCOUNTER — Ambulatory Visit (INDEPENDENT_AMBULATORY_CARE_PROVIDER_SITE_OTHER): Payer: 59 | Admitting: Endocrinology

## 2016-12-13 VITALS — BP 128/82 | HR 70 | Ht 64.0 in | Wt 140.0 lb

## 2016-12-13 DIAGNOSIS — E0590 Thyrotoxicosis, unspecified without thyrotoxic crisis or storm: Secondary | ICD-10-CM | POA: Diagnosis not present

## 2016-12-13 NOTE — Progress Notes (Signed)
Subjective:    Patient ID: Carol Mack, female    DOB: 07-29-1970, 46 y.o.   MRN: [id number]  HPI Pt returns for f/u of hyperthyroidism (due to large multinodular goiter; risk of malignancy was low, but radiol advised bx prior to RAI rx; right lobe bx in 2017 was beth Cat 1; she had RAI in Sept of 2017).  She says she continues to notice the goiter. pt states she feels no different, and well in general.  She says if TSH is low, she does not want to take another dose of RAI Past Medical History:  Diagnosis Date  . Asthma   . Bartholin cyst   . Hernia, inguinal   . Hernia, inguinal   . Hypertension     Past Surgical History:  Procedure Laterality Date  . IR GENERIC HISTORICAL  07/27/2014   IR RADIOLOGIST EVAL & MGMT 07/27/2014 Markus Daft, MD [facility] INTERV RAD  . NO PAST SURGERIES      Social History   Social History  . Marital status: Single    Spouse name: N/A  . Number of children: N/A  . Years of education: N/A   Occupational History  . Not on file.   Social History Main Topics  . Smoking status: Current Every Day Smoker    Packs/day: 0.00    Types: Cigarettes  . Smokeless tobacco: Never Used  . Alcohol use No  . Drug use: No  . Sexual activity: Not on file   Other Topics Concern  . Not on file   Social History Narrative  . No narrative on file    Current Outpatient Prescriptions on File Prior to Visit  Medication Sig Dispense Refill  . albuterol (PROVENTIL HFA;VENTOLIN HFA) 108 (90 BASE) MCG/ACT inhaler Inhale 2 puffs into the lungs every 6 (six) hours as needed for shortness of breath.    . Ibuprofen (ADVIL) 200 MG CAPS Take 2 capsules by mouth as needed.    . metroNIDAZOLE (METROGEL) 0.75 % vaginal gel INSERT ONE APPLICATORFUL VAGINALLY AT BEDTIME FOR  5  NIGHTS 70 g 0  . metroNIDAZOLE (METROGEL) 0.75 % vaginal gel     . ondansetron (ZOFRAN ODT) 4 MG disintegrating tablet Take 1 tablet (4 mg total) by mouth every 8 (eight) hours as needed for nausea or  vomiting. (Patient not taking: Reported on 12/13/2016) 20 tablet 0  . phenazopyridine (PYRIDIUM) 200 MG tablet Take 1 tablet (200 mg total) by mouth 3 (three) times daily. (Patient not taking: Reported on 12/13/2016) 6 tablet 0  . pseudoephedrine (SUDAFED) 30 MG tablet Take 1 tablet (30 mg total) by mouth every 4 (four) hours as needed for congestion. (Patient not taking: Reported on 12/13/2016) 20 tablet 0  . sulfamethoxazole-trimethoprim (BACTRIM DS,SEPTRA DS) 800-160 MG tablet Take 1 tablet by mouth 2 (two) times daily. (Patient not taking: Reported on 12/13/2016) 10 tablet 0  . [DISCONTINUED] fluticasone (FLONASE) 50 MCG/ACT nasal spray Place 2 sprays into both nostrils daily. (Patient not taking: Reported on 06/14/2015) 16 g 2   No current facility-administered medications on file prior to visit.     Allergies  Allergen Reactions  . Percocet [Oxycodone-Acetaminophen] Other (See Comments)    jittery    Family History  Problem Relation Age of Onset  . Thyroid disease Mother        large benign goiter    BP 128/82   Pulse 70   Ht 5\' 4"  (1.626 m)   Wt 140 lb (63.5 kg)   SpO2  96%   BMI 24.03 kg/m   Review of Systems She feels there is no change in the size of the goiter.      Objective:   Physical Exam VITAL SIGNS:  See vs page GENERAL: no distress NECK: supple, thyroid is 5 times normal size (R>L), with irregular surface.      Assessment & Plan:  Hyperthyroidism: overdue for recheck. Multinodular goiter: clinically unchanged.  Patient Instructions  blood tests are requested for you today.  We'll let you know about the results. Please come back for a follow-up appointment in 3 months

## 2016-12-13 NOTE — Patient Instructions (Signed)
blood tests are requested for you today.  We'll let you know about the results.   Please come back for a follow-up appointment in 3 months.   

## 2016-12-16 ENCOUNTER — Other Ambulatory Visit (INDEPENDENT_AMBULATORY_CARE_PROVIDER_SITE_OTHER): Payer: 59

## 2016-12-16 DIAGNOSIS — E0590 Thyrotoxicosis, unspecified without thyrotoxic crisis or storm: Secondary | ICD-10-CM | POA: Diagnosis not present

## 2016-12-16 LAB — T4, FREE: Free T4: 0.92 ng/dL (ref 0.60–1.60)

## 2016-12-16 LAB — TSH: TSH: 1.53 u[IU]/mL (ref 0.35–4.50)

## 2016-12-16 NOTE — Progress Notes (Signed)
Left message on voicemail to call office.  

## 2016-12-18 ENCOUNTER — Telehealth: Payer: Self-pay

## 2016-12-18 NOTE — Telephone Encounter (Signed)
-----   Message from Renato Shin, MD sent at 12/16/2016  4:10 PM EDT ----- please call patient: Normal No treatment is needed now. Please come back for a follow-up appointment in 6 months

## 2016-12-18 NOTE — Telephone Encounter (Signed)
LVM, gave lab results. Gave call back number if any questions or concerns.  

## 2016-12-26 ENCOUNTER — Telehealth: Payer: Self-pay

## 2016-12-26 NOTE — Telephone Encounter (Signed)
Patient called to advise of normal lab results. Patient understood.

## 2017-03-19 ENCOUNTER — Ambulatory Visit: Payer: 59 | Admitting: Endocrinology

## 2017-03-19 DIAGNOSIS — Z0289 Encounter for other administrative examinations: Secondary | ICD-10-CM

## 2017-06-02 ENCOUNTER — Other Ambulatory Visit: Payer: Self-pay | Admitting: Obstetrics and Gynecology

## 2017-06-02 DIAGNOSIS — Z1231 Encounter for screening mammogram for malignant neoplasm of breast: Secondary | ICD-10-CM

## 2017-07-10 ENCOUNTER — Ambulatory Visit (HOSPITAL_COMMUNITY): Payer: Self-pay

## 2017-08-06 ENCOUNTER — Inpatient Hospital Stay (HOSPITAL_COMMUNITY)
Admission: AD | Admit: 2017-08-06 | Discharge: 2017-08-07 | Disposition: A | Discharge status: Home / Self Care | Payer: Self-pay | Source: Ambulatory Visit | Attending: Obstetrics & Gynecology | Admitting: Obstetrics & Gynecology

## 2017-08-06 DIAGNOSIS — Z885 Allergy status to narcotic agent status: Secondary | ICD-10-CM | POA: Insufficient documentation

## 2017-08-06 DIAGNOSIS — R030 Elevated blood-pressure reading, without diagnosis of hypertension: Secondary | ICD-10-CM

## 2017-08-06 DIAGNOSIS — N898 Other specified noninflammatory disorders of vagina: Secondary | ICD-10-CM

## 2017-08-06 DIAGNOSIS — B9689 Other specified bacterial agents as the cause of diseases classified elsewhere: Secondary | ICD-10-CM

## 2017-08-06 DIAGNOSIS — F17210 Nicotine dependence, cigarettes, uncomplicated: Secondary | ICD-10-CM | POA: Insufficient documentation

## 2017-08-06 DIAGNOSIS — Z3202 Encounter for pregnancy test, result negative: Secondary | ICD-10-CM | POA: Insufficient documentation

## 2017-08-06 DIAGNOSIS — N760 Acute vaginitis: Secondary | ICD-10-CM | POA: Insufficient documentation

## 2017-08-06 LAB — POCT PREGNANCY, URINE: Preg Test, Ur: NEGATIVE

## 2017-08-06 NOTE — MAU Note (Signed)
Foul vaginal odor for past 5 days.  Also having white discharge.  States she has had BV before and feels like that is what it is.

## 2017-08-07 ENCOUNTER — Encounter (HOSPITAL_COMMUNITY): Payer: Self-pay | Admitting: *Deleted

## 2017-08-07 DIAGNOSIS — N898 Other specified noninflammatory disorders of vagina: Secondary | ICD-10-CM

## 2017-08-07 LAB — URINALYSIS, ROUTINE W REFLEX MICROSCOPIC
Bilirubin Urine: NEGATIVE
Glucose, UA: NEGATIVE mg/dL
Hgb urine dipstick: NEGATIVE
Ketones, ur: NEGATIVE mg/dL
Leukocytes, UA: NEGATIVE
Nitrite: NEGATIVE
Protein, ur: NEGATIVE mg/dL
Specific Gravity, Urine: 1.031 — ABNORMAL HIGH (ref 1.005–1.030)
pH: 5 (ref 5.0–8.0)

## 2017-08-07 LAB — WET PREP, GENITAL
Sperm: NONE SEEN
Trich, Wet Prep: NONE SEEN
Yeast Wet Prep HPF POC: NONE SEEN

## 2017-08-07 LAB — GC/CHLAMYDIA PROBE AMP (~~LOC~~) NOT AT ARMC
Chlamydia: NEGATIVE
Neisseria Gonorrhea: NEGATIVE

## 2017-08-07 MED ORDER — METRONIDAZOLE 500 MG PO TABS: 500.0000 mg | ORAL_TABLET | Freq: Two times a day (BID) | ORAL | 0 refills | Status: AC

## 2017-08-07 NOTE — MAU Provider Note (Signed)
History     CSN: [id number]  Arrival date and time: 08/06/17 2328   None     Chief Complaint  Patient presents with  . Vaginal Discharge   HPI Carol Mack is a 47 y.o. [id number] female who presents to the MAU for vaginal discharge. patient states she has been having vaginal discharge for the last 5 days. There is associated odor and increased white vaginal discharge. Patient believes she may have another bacterial infection as she has had this before. She denies fever, n/v/d, abdominal pain, dysuria. States she has not had intercourse in over 4 months. Does not believe she is pregnant.    Past Medical History:  Diagnosis Date  . Asthma   . Bartholin cyst   . Hernia, inguinal   . Hernia, inguinal   . Hypertension     Past Surgical History:  Procedure Laterality Date  . IR GENERIC HISTORICAL  07/27/2014   IR RADIOLOGIST EVAL & MGMT 07/27/2014 Markus Daft, MD [facility] INTERV RAD  . NO PAST SURGERIES      Family History  Problem Relation Age of Onset  . Thyroid disease Mother        large benign goiter    Social History   Tobacco Use  . Smoking status: Current Every Day Smoker    Packs/day: 1.00    Types: Cigarettes  . Smokeless tobacco: Never Used  Substance Use Topics  . Alcohol use: No  . Drug use: No    Allergies:  Allergies  Allergen Reactions  . Percocet [Oxycodone-Acetaminophen] Other (See Comments)    jittery    Medications Prior to Admission  Medication Sig Dispense Refill Last Dose  . albuterol (PROVENTIL HFA;VENTOLIN HFA) 108 (90 BASE) MCG/ACT inhaler Inhale 2 puffs into the lungs every 6 (six) hours as needed for shortness of breath.   Past Month at Unknown time  . Ibuprofen (ADVIL) 200 MG CAPS Take 2 capsules by mouth as needed.   08/06/2017 at Unknown time  . metroNIDAZOLE (METROGEL) 0.75 % vaginal gel    Not Taking  . metroNIDAZOLE (METROGEL) 0.75 % vaginal gel INSERT ONE APPLICATORFUL VAGINALLY AT BEDTIME FOR  5  NIGHTS 70 g 0 More than a month at  Unknown time  . ondansetron (ZOFRAN ODT) 4 MG disintegrating tablet Take 1 tablet (4 mg total) by mouth every 8 (eight) hours as needed for nausea or vomiting. (Patient not taking: Reported on 12/13/2016) 20 tablet 0 Not Taking  . phenazopyridine (PYRIDIUM) 200 MG tablet Take 1 tablet (200 mg total) by mouth 3 (three) times daily. (Patient not taking: Reported on 12/13/2016) 6 tablet 0 Not Taking  . pseudoephedrine (SUDAFED) 30 MG tablet Take 1 tablet (30 mg total) by mouth every 4 (four) hours as needed for congestion. (Patient not taking: Reported on 12/13/2016) 20 tablet 0 Not Taking  . sulfamethoxazole-trimethoprim (BACTRIM DS,SEPTRA DS) 800-160 MG tablet Take 1 tablet by mouth 2 (two) times daily. (Patient not taking: Reported on 12/13/2016) 10 tablet 0 Not Taking    Review of Systems  All systems reviewed and are negative for acute change except as noted in the HPI.  Physical Exam   Blood pressure (!) 159/96, pulse 80, temperature 98.1 F (36.7 C), resp. rate 17.  Physical Exam  Nursing note and vitals reviewed. Constitutional: She is oriented to person, place, and time. She appears well-developed and well-nourished. No distress.  HENT:  Head: Normocephalic and atraumatic.  Eyes: Conjunctivae and EOM are normal.  Neck: Normal range of motion.  Neck supple.  Cardiovascular: Normal rate and intact distal pulses.  Respiratory: Effort normal.  GI: Soft. She exhibits no distension. There is no tenderness. There is no guarding.  Genitourinary: Vaginal discharge found.  Musculoskeletal: Normal range of motion.  Neurological: She is alert and oriented to person, place, and time.  Skin: Skin is warm and dry.  Psychiatric: She has a normal mood and affect.   Results for orders placed or performed during the hospital encounter of 08/06/17  Wet prep, genital  Result Value Ref Range   Yeast Wet Prep HPF POC NONE SEEN NONE SEEN   Trich, Wet Prep NONE SEEN NONE SEEN   Clue Cells Wet Prep HPF POC  PRESENT (A) NONE SEEN   WBC, Wet Prep HPF POC FEW (A) NONE SEEN   Sperm NONE SEEN   Urinalysis, Routine w reflex microscopic  Result Value Ref Range   Color, Urine YELLOW YELLOW   APPearance CLEAR CLEAR   Specific Gravity, Urine 1.031 (H) 1.005 - 1.030   pH 5.0 5.0 - 8.0   Glucose, UA NEGATIVE NEGATIVE mg/dL   Hgb urine dipstick NEGATIVE NEGATIVE   Bilirubin Urine NEGATIVE NEGATIVE   Ketones, ur NEGATIVE NEGATIVE mg/dL   Protein, ur NEGATIVE NEGATIVE mg/dL   Nitrite NEGATIVE NEGATIVE   Leukocytes, UA NEGATIVE NEGATIVE  Pregnancy, urine POC  Result Value Ref Range   Preg Test, Ur NEGATIVE NEGATIVE    MAU Course  Procedures  MDM Wet prep consitent with BV Upreg negative UA unremarkable Elevated BPs in MAU  Assessment and Plan  Vaginal discharge  BV (bacterial vaginosis)  Elevated blood pressure reading  -Discharge home in stable condition -Rx for Flagyl given -Discussed alternative methods to help prevent BV from recurring such as probiotic and boric acid -Return precautions given -Patient to follow-up with PCP for elevated BPs -handout provided   Luiz Blare, DO 08/07/2017, 12:21 AM

## 2017-08-07 NOTE — Discharge Instructions (Signed)
Boric acid vaginal suppositories Probiotic   Bacterial Vaginosis Bacterial vaginosis is an infection of the vagina. It happens when too many germs (bacteria) grow in the vagina. This infection puts you at risk for infections from sex (STIs). Treating this infection can lower your risk for some STIs. You should also treat this if you are pregnant. It can cause your baby to be born early. Follow these instructions at home: Medicines  Take over-the-counter and prescription medicines only as told by your doctor.  Take or use your antibiotic medicine as told by your doctor. Do not stop taking or using it even if you start to feel better. General instructions  If you your sexual partner is a woman, tell her that you have this infection. She needs to get treatment if she has symptoms. If you have a female partner, he does not need to be treated.  During treatment: ? Avoid sex. ? Do not douche. ? Avoid alcohol as told. ? Avoid breastfeeding as told.  Drink enough fluid to keep your pee (urine) clear or pale yellow.  Keep your vagina and butt (rectum) clean. ? Wash the area with warm water every day. ? Wipe from front to back after you use the toilet.  Keep all follow-up visits as told by your doctor. This is important. Preventing this condition  Do not douche.  Use only warm water to wash around your vagina.  Use protection when you have sex. This includes: ? Latex condoms. ? Dental dams.  Limit how many people you have sex with. It is best to only have sex with the same person (be monogamous).  Get tested for STIs. Have your partner get tested.  Wear underwear that is cotton or lined with cotton.  Avoid tight pants and pantyhose. This is most important in summer.  Do not use any products that have nicotine or tobacco in them. These include cigarettes and e-cigarettes. If you need help quitting, ask your doctor.  Do not use illegal drugs.  Limit how much alcohol you  drink. Contact a doctor if:  Your symptoms do not get better, even after you are treated.  You have more discharge or pain when you pee (urinate).  You have a fever.  You have pain in your belly (abdomen).  You have pain with sex.  Your bleed from your vagina between periods. Summary  This infection happens when too many germs (bacteria) grow in the vagina.  Treating this condition can lower your risk for some infections from sex (STIs).  You should also treat this if you are pregnant. It can cause early (premature) birth.  Do not stop taking or using your antibiotic medicine even if you start to feel better. This information is not intended to replace advice given to you by your health care provider. Make sure you discuss any questions you have with your health care provider. Document Released: 04/09/2008 Document Revised: 03/16/2016 Document Reviewed: 03/16/2016 Elsevier Interactive Patient Education  2017 [employer].

## 2017-08-11 ENCOUNTER — Telehealth (HOSPITAL_COMMUNITY): Payer: Self-pay

## 2017-08-18 NOTE — Telephone Encounter (Signed)
Phoned patient to attempt to schedule with [facility] clinic. No answer or return call.

## 2017-12-15 ENCOUNTER — Encounter (HOSPITAL_COMMUNITY): Payer: Self-pay | Admitting: *Deleted

## 2017-12-15 ENCOUNTER — Inpatient Hospital Stay (HOSPITAL_COMMUNITY)
Admission: AD | Admit: 2017-12-15 | Discharge: 2017-12-15 | Disposition: A | Discharge status: Home / Self Care | Payer: Self-pay | Source: Ambulatory Visit | Attending: Obstetrics and Gynecology | Admitting: Obstetrics and Gynecology

## 2017-12-15 DIAGNOSIS — J069 Acute upper respiratory infection, unspecified: Secondary | ICD-10-CM | POA: Insufficient documentation

## 2017-12-15 DIAGNOSIS — N760 Acute vaginitis: Secondary | ICD-10-CM | POA: Insufficient documentation

## 2017-12-15 DIAGNOSIS — F17210 Nicotine dependence, cigarettes, uncomplicated: Secondary | ICD-10-CM | POA: Insufficient documentation

## 2017-12-15 DIAGNOSIS — B9689 Other specified bacterial agents as the cause of diseases classified elsewhere: Secondary | ICD-10-CM

## 2017-12-15 DIAGNOSIS — J3489 Other specified disorders of nose and nasal sinuses: Secondary | ICD-10-CM

## 2017-12-15 DIAGNOSIS — Z79899 Other long term (current) drug therapy: Secondary | ICD-10-CM | POA: Insufficient documentation

## 2017-12-15 LAB — URINALYSIS, ROUTINE W REFLEX MICROSCOPIC
Bacteria, UA: NONE SEEN
Bilirubin Urine: NEGATIVE
Glucose, UA: NEGATIVE mg/dL
Ketones, ur: NEGATIVE mg/dL
Leukocytes, UA: NEGATIVE
Nitrite: NEGATIVE
Protein, ur: NEGATIVE mg/dL
Specific Gravity, Urine: 1.026 (ref 1.005–1.030)
pH: 5 (ref 5.0–8.0)

## 2017-12-15 LAB — WET PREP, GENITAL
Sperm: NONE SEEN
Trich, Wet Prep: NONE SEEN
Yeast Wet Prep HPF POC: NONE SEEN

## 2017-12-15 LAB — POCT PREGNANCY, URINE: Preg Test, Ur: NEGATIVE

## 2017-12-15 MED ORDER — METRONIDAZOLE 0.75 % VA GEL: Freq: Every day | VAGINAL | 3 refills | Status: AC

## 2017-12-15 NOTE — Discharge Instructions (Signed)
Some natural remedies/prevention to try for bacterial vaginosis: Take a probiotic tablet/capsule every day for at least 1-2 months.   Whenever you have symptoms, use boric acid suppositories vaginally every night for a week.   Do not use scented soaps/perfumes in the vaginal area and wear breathable cotton underwear and do not wear tight restrictive clothing. Use condoms during intercourse to prevent semen changing your pH balance

## 2017-12-15 NOTE — MAU Note (Addendum)
PT SAYS  SHE HAS BV- HAS VAG D/C- STARTED  MAY.  SHE WAS HERE IN April  FOR BV-  TREATED - TOOK MEDS. .   NO BIRTH CONTROL.  LAST SEX-    MAY.     SINUS INFECTION- FEELS  FACE AND EYES  ARE SWOLLEN .    NO FEVER AT HOME  HAS BEEN TAKING ADVIL AND SINUS MED - OTC

## 2017-12-15 NOTE — MAU Provider Note (Signed)
Chief Complaint: Vaginal Discharge   None     SUBJECTIVE HPI: Carol Mack is a 47 y.o. G2P2002 who presents to maternity admissions reporting vaginal discharge with odor and a sinus infection starting yesterday.  She reports vaginal discharge c/w previous bacterial vaginosis, which has been recurrent for several months.  She has tried Flagyl and the infection improves but returns right away.  She desires to try the Metrogel because the pills are difficult to tolerate.  She reports that yesterday she started having stuffy nose and pressure in her face and behind her eyes. It is associated with watery and redness in her eyes. She had to leave work today because of her symptoms.  There are no other associated symptoms. She has not tried any other treatments.  She denies fever/chills, sore throat, or body aches.     HPI  Past Medical History:  Diagnosis Date  . Asthma   . Bartholin cyst   . Hernia, inguinal   . Hernia, inguinal   . Hypertension    Past Surgical History:  Procedure Laterality Date  . IR GENERIC HISTORICAL  07/27/2014   IR RADIOLOGIST EVAL & MGMT 07/27/2014 Markus Daft, MD [facility] INTERV RAD  . NO PAST SURGERIES     Social History   Socioeconomic History  . Marital status: Single    Spouse name: Not on file  . Number of children: Not on file  . Years of education: Not on file  . Highest education level: Not on file  Occupational History  . Not on file  Social Needs  . Financial resource strain: Not on file  . Food insecurity:    Worry: Not on file    Inability: Not on file  . Transportation needs:    Medical: Not on file    Non-medical: Not on file  Tobacco Use  . Smoking status: Current Every Day Smoker    Packs/day: 1.00    Types: Cigarettes  . Smokeless tobacco: Never Used  Substance and Sexual Activity  . Alcohol use: No  . Drug use: No  . Sexual activity: Not on file  Lifestyle  . Physical activity:    Days per week: Not on file    Minutes per  session: Not on file  . Stress: Not on file  Relationships  . Social connections:    Talks on phone: Not on file    Gets together: Not on file    Attends religious service: Not on file    Active member of club or organization: Not on file    Attends meetings of clubs or organizations: Not on file    Relationship status: Not on file  . Intimate partner violence:    Fear of current or ex partner: Not on file    Emotionally abused: Not on file    Physically abused: Not on file    Forced sexual activity: Not on file  Other Topics Concern  . Not on file  Social History Narrative  . Not on file   No current facility-administered medications on file prior to encounter.    Current Outpatient Medications on File Prior to Encounter  Medication Sig Dispense Refill  . albuterol (PROVENTIL HFA;VENTOLIN HFA) 108 (90 BASE) MCG/ACT inhaler Inhale 2 puffs into the lungs every 6 (six) hours as needed for shortness of breath.    . Ibuprofen (ADVIL) 200 MG CAPS Take 2 capsules by mouth as needed.     Allergies  Allergen Reactions  . Percocet [Oxycodone-Acetaminophen] Other (  See Comments)    jittery    ROS:  Review of Systems  Constitutional: Negative for chills, fatigue and fever.  HENT: Positive for congestion, sinus pressure and sinus pain.   Eyes: Positive for pain and discharge.  Respiratory: Negative for shortness of breath.   Cardiovascular: Negative for chest pain.  Gastrointestinal: Negative for nausea and vomiting.  Genitourinary: Negative for difficulty urinating, dysuria, flank pain, pelvic pain, vaginal bleeding, vaginal discharge and vaginal pain.  Neurological: Negative for dizziness and headaches.  Psychiatric/Behavioral: Negative.      I have reviewed patient's Past Medical Hx, Surgical Hx, Family Hx, Social Hx, medications and allergies.   Physical Exam   Patient Vitals for the past 24 hrs:  BP Temp Temp src Pulse Resp Height Weight  12/15/17 2159 (!) 135/97 98.7 F  (37.1 C) Oral 77 20 5\' 4"  (1.626 m) 146 lb 8 oz (66.5 kg)   Constitutional: Well-developed, well-nourished female in no acute distress.  Cardiovascular: normal rate Respiratory: normal effort GI: Abd soft, non-tender. Pos BS x 4 MS: Extremities nontender, no edema, normal ROM Neurologic: Alert and oriented x 4.  GU: Neg CVAT.    LAB RESULTS Results for orders placed or performed during the hospital encounter of 12/15/17 (from the past 24 hour(s))  Urinalysis, Routine w reflex microscopic     Status: Abnormal   Collection Time: 12/15/17 10:05 PM  Result Value Ref Range   Color, Urine YELLOW YELLOW   APPearance HAZY (A) CLEAR   Specific Gravity, Urine 1.026 1.005 - 1.030   pH 5.0 5.0 - 8.0   Glucose, UA NEGATIVE NEGATIVE mg/dL   Hgb urine dipstick SMALL (A) NEGATIVE   Bilirubin Urine NEGATIVE NEGATIVE   Ketones, ur NEGATIVE NEGATIVE mg/dL   Protein, ur NEGATIVE NEGATIVE mg/dL   Nitrite NEGATIVE NEGATIVE   Leukocytes, UA NEGATIVE NEGATIVE   RBC / HPF 0-5 0 - 5 RBC/hpf   WBC, UA 6-10 0 - 5 WBC/hpf   Bacteria, UA NONE SEEN NONE SEEN   Squamous Epithelial / LPF 0-5 0 - 5   Mucus PRESENT   Wet prep, genital     Status: Abnormal   Collection Time: 12/15/17 10:05 PM  Result Value Ref Range   Yeast Wet Prep HPF POC NONE SEEN NONE SEEN   Trich, Wet Prep NONE SEEN NONE SEEN   Clue Cells Wet Prep HPF POC PRESENT (A) NONE SEEN   WBC, Wet Prep HPF POC FEW (A) NONE SEEN   Sperm NONE SEEN   Pregnancy, urine POC     Status: None   Collection Time: 12/15/17 10:09 PM  Result Value Ref Range   Preg Test, Ur NEGATIVE NEGATIVE       IMAGING No results found.  MAU Management/MDM: Pt with acute sinus pain/URI. In the absence of fever or long duration, no indication for abx for URI today.  Wet prep and symptoms c/w recurrent BV Treat with Metrogel Q HS x 5 nights Discussed prevention of BV with pt including probiotics, decreased use of soaps/perfumes, use of boric acid suppositories,  and use of condoms for intercourse F/U with Ob/Gyn provider for routine gyn care, and with primary care or urgent care if respiratory symptoms persist or worsen Return to MAU for emergencies Pt discharged with strict return precautions.  ASSESSMENT 1. Upper respiratory infection, acute   2. BV (bacterial vaginosis)   3. Frontal sinus pain     PLAN Discharge home Allergies as of 12/15/2017      Reactions  Percocet [oxycodone-acetaminophen] Other (See Comments)   jittery      Medication List    TAKE these medications   ADVIL 200 MG Caps Generic drug:  Ibuprofen Take 2 capsules by mouth as needed.   albuterol 108 (90 Base) MCG/ACT inhaler Commonly known as:  PROVENTIL HFA;VENTOLIN HFA Inhale 2 puffs into the lungs every 6 (six) hours as needed for shortness of breath.   metroNIDAZOLE 0.75 % vaginal gel Commonly known as:  [place] vaginally at bedtime for 5 days.      Follow-up [facility] Follow up.   Specialty:  Obstetrics and Gynecology Why:  Or your gyn provider as needed for routine gyn care Contact information: [address] [place]       Antony Blackbird, MD Follow up.   Specialty:  Family Medicine Why:  Or Urgent Care if your respiratory symtoms continue or worsen Contact information: [address] [place] [address] [phone]           Fatima Blank Certified Nurse-Midwife 12/16/2017  4:40 AM

## 2017-12-16 LAB — GC/CHLAMYDIA PROBE AMP (~~LOC~~) NOT AT ARMC
Chlamydia: NEGATIVE
Neisseria Gonorrhea: NEGATIVE

## 2018-03-02 ENCOUNTER — Other Ambulatory Visit: Payer: Self-pay

## 2018-03-02 ENCOUNTER — Emergency Department (HOSPITAL_COMMUNITY)
Admission: EM | Admit: 2018-03-02 | Discharge: 2018-03-02 | Disposition: A | Discharge status: Home / Self Care | Payer: 59 | Attending: Emergency Medicine | Admitting: Emergency Medicine

## 2018-03-02 DIAGNOSIS — Z79899 Other long term (current) drug therapy: Secondary | ICD-10-CM | POA: Insufficient documentation

## 2018-03-02 DIAGNOSIS — J45909 Unspecified asthma, uncomplicated: Secondary | ICD-10-CM | POA: Insufficient documentation

## 2018-03-02 DIAGNOSIS — I10 Essential (primary) hypertension: Secondary | ICD-10-CM | POA: Diagnosis not present

## 2018-03-02 DIAGNOSIS — J0110 Acute frontal sinusitis, unspecified: Secondary | ICD-10-CM | POA: Insufficient documentation

## 2018-03-02 DIAGNOSIS — F17210 Nicotine dependence, cigarettes, uncomplicated: Secondary | ICD-10-CM | POA: Insufficient documentation

## 2018-03-02 DIAGNOSIS — R51 Headache: Secondary | ICD-10-CM | POA: Diagnosis present

## 2018-03-02 MED ORDER — KETOROLAC TROMETHAMINE 30 MG/ML IJ SOLN
30.0000 mg | Freq: Once | INTRAMUSCULAR | Status: AC
  Administered 2018-03-02: 30 mg via INTRAMUSCULAR
  Filled 2018-03-02: qty 1

## 2018-03-02 MED ORDER — LORATADINE 10 MG PO TABS: 10.0000 mg | ORAL_TABLET | Freq: Every day | ORAL | 0 refills | Status: DC

## 2018-03-02 MED ORDER — AMOXICILLIN-POT CLAVULANATE 875-125 MG PO TABS: 1.0000 | ORAL_TABLET | Freq: Two times a day (BID) | ORAL | 0 refills | Status: DC

## 2018-03-02 MED ORDER — FLUTICASONE PROPIONATE 50 MCG/ACT NA SUSP: 1.0000 | Freq: Every day | NASAL | 2 refills | Status: DC

## 2018-03-02 NOTE — ED Provider Notes (Signed)
[facility] EMERGENCY DEPARTMENT Provider Note   CSN: [id number] Arrival date & time: 03/02/18  1024     History   Chief Complaint Chief Complaint  Patient presents with  . URI    HPI Carol Mack is a 47 y.o. female.  HPI    47 year old female presents today with complaints of facial pain. Patient notes symptoms started 3 days ago with right sided frontal sinus pressure and pain, she notes this radiates to her ear, she notes symptoms were improving yesterday but then again returned today. Patient notes this feels like a sinus infection that she's had them in the past, she does note a history of allergies but is not taking antihistamines or steroids. Patient denies any neurological deficits, head trauma, fever or neck stiffness. She denies any cough or shortness of breath.   Past Medical History:  Diagnosis Date  . Asthma   . Bartholin cyst   . Hernia, inguinal   . Hernia, inguinal   . Hypertension     Patient Active Problem List   Diagnosis Date Noted  . Multinodular goiter 01/19/2016  . Hyperthyroidism 09/25/2015  . Fibroid, uterine   . Fibroids 06/02/2014  . Menorrhagia with regular cycle   . Fibroid uterus 12/23/2013    Past Surgical History:  Procedure Laterality Date  . IR GENERIC HISTORICAL  07/27/2014   IR RADIOLOGIST EVAL & MGMT 07/27/2014 Markus Daft, MD [facility] INTERV RAD  . NO PAST SURGERIES       OB History    Gravida  2   Para  2   Term  2   Preterm      AB      Living  2     SAB      TAB      Ectopic      Multiple      Live Births  2            Home Medications    Prior to Admission medications   Medication Sig Start Date End Date Taking? Authorizing Provider  albuterol (PROVENTIL HFA;VENTOLIN HFA) 108 (90 BASE) MCG/ACT inhaler Inhale 2 puffs into the lungs every 6 (six) hours as needed for shortness of breath.    [provider]  amoxicillin-clavulanate (AUGMENTIN) 875-125 MG tablet Take 1  tablet by mouth every 12 (twelve) hours. 03/02/18   Mir Fullilove, Dellis Filbert, PA-C  fluticasone (FLONASE) 50 MCG/ACT nasal spray Place 1 spray into both nostrils daily. 03/02/18   Rocsi Hazelbaker, Dellis Filbert, PA-C  Ibuprofen (ADVIL) 200 MG CAPS Take 2 capsules by mouth as needed.    [provider]  loratadine (CLARITIN) 10 MG tablet Take 1 tablet (10 mg total) by mouth daily. 03/02/18   Okey Regal, PA-C    Family History Family History  Problem Relation Age of Onset  . Thyroid disease Mother        large benign goiter    Social History Social History   Tobacco Use  . Smoking status: Current Every Day Smoker    Packs/day: 1.00    Types: Cigarettes  . Smokeless tobacco: Never Used  Substance Use Topics  . Alcohol use: No  . Drug use: No     Allergies   Percocet [oxycodone-acetaminophen]   Review of Systems Review of Systems  All other systems reviewed and are negative.    Physical Exam Updated Vital Signs BP 135/86   Pulse 65   Temp 98.7 F (37.1 C) (Oral)   Resp 18  LMP 02/27/2018 (Approximate)   SpO2 100%   Physical Exam  Constitutional: She is oriented to person, place, and time. She appears well-developed and well-nourished.  HENT:  Head: Normocephalic and atraumatic.  Bilateral TMs normal - oropharynx clear no swelling or edema, neck supple full active range of motion no cervical lymphadenopathy  Eyes: Pupils are equal, round, and reactive to light. Conjunctivae are normal. Right eye exhibits no discharge. Left eye exhibits no discharge. No scleral icterus.  Neck: Normal range of motion. No JVD present. No tracheal deviation present.  Pulmonary/Chest: Effort normal. No stridor.  Neurological: She is alert and oriented to person, place, and time. No cranial nerve deficit or sensory deficit. She exhibits normal muscle tone. Coordination normal.  Psychiatric: She has a normal mood and affect. Her behavior is normal. Judgment and thought content normal.  Nursing note  and vitals reviewed.   ED Treatments / Results  Labs (all labs ordered are listed, but only abnormal results are displayed) Labs Reviewed - No data to display  EKG None  Radiology No results found.  Procedures Procedures (including critical care time)  Medications Ordered in ED Medications  ketorolac (TORADOL) 30 MG/ML injection 30 mg (30 mg Intramuscular Given 03/02/18 1315)     Initial Impression / Assessment and Plan / ED Course  I have reviewed the triage vital signs and the nursing notes.  Pertinent labs & imaging results that were available during my care of the patient were reviewed by me and considered in my medical decision making (see chart for details).     Labs:   Imaging:  Consults:  Therapeutics:Toradol  Discharge Meds: Augmentin, Claritin, Flonase  Assessment/Plan: 47 year old female presents today with headache likely secondary to sinusitis. Question allergic versus viral in nature, this is unilateral she did have improvement in symptoms and then secondary worsening which makes you question bacterial etiology. Patient be treated symptomatically here with Toradol, discharged with instructions to use antihistamines, Flonase if symptoms do not improve or worsen she will initiate antibiotic therapy and/or return to the emergency room for repeat evaluation. She verbalized understanding and agreement to today's plan.   Final Clinical Impressions(s) / ED Diagnoses   Final diagnoses:  Acute frontal sinusitis, recurrence not specified    ED Discharge Orders         Ordered    loratadine (CLARITIN) 10 MG tablet  Daily     03/02/18 1443    fluticasone (FLONASE) 50 MCG/ACT nasal spray  Daily     03/02/18 1443    amoxicillin-clavulanate (AUGMENTIN) 875-125 MG tablet  Every 12 hours     03/02/18 1443           HedgesDellis Filbert, PA-C 03/02/18 [place], [place], DO 03/02/18 2353

## 2018-03-02 NOTE — Discharge Instructions (Addendum)
Please read attached information. If you experience any new or worsening signs or symptoms please return to the emergency room for evaluation. Please follow-up with your primary care provider or specialist as discussed. Please use medication prescribed only as directed and discontinue taking if you have any concerning signs or symptoms.   °

## 2018-03-02 NOTE — ED Triage Notes (Signed)
Pt to ER for 2 days of facial pain, nasal congestion, and now right ear pain.

## 2018-05-28 ENCOUNTER — Ambulatory Visit (INDEPENDENT_AMBULATORY_CARE_PROVIDER_SITE_OTHER): Payer: 59 | Admitting: Family Medicine

## 2018-05-28 ENCOUNTER — Encounter: Payer: Self-pay | Admitting: Family Medicine

## 2018-05-28 VITALS — BP 136/90 | HR 78 | Temp 98.5°F | Ht 64.0 in | Wt 147.0 lb

## 2018-05-28 DIAGNOSIS — I10 Essential (primary) hypertension: Secondary | ICD-10-CM | POA: Diagnosis not present

## 2018-05-28 DIAGNOSIS — E049 Nontoxic goiter, unspecified: Secondary | ICD-10-CM | POA: Diagnosis not present

## 2018-05-28 DIAGNOSIS — F17210 Nicotine dependence, cigarettes, uncomplicated: Secondary | ICD-10-CM

## 2018-05-28 DIAGNOSIS — D219 Benign neoplasm of connective and other soft tissue, unspecified: Secondary | ICD-10-CM

## 2018-05-28 DIAGNOSIS — Z7689 Persons encountering health services in other specified circumstances: Secondary | ICD-10-CM

## 2018-05-28 LAB — BASIC METABOLIC PANEL
BUN: 12 mg/dL (ref 6–23)
CO2: 26 mEq/L (ref 19–32)
Calcium: 9.5 mg/dL (ref 8.4–10.5)
Chloride: 105 mEq/L (ref 96–112)
Creatinine, Ser: 0.71 mg/dL (ref 0.40–1.20)
GFR: 113.07 mL/min (ref >60.00)
Glucose, Bld: 78 mg/dL (ref 70–99)
Potassium: 4.4 mEq/L (ref 3.5–5.1)
Sodium: 138 mEq/L (ref 135–145)

## 2018-05-28 LAB — T4, FREE: Free T4: 0.79 ng/dL (ref 0.60–1.60)

## 2018-05-28 LAB — TSH: TSH: 1.38 u[IU]/mL (ref 0.35–4.50)

## 2018-05-28 NOTE — Progress Notes (Signed)
Patient presents to clinic today to establish care.  SUBJECTIVE: PMH: Pt is a 47 yo female with pmh sig for elevated bp, hyperthyroidism, fibroids, asthma?.  Pt does not recall who she was seen by in the past.    Elevated bp: -pt told bp elevated several times in the past -Has never been on medication -Endorses headaches, red eyes, feeling bad, tingling in fingers, dizziness -Has not been checking BP -May drink 2 cups of water per day -Eating fast food daily  Hyperthyroidism s/p ablation: -endorses taking radioactive pill -Per chart review multinodular goiter.  Followed by Dr. Loanne Drilling -Endorses continued enlargement of neck -Not currently on medication for thyroid  Asthma?: -endorses nightly SOB -Using albuterol inhaler -Smoking cigarettes  Allergies: Percocet-jittery/feels sick  Social history: Patient denies alcohol and drug use.  Patient endorses smoking 1 pack/day since age 60.  Patient has considered quitting.   Past Medical History:  Diagnosis Date  . Asthma   . Bartholin cyst   . Hernia, inguinal   . Hernia, inguinal   . Hypertension     Past Surgical History:  Procedure Laterality Date  . IR GENERIC HISTORICAL  07/27/2014   IR RADIOLOGIST EVAL & MGMT 07/27/2014 Markus Daft, MD [facility] INTERV RAD  . NO PAST SURGERIES      Current Outpatient Medications on File Prior to Visit  Medication Sig Dispense Refill  . albuterol (PROVENTIL HFA;VENTOLIN HFA) 108 (90 BASE) MCG/ACT inhaler Inhale 2 puffs into the lungs every 6 (six) hours as needed for shortness of breath.    . fluticasone (FLONASE) 50 MCG/ACT nasal spray Place 1 spray into both nostrils daily. 9.9 g 2  . Ibuprofen (ADVIL) 200 MG CAPS Take 2 capsules by mouth as needed.    . loratadine (CLARITIN) 10 MG tablet Take 1 tablet (10 mg total) by mouth daily. 30 tablet 0  . metroNIDAZOLE (METROCREAM) 0.75 % cream Apply topically 5 (five) times daily.     No current facility-administered medications on file  prior to visit.     Allergies  Allergen Reactions  . Percocet [Oxycodone-Acetaminophen] Other (See Comments)    jittery    Family History  Problem Relation Age of Onset  . Thyroid disease Mother        large benign goiter    Social History   Socioeconomic History  . Marital status: Single    Spouse name: Not on file  . Number of children: Not on file  . Years of education: Not on file  . Highest education level: Not on file  Occupational History  . Not on file  Social Needs  . Financial resource strain: Not on file  . Food insecurity:    Worry: Not on file    Inability: Not on file  . Transportation needs:    Medical: Not on file    Non-medical: Not on file  Tobacco Use  . Smoking status: Current Every Day Smoker    Packs/day: 1.00    Types: Cigarettes  . Smokeless tobacco: Never Used  Substance and Sexual Activity  . Alcohol use: No  . Drug use: No  . Sexual activity: Not on file  Lifestyle  . Physical activity:    Days per week: Not on file    Minutes per session: Not on file  . Stress: Not on file  Relationships  . Social connections:    Talks on phone: Not on file    Gets together: Not on file    Attends religious  service: Not on file    Active member of club or organization: Not on file    Attends meetings of clubs or organizations: Not on file    Relationship status: Not on file  . Intimate partner violence:    Fear of current or ex partner: Not on file    Emotionally abused: Not on file    Physically abused: Not on file    Forced sexual activity: Not on file  Other Topics Concern  . Not on file  Social History Narrative  . Not on file    ROS General: Denies fever, chills, night sweats, changes in weight, changes in appetite  +dizziness HEENT: Denies headaches, ear pain, changes in vision, rhinorrhea, sore throat  +HAs, red eyes CV: Denies CP, palpitations, SOB, orthopnea Pulm: Denies SOB, cough, wheezing  +SOB GI: Denies abdominal pain,  nausea, vomiting, diarrhea, constipation +bloating GU: Denies dysuria, hematuria, frequency, vaginal discharge Msk: Denies muscle cramps, joint pains Neuro: Denies weakness, numbness, tingling Skin: Denies rashes, bruising Psych: Denies depression, anxiety, hallucinations   BP 136/90 (BP Location: Left Arm, Patient Position: Sitting, Cuff Size: Normal)   Pulse 78   Temp 98.5 F (36.9 C) (Oral)   Ht 5\' 4"  (1.626 m)   Wt 147 lb (66.7 kg)   SpO2 98%   BMI 25.23 kg/m   Physical Exam Gen. Pleasant, well developed, well-nourished, in NAD HEENT - [place]/AT, PERRL, no scleral icterus, no nasal drainage, pharynx without erythema or exudate. Neck: thyromegaly L lobe>R Lungs: no use of accessory muscles, CTAB, no wheezes, rales or rhonchi Cardiovascular: RRR, No r/g/m, no peripheral edema Abdomen: BS present, soft, nontender,mildly distended Neuro:  A&Ox3, CN II-XII intact, normal gait Skin:  Warm, dry, intact, no lesions  No results found for this or any previous visit (from the past 2160 hour(s)).  Assessment/Plan: Essential hypertension  -numerous mentions of elevated bp -Discussed lifestyle modifications.  Patient to increase p.o. intake of water and reduce sodium intake by cutting down on fast food and reading labels. -will start medication based on labs. - Plan: Basic metabolic panel  Goiter  -s/p ablation for multinodular - Plan: TSH, T4, Free  Fibroids -advised to f/u with OB/Gyn  Cigarette nicotine dependence without complication -Smoking cessation counseling greater than 3 minutes, less than 10 minutes -Discussed cutting down.  Patient will try this. -We will reassess at each visit  Encounter to establish care -We reviewed the PMH, PSH, FH, SH, Meds and Allergies. -We provided refills for any medications we will prescribe as needed. -We addressed current concerns per orders and patient instructions. -We have asked for records for pertinent exams, studies, vaccines and  notes from previous providers. -We have advised patient to follow up per instructions below.   Follow-up in 1 month, sooner if needed  Grier Mitts, MD

## 2018-05-28 NOTE — Patient Instructions (Addendum)
Goiter A goiter is an enlarged thyroid gland. The thyroid gland is located in the lower front of the neck. The gland produces hormones that regulate mood, body temperature, pulse rate, and digestion. Most goiters are painless and are not a cause for serious concern. Goiters and conditions that cause goiters can be treated, if necessary. What are the causes? Causes of this condition include:  Diseases that attack healthy cells in your body (autoimmune diseases) and affect your thyroid function, such as: ? Graves disease. This causes too much thyroid hormone to be produced and it makes your thyroid overly active (hyperthyroidism). ? Hashimoto disease. This type of inflammation of the thyroid (thyroiditis) causes too little thyroid hormone to be produced and it makes your thyroid not active enough (hypothyroidism).  Other conditions that cause thyroiditis.  Nodular goiter. This means that there are one or more small growths on your thyroid. These can create too much thyroid hormone.  Pregnancy.  Thyroid cancer. This is rare.  Certain medicines.  Radiation exposure.  Iodine deficiency.  In some cases, the cause may not be known (idiopathic). What increases the risk? This condition is more likely to develop in:  People who have a family history of goiter.  Women.  People who do not get enough iodine in their diet.  People who are older than 84.  People who smoke tobacco.  What are the signs or symptoms? Common symptoms of this condition include:  Swelling in the lower part of the neck. This swelling can range from a very small bump to a large lump.  A tight feeling in the throat.  A hoarse voice.  Other symptoms include:  Coughing.  Wheezing.  Difficulty swallowing.  Difficulty breathing.  Bulging neck veins.  Dizziness.  In some cases, there are no symptoms and thyroid hormone levels may be normal. When a goiter is the result of hyperthyroidism, symptoms may  also include:  Nervousness or restlessness.  Inability to tolerate heat.  Unexplained weight loss.  Diarrhea.  Change in the texture of hair or skin.  Changes in heart beat, such as skipped beats, extra beats, or a rapid heart rate.  Loss of menstruation.  Shaky hands.  Increased appetite.  Sleep problems.  When a goiter is the result of hypothyroidism, symptoms may also include:  Feeling like you have no energy (lethargy).  Inability to tolerate cold.  Weight gain that is not explained by a change in diet or exercise habits.  Dry skin.  Coarse hair.  Menstrual irregularity.  Constipation.  Sadness or depression.  How is this diagnosed? This condition may be diagnosed with a medical history and physical exam. You may also have other tests, including:  Blood tests to check thyroid function.  Imaging tests, such as: ? Ultrasonography. ? CT scan. ? MRI. ? Thyroid scan. You will be given a safe radioactive injection, then images will be taken of your thyroid.  Tissue sample (biopsy) of the goiter or any nodules. This checks to see if the goiter or nodules are cancerous.  How is this treated? Treatment for this condition depends on the cause. Treatment may include:  Medicines to control your thyroid.  Anti-inflammatory or steroid medicines, if inflammation is the cause.  Iodine supplements or changes in diet, if the goiter is caused by iodine deficiency.  Radiation therapy.  Surgery to remove your thyroid.  In some cases, no treatment is necessary, and your health care provider will monitor your condition at regular checkups. Follow these instructions at  home:  Follow recommendations from your health care provider for any changes to your diet.  Take over-the-counter and prescription medicines only as told by your health care provider.  Do not use any tobacco products, including cigarettes, chewing tobacco, or e-cigarettes. If you need help quitting,  ask your health care provider.  Keep all follow-up appointments as told by your health care provider. This is important. Contact a health care provider if:  Your symptoms do not get better with treatment. Get help right away if:  You develop sudden, unexplained confusion or other mental changes.  You have nausea, vomiting, or diarrhea.  You develop a fever.  Your skin or the whites of your eyes appear yellow (jaundice).  You develop chest pain.  You have trouble breathing or swallowing.  You suddenly become very weak.  You experience extreme restlessness. This information is not intended to replace advice given to you by your health care provider. Make sure you discuss any questions you have with your health care provider. Document Released: 12/19/2009 Document Revised: 01/19/2016 Document Reviewed: 06/27/2014 Elsevier Interactive Patient Education  2018 [employer].  How to Take Your Blood Pressure You can take your blood pressure at home with a machine. You may need to check your blood pressure at home:  To check if you have high blood pressure (hypertension).  To check your blood pressure over time.  To make sure your blood pressure medicine is working.  Supplies needed: You will need a blood pressure machine, or monitor. You can buy one at a drugstore or online. When choosing one:  Choose one with an arm cuff.  Choose one that wraps around your upper arm. Only one finger should fit between your arm and the cuff.  Do not choose one that measures your blood pressure from your wrist or finger.  Your doctor can suggest a monitor. How to prepare Avoid these things for 30 minutes before checking your blood pressure:  Drinking caffeine.  Drinking alcohol.  Eating.  Smoking.  Exercising.  Five minutes before checking your blood pressure:  Pee.  Sit in a dining chair. Avoid sitting in a soft couch or armchair.  Be quiet. Do not talk.  How to take your  blood pressure Follow the instructions that came with your machine. If you have a digital blood pressure monitor, these may be the instructions: 1. Sit up straight. 2. Place your feet on the floor. Do not cross your ankles or legs. 3. Rest your left arm at the level of your heart. You may rest it on a table, desk, or chair. 4. Pull up your shirt sleeve. 5. Wrap the blood pressure cuff around the upper part of your left arm. The cuff should be 1 inch (2.5 cm) above your elbow. It is best to wrap the cuff around bare skin. 6. Fit the cuff snugly around your arm. You should be able to place only one finger between the cuff and your arm. 7. Put the cord inside the groove of your elbow. 8. Press the power button. 9. Sit quietly while the cuff fills with air and loses air. 10. Write down the numbers on the screen. 11. Wait 2-3 minutes and then repeat steps 1-10.  What do the numbers mean? Two numbers make up your blood pressure. The first number is called systolic pressure. The second is called diastolic pressure. An example of a blood pressure reading is "120 over 80" (or 120/80). If you are an adult and do not have  a medical condition, use this guide to find out if your blood pressure is normal: Normal  First number: below 120.  Second number: below 80. Elevated  First number: 120-129.  Second number: below 80. Hypertension stage 1  First number: 130-139.  Second number: 80-89. Hypertension stage 2  First number: 140 or above.  Second number: 64 or above. Your blood pressure is above normal even if only the top or bottom number is above normal. Follow these instructions at home:  Check your blood pressure as often as your doctor tells you to.  Take your monitor to your next doctor's appointment. Your doctor will: ? Make sure you are using it correctly. ? Make sure it is working right.  Make sure you understand what your blood pressure numbers should be.  Tell your doctor  if your medicines are causing side effects. Contact a doctor if:  Your blood pressure keeps being high. Get help right away if:  Your first blood pressure number is higher than 180.  Your second blood pressure number is higher than 120. This information is not intended to replace advice given to you by your health care provider. Make sure you discuss any questions you have with your health care provider. Document Released: 06/13/2008 Document Revised: 05/29/2016 Document Reviewed: 12/08/2015 Elsevier Interactive Patient Education  2018 [employer].  Preventing Hypertension Hypertension, commonly called high blood pressure, is when the force of blood pumping through the arteries is too strong. Arteries are blood vessels that carry blood from the heart throughout the body. Over time, hypertension can damage the arteries and decrease blood flow to important parts of the body, including the brain, heart, and kidneys. Often, hypertension does not cause symptoms until blood pressure is very high. For this reason, it is important to have your blood pressure checked on a regular basis. Hypertension can often be prevented with diet and lifestyle changes. If you already have hypertension, you can control it with diet and lifestyle changes, as well as medicine. What nutrition changes can be made? Maintain a healthy diet. This includes:  Eating less salt (sodium). Ask your health care provider how much sodium is safe for you to have. The general recommendation is to consume less than 1 tsp (2,300 mg) of sodium a day. ? Do not add salt to your food. ? Choose low-sodium options when grocery shopping and eating out.  Limiting fats in your diet. You can do this by eating low-fat or fat-free dairy products and by eating less red meat.  Eating more fruits, vegetables, and whole grains. Make a goal to eat: ? 1-2 cups of fresh fruits and vegetables each day. ? 3-4 servings of whole grains each  day.  Avoiding foods and beverages that have added sugars.  Eating fish that contain healthy fats (omega-3 fatty acids), such as mackerel or salmon.  If you need help putting together a healthy eating plan, try the DASH diet. This diet is high in fruits, vegetables, and whole grains. It is low in sodium, red meat, and added sugars. DASH stands for Dietary Approaches to Stop Hypertension. What lifestyle changes can be made?  Lose weight if you are overweight. Losing just 3?5% of your body weight can help prevent or control hypertension. ? For example, if your present weight is 200 lb (91 kg), a loss of 3-5% of your weight means losing 6-10 lb (2.7-4.5 kg). ? Ask your health care provider to help you with a diet and exercise plan to safely lose  weight.  Get enough exercise. Do at least 150 minutes of moderate-intensity exercise each week. ? You could do this in short exercise sessions several times a day, or you could do longer exercise sessions a few times a week. For example, you could take a brisk 10-minute walk or bike ride, 3 times a day, for 5 days a week.  Find ways to reduce stress, such as exercising, meditating, listening to music, or taking a yoga class. If you need help reducing stress, ask your health care provider.  Do not smoke. This includes e-cigarettes. Chemicals in tobacco and nicotine products raise your blood pressure each time you smoke. If you need help quitting, ask your health care provider.  Avoid alcohol. If you drink alcohol, limit alcohol intake to no more than 1 drink a day for nonpregnant women and 2 drinks a day for men. One drink equals 12 oz of beer, 5 oz of wine, or 1 oz of hard liquor. Why are these changes important? Diet and lifestyle changes can help you prevent hypertension, and they may make you feel better overall and improve your quality of life. If you have hypertension, making these changes will help you control it and help prevent major  complications, such as:  Hardening and narrowing of arteries that supply blood to: ? Your heart. This can cause a heart attack. ? Your brain. This can cause a stroke. ? Your kidneys. This can cause kidney failure.  Stress on your heart muscle, which can cause heart failure.  What can I do to lower my risk?  Work with your health care provider to make a hypertension prevention plan that works for you. Follow your plan and keep all follow-up visits as told by your health care provider.  Learn how to check your blood pressure at home. Make sure that you know your personal target blood pressure, as told by your health care provider. How is this treated? In addition to diet and lifestyle changes, your health care provider may recommend medicines to help lower your blood pressure. You may need to try a few different medicines to find what works best for you. You also may need to take more than one medicine. Take over-the-counter and prescription medicines only as told by your health care provider. Where to find support: Your health care provider can help you prevent hypertension and help you keep your blood pressure at a healthy level. Your local hospital or your community may also provide support services and prevention programs. The American Heart Association offers an online support network at: CheapBootlegs.com.cy Where to find more information: Learn more about hypertension from:  National Heart, Lung, and Blood Institute: ElectronicHangman.is  Centers for Disease Control and Prevention: https://ingram.com/  American Academy of Family Physicians: http://familydoctor.org/familydoctor/en/diseases-conditions/high-blood-pressure.printerview.all.html  Learn more about the DASH diet from:  [place], Lung, and [place]: https://www.reyes.com/  Contact a health care provider if:  You  think you are having a reaction to medicines you have taken.  You have recurrent headaches or feel dizzy.  You have swelling in your ankles.  You have trouble with your vision. Summary  Hypertension often does not cause any symptoms until blood pressure is very high. It is important to get your blood pressure checked regularly.  Diet and lifestyle changes are the most important steps in preventing hypertension.  By keeping your blood pressure in a healthy range, you can prevent complications like heart attack, heart failure, stroke, and kidney failure.  Work with your health care provider  to make a hypertension prevention plan that works for you. This information is not intended to replace advice given to you by your health care provider. Make sure you discuss any questions you have with your health care provider. Document Released: 07/16/2015 Document Revised: 03/11/2016 Document Reviewed: 03/11/2016 Elsevier Interactive Patient Education  2018 [employer].  Steps to Quit Smoking Smoking tobacco can be bad for your health. It can also affect almost every organ in your body. Smoking puts you and people around you at risk for many serious long-lasting (chronic) diseases. Quitting smoking is hard, but it is one of the best things that you can do for your health. It is never too late to quit. What are the benefits of quitting smoking? When you quit smoking, you lower your risk for getting serious diseases and conditions. They can include:  Lung cancer or lung disease.  Heart disease.  Stroke.  Heart attack.  Not being able to have children (infertility).  Weak bones (osteoporosis) and broken bones (fractures).  If you have coughing, wheezing, and shortness of breath, those symptoms may get better when you quit. You may also get sick less often. If you are pregnant, quitting smoking can help to lower your chances of having a baby of low birth weight. What can I do to help me quit  smoking? Talk with your doctor about what can help you quit smoking. Some things you can do (strategies) include:  Quitting smoking totally, instead of slowly cutting back how much you smoke over a period of time.  Going to in-person counseling. You are more likely to quit if you go to many counseling sessions.  Using resources and support systems, such as: ? Database administrator with a Social worker. ? Phone quitlines. ? Careers information officer. ? Support groups or group counseling. ? Text messaging programs. ? Mobile phone apps or applications.  Taking medicines. Some of these medicines may have nicotine in them. If you are pregnant or breastfeeding, do not take any medicines to quit smoking unless your doctor says it is okay. Talk with your doctor about counseling or other things that can help you.  Talk with your doctor about using more than one strategy at the same time, such as taking medicines while you are also going to in-person counseling. This can help make quitting easier. What things can I do to make it easier to quit? Quitting smoking might feel very hard at first, but there is a lot that you can do to make it easier. Take these steps:  Talk to your family and friends. Ask them to support and encourage you.  Call phone quitlines, reach out to support groups, or work with a Social worker.  Ask people who smoke to not smoke around you.  Avoid places that make you want (trigger) to smoke, such as: ? Bars. ? Parties. ? Smoke-break areas at work.  Spend time with people who do not smoke.  Lower the stress in your life. Stress can make you want to smoke. Try these things to help your stress: ? Getting regular exercise. ? Deep-breathing exercises. ? Yoga. ? Meditating. ? Doing a body scan. To do this, close your eyes, focus on one area of your body at a time from head to toe, and notice which parts of your body are tense. Try to relax the muscles in those areas.  Download or buy  apps on your mobile phone or tablet that can help you stick to your quit plan. There are many  free apps, such as QuitGuide from the [employer] Office manager for Disease Control and Prevention). You can find more support from smokefree.gov and other websites.  This information is not intended to replace advice given to you by your health care provider. Make sure you discuss any questions you have with your health care provider. Document Released: 04/27/2009 Document Revised: 02/27/2016 Document Reviewed: 11/15/2014 Elsevier Interactive Patient Education  2018 [employer].

## 2018-06-03 ENCOUNTER — Telehealth: Payer: Self-pay | Admitting: Family Medicine

## 2018-06-03 NOTE — Telephone Encounter (Signed)
Copied from [place] [phone]. Topic: Quick Communication - See Telephone Encounter >> Jun 03, 2018 12:23 PM Vernona Rieger wrote: CRM for notification. See Telephone encounter for: 06/03/18.  Patient states she received her lab results back and everything was okay with her blood pressure. She is wanting to know is there a PRN medication she can take for when her blood pressure does go up? Please advise.

## 2018-06-03 NOTE — Telephone Encounter (Signed)
Please Advise

## 2018-06-05 NOTE — Telephone Encounter (Signed)
Pt should quit smoking, reduce sodium intake, and increase intake of water to help her bp.  She should also be checking her bp at home and keeping a log of the readings to bring with her to her f/u appt in 2-4 wks. If needed at that time medication will be started.

## 2018-06-08 NOTE — Telephone Encounter (Signed)
Called pt left a detailed message with  recommendations from dr Volanda Napoleon regarding  Bp medications

## 2018-08-07 ENCOUNTER — Ambulatory Visit: Payer: 59 | Admitting: Obstetrics & Gynecology

## 2018-08-07 ENCOUNTER — Other Ambulatory Visit: Payer: Self-pay | Admitting: Obstetrics & Gynecology

## 2018-08-07 ENCOUNTER — Encounter: Payer: Self-pay | Admitting: Obstetrics & Gynecology

## 2018-08-07 VITALS — BP 153/89 | HR 79 | Wt 147.0 lb

## 2018-08-07 DIAGNOSIS — Z124 Encounter for screening for malignant neoplasm of cervix: Secondary | ICD-10-CM

## 2018-08-07 DIAGNOSIS — B9689 Other specified bacterial agents as the cause of diseases classified elsewhere: Secondary | ICD-10-CM | POA: Diagnosis not present

## 2018-08-07 DIAGNOSIS — N760 Acute vaginitis: Secondary | ICD-10-CM

## 2018-08-07 DIAGNOSIS — N6320 Unspecified lump in the left breast, unspecified quadrant: Secondary | ICD-10-CM | POA: Diagnosis not present

## 2018-08-07 DIAGNOSIS — N920 Excessive and frequent menstruation with regular cycle: Secondary | ICD-10-CM

## 2018-08-07 DIAGNOSIS — Z0000 Encounter for general adult medical examination without abnormal findings: Secondary | ICD-10-CM

## 2018-08-07 DIAGNOSIS — N898 Other specified noninflammatory disorders of vagina: Secondary | ICD-10-CM

## 2018-08-07 DIAGNOSIS — Z113 Encounter for screening for infections with a predominantly sexual mode of transmission: Secondary | ICD-10-CM | POA: Diagnosis not present

## 2018-08-07 DIAGNOSIS — I10 Essential (primary) hypertension: Secondary | ICD-10-CM

## 2018-08-07 DIAGNOSIS — Z01419 Encounter for gynecological examination (general) (routine) without abnormal findings: Secondary | ICD-10-CM

## 2018-08-07 DIAGNOSIS — D219 Benign neoplasm of connective and other soft tissue, unspecified: Secondary | ICD-10-CM | POA: Diagnosis not present

## 2018-08-07 MED ORDER — CLINDAMYCIN PHOSPHATE 1 % EX GEL: Freq: Two times a day (BID) | CUTANEOUS | 11 refills | Status: DC

## 2018-08-07 MED ORDER — AMLODIPINE BESYLATE 5 MG PO TABS: 5.0000 mg | ORAL_TABLET | Freq: Every day | ORAL | 1 refills | Status: DC

## 2018-08-07 MED ORDER — CLINDAMYCIN PHOSPHATE 2 % VA CREA: 1.0000 | TOPICAL_CREAM | Freq: Every day | VAGINAL | 0 refills | Status: DC

## 2018-08-07 NOTE — Progress Notes (Signed)
Wants to talk about about fibroids, wanted to see if she needed a ablation or partial hysterectomy , c/o lump in right breast. Wants to discuss blood pressure, was told by pcp that her BP was normal at that moment that there was nothing she could do about it, pt is intermittently seeing spots and feeling dizzy at work. Wants to discuss recurrent BV .

## 2018-08-07 NOTE — Progress Notes (Signed)
Patient ID: Carol Mack, female   DOB: 12/03/1970, 48 y.o.   MRN: [id number]  Chief Complaint  Patient presents with  . Gynecologic Exam  heavy menses with pain  HPI Carol Mack is a 48 y.o. female.  [id number] Patient's last menstrual period was 08/04/2018. She had [place] 2015 for fibroid but her periods are still heavy and painful but regular. She can feel the mass in her RLQ, HPI  Past Medical History:  Diagnosis Date  . Asthma   . Bartholin cyst   . Hernia, inguinal   . Hernia, inguinal   . Hypertension     Past Surgical History:  Procedure Laterality Date  . IR GENERIC HISTORICAL  07/27/2014   IR RADIOLOGIST EVAL & MGMT 07/27/2014 Markus Daft, MD [facility] INTERV RAD  . NO PAST SURGERIES      Family History  Problem Relation Age of Onset  . Thyroid disease Mother        large benign goiter    Social History Social History   Tobacco Use  . Smoking status: Current Every Day Smoker    Packs/day: 1.00    Types: Cigarettes  . Smokeless tobacco: Never Used  Substance Use Topics  . Alcohol use: No  . Drug use: No    Allergies  Allergen Reactions  . Percocet [Oxycodone-Acetaminophen] Other (See Comments)    jittery    Current Outpatient Medications  Medication Sig Dispense Refill  . albuterol (PROVENTIL HFA;VENTOLIN HFA) 108 (90 BASE) MCG/ACT inhaler Inhale 2 puffs into the lungs every 6 (six) hours as needed for shortness of breath.    . fluticasone (FLONASE) 50 MCG/ACT nasal spray Place 1 spray into both nostrils daily. 9.9 g 2  . Ibuprofen (ADVIL) 200 MG CAPS Take 2 capsules by mouth as needed.    . loratadine (CLARITIN) 10 MG tablet Take 1 tablet (10 mg total) by mouth daily. 30 tablet 0  . metroNIDAZOLE (METROCREAM) 0.75 % cream Apply topically 5 (five) times daily.    Marland Kitchen amLODipine (NORVASC) 5 MG tablet Take 1 tablet (5 mg total) by mouth daily. 30 tablet 1  . clindamycin (CLINDAGEL) 1 % gel Apply topically 2 (two) times daily. 30 g 11   No current  facility-administered medications for this visit.     Review of Systems Review of Systems  Constitutional: Positive for fatigue.  Gastrointestinal: Positive for abdominal pain.  Genitourinary: Positive for menstrual problem, pelvic pain and vaginal discharge (BV sx).  Neurological: Positive for headaches.  Psychiatric/Behavioral: Positive for dysphoric mood.    Blood pressure (!) 153/89, pulse 79, weight 147 lb (66.7 kg), last menstrual period 08/04/2018.  Physical Exam Physical Exam Constitutional:      Appearance: She is ill-appearing.  Eyes:     Pupils: Pupils are equal, round, and reactive to light.  Neck:     Musculoskeletal: Normal range of motion.  Abdominal:     General: Abdomen is flat.     Palpations: There is mass.  Genitourinary:    General: Normal vulva.     Vagina: Vaginal discharge present.     Comments: Uterine enlargement 10-12 week size firm, pap done Neurological:     Mental Status: She is alert.   Breasts: breasts appear normal, no suspicious masses, no skin or nipple changes or axillary nodes, abnormal mass palpable left lower edge tender 2 cm possible abscess.   Data Reviewed [place] and IR notes, family practice note  Assessment    Patient Active Problem List   Diagnosis Date  Noted  . Left breast lump 08/07/2018  . Goiter 05/28/2018  . Essential hypertension 05/28/2018  . Cigarette nicotine dependence without complication 60/04/9322  . Multinodular goiter 01/19/2016  . Hyperthyroidism 09/25/2015  . Fibroid, uterine   . Fibroids 06/02/2014  . Menorrhagia with regular cycle   . Fibroid uterus 12/23/2013       Plan    Norvasc for HTN and f/u to check BP Concerned about recovery time for possible hysterectomy and will consult Dr Ihor Dow to see if she is candidate for [facility] for recurrent BV Dr mammogram re left breast mass Pelvic US       [name] 08/07/2018, 9:52 AM

## 2018-08-13 ENCOUNTER — Ambulatory Visit
Admission: RE | Admit: 2018-08-13 | Discharge: 2018-08-13 | Disposition: A | Discharge status: Home / Self Care | Payer: 59 | Source: Ambulatory Visit | Attending: Obstetrics & Gynecology | Admitting: Obstetrics & Gynecology

## 2018-08-13 ENCOUNTER — Other Ambulatory Visit: Payer: Self-pay | Admitting: Obstetrics & Gynecology

## 2018-08-13 ENCOUNTER — Ambulatory Visit (HOSPITAL_COMMUNITY)
Admission: RE | Admit: 2018-08-13 | Discharge: 2018-08-13 | Disposition: A | Discharge status: Home / Self Care | Payer: 59 | Source: Ambulatory Visit | Attending: Obstetrics & Gynecology | Admitting: Obstetrics & Gynecology

## 2018-08-13 DIAGNOSIS — N611 Abscess of the breast and nipple: Secondary | ICD-10-CM

## 2018-08-13 DIAGNOSIS — N6320 Unspecified lump in the left breast, unspecified quadrant: Secondary | ICD-10-CM

## 2018-08-13 DIAGNOSIS — D219 Benign neoplasm of connective and other soft tissue, unspecified: Secondary | ICD-10-CM | POA: Insufficient documentation

## 2018-08-13 LAB — CYTOLOGY - PAP
Bacterial vaginitis: POSITIVE — AB
Candida vaginitis: NEGATIVE
Chlamydia: NEGATIVE
Diagnosis: NEGATIVE
Neisseria Gonorrhea: NEGATIVE
Trichomonas: POSITIVE — AB

## 2018-08-18 LAB — AEROBIC/ANAEROBIC CULTURE W GRAM STAIN (SURGICAL/DEEP WOUND): Special Requests: NORMAL

## 2018-08-19 ENCOUNTER — Encounter (HOSPITAL_COMMUNITY): Payer: Self-pay

## 2018-08-19 ENCOUNTER — Encounter: Payer: Self-pay | Admitting: Obstetrics & Gynecology

## 2018-08-19 ENCOUNTER — Other Ambulatory Visit (HOSPITAL_COMMUNITY)
Admission: RE | Admit: 2018-08-19 | Discharge: 2018-08-19 | Disposition: A | Discharge status: Home / Self Care | Payer: 59 | Source: Ambulatory Visit | Attending: Obstetrics & Gynecology | Admitting: Obstetrics & Gynecology

## 2018-08-19 ENCOUNTER — Ambulatory Visit (INDEPENDENT_AMBULATORY_CARE_PROVIDER_SITE_OTHER): Payer: 59 | Admitting: Obstetrics & Gynecology

## 2018-08-19 VITALS — BP 131/95 | HR 79 | Wt 150.0 lb

## 2018-08-19 DIAGNOSIS — N939 Abnormal uterine and vaginal bleeding, unspecified: Secondary | ICD-10-CM

## 2018-08-19 DIAGNOSIS — Z3202 Encounter for pregnancy test, result negative: Secondary | ICD-10-CM

## 2018-08-19 LAB — POCT PREGNANCY, URINE: Preg Test, Ur: NEGATIVE

## 2018-08-19 NOTE — Patient Instructions (Signed)
Hysterectomy Information  A hysterectomy is a surgery in which the uterus is removed. The fallopian tubes and ovaries may be removed (bilateral salpingo-oophorectomy) as well. This procedure may be done to treat various medical problems. After the procedure, a woman will no longer have menstrual periods nor will she be able to become pregnant (sterile). What are the reasons for a hysterectomy? There are many reasons why a woman might have this procedure. They include:  Persistent, abnormal vaginal bleeding.  Long-term (chronic) pelvic pain or infection.  Endometriosis. This is when the lining of the uterus (endometrium) starts to grow outside the uterus.  Adenomyosis. This is when the endometrium starts to grow in the muscle of the uterus.  Pelvic organ prolapse. This is a condition in which the uterus falls down into the vagina.  Noncancerous growths in the uterus (uterine fibroids) that cause symptoms.  The presence of precancerous cells.  Cervical or uterine cancer. What are the different types of hysterectomy? There are three different types of hysterectomy:  Supracervical hysterectomy. In this type, the top part of the uterus is removed, but not the cervix.  Total hysterectomy. In this type, the uterus and cervix are removed.  Radical hysterectomy. In this type, the uterus, the cervix, and the tissue that holds the uterus in place (parametrium) are removed. What are the different ways a hysterectomy can be performed? There are many different ways a hysterectomy can be performed, including:  Abdominal hysterectomy. In this type, an incision is made in the abdomen. The uterus is removed through this incision.  Vaginal hysterectomy. In this type, an incision is made in the vagina. The uterus is removed through this incision. There are no abdominal incisions.  Conventional laparoscopic hysterectomy. In this type, three or four small incisions are made in the abdomen. A thin,  lighted tube with a camera (laparoscope) is inserted into one of the incisions. Other tools are put through the other incisions. The uterus is cut into small pieces. The small pieces are removed through the incisions or through the vagina.  Laparoscopically assisted vaginal hysterectomy (LAVH). In this type, three or four small incisions are made in the abdomen. Part of the surgery is performed laparoscopically and the other part is done vaginally. The uterus is removed through the vagina.  Robot-assisted laparoscopic hysterectomy. In this type, a laparoscope and other tools are inserted into three or four small incisions in the abdomen. A computer-controlled device is used to give the surgeon a 3D image and to help control the surgical instruments. This allows for more precise movements of surgical instruments. The uterus is cut into small pieces and removed through the incisions or removed through the vagina. Discuss the options with your health care provider to determine which type is the right one for you. What are the risks? Generally, this is a safe procedure. However, problems may occur, including:  Bleeding and risk of blood transfusion. Tell your health care provider if you do not want to receive any blood products.  Blood clots in the legs or lung.  Infection.  Damage to other structures or organs.  Allergic reactions to medicines.  Changing to an abdominal hysterectomy from one of the other techniques. What to expect after a hysterectomy  You will be given pain medicine.  You may need to stay in the hospital for 1- 2 days to recover, depending on the type of hysterectomy you had.  Follow your health care provider's instructions about exercise, driving, and general activities. Ask your   health care provider what activities are safe for you.  You will need to have someone with you for the first 3-5 days after you go home.  You will need to follow up with your surgeon in 2-4  weeks after surgery to evaluate your progress.  If the ovaries are removed, you will have early menopause symptoms such as hot flashes, night sweats, and insomnia.  If you had a hysterectomy for a problem that was not cancer or not a condition that could lead to cancer, then you no longer need Pap tests. However, even if you no longer need a Pap test, a regular pelvic exam is a good idea to make sure no other problems are developing. Questions to ask your health care provider  Is a hysterectomy medically necessary? Do I have other treatment options for my condition?  What are my options for hysterectomy procedure?  What organs and tissues need to be removed?  What are the risks?  What are the benefits?  How long will I need to stay in the hospital after the procedure?  How long will I need to recover at home?  What symptoms can I expect after the procedure? Summary  A hysterectomy is a surgery in which the uterus is removed. The fallopian tubes and ovaries may be removed (bilateral salpingo-oophorectomy) as well.  This procedure may be done to treat various medical problems. After the procedure, a woman will no longer have menstrual periods nor will she be able to become pregnant.  Discuss the options with your health care provider to determine which type of hysterectomy is the right one for you. This information is not intended to replace advice given to you by your health care provider. Make sure you discuss any questions you have with your health care provider. Document Released: 12/25/2000 Document Revised: 08/07/2016 Document Reviewed: 08/07/2016 Elsevier Interactive Patient Education  2019 [employer].

## 2018-08-19 NOTE — Progress Notes (Signed)
History:  48 y.o. [id number] here today for eval for [place] with bilateral salpingectomy. LMP 08/03/2018 cycle lats 5 days. Heavy for 3 days. Pt denies bleeding between menses.  Pt reports very painful cycles. Pt is s/p [place] for fibroids 2015 but, now the fibroids have gotten larger and very painful.  Pt wants definitve tx. Pt works in a warehouse with lifting.      +tob 1ppd down to 8 cigs.  No recreation drugs. No ETOH.    The following portions of the patient's history were reviewed and updated as appropriate: allergies, current medications, past family history, past medical history, past social history, past surgical history and problem list.  Review of Systems:  Pertinent items are noted in HPI.    Objective:  Physical Exam Blood pressure (!) 131/95, pulse 79, weight 150 lb (68 kg), last menstrual period 08/04/2018.  CONSTITUTIONAL: Well-developed, well-nourished female in no acute distress.  HENT:  Normocephalic, atraumatic EYES: Conjunctivae and EOM are normal. No scleral icterus.  NECK: Normal range of motion SKIN: Skin is warm and dry. No rash noted. Not diaphoretic.No pallor. [place]: Alert and oriented to person, place, and time. Normal coordination.  Lungs: CTA CV: RRR Abd: Soft, nontender and nondistended Pelvic: Normal appearing external genitalia; normal appearing vaginal mucosa and cervix.  Normal discharge.  Small uterus, no other palpable masses, no uterine or adnexal tenderness  The indications for endometrial biopsy were reviewed.   Risks of the biopsy including cramping, bleeding, infection, uterine perforation, inadequate specimen and need for additional procedures  were discussed. The patient states she understands and agrees to undergo procedure today. Consent was signed. Time out was performed. Urine HCG was negative. A sterile speculum was placed in the patient's vagina and the cervix was prepped with Betadine. A single-toothed tenaculum was placed on the anterior lip  of the cervix to stabilize it. The 3 mm pipelle was introduced into the endometrial cavity without difficulty to a depth of 9cm, and a moderate amount of tissue was obtained and sent to pathology. The instruments were removed from the patient's vagina. Minimal bleeding from the cervix was noted. The patient tolerated the procedure well. Routine post-procedure instructions were given to the patient. The patient will follow up to review the results and for further management.     Labs and Imaging US Breast Ltd Uni Left Inc Axilla  Result Date: 08/13/2018 CLINICAL DATA:  Tender, painful mass felt by the patient in the lower inner left breast for the past week. There is associated redness increased warmth. She is not currently taking antibiotics. She reports that she had a palpable mass in the skin at that location prior to the onset of pain and redness. She reported that she repeatedly expressed brown fluid from the mass. EXAM: DIGITAL DIAGNOSTIC BILATERAL MAMMOGRAM WITH CAD AND TOMO ULTRASOUND LEFT BREAST COMPARISON:  Previous exam(s). ACR Breast Density Category c: The breast tissue is heterogeneously dense, which may obscure small masses. FINDINGS: Interval focal skin thickening with a convex outer margin in the lower inner left breast at the location of the mass felt by the patient, marked with a metallic marker. No underlying findings suspicious for malignancy and no evidence of malignancy elsewhere in either breast. Mammographic images were processed with CAD. On physical exam, the patient has an approximately 2.5 cm rounded, protruding, markedly tender palpable mass in the 8 o'clock position of the left breast, 5 cm from the nipple. There is associated skin redness and increased warmth. There are no palpable  left axillary lymph nodes. Targeted ultrasound is performed, showing a 2.0 x 1.6 x 1.0 cm irregular fluid collection internal echoes in the 8 o'clock position of the left breast, 5 cm from the nipple.  This is within an area of focal skin thickening and extending into the adjacent subcutaneous fat. There is prominent blood flow in the adjacent soft tissues with power Doppler. Ultrasound of the left axilla demonstrated 2 lymph nodes with moderate diffuse cortical thickening. The remainder of the lymph nodes have normal appearances. IMPRESSION: 1. 2.0 cm abscess within the skin and extending into the adjacent subcutaneous fat in the 8 o'clock position of the left breast. This most likely represents an infected epidermal inclusion cyst. 2. Two left axillary lymph nodes with moderate diffuse cortical thickening, most likely representing reactive nodes. RECOMMENDATION: 1. Ultrasound-guided aspiration of the abscess in the 8 o'clock position of the right breast. 2. Institution of appropriate antibiotics. 3. Follow-up left breast and left axillary ultrasound in 1 week. I have discussed the findings and recommendations with the patient. Results were also provided in writing at the conclusion of the visit. If applicable, a reminder letter will be sent to the patient regarding the next appointment. BI-RADS CATEGORY  3: Probably benign. Electronically Signed   By: Claudie Revering M.D.   On: 08/13/2018 13:42   Mm Diag Breast Tomo Bilateral  Result Date: 08/13/2018 CLINICAL DATA:  Tender, painful mass felt by the patient in the lower inner left breast for the past week. There is associated redness increased warmth. She is not currently taking antibiotics. She reports that she had a palpable mass in the skin at that location prior to the onset of pain and redness. She reported that she repeatedly expressed brown fluid from the mass. EXAM: DIGITAL DIAGNOSTIC BILATERAL MAMMOGRAM WITH CAD AND TOMO ULTRASOUND LEFT BREAST COMPARISON:  Previous exam(s). ACR Breast Density Category c: The breast tissue is heterogeneously dense, which may obscure small masses. FINDINGS: Interval focal skin thickening with a convex outer margin in the  lower inner left breast at the location of the mass felt by the patient, marked with a metallic marker. No underlying findings suspicious for malignancy and no evidence of malignancy elsewhere in either breast. Mammographic images were processed with CAD. On physical exam, the patient has an approximately 2.5 cm rounded, protruding, markedly tender palpable mass in the 8 o'clock position of the left breast, 5 cm from the nipple. There is associated skin redness and increased warmth. There are no palpable left axillary lymph nodes. Targeted ultrasound is performed, showing a 2.0 x 1.6 x 1.0 cm irregular fluid collection internal echoes in the 8 o'clock position of the left breast, 5 cm from the nipple. This is within an area of focal skin thickening and extending into the adjacent subcutaneous fat. There is prominent blood flow in the adjacent soft tissues with power Doppler. Ultrasound of the left axilla demonstrated 2 lymph nodes with moderate diffuse cortical thickening. The remainder of the lymph nodes have normal appearances. IMPRESSION: 1. 2.0 cm abscess within the skin and extending into the adjacent subcutaneous fat in the 8 o'clock position of the left breast. This most likely represents an infected epidermal inclusion cyst. 2. Two left axillary lymph nodes with moderate diffuse cortical thickening, most likely representing reactive nodes. RECOMMENDATION: 1. Ultrasound-guided aspiration of the abscess in the 8 o'clock position of the right breast. 2. Institution of appropriate antibiotics. 3. Follow-up left breast and left axillary ultrasound in 1 week. I have discussed  the findings and recommendations with the patient. Results were also provided in writing at the conclusion of the visit. If applicable, a reminder letter will be sent to the patient regarding the next appointment. BI-RADS CATEGORY  3: Probably benign. Electronically Signed   By: Claudie Revering M.D.   On: 08/13/2018 13:42   US Pelvic Complete  With Transvaginal  Result Date: 08/13/2018 CLINICAL DATA:  Uterine fibroids EXAM: TRANSABDOMINAL AND TRANSVAGINAL ULTRASOUND OF PELVIS TECHNIQUE: Both transabdominal and transvaginal ultrasound examinations of the pelvis were performed. Transabdominal technique was performed for global imaging of the pelvis including uterus, ovaries, adnexal regions, and pelvic cul-de-sac. It was necessary to proceed with endovaginal exam following the transabdominal exam to visualize the endometrium and ovaries. COMPARISON:  06/14/2014 FINDINGS: Uterus Measurements: 11.8 x 9.2 x 9.3 cm = volume: 530 mL. Enlarged with multiple uterine leiomyomata identified. Calcified exophytic fundal leiomyoma 4.6 x 3.5 x 3.7 cm. Anterior mid leiomyoma, subserosal, 3.3 x 2.1 x 3.6 cm. Posterior upper uterine leiomyoma 3.9 x 3.5 x 2.8 cm, subserosal. Multiple additional leiomyomata identified, some which are calcified and shadowing. Endometrium Thickness: Suboptimally visualized, single segment 4 mm thick identified. Some of the uterine leiomyomata extend submucosal. No endometrial fluid. Right ovary Not visualized on either transabdominal or endovaginal imaging, likely due to enlarged uterus and as well as being obscured by bowel Left ovary Not visualized on either transabdominal or endovaginal imaging, likely due to enlarged uterus as well as being obscured by bowel Other findings No abnormal free fluid.  No adnexal masses. IMPRESSION: Enlarged uterus containing numerous leiomyomata. Nonvisualization of ovaries. Electronically Signed   By: Lavonia Dana M.D.   On: 08/13/2018 17:21   US Breast Aspiration Left  Addendum Date: 08/13/2018   ADDENDUM REPORT: 08/13/2018 14:37 ADDENDUM: Diagnostic mammogram/ultrasound report from earlier today also described probably benign reactive lymph nodes in the LEFT axilla for which additional LEFT axillary ultrasound is recommended at the 1 week follow-up. Electronically Signed   By: Franki Cabot M.D.   On:  08/13/2018 14:37   Result Date: 08/13/2018 CLINICAL DATA:  Patient with LEFT breast abscess presents for ultrasound-guided aspiration. EXAM: ULTRASOUND GUIDED LEFT BREAST CYST ASPIRATION COMPARISON:  Previous exams. PROCEDURE: Using sterile technique, 1% lidocaine, under direct ultrasound visualization, needle aspiration of the LEFT breast abscess at the 8 o'clock axis was performed. Approximately 5 cc of pus and blood was removed and sent to the laboratory. Small amount of fluid persisted at the conclusion of the procedure. Patient could not tolerate further aspiration attempts. IMPRESSION: Ultrasound-guided aspiration of the superficial abscess in the LEFT breast at the 8 o'clock axis. No apparent complications. RECOMMENDATIONS: 1. Patient was provided with a 7 day course of doxycycline. 2. Patient will be scheduled for follow-up ultrasound in 1 week to exclude reaccumulation. Electronically Signed: By: Franki Cabot M.D. On: 08/13/2018 14:16    Assessment & Plan:   AUB with symptomatic fibroids. Reviewed the indications for hyst as prev reviewed with her by Dr. Roselie Awkward.     F/u surg path from endo bx  Patient desires surgical management with robot assisted total lap hyst with bilateral salpingectomy.   The risks of surgery were discussed in detail with the patient including but not limited to: bleeding which may require transfusion or reoperation; infection which may require prolonged hospitalization or re-hospitalization and antibiotic therapy; injury to bowel, bladder, ureters and major vessels or other surrounding organs; need for additional procedures including laparotomy; thromboembolic phenomenon, incisional problems and other postoperative or anesthesia  complications.  Patient was told that the likelihood that her condition and symptoms will be treated effectively with this surgical management was very high; the postoperative expectations were also discussed in detail. The patient also  understands the alternative treatment options which were discussed in full. All questions were answered.  She was told that she will be contacted by our surgical scheduler regarding the time and date of her surgery; routine preoperative instructions of having nothing to eat or drink after midnight on the day prior to surgery and also coming to the hospital 1 1/2 hours prior to her time of surgery were also emphasized.  She was told she may be called for a preoperative appointment about a week prior to surgery and will be given further preoperative instructions at that visit. Printed patient education handouts about the procedure were given to the patient to review at home.  Total face-to-face time with patient was 30 min.  Greater than 50% was spent in counseling and coordination of care with the patient.    Rand Etchison L. Harraway-Smith, M.D., Cherlynn June

## 2018-08-20 ENCOUNTER — Other Ambulatory Visit: Payer: Self-pay | Admitting: Obstetrics & Gynecology

## 2018-08-20 ENCOUNTER — Ambulatory Visit
Admission: RE | Admit: 2018-08-20 | Discharge: 2018-08-20 | Disposition: A | Discharge status: Home / Self Care | Payer: 59 | Source: Ambulatory Visit | Attending: Obstetrics & Gynecology | Admitting: Obstetrics & Gynecology

## 2018-08-20 DIAGNOSIS — N6320 Unspecified lump in the left breast, unspecified quadrant: Secondary | ICD-10-CM

## 2018-08-20 DIAGNOSIS — N611 Abscess of the breast and nipple: Secondary | ICD-10-CM

## 2018-08-21 ENCOUNTER — Encounter: Payer: Self-pay | Admitting: Obstetrics & Gynecology

## 2018-08-27 ENCOUNTER — Other Ambulatory Visit: Payer: 59

## 2018-08-27 ENCOUNTER — Inpatient Hospital Stay: Admission: RE | Admit: 2018-08-27 | Payer: 59 | Source: Ambulatory Visit

## 2018-09-01 ENCOUNTER — Telehealth: Payer: Self-pay

## 2018-09-01 NOTE — Telephone Encounter (Signed)
Called to advise Pt that  her surg path was negative. We will proceed with surgery as scheduled but no answer so LVM to call the office back for results.

## 2018-09-01 NOTE — Telephone Encounter (Signed)
-----   Message from Lavonia Drafts, MD sent at 08/31/2018  2:12 PM EST ----- Please call pt. Her surg path was negative. We will proceed with surgery as scheduled.   Thx, clh-S

## 2018-09-01 NOTE — Telephone Encounter (Signed)
Pt called back and I explained results and the follow up plan for surgery, patient verified understanding and had no questions.

## 2018-09-02 ENCOUNTER — Other Ambulatory Visit: Payer: 59

## 2018-09-03 ENCOUNTER — Ambulatory Visit
Admission: RE | Admit: 2018-09-03 | Discharge: 2018-09-03 | Disposition: A | Discharge status: Home / Self Care | Payer: 59 | Source: Ambulatory Visit | Attending: Obstetrics & Gynecology | Admitting: Obstetrics & Gynecology

## 2018-09-03 ENCOUNTER — Ambulatory Visit: Admission: RE | Admit: 2018-09-03 | Payer: 59 | Source: Ambulatory Visit

## 2018-09-03 DIAGNOSIS — N6320 Unspecified lump in the left breast, unspecified quadrant: Secondary | ICD-10-CM

## 2018-09-03 DIAGNOSIS — N611 Abscess of the breast and nipple: Secondary | ICD-10-CM

## 2018-09-07 NOTE — Progress Notes (Signed)
LOV DR Larene Beach BANKS 05-28-18 Epic

## 2018-09-07 NOTE — Patient Instructions (Addendum)
Carol Mack  09/07/2018      Your procedure is scheduled on 09-15-2018   Report to [facility]  at  5:30A.M.    Call this number if you have problems the morning of surgery:[phone]   OUR ADDRESS IS [address] [place], WE ARE LOCATED [facility] WITH [facility].   Remember:  NO SOLID FOOD AFTER MIDNIGHT THE NIGHT PRIOR TO SURGERY. NOTHING BY MOUTH EXCEPT CLEAR LIQUIDS UNTIL 3 HOURS PRIOR TO [place] SURGERY. PLEASE FINISH ENSURE DRINK PER SURGEON ORDER 3 HOURS PRIOR TO SCHEDULED SURGERY TIME WHICH NEEDS TO BE COMPLETED AT ____4:30AM________.      CLEAR LIQUID DIET   Foods Allowed                                                                     Foods Excluded  Coffee and tea, regular and decaf                             liquids that you cannot  Plain Jell-O in any flavor                                             see through such as: Fruit ices (not with fruit pulp)                                     milk, soups, orange juice  Iced Popsicles                                    All solid food Carbonated beverages, regular and diet                                    Cranberry, grape and apple juices Sports drinks like Gatorade Lightly seasoned clear broth or consume(fat free) Sugar, honey syrup  Sample Menu Breakfast                                Lunch                                     Supper Cranberry juice                    Beef broth                            Chicken broth Jell-O                                     Grape juice  Apple juice Coffee or tea                        Jell-O                                      Popsicle                                                Coffee or tea                        Coffee or tea  _____________________________________________________________________    Take these medicines the morning of surgery with A SIP OF WATER: AMLODIPINE, ALBUTEROL INHALER IF  NEEDED   Do not wear jewelry, make-up or nail polish.  Do not wear lotions, powders, or perfumes, or deoderant.  Do not shave 48 hours prior to surgery.  Men may shave face and neck.  Do not bring valuables to the hospital.  [facility] is not responsible for any belongings or valuables.  Contacts, dentures or bridgework may not be worn into surgery.  Leave your suitcase in the car.  After surgery it may be brought to your room.  For patients admitted to the hospital, discharge time will be determined by your treatment team.  Patients discharged the day of surgery will not be allowed to drive home.   Special instructions:  N/A  Please read over the following fact sheets that you were given:       [facility] - Preparing for Surgery Before surgery, you can play an important role.  Because skin is not sterile, your skin needs to be as free of germs as possible.  You can reduce the number of germs on your skin by washing with CHG (chlorahexidine gluconate) soap before surgery.  CHG is an antiseptic cleaner which kills germs and bonds with the skin to continue killing germs even after washing. Please DO NOT use if you have an allergy to CHG or antibacterial soaps.  If your skin becomes reddened/irritated stop using the CHG and inform your nurse when you arrive at Short Stay. Do not shave (including legs and underarms) for at least 48 hours prior to the first CHG shower.  You may shave your face/neck. Please follow these instructions carefully:  1.  Shower with CHG Soap the night before surgery and the  morning of Surgery.  2.  If you choose to wash your hair, wash your hair first as usual with your  normal  shampoo.  3.  After you shampoo, rinse your hair and body thoroughly to remove the  shampoo.                           4.  Use CHG as you would any other liquid soap.  You can apply chg directly  to the skin and wash                       Gently with a scrungie or clean washcloth.  5.   Apply the CHG Soap to your body ONLY FROM THE NECK DOWN.   Do not use on face/ open  Wound or open sores. Avoid contact with eyes, ears mouth and genitals (private parts).                       Wash face,  Genitals (private parts) with your normal soap.             6.  Wash thoroughly, paying special attention to the area where your surgery  will be performed.  7.  Thoroughly rinse your body with warm water from the neck down.  8.  DO NOT shower/wash with your normal soap after using and rinsing off  the CHG Soap.                9.  Pat yourself dry with a clean towel.            10.  Wear clean pajamas.            11.  Place clean sheets on your bed the night of your first shower and do not  sleep with pets. Day of Surgery : Do not apply any lotions/deodorants the morning of surgery.  Please wear clean clothes to the hospital/surgery center.  FAILURE TO FOLLOW THESE INSTRUCTIONS MAY RESULT IN THE CANCELLATION OF YOUR SURGERY PATIENT SIGNATURE_________________________________  NURSE SIGNATURE__________________________________  ________________________________________________________________________

## 2018-09-09 ENCOUNTER — Encounter (HOSPITAL_COMMUNITY): Payer: Self-pay

## 2018-09-09 ENCOUNTER — Other Ambulatory Visit: Payer: Self-pay

## 2018-09-09 ENCOUNTER — Encounter (INDEPENDENT_AMBULATORY_CARE_PROVIDER_SITE_OTHER): Payer: Self-pay

## 2018-09-09 ENCOUNTER — Encounter (HOSPITAL_COMMUNITY)
Admission: RE | Admit: 2018-09-09 | Discharge: 2018-09-09 | Disposition: A | Discharge status: Home / Self Care | Payer: 59 | Source: Ambulatory Visit | Attending: Obstetrics & Gynecology | Admitting: Obstetrics & Gynecology

## 2018-09-09 DIAGNOSIS — I10 Essential (primary) hypertension: Secondary | ICD-10-CM | POA: Insufficient documentation

## 2018-09-09 DIAGNOSIS — Z01818 Encounter for other preprocedural examination: Secondary | ICD-10-CM | POA: Insufficient documentation

## 2018-09-09 DIAGNOSIS — D259 Leiomyoma of uterus, unspecified: Secondary | ICD-10-CM | POA: Diagnosis not present

## 2018-09-09 DIAGNOSIS — N939 Abnormal uterine and vaginal bleeding, unspecified: Secondary | ICD-10-CM | POA: Diagnosis not present

## 2018-09-09 DIAGNOSIS — R9431 Abnormal electrocardiogram [ECG] [EKG]: Secondary | ICD-10-CM | POA: Diagnosis not present

## 2018-09-09 LAB — BASIC METABOLIC PANEL
Anion gap: 5 (ref 5–15)
BUN: 10 mg/dL (ref 6–20)
CO2: 27 mmol/L (ref 22–32)
Calcium: 9.2 mg/dL (ref 8.9–10.3)
Chloride: 105 mmol/L (ref 98–111)
Creatinine, Ser: 0.64 mg/dL (ref 0.44–1.00)
GFR calc Af Amer: >60 mL/min (ref >60)
GFR calc non Af Amer: >60 mL/min (ref >60)
Glucose, Bld: 94 mg/dL (ref 70–99)
Potassium: 3.7 mmol/L (ref 3.5–5.1)
Sodium: 137 mmol/L (ref 135–145)

## 2018-09-09 LAB — CBC
HCT: 42.5 % (ref 36.0–46.0)
Hemoglobin: 14.5 g/dL (ref 12.0–15.0)
MCH: 25.4 pg — ABNORMAL LOW (ref 26.0–34.0)
MCHC: 34.1 g/dL (ref 30.0–36.0)
MCV: 74.6 fL — ABNORMAL LOW (ref 80.0–100.0)
Platelets: 246 10*3/uL (ref 150–400)
RBC: 5.7 MIL/uL — ABNORMAL HIGH (ref 3.87–5.11)
RDW: 14.5 % (ref 11.5–15.5)
WBC: 6.7 10*3/uL (ref 4.0–10.5)
nRBC: 0 % (ref 0.0–0.2)

## 2018-09-09 LAB — ABO/RH: ABO/RH(D): O POS

## 2018-09-15 ENCOUNTER — Observation Stay (HOSPITAL_BASED_OUTPATIENT_CLINIC_OR_DEPARTMENT_OTHER): Payer: 59 | Admitting: Anesthesiology

## 2018-09-15 ENCOUNTER — Observation Stay (HOSPITAL_BASED_OUTPATIENT_CLINIC_OR_DEPARTMENT_OTHER): Payer: 59 | Admitting: Physician Assistant

## 2018-09-15 ENCOUNTER — Encounter (HOSPITAL_BASED_OUTPATIENT_CLINIC_OR_DEPARTMENT_OTHER): Payer: Self-pay | Admitting: Emergency Medicine

## 2018-09-15 ENCOUNTER — Other Ambulatory Visit: Payer: Self-pay

## 2018-09-15 ENCOUNTER — Ambulatory Visit (HOSPITAL_BASED_OUTPATIENT_CLINIC_OR_DEPARTMENT_OTHER)
Admission: RE | Admit: 2018-09-15 | Discharge: 2018-09-15 | Disposition: A | Discharge status: Home / Self Care | Payer: 59 | Attending: Obstetrics & Gynecology | Admitting: Obstetrics & Gynecology

## 2018-09-15 ENCOUNTER — Encounter (HOSPITAL_BASED_OUTPATIENT_CLINIC_OR_DEPARTMENT_OTHER)
Admission: RE | Disposition: A | Discharge status: Home / Self Care | Payer: Self-pay | Source: Home / Self Care | Attending: Obstetrics & Gynecology

## 2018-09-15 DIAGNOSIS — N800 Endometriosis of uterus: Secondary | ICD-10-CM | POA: Insufficient documentation

## 2018-09-15 DIAGNOSIS — Z90710 Acquired absence of both cervix and uterus: Secondary | ICD-10-CM | POA: Insufficient documentation

## 2018-09-15 DIAGNOSIS — N7011 Chronic salpingitis: Secondary | ICD-10-CM | POA: Insufficient documentation

## 2018-09-15 DIAGNOSIS — D259 Leiomyoma of uterus, unspecified: Secondary | ICD-10-CM | POA: Insufficient documentation

## 2018-09-15 DIAGNOSIS — E042 Nontoxic multinodular goiter: Secondary | ICD-10-CM | POA: Diagnosis present

## 2018-09-15 DIAGNOSIS — K660 Peritoneal adhesions (postprocedural) (postinfection): Secondary | ICD-10-CM | POA: Insufficient documentation

## 2018-09-15 DIAGNOSIS — N72 Inflammatory disease of cervix uteri: Secondary | ICD-10-CM | POA: Diagnosis not present

## 2018-09-15 DIAGNOSIS — N888 Other specified noninflammatory disorders of cervix uteri: Secondary | ICD-10-CM | POA: Diagnosis not present

## 2018-09-15 DIAGNOSIS — I10 Essential (primary) hypertension: Secondary | ICD-10-CM | POA: Diagnosis present

## 2018-09-15 DIAGNOSIS — D251 Intramural leiomyoma of uterus: Secondary | ICD-10-CM

## 2018-09-15 DIAGNOSIS — F17210 Nicotine dependence, cigarettes, uncomplicated: Secondary | ICD-10-CM | POA: Diagnosis present

## 2018-09-15 DIAGNOSIS — J45909 Unspecified asthma, uncomplicated: Secondary | ICD-10-CM | POA: Diagnosis not present

## 2018-09-15 DIAGNOSIS — D252 Subserosal leiomyoma of uterus: Secondary | ICD-10-CM

## 2018-09-15 DIAGNOSIS — E0590 Thyrotoxicosis, unspecified without thyrotoxic crisis or storm: Secondary | ICD-10-CM | POA: Diagnosis present

## 2018-09-15 DIAGNOSIS — N920 Excessive and frequent menstruation with regular cycle: Secondary | ICD-10-CM | POA: Diagnosis present

## 2018-09-15 DIAGNOSIS — D219 Benign neoplasm of connective and other soft tissue, unspecified: Secondary | ICD-10-CM | POA: Diagnosis present

## 2018-09-15 HISTORY — PX: ROBOTIC ASSISTED TOTAL HYSTERECTOMY: SHX6085

## 2018-09-15 LAB — TYPE AND SCREEN
ABO/RH(D): O POS
Antibody Screen: NEGATIVE

## 2018-09-15 LAB — POCT PREGNANCY, URINE: Preg Test, Ur: NEGATIVE

## 2018-09-15 SURGERY — HYSTERECTOMY, TOTAL, ROBOT-ASSISTED
Anesthesia: General

## 2018-09-15 MED ORDER — HYDROMORPHONE HCL 1 MG/ML IJ SOLN
0.2000 mg | INTRAMUSCULAR | Status: DC | PRN
  Filled 2018-09-15: qty 1

## 2018-09-15 MED ORDER — DEXAMETHASONE SODIUM PHOSPHATE 4 MG/ML IJ SOLN
INTRAMUSCULAR | Status: DC | PRN
  Administered 2018-09-15: 10 mg via INTRAVENOUS

## 2018-09-15 MED ORDER — KETOROLAC TROMETHAMINE 30 MG/ML IJ SOLN
30.0000 mg | Freq: Once | INTRAMUSCULAR | Status: DC | PRN
  Filled 2018-09-15: qty 1

## 2018-09-15 MED ORDER — HYDROCODONE-ACETAMINOPHEN 5-325 MG PO TABS: 1.0000 | ORAL_TABLET | Freq: Four times a day (QID) | ORAL | 0 refills | Status: DC | PRN

## 2018-09-15 MED ORDER — BISACODYL 10 MG RE SUPP
10.0000 mg | Freq: Every day | RECTAL | Status: DC | PRN
  Filled 2018-09-15: qty 1

## 2018-09-15 MED ORDER — DEXAMETHASONE SODIUM PHOSPHATE 10 MG/ML IJ SOLN
INTRAMUSCULAR | Status: AC
  Filled 2018-09-15: qty 1

## 2018-09-15 MED ORDER — KETOROLAC TROMETHAMINE 15 MG/ML IJ SOLN
15.0000 mg | INTRAMUSCULAR | Status: DC
  Filled 2018-09-15: qty 1

## 2018-09-15 MED ORDER — KETOROLAC TROMETHAMINE 30 MG/ML IJ SOLN
INTRAMUSCULAR | Status: DC | PRN
  Administered 2018-09-15: 30 mg via INTRAVENOUS

## 2018-09-15 MED ORDER — SODIUM CHLORIDE 0.9 % IR SOLN
Status: DC | PRN
  Administered 2018-09-15: 1000 mL
  Administered 2018-09-15: 1000 mL via INTRAVESICAL

## 2018-09-15 MED ORDER — KETOROLAC TROMETHAMINE 30 MG/ML IJ SOLN
INTRAMUSCULAR | Status: AC
  Filled 2018-09-15: qty 1

## 2018-09-15 MED ORDER — POLYETHYLENE GLYCOL 3350 17 G PO PACK
17.0000 g | PACK | Freq: Every day | ORAL | Status: DC | PRN
  Filled 2018-09-15: qty 1

## 2018-09-15 MED ORDER — MIDAZOLAM HCL 2 MG/2ML IJ SOLN
INTRAMUSCULAR | Status: AC
  Filled 2018-09-15: qty 2

## 2018-09-15 MED ORDER — HYDROCODONE-ACETAMINOPHEN 5-325 MG PO TABS
ORAL_TABLET | ORAL | Status: AC
  Filled 2018-09-15: qty 2

## 2018-09-15 MED ORDER — LIDOCAINE HCL (CARDIAC) PF 100 MG/5ML IV SOSY
PREFILLED_SYRINGE | INTRAVENOUS | Status: DC | PRN
  Administered 2018-09-15: 100 mg via INTRAVENOUS

## 2018-09-15 MED ORDER — SUGAMMADEX SODIUM 200 MG/2ML IV SOLN
INTRAVENOUS | Status: AC
  Filled 2018-09-15: qty 2

## 2018-09-15 MED ORDER — DOCUSATE SODIUM 100 MG PO CAPS: 100.0000 mg | ORAL_CAPSULE | Freq: Two times a day (BID) | ORAL | 0 refills | Status: DC | PRN

## 2018-09-15 MED ORDER — CEFAZOLIN SODIUM-DEXTROSE 2-4 GM/100ML-% IV SOLN
2.0000 g | INTRAVENOUS | Status: AC
  Administered 2018-09-15: 2 g via INTRAVENOUS
  Filled 2018-09-15: qty 100

## 2018-09-15 MED ORDER — FENTANYL CITRATE (PF) 250 MCG/5ML IJ SOLN
INTRAMUSCULAR | Status: AC
  Filled 2018-09-15: qty 5

## 2018-09-15 MED ORDER — ONDANSETRON HCL 4 MG/2ML IJ SOLN
INTRAMUSCULAR | Status: AC
  Filled 2018-09-15: qty 2

## 2018-09-15 MED ORDER — LIDOCAINE 2% (20 MG/ML) 5 ML SYRINGE
INTRAMUSCULAR | Status: AC
  Filled 2018-09-15: qty 5

## 2018-09-15 MED ORDER — PROPOFOL 10 MG/ML IV BOLUS
INTRAVENOUS | Status: DC | PRN
  Administered 2018-09-15: 160 mg via INTRAVENOUS

## 2018-09-15 MED ORDER — PANTOPRAZOLE SODIUM 40 MG PO TBEC
40.0000 mg | DELAYED_RELEASE_TABLET | Freq: Every day | ORAL | Status: DC
  Administered 2018-09-15: 40 mg via ORAL
  Filled 2018-09-15 (×2): qty 1

## 2018-09-15 MED ORDER — IBUPROFEN 800 MG PO TABS: 800.0000 mg | ORAL_TABLET | Freq: Three times a day (TID) | ORAL | 0 refills | Status: DC | PRN

## 2018-09-15 MED ORDER — ONDANSETRON HCL 4 MG/2ML IJ SOLN
4.0000 mg | Freq: Four times a day (QID) | INTRAMUSCULAR | Status: DC | PRN
  Filled 2018-09-15: qty 2

## 2018-09-15 MED ORDER — ONDANSETRON HCL 4 MG/2ML IJ SOLN
INTRAMUSCULAR | Status: DC | PRN
  Administered 2018-09-15: 4 mg via INTRAVENOUS

## 2018-09-15 MED ORDER — DEXTROSE-NACL 5-0.45 % IV SOLN
INTRAVENOUS | Status: DC
  Filled 2018-09-15: qty 1000

## 2018-09-15 MED ORDER — MIDAZOLAM HCL 5 MG/5ML IJ SOLN
INTRAMUSCULAR | Status: DC | PRN
  Administered 2018-09-15: 2 mg via INTRAVENOUS

## 2018-09-15 MED ORDER — SUGAMMADEX SODIUM 200 MG/2ML IV SOLN
INTRAVENOUS | Status: DC | PRN
  Administered 2018-09-15: 140 mg via INTRAVENOUS

## 2018-09-15 MED ORDER — AMLODIPINE BESYLATE 5 MG PO TABS
5.0000 mg | ORAL_TABLET | Freq: Every day | ORAL | Status: DC
  Filled 2018-09-15 (×2): qty 1

## 2018-09-15 MED ORDER — HYDROMORPHONE HCL 1 MG/ML IJ SOLN
0.2500 mg | INTRAMUSCULAR | Status: DC | PRN
  Filled 2018-09-15: qty 0.5

## 2018-09-15 MED ORDER — GABAPENTIN 300 MG PO CAPS
300.0000 mg | ORAL_CAPSULE | ORAL | Status: AC
  Administered 2018-09-15: 300 mg via ORAL
  Filled 2018-09-15: qty 1

## 2018-09-15 MED ORDER — SIMETHICONE 80 MG PO CHEW
80.0000 mg | CHEWABLE_TABLET | Freq: Four times a day (QID) | ORAL | Status: DC | PRN
  Filled 2018-09-15: qty 1

## 2018-09-15 MED ORDER — HYDROCODONE-ACETAMINOPHEN 5-325 MG PO TABS
1.0000 | ORAL_TABLET | ORAL | Status: DC | PRN
  Administered 2018-09-15: 2 via ORAL
  Filled 2018-09-15: qty 2

## 2018-09-15 MED ORDER — DOCUSATE SODIUM 100 MG PO CAPS
100.0000 mg | ORAL_CAPSULE | Freq: Two times a day (BID) | ORAL | Status: DC
  Filled 2018-09-15: qty 1

## 2018-09-15 MED ORDER — LACTATED RINGERS IV SOLN
INTRAVENOUS | Status: DC
  Administered 2018-09-15: 06:00:00 via INTRAVENOUS
  Filled 2018-09-15 (×2): qty 1000

## 2018-09-15 MED ORDER — ONDANSETRON 4 MG PO TBDP
ORAL_TABLET | ORAL | Status: AC
  Filled 2018-09-15: qty 1

## 2018-09-15 MED ORDER — ZOLPIDEM TARTRATE 5 MG PO TABS
5.0000 mg | ORAL_TABLET | Freq: Every evening | ORAL | Status: DC | PRN
  Filled 2018-09-15: qty 1

## 2018-09-15 MED ORDER — PROPOFOL 10 MG/ML IV BOLUS
INTRAVENOUS | Status: AC
  Filled 2018-09-15: qty 40

## 2018-09-15 MED ORDER — ROCURONIUM BROMIDE 100 MG/10ML IV SOLN
INTRAVENOUS | Status: DC | PRN
  Administered 2018-09-15: 60 mg via INTRAVENOUS
  Administered 2018-09-15: 10 mg via INTRAVENOUS
  Administered 2018-09-15: 20 mg via INTRAVENOUS

## 2018-09-15 MED ORDER — PROMETHAZINE HCL 25 MG/ML IJ SOLN
6.2500 mg | INTRAMUSCULAR | Status: DC | PRN
  Administered 2018-09-15: 6.25 mg via INTRAVENOUS
  Filled 2018-09-15: qty 1

## 2018-09-15 MED ORDER — FENTANYL CITRATE (PF) 100 MCG/2ML IJ SOLN
INTRAMUSCULAR | Status: DC | PRN
  Administered 2018-09-15: 100 ug via INTRAVENOUS
  Administered 2018-09-15: 25 ug via INTRAVENOUS
  Administered 2018-09-15: 50 ug via INTRAVENOUS
  Administered 2018-09-15: 75 ug via INTRAVENOUS

## 2018-09-15 MED ORDER — BUPIVACAINE HCL (PF) 0.5 % IJ SOLN
INTRAMUSCULAR | Status: DC | PRN
  Administered 2018-09-15: 30 mL

## 2018-09-15 MED ORDER — FLUORESCEIN SODIUM 10 % IV SOLN
INTRAVENOUS | Status: DC | PRN
  Administered 2018-09-15: .5 mL via INTRAVENOUS

## 2018-09-15 MED ORDER — PROMETHAZINE HCL 25 MG/ML IJ SOLN
INTRAMUSCULAR | Status: AC
  Filled 2018-09-15: qty 1

## 2018-09-15 MED ORDER — ONDANSETRON HCL 4 MG PO TABS
4.0000 mg | ORAL_TABLET | Freq: Four times a day (QID) | ORAL | Status: DC | PRN
  Administered 2018-09-15: 4 mg via ORAL
  Filled 2018-09-15: qty 1

## 2018-09-15 MED ORDER — LACTATED RINGERS IV SOLN
INTRAVENOUS | Status: DC
  Filled 2018-09-15: qty 1000

## 2018-09-15 MED ORDER — GABAPENTIN 300 MG PO CAPS
ORAL_CAPSULE | ORAL | Status: AC
  Filled 2018-09-15: qty 1

## 2018-09-15 MED ORDER — CEFAZOLIN SODIUM-DEXTROSE 2-4 GM/100ML-% IV SOLN
INTRAVENOUS | Status: AC
  Filled 2018-09-15: qty 100

## 2018-09-15 SURGICAL SUPPLY — 86 items
ADH SKN CLS APL DERMABOND .7 (GAUZE/BANDAGES/DRESSINGS) ×1
APL SRG 38 LTWT LNG FL B (MISCELLANEOUS) ×1
APPLICATOR ARISTA FLEXITIP XL (MISCELLANEOUS) ×1 IMPLANT
APPLIER CLIP 5 13 M/L LIGAMAX5 (MISCELLANEOUS)
APR CLP MED LRG 5 ANG JAW (MISCELLANEOUS)
BARRIER ADHS 3X4 INTERCEED (GAUZE/BANDAGES/DRESSINGS) IMPLANT
BRR ADH 4X3 ABS CNTRL BYND (GAUZE/BANDAGES/DRESSINGS)
CANISTER SUCT 3000ML PPV (MISCELLANEOUS) ×2 IMPLANT
CATH FOLEY 3WAY  5CC 16FR (CATHETERS) ×1
CATH FOLEY 3WAY 5CC 16FR (CATHETERS) ×1 IMPLANT
CLIP APPLIE 5 13 M/L LIGAMAX5 (MISCELLANEOUS) IMPLANT
COVER BACK TABLE 60X90IN (DRAPES) ×2 IMPLANT
COVER TIP SHEARS 8 DVNC (MISCELLANEOUS) ×1 IMPLANT
COVER TIP SHEARS 8MM DA VINCI (MISCELLANEOUS) ×1
DECANTER SPIKE VIAL GLASS SM (MISCELLANEOUS) ×2 IMPLANT
DEFOGGER SCOPE WARMER CLEARIFY (MISCELLANEOUS) ×2 IMPLANT
DERMABOND ADVANCED (GAUZE/BANDAGES/DRESSINGS) ×1
DERMABOND ADVANCED .7 DNX12 (GAUZE/BANDAGES/DRESSINGS) ×1 IMPLANT
DRAPE ARM DVNC X/XI (DISPOSABLE) ×3 IMPLANT
DRAPE COLUMN DVNC XI (DISPOSABLE) ×1 IMPLANT
DRAPE DA VINCI XI ARM (DISPOSABLE) ×3
DRAPE DA VINCI XI COLUMN (DISPOSABLE) ×1
DURAPREP 26ML APPLICATOR (WOUND CARE) ×2 IMPLANT
ELECT REM PT RETURN 9FT ADLT (ELECTROSURGICAL) ×2
ELECTRODE REM PT RTRN 9FT ADLT (ELECTROSURGICAL) ×1 IMPLANT
GAUZE SPONGE 4X4 12PLY STRL (GAUZE/BANDAGES/DRESSINGS) ×1 IMPLANT
GLOVE BIO SURGEON STRL SZ 6 (GLOVE) ×1 IMPLANT
GLOVE BIO SURGEON STRL SZ7 (GLOVE) ×4 IMPLANT
GLOVE BIOGEL PI IND STRL 6.5 (GLOVE) IMPLANT
GLOVE BIOGEL PI IND STRL 7.0 (GLOVE) ×5 IMPLANT
GLOVE BIOGEL PI IND STRL 7.5 (GLOVE) IMPLANT
GLOVE BIOGEL PI IND STRL 8.5 (GLOVE) IMPLANT
GLOVE BIOGEL PI INDICATOR 6.5 (GLOVE) ×1
GLOVE BIOGEL PI INDICATOR 7.0 (GLOVE) ×6
GLOVE BIOGEL PI INDICATOR 7.5 (GLOVE) ×1
GLOVE BIOGEL PI INDICATOR 8.5 (GLOVE) ×2
GLOVE ECLIPSE 7.0 STRL STRAW (GLOVE) ×1 IMPLANT
GLOVE INDICATOR 8.5 STRL (GLOVE) ×2 IMPLANT
GLOVE NEODERM STER SZ 7 (GLOVE) ×1 IMPLANT
GYRUS RUMI II 2.5CM BLUE (DISPOSABLE)
GYRUS RUMI II 3.5CM BLUE (DISPOSABLE)
GYRUS RUMI II 4.0CM BLUE (DISPOSABLE)
HEMOSTAT ARISTA ABSORB 3G PWDR (HEMOSTASIS) ×1 IMPLANT
IRRIG SUCT STRYKERFLOW 2 WTIP (MISCELLANEOUS) ×2
IRRIGATION SUCT STRKRFLW 2 WTP (MISCELLANEOUS) ×1 IMPLANT
LEGGING LITHOTOMY PAIR STRL (DRAPES) ×2 IMPLANT
NEEDLE INSUFFLATION 120MM (ENDOMECHANICALS) ×2 IMPLANT
OBTURATOR OPTICAL STANDARD 8MM (TROCAR) ×1
OBTURATOR OPTICAL STND 8 DVNC (TROCAR) ×1
OBTURATOR OPTICALSTD 8 DVNC (TROCAR) ×1 IMPLANT
OCCLUDER COLPOPNEUMO (BALLOONS) ×2 IMPLANT
PACK ROBOT WH (CUSTOM PROCEDURE TRAY) ×2 IMPLANT
PACK ROBOTIC GOWN (GOWN DISPOSABLE) ×2 IMPLANT
PACK TRENDGUARD 450 HYBRID PRO (MISCELLANEOUS) IMPLANT
PAD PREP 24X48 CUFFED NSTRL (MISCELLANEOUS) ×2 IMPLANT
PROTECTOR NERVE ULNAR (MISCELLANEOUS) ×4 IMPLANT
RUMI II 3.0CM BLUE KOH-EFFICIE (DISPOSABLE) IMPLANT
RUMI II GYRUS 2.5CM BLUE (DISPOSABLE) IMPLANT
RUMI II GYRUS 3.5CM BLUE (DISPOSABLE) IMPLANT
RUMI II GYRUS 4.0CM BLUE (DISPOSABLE) IMPLANT
SEAL CANN UNIV 5-8 DVNC XI (MISCELLANEOUS) ×3 IMPLANT
SEAL XI 5MM-8MM UNIVERSAL (MISCELLANEOUS) ×3
SEALER VESSEL DA VINCI XI (MISCELLANEOUS) ×1
SEALER VESSEL EXT DVNC XI (MISCELLANEOUS) ×1 IMPLANT
SET CYSTO W/LG BORE CLAMP LF (SET/KITS/TRAYS/PACK) IMPLANT
SET IRRIG Y TYPE TUR BLADDER L (SET/KITS/TRAYS/PACK) ×1 IMPLANT
SET TRI-LUMEN FLTR TB AIRSEAL (TUBING) ×2 IMPLANT
SPONGE LAP 4X18 RFD (DISPOSABLE) ×1 IMPLANT
SUT VIC AB 0 CT1 27 (SUTURE) ×6
SUT VIC AB 0 CT1 27XBRD ANBCTR (SUTURE) ×2 IMPLANT
SUT VICRYL 0 UR6 27IN ABS (SUTURE) ×1 IMPLANT
SUT VICRYL 4-0 PS2 18IN ABS (SUTURE) ×4 IMPLANT
SUT VLOC 180 0 9IN  GS21 (SUTURE) ×1
SUT VLOC 180 0 9IN GS21 (SUTURE) ×1 IMPLANT
SYSTEM CARTER THOMASON II (TROCAR) IMPLANT
TIP RUMI ORANGE 6.7MMX12CM (TIP) IMPLANT
TIP UTERINE 5.1X6CM LAV DISP (MISCELLANEOUS) IMPLANT
TIP UTERINE 6.7X10CM GRN DISP (MISCELLANEOUS) ×1 IMPLANT
TIP UTERINE 6.7X6CM WHT DISP (MISCELLANEOUS) IMPLANT
TIP UTERINE 6.7X8CM BLUE DISP (MISCELLANEOUS) IMPLANT
TOWEL OR 17X24 6PK STRL BLUE (TOWEL DISPOSABLE) ×1 IMPLANT
TOWEL OR 17X26 10 PK STRL BLUE (TOWEL DISPOSABLE) ×4 IMPLANT
TRAY FOLEY W/BAG SLVR 14FR (SET/KITS/TRAYS/PACK) ×1 IMPLANT
TRENDGUARD 450 HYBRID PRO PACK (MISCELLANEOUS)
TROCAR PORT AIRSEAL 5X120 (TROCAR) ×3 IMPLANT
WATER STERILE IRR 1000ML POUR (IV SOLUTION) ×2 IMPLANT

## 2018-09-15 NOTE — Anesthesia Postprocedure Evaluation (Signed)
Anesthesia Post Note  Patient: Carol Mack  Procedure(s) Performed: XI ROBOTIC ASSISTED TOTAL HYSTERECTOMY WITH SALPINGECTOMY (N/A )     Patient location during evaluation: PACU Anesthesia Type: General Level of consciousness: awake and alert Pain management: pain level controlled Vital Signs Assessment: post-procedure vital signs reviewed and stable Respiratory status: spontaneous breathing, nonlabored ventilation, respiratory function stable and patient connected to nasal cannula oxygen Cardiovascular status: blood pressure returned to baseline and stable Postop Assessment: no apparent nausea or vomiting Anesthetic complications: no    Last Vitals:  Vitals:   09/15/18 1145 09/15/18 1152  BP: 117/78   Pulse: 64 64  Resp: (!) 32 19  Temp:  (!) 36.2 C  SpO2: 95% 91%    Last Pain:  Vitals:   09/15/18 0537  TempSrc: Oral  PainSc: 0-No pain                 Angeliki Mates S

## 2018-09-15 NOTE — Op Note (Signed)
09/15/2018  11:28 AM  PATIENT:  Carol Mack  48 y.o. female  PRE-OPERATIVE DIAGNOSIS:  Fibroids  POST-OPERATIVE DIAGNOSIS:  Fibroids  PROCEDURE:  Procedure(s): XI ROBOTIC ASSISTED TOTAL HYSTERECTOMY WITH SALPINGECTOMY (N/A)  SURGEON:  Surgeon(s) and Role:    * Lavonia Drafts, MD - Primary    * Constant, Peggy, MD - Assisting  ANESTHESIA:   local and general  EBL:  50 mL   BLOOD ADMINISTERED:none  DRAINS: none   LOCAL MEDICATIONS USED:  MARCAINE     SPECIMEN:  Source of Specimen:  uterus, cervix and fallopian tubes   DISPOSITION OF SPECIMEN:  PATHOLOGY  COUNTS:  YES  TOURNIQUET:  * No tourniquets in log *  DICTATION: .Note written in EPIC  PLAN OF CARE: discharge to home after 6 hours observation   PATIENT DISPOSITION:  PACU - hemodynamically stable.   Delay start of Pharmacological VTE agent (>24hrs) due to surgical blood loss or risk of bleeding: not applicable  Complications: none immediate   Pt is a 48 yo P2 with h/o [place] who presents for [facility] with bilateral salpingectomy for uterine fibroids and AUB. The risks, benefits, and alternatives of surgery were explained, understood, and accepted. Consents were signed. All questions were answered. She was taken to the operating room and general anesthesia was applied without complication. She was placed in the dorsal lithotomy position and her abdomen and vagina were prepped and draped after she had been carefully positioned on the table. A bimanual exam revealed a 14 week size uterus that was mobile. Her adnexa were not enlarged. The cervix was measured and the uterus was sounded to 10 cm. A Rumi uterine manipulator was placed without difficulty. A Foley catheter was placed and it drained clear throughout the case. Gloves were changed and attention was turned to the abdomen. A 14mm incision was made in the umbilicus and a Veress needle was placed intraperitoneally. CO2 was used to insufflate the abdomen to  approximately 4 L. After good pneumoperitoneum was established, a 5 mm trocar was placed in the umbilicus.  Laparoscopy confirmed correct placement. She was placed in Trendelenburg position and ports were placed in appropriate positions on her abdomen to allow maximum exposure during the robotic case. Specifically there was an 12mm assistant port placed in the left lower quadrant under direct laproscopic visualization. Two 8 mm ports were placed 8cm lateral to the midline port.  These were all placed under direct laparoscopic visualization. The robot was docked and I proceeded with a robotic portion of the case.  The pelvis was inspected and the uterus was found to have fibroids and be enlarged.  The fallopian tubes and ovaries were found to be normal. The remainder of her pelvis appeared normal with the exception of adhesions of bowel to the adnexa and sidewall on the left side.  These were released sharply. The ureters and the infundibulopelvic ligaments were identified. I excised the fallopian tubes bilaterally. The round ligament on each side was cauterized and cut. The Vessel sealer instrument was used for this portion. The round ligaments were identified, cauterized and ligated, a bladder flap was created anteriorly. The uterine vessels were identified and cauterized and then cut.The bladder was pushed out of the operative site and an anterior colpotomy was made. The colpotomy incision was extended circumferentially, following the blue outline of the Rumi manipulator. All pedicles were hemostatic.  In attempting to remove the uterus the Rumi was pulled off so I left the console to locate the cervix while the  surgical assistant watched from above. The cervix was grasped and the uterus was removed from the vagina with the fallopian tube segments. The vaginal cuff was closed with v-lock suture.  Excellent hemostasis was noted throughout. The pelvis was irrigated. The intraabdominal pressure was lowered assess  hemostasis. After determining excellent hemostasis, the robot was undocked. At this point I performed cystoscopy. The cystoscopy revealed ejection from both ureters.  The midline fascial incision was closed with 0 vicryl.  The skin from all of the other ports was closed with 3-0 vicryl. 30cc of 0.5% Marcaine was injected into the port sites.  The patient was then extubated and taken to recovery in stable condition.   Sponge, lap and needle counts were correct x 2.  An experienced assistant was required given the standard of surgical care given the complexity of the case.  This assistant was needed for exposure, dissection, suctioning, retraction, instrument exchange, assisting with delivery with administration of fundal pressure, and for overall help during the procedure.  Carol Mack, M.D., Cherlynn June

## 2018-09-15 NOTE — Discharge Instructions (Signed)
Post Anesthesia Home Care Instructions  Activity: Get plenty of rest for the remainder of the day. A responsible individual must stay with you for 24 hours following the procedure.  For the next 24 hours, DO NOT: -Drive a car -Paediatric nurse -Drink alcoholic beverages -Take any medication unless instructed by your physician -Make any legal decisions or sign important papers.  Meals: Start with liquid foods such as gelatin or soup. Progress to regular foods as tolerated. Avoid greasy, spicy, heavy foods. If nausea and/or vomiting occur, drink only clear liquids until the nausea and/or vomiting subsides. Call your physician if vomiting continues.  Special Instructions/Symptoms: Your throat may feel dry or sore from the anesthesia or the breathing tube placed in your throat during surgery. If this causes discomfort, gargle with warm salt water. The discomfort should disappear within 24 hours.  If you had a scopolamine patch placed behind your ear for the management of post- operative nausea and/or vomiting:  1. The medication in the patch is effective for 72 hours, after which it should be removed.  Wrap patch in a tissue and discard in the trash. Wash hands thoroughly with soap and water. 2. You may remove the patch earlier than 72 hours if you experience unpleasant side effects which may include dry mouth, dizziness or visual disturbances. 3. Avoid touching the patch. Wash your hands with soap and water after contact with the patch.    Total Laparoscopic Hysterectomy, Care After This sheet gives you information about how to care for yourself after your procedure. Your health care provider may also give you more specific instructions. If you have problems or questions, contact your health care provider. What can I expect after the procedure? After the procedure, it is common to have:  Pain and bruising around your incisions.  A sore throat, if a breathing tube was used during  surgery.  Fatigue.  Poor appetite.  Less interest in sex. If your ovaries were also removed, it is also common to have symptoms of menopause such as hot flashes, night sweats, and lack of sleep (insomnia). Follow these instructions at home: Bathing  Do not take baths, swim, or use a hot tub until your health care provider approves. You may need to only take showers for 2-3 weeks.  Keep your bandage (dressing) dry until your health care provider says it can be removed. Incision care   Follow instructions from your health care provider about how to take care of your incisions. Make sure you: ? Wash your hands with soap and water before you change your dressing. If soap and water are not available, use hand sanitizer. ? Change your dressing as told by your health care provider. ? Leave stitches (sutures), skin glue, or adhesive strips in place. These skin closures may need to stay in place for 2 weeks or longer. If adhesive strip edges start to loosen and curl up, you may trim the loose edges. Do not remove adhesive strips completely unless your health care provider tells you to do that.  Check your incision area every day for signs of infection. Check for: ? Redness, swelling, or pain. ? Fluid or blood. ? Warmth. ? Pus or a bad smell. Activity  Get plenty of rest and sleep.  Do not lift anything that is heavier than 10 lbs (4.5 kg) for one month after surgery, or as long as told by your health care provider.  Do not drive or use heavy machinery while taking prescription pain medicine.  Do  not drive for 24 hours if you were given a medicine to help you relax (sedative).  Return to your normal activities as told by your health care provider. Ask your health care provider what activities are safe for you. Lifestyle   Do not use any products that contain nicotine or tobacco, such as cigarettes and e-cigarettes. These can delay healing. If you need help quitting, ask your health  care provider.  Do not drink alcohol until your health care provider approves. General instructions  Do not douche, use tampons, or have sex for at least 6 weeks, or as told by your health care provider.  Take over-the-counter and prescription medicines only as told by your health care provider.  To monitor yourself for a fever, take your temperature at least once a day during recovery.  If you struggle with physical or emotional changes after your procedure, speak with your health care provider or a therapist.  To prevent or treat constipation while you are taking prescription pain medicine, your health care provider may recommend that you: ? Drink enough fluid to keep your urine clear or pale yellow. ? Take over-the-counter or prescription medicines. ? Eat foods that are high in fiber, such as fresh fruits and vegetables, whole grains, and beans. ? Limit foods that are high in fat and processed sugars, such as fried and sweet foods.  Keep all follow-up visits as told by your health care provider. This is important. Contact a health care provider if:  You have chills or a fever.  You have redness, swelling, or pain around an incision.  You have fluid or blood coming from an incision.  Your incision feels warm to the touch.  You have pus or a bad smell coming from an incision.  An incision breaks open.  You feel dizzy or light-headed.  You have pain or bleeding when you urinate.  You have diarrhea, nausea, or vomiting that does not go away.  You have abnormal vaginal discharge.  You have a rash.  You have pain that does not get better with medicine. Get help right away if:  You have a fever and your symptoms suddenly get worse.  You have severe abdominal pain.  You have chest pain.  You have shortness of breath.  You faint.  You have pain, swelling, or redness on your leg.  You have heavy vaginal bleeding with blood clots. Summary  After the procedure it  is common to have abdominal pain. Your provider will give you medication for this.  Do not take baths, swim, or use a hot tub until your health care provider approves.  Do not lift anything that is heavier than 10 lbs (4.5 kg) for one month after surgery, or as long as told by your health care provider.  Notify your provider if you have any signs or symptoms of infection after the procedure. This information is not intended to replace advice given to you by your health care provider. Make sure you discuss any questions you have with your health care provider. Document Released: 04/21/2013 Document Revised: 09/11/2016 Document Reviewed: 09/11/2016 Elsevier Interactive Patient Education  2019 [facility] Home Care Instructions  Activity: Get plenty of rest for the remainder of the day. A responsible individual must stay with you for 24 hours following the procedure.  For the next 24 hours, DO NOT: -Drive a car -Paediatric nurse -Drink alcoholic beverages -Take any medication unless instructed by your physician -Make any  legal decisions or sign important papers.  Meals: Start with liquid foods such as gelatin or soup. Progress to regular foods as tolerated. Avoid greasy, spicy, heavy foods. If nausea and/or vomiting occur, drink only clear liquids until the nausea and/or vomiting subsides. Call your physician if vomiting continues.  Special Instructions/Symptoms: Your throat may feel dry or sore from the anesthesia or the breathing tube placed in your throat during surgery. If this causes discomfort, gargle with warm salt water. The discomfort should disappear within 24 hours.

## 2018-09-15 NOTE — Anesthesia Preprocedure Evaluation (Signed)
Anesthesia Evaluation  Patient identified by MRN, date of birth, ID band Patient awake    Reviewed: Allergy & Precautions, NPO status , Patient's Chart, lab work & pertinent test results  Airway Mallampati: II  TM Distance: >3 FB Neck ROM: Full    Dental no notable dental hx.    Pulmonary asthma , Current Smoker,    Pulmonary exam normal breath sounds clear to auscultation       Cardiovascular hypertension, Normal cardiovascular exam Rhythm:Regular Rate:Normal     Neuro/Psych negative neurological ROS  negative psych ROS   GI/Hepatic negative GI ROS, Neg liver ROS,   Endo/Other  negative endocrine ROS  Renal/GU negative Renal ROS  negative genitourinary   Musculoskeletal negative musculoskeletal ROS (+)   Abdominal   Peds negative pediatric ROS (+)  Hematology negative hematology ROS (+)   Anesthesia Other Findings   Reproductive/Obstetrics negative OB ROS                             Anesthesia Physical Anesthesia Plan  ASA: II  Anesthesia Plan: General   Post-op Pain Management:    Induction: Intravenous  PONV Risk Score and Plan: 3 and Ondansetron, Dexamethasone, Treatment may vary due to age or medical condition and Scopolamine patch - Pre-op  Airway Management Planned: Oral ETT  Additional Equipment:   Intra-op Plan:   Post-operative Plan: Extubation in OR  Informed Consent: I have reviewed the patients History and Physical, chart, labs and discussed the procedure including the risks, benefits and alternatives for the proposed anesthesia with the patient or authorized representative who has indicated his/her understanding and acceptance.     Dental advisory given  Plan Discussed with: CRNA and Surgeon  Anesthesia Plan Comments:         Anesthesia Quick Evaluation

## 2018-09-15 NOTE — Brief Op Note (Signed)
09/15/2018  11:28 AM  PATIENT:  Scot Jun  48 y.o. female  PRE-OPERATIVE DIAGNOSIS:  Fibroids  POST-OPERATIVE DIAGNOSIS:  Fibroids  PROCEDURE:  Procedure(s): XI ROBOTIC ASSISTED TOTAL HYSTERECTOMY WITH SALPINGECTOMY (N/A)  SURGEON:  Surgeon(s) and Role:    * Lavonia Drafts, MD - Primary    * Constant, Peggy, MD - Assisting  ANESTHESIA:   local and general  EBL:  50 mL   BLOOD ADMINISTERED:none  DRAINS: none   LOCAL MEDICATIONS USED:  MARCAINE     SPECIMEN:  Source of Specimen:  uterus, cervix and fallopian tubes   DISPOSITION OF SPECIMEN:  PATHOLOGY  COUNTS:  YES  TOURNIQUET:  * No tourniquets in log *  DICTATION: .Note written in EPIC  PLAN OF CARE: discharge to home after 6 hours observation   PATIENT DISPOSITION:  PACU - hemodynamically stable.   Delay start of Pharmacological VTE agent (>24hrs) due to surgical blood loss or risk of bleeding: not applicable  Complications: none immediate   Deaira Leckey L. Harraway-Smith, M.D., Cherlynn June

## 2018-09-15 NOTE — Transfer of Care (Signed)
Immediate Anesthesia Transfer of Care Note  Patient: Carol Mack  Procedure(s) Performed: XI ROBOTIC ASSISTED TOTAL HYSTERECTOMY WITH SALPINGECTOMY (N/A )  Patient Location: PACU  Anesthesia Type:General  Level of Consciousness: sedated and responds to stimulation  Airway & Oxygen Therapy: Patient Spontanous Breathing and Patient connected to nasal cannula oxygen  Post-op Assessment: Report given to RN  Post vital signs: Reviewed and stable  Last Vitals:  Vitals Value Taken Time  BP 124/75 09/15/2018 10:45 AM  Temp    Pulse 70 09/15/2018 10:47 AM  Resp 16 09/15/2018 10:47 AM  SpO2 100 % 09/15/2018 10:47 AM  Vitals shown include unvalidated device data.  Last Pain:  Vitals:   09/15/18 0537  TempSrc: Oral  PainSc: 0-No pain         Complications: No apparent anesthesia complications

## 2018-09-15 NOTE — Anesthesia Procedure Notes (Signed)
Procedure Name: Intubation Date/Time: 09/15/2018 7:42 AM Performed by: Bonney Aid, CRNA Pre-anesthesia Checklist: Patient identified, Emergency Drugs available, Suction available and Patient being monitored Patient Re-evaluated:Patient Re-evaluated prior to induction Oxygen Delivery Method: Circle system utilized Preoxygenation: Pre-oxygenation with 100% oxygen Induction Type: IV induction Ventilation: Mask ventilation without difficulty Laryngoscope Size: Mac and 3 Grade View: Grade I Tube type: Oral Tube size: 7.0 mm Number of attempts: 1 Airway Equipment and Method: Stylet Placement Confirmation: ETT inserted through vocal cords under direct vision,  positive ETCO2 and breath sounds checked- equal and bilateral Secured at: 21 cm Tube secured with: Tape Dental Injury: Teeth and Oropharynx as per pre-operative assessment

## 2018-09-15 NOTE — H&P (Addendum)
Preoperative History and Physical  Carol Mack is a 48 y.o. [id number] here for surgical management of AUB and fibroids.   Proposed surgery: Robot assisted total laparoscopic hysterectomy with bilateral salpingectomy  Past Medical History:  Diagnosis Date  . Asthma   . Bartholin cyst   . Hernia, inguinal   . Hernia, inguinal   . Hypertension    Past Surgical History:  Procedure Laterality Date  . fibroid freezing     unsure  . IR GENERIC HISTORICAL  07/27/2014   IR RADIOLOGIST EVAL & MGMT 07/27/2014 Markus Daft, MD [facility] INTERV RAD  . NO PAST SURGERIES     OB History    Gravida  2   Para  2   Term  2   Preterm      AB      Living  2     SAB      TAB      Ectopic      Multiple      Live Births  2          Patient denies any cervical dysplasia or STIs. Medications Prior to Admission  Medication Sig Dispense Refill Last Dose  . amLODipine (NORVASC) 5 MG tablet Take 1 tablet (5 mg total) by mouth daily. 30 tablet 1 09/15/2018 at 0430  . albuterol (PROVENTIL HFA;VENTOLIN HFA) 108 (90 BASE) MCG/ACT inhaler Inhale 2 puffs into the lungs every 6 (six) hours as needed for shortness of breath.   More than a month at Unknown time  . clindamycin (CLEOCIN) 2 % vaginal cream Place 1 Applicatorful vaginally at bedtime. (Patient not taking: Reported on 09/03/2018) 40 g 0 Not Taking at Unknown time  . clindamycin (CLINDAGEL) 1 % gel Apply topically 2 (two) times daily. (Patient not taking: Reported on 09/03/2018) 30 g 11 Not Taking at Unknown time  . fluticasone (FLONASE) 50 MCG/ACT nasal spray Place 1 spray into both nostrils daily. (Patient not taking: Reported on 09/03/2018) 9.9 g 2 Not Taking at Unknown time  . Ibuprofen (ADVIL) 200 MG CAPS Take 400 mg by mouth daily as needed (pain).    More than a month at Unknown time    Allergies  Allergen Reactions  . Percocet [Oxycodone-Acetaminophen] Other (See Comments)    jittery   Social History:   reports that she has been  smoking cigarettes. She has been smoking about 1.00 pack per day. She has never used smokeless tobacco. She reports that she does not drink alcohol or use drugs. Family History  Problem Relation Age of Onset  . Thyroid disease Mother        large benign goiter    Review of Systems: Noncontributory  PHYSICAL EXAM: Blood pressure 135/88, pulse 65, temperature 98.3 F (36.8 C), temperature source Oral, resp. rate 16, height 5\' 5"  (1.651 m), weight 67.8 kg, last menstrual period 09/03/2018, SpO2 100 %. General appearance - alert, well appearing, and in no distress Chest - clear to auscultation, no wheezes, rales or rhonchi, symmetric air entry Heart - normal rate and regular rhythm Abdomen - soft, nontender, nondistended, no masses or organomegaly Pelvic - examination not indicated Extremities - peripheral pulses normal, no pedal edema, no clubbing or cyanosis  Labs: Results for orders placed or performed during the hospital encounter of 09/15/18 (from the past 336 hour(s))  Pregnancy, urine POC   Collection Time: 09/15/18  6:50 AM  Result Value Ref Range   Preg Test, Ur NEGATIVE NEGATIVE  Results for orders placed or performed  during the hospital encounter of 09/09/18 (from the past 336 hour(s))  Type and screen [place]   Collection Time: 09/09/18  2:22 PM  Result Value Ref Range   ABO/RH(D) O POS    Antibody Screen NEG    Sample Expiration 09/18/2018    Extend sample reason      NO TRANSFUSIONS OR PREGNANCY IN THE PAST 3 MONTHS Performed at [facility] [address], [place], [place] [address]   ABO/Rh   Collection Time: 09/09/18  2:22 PM  Result Value Ref Range   ABO/RH(D)      O POS Performed at [facility], [place] [address] [place], [place] [address]   Basic metabolic panel   Collection Time: 09/09/18  2:30 PM  Result Value Ref Range   Sodium 137 135 - 145 mmol/L   Potassium 3.7 3.5 - 5.1 mmol/L   Chloride  105 98 - 111 mmol/L   CO2 27 22 - 32 mmol/L   Glucose, Bld 94 70 - 99 mg/dL   BUN 10 6 - 20 mg/dL   Creatinine, Ser 0.64 0.44 - 1.00 mg/dL   Calcium 9.2 8.9 - 10.3 mg/dL   GFR calc non Af Amer >60 >60 mL/min   GFR calc Af Amer >60 >60 mL/min   Anion gap 5 5 - 15  CBC   Collection Time: 09/09/18  2:30 PM  Result Value Ref Range   WBC 6.7 4.0 - 10.5 K/uL   RBC 5.70 (H) 3.87 - 5.11 MIL/uL   Hemoglobin 14.5 12.0 - 15.0 g/dL   HCT 42.5 36.0 - 46.0 %   MCV 74.6 (L) 80.0 - 100.0 fL   MCH 25.4 (L) 26.0 - 34.0 pg   MCHC 34.1 30.0 - 36.0 g/dL   RDW 14.5 11.5 - 15.5 %   Platelets 246 150 - 400 K/uL   nRBC 0.0 0.0 - 0.2 %    Imaging Studies: US Breast Ltd Uni Left Inc Axilla  Result Date: 09/03/2018 CLINICAL DATA:  Follow-up for left breast abscess. EXAM: ULTRASOUND OF THE LEFT BREAST COMPARISON:  Previous exam(s). FINDINGS: Physical examination the left breast demonstrates dark skin discoloration and thickening at the approximate 8 o'clock position. No erythema. No purulent drainage. Targeted ultrasound of the left breast was performed. The previously measured abscess now measures 0.6 cm, previously measured 1.2 cm. The ill-defined hypoechoic area/collection within the skin measures up to 2.4 x 0.5 by 1.1 cm, also decreased in size when remeasured in a similar fashion. IMPRESSION: Continued decrease in size of left abscess. RECOMMENDATION: A final follow-up left breast ultrasound in approximately 4-6 weeks is recommended to ensure resolution. The patient has been instructed to return earlier if she feels as if her abscess/symptoms are worsening. I have discussed the findings and recommendations with the patient. Results were also provided in writing at the conclusion of the visit. If applicable, a reminder letter will be sent to the patient regarding the next appointment. BI-RADS CATEGORY  3: Probably benign. Electronically Signed   By: Everlean Alstrom M.D.   On: 09/03/2018 12:27   US Breast Ltd  Uni Left Inc Axilla  Result Date: 08/20/2018 CLINICAL DATA:  48 year old female for follow-up of LEFT breast abscess. EXAM: ULTRASOUND OF THE LEFT BREAST COMPARISON:  Previous exam(s). FINDINGS: On physical exam, palpable mass with mild overlying erythema at the 8 o'clock position of the LEFT breast 5 cm from the nipple noted. Targeted ultrasound is performed, showing a 1.2 x  0.5 x 1 cm collection/abscess at the 8 o'clock position of the LEFT breast 5 cm from the nipple, previously measuring 2 x 1.6 x 1 cm. LEFT axillary lymph nodes with borderline cortical thickening are compatible with reactive nodes. IMPRESSION: Residual 1.2 cm abscess at the 8 o'clock position of the LEFT breast. The patient refuses further aspiration. After discussion with the patient, we agree to extend her doxycycline antibiotic course for an additional week. RECOMMENDATION: Doxycycline 100 mg p.o. b.i.d. for 1 week. Follow-up LEFT breast ultrasound in 1 week. I have discussed the findings and recommendations with the patient. Results were also provided in writing at the conclusion of the visit. If applicable, a reminder letter will be sent to the patient regarding the next appointment. BI-RADS CATEGORY  2: Benign. Electronically Signed   By: Margarette Canada M.D.   On: 08/20/2018 16:28   06/02/2014 INDICATION: 48 year old with uterine fibroids, menorrhagia and abdominal pain.  EXAM: BILATERAL UTERINE ARTERY EMBOLIZATION; ULTRASOUND GUIDANCE FOR VASCULAR ACCESS; BILATERAL PELVIC ANGIOGRAPHY  Physician: Stephan Minister. Anselm Pancoast, MD  FLUOROSCOPY TIME:  42 min and 30 seconds, 2924 mGy  MEDICATIONS AND MEDICAL HISTORY: 4 mg versed, 200 mcg fentanyl, 2 g Ancef, 20 mg Decadron and 30 mg Toradol. A radiology nurse monitored the patient for moderate sedation.  ANESTHESIA/SEDATION: Moderate sedation time: 120 minutes  PROCEDURE: Informed consent was obtained for the uterine artery embolization procedure. The patient was placed supine on  the interventional table. The right groin was prepped and draped in a sterile fashion. Maximal barrier sterile technique was utilized including caps, mask, sterile gowns, sterile gloves, sterile drape, hand hygiene and skin antiseptic. The skin was anesthetized 1% lidocaine. Using ultrasound guidance, 21 gauge needle was directed in the right common femoral artery and a micropuncture dilator set was placed. The vascular access was upsized to a 5-French vascular sheath. A Cobra catheter was used to cannulate the left common iliac artery and the left internal iliac artery. A series of arteriograms were performed to identify the left uterine artery orfice. A Progreat microcatheter was advanced into the left uterine artery. 2 vials of 500 - 700 micron Embospheres and 3 vials of 700-900 micron Embospheres were injected through the microcatheter with fluoroscopic guidance. There was near stasis of the left uterine artery following administration of the particles. Post embolization arteriography was performed to document near stasis of the left uterine artery.  A Waltman's loop was formed using the Cobra catheter and the right internal iliac artery was cannulated. The right uterine artery was identified with contrast angiograms. The microcatheter was advanced into the right uterine artery and a series of angiograms were performed. 3 vials of 500-700 micron Embospheres and 1/2 vial of 700-900 micron Embospheres were injected through the microcatheter with fluoroscopic guidance. The particles were injected until there was near stasis of the right uterine artery. Post embolization arteriography was performed to document near stasis of the right uterine artery.  The microcatheter was removed. The Cobra catheter was straightened out over the aortic bifurcation and removed over a wire. Angiogram performed through the right groin sheath. The vascular sheath was removed with an Exoseal closure  device.  FINDINGS: Large uterine arteries bilaterally. Left uterine artery is predominantly supplying the lower uterine segment. Right uterine artery is supplying the fundus. Near stasis of the uterine arteries at the end of the procedure. Fluoroscopic images were obtained for documentation.  COMPLICATIONS: None  IMPRESSION: Successful bilateral uterine artery embolization procedure.   Assessment: Patient Active Problem List  Diagnosis Date Noted  . S/P laparoscopic hysterectomy 09/15/2018  . Left breast lump 08/07/2018  . Goiter 05/28/2018  . Essential hypertension 05/28/2018  . Cigarette nicotine dependence without complication 49/82/6415  . Multinodular goiter 01/19/2016  . Hyperthyroidism 09/25/2015  . Fibroid, uterine   . Fibroids 06/02/2014  . Menorrhagia with regular cycle   . Fibroid uterus 12/23/2013  tobacco abuse   Plan: Patient will undergo surgical management with Robot assisted total laparoscopic hysterectomy with bilateral salpingectomy.   The risks of surgery were discussed in detail with the patient including but not limited to: bleeding which may require transfusion or reoperation; infection which may require antibiotics; injury to surrounding organs which may involve bowel, bladder, ureters ; need for additional procedures including laparoscopy or laparotomy; thromboembolic phenomenon, surgical site problems and other postoperative/anesthesia complications. Likelihood of success in alleviating the patient's condition was discussed. Routine postoperative instructions will be reviewed with the patient and her family in detail after surgery.  The patient concurred with the proposed plan, giving informed written consent for the surgery.  Patient has been NPO since last night she will remain NPO for procedure.  Anesthesia and OR aware.  Preoperative prophylactic antibiotics and SCDs ordered on call to the OR.  To OR when ready.  Kalliope Riesen L. Ihor Dow, M.D.,  [facility] 09/15/2018 7:17 AM

## 2018-09-16 ENCOUNTER — Encounter (HOSPITAL_BASED_OUTPATIENT_CLINIC_OR_DEPARTMENT_OTHER): Payer: Self-pay | Admitting: Obstetrics & Gynecology

## 2018-09-17 ENCOUNTER — Telehealth: Payer: Self-pay | Admitting: *Deleted

## 2018-09-17 ENCOUNTER — Telehealth: Payer: Self-pay | Admitting: Obstetrics & Gynecology

## 2018-09-17 NOTE — Telephone Encounter (Signed)
Dr. Maylene Roes called patient- please see her note.

## 2018-09-17 NOTE — Telephone Encounter (Signed)
Carol Mack called this am and left a message she is calling because she is lightheaded and dizzy and still having pain in stomach. States her doctor is Dr. Ihor Dow and she  Had surgery yesterday.   I called [place] and she states the doctor told her if she keeps having pain, dizziness, lightheaded and she will order another medicine.  We discussed it is normal to still be having pain- she states she is taking the Vicodin - only 1 tab every 6 hours and taking ibuprofen in between Vicodin. States she feels like it is not helping her pain. Also states is not having the dizziness/lightheadedness when she is sitting, only when she stands. She states she is eating well and drinking lots of fluids. She states she has someone with her . I advised her to have someone with her when she needs to get up, continue drinking , eating. I advised her that the dizziness/ lightheadedness may be due to anesthetics she had yesterday and it may wear off sometime today. She insists that Dr. Maylene Roes said to call. I informed her that she is not in this office; but I will forward urgent message to her and if she has any new instructions that either she or one of [place] will call her back. She voices understanding.

## 2018-09-17 NOTE — Telephone Encounter (Signed)
Pt is taking Motrin and Norco. Pt reports that she is having soreness. She thought she needed to take the meds every 6 hours. She is walking but, not hourly. Rec walking around every hour. Pt reports no problems eating. She wants to take a bath. May take a shower. Pt has a small amount of bleeding maybe but, has not looked. Pt denies fever or chills.   All questions were answered.   Berthel Bagnall L. Harraway-Smith, M.D., [facility]

## 2018-09-17 NOTE — Discharge Summary (Signed)
Physician Discharge Summary  Patient ID: Carol Mack MRN: [id number] DOB/AGE: 48-09-1970 48 y.o.  Admit date: 09/15/2018 Discharge date: 09/17/2018  Admission Diagnoses: uterine fibroids; Pelvic pain; AUB   Discharge Diagnoses:  Principal Problem:   Fibroid uterus Active Problems:   Fibroids   Menorrhagia with regular cycle   Hyperthyroidism   Multinodular goiter   Essential hypertension   Cigarette nicotine dependence without complication   Discharged Condition: good  Hospital Course: Patient had an uncomplicated surgery; for further details of this surgery, please refer to the operative note. Furthermore, the patient had an uncomplicated postoperative course.  She was observed in the [facility] post op. By time of discharge, her pain was controlled on oral pain medications; she was ambulating, voiding without difficulty, tolerating regular diet and passing flatus.  She was deemed stable for discharge to home.    Treatments: surgery: robot Assisted Total Laparoscopic Hysterectomy with bilateral salpingectomy  Discharge Exam: Blood pressure 94/70, pulse 82, temperature 98.3 F (36.8 C), resp. rate 18, height 5\' 5"  (1.651 m), weight 67.8 kg, last menstrual period 09/03/2018, SpO2 97 %.  Disposition: stable  Discharge Instructions    Call MD for:  difficulty breathing, headache or visual disturbances   Complete by:  As directed    Call MD for:  hives   Complete by:  As directed    Call MD for:  persistant dizziness or light-headedness   Complete by:  As directed    Call MD for:  persistant nausea and vomiting   Complete by:  As directed    Call MD for:  redness, tenderness, or signs of infection (pain, swelling, redness, odor or green/yellow discharge around incision site)   Complete by:  As directed    Call MD for:  severe uncontrolled pain   Complete by:  As directed    Call MD for:  temperature >100.4   Complete by:  As directed    Diet - low sodium heart  healthy   Complete by:  As directed    Discharge wound care:   Complete by:  As directed    Keep port sites clean and dry.   Driving Restrictions   Complete by:  As directed    No driving for 2 weeks   Increase activity slowly   Complete by:  As directed    Lifting restrictions   Complete by:  As directed    No heavy lifting for 6 weeks   Sexual Activity Restrictions   Complete by:  As directed    No sexual intercourse for 8 weeks     Allergies as of 09/15/2018      Reactions   Percocet [oxycodone-acetaminophen] Other (See Comments)   jittery      Medication List    STOP taking these medications   Advil 200 MG Caps Generic drug:  Ibuprofen Replaced by:  ibuprofen 800 MG tablet   clindamycin 1 % gel Commonly known as:  CLINDAGEL   clindamycin 2 % vaginal cream Commonly known as:  Cleocin   fluticasone 50 MCG/ACT nasal spray Commonly known as:  FLONASE     TAKE these medications   albuterol 108 (90 Base) MCG/ACT inhaler Commonly known as:  PROVENTIL HFA;VENTOLIN HFA Inhale 2 puffs into the lungs every 6 (six) hours as needed for shortness of breath.   amLODipine 5 MG tablet Commonly known as:  Norvasc Take 1 tablet (5 mg total) by mouth daily.   docusate sodium 100 MG capsule Commonly known as:  COLACE Take 1 capsule (100 mg total) by mouth 2 (two) times daily as needed.   HYDROcodone-acetaminophen 5-325 MG tablet Commonly known as:  NORCO/VICODIN Take 1 tablet by mouth every 6 (six) hours as needed for moderate pain.   ibuprofen 800 MG tablet Commonly known as:  ADVIL,MOTRIN Take 1 tablet (800 mg total) by mouth every 8 (eight) hours as needed. Replaces:  Advil 200 MG Caps            Discharge Care Instructions  (From admission, onward)         Start     Ordered   09/15/18 0000  Discharge wound care:    Comments:  Keep port sites clean and dry.   09/15/18 1135         Follow-up Information    Constant, Peggy, MD In 2 weeks.   Specialty:   Obstetrics and Gynecology Contact information: [place] [address] [phone]           Signed: Lavonia Drafts 09/17/2018, 5:19 PM

## 2018-09-24 ENCOUNTER — Ambulatory Visit: Payer: 59 | Admitting: Obstetrics & Gynecology

## 2018-09-24 DIAGNOSIS — Z029 Encounter for administrative examinations, unspecified: Secondary | ICD-10-CM

## 2018-10-02 ENCOUNTER — Encounter: Payer: Self-pay | Admitting: *Deleted

## 2018-10-06 ENCOUNTER — Telehealth: Payer: Self-pay | Admitting: Student

## 2018-10-06 NOTE — Telephone Encounter (Signed)
Called patient to inform of cancelled appointment for tomorrow as well as the rescheduled appointment scheduled for April 20th. Left the patient a detailed voicemail.

## 2018-10-07 ENCOUNTER — Ambulatory Visit: Payer: 59 | Admitting: Obstetrics and Gynecology

## 2018-10-08 ENCOUNTER — Other Ambulatory Visit: Payer: Self-pay | Admitting: *Deleted

## 2018-10-08 ENCOUNTER — Encounter: Payer: Self-pay | Admitting: Obstetrics & Gynecology

## 2018-10-08 DIAGNOSIS — Z0000 Encounter for general adult medical examination without abnormal findings: Secondary | ICD-10-CM

## 2018-10-08 MED ORDER — ALBUTEROL SULFATE HFA 108 (90 BASE) MCG/ACT IN AERS: 2.0000 | INHALATION_SPRAY | Freq: Four times a day (QID) | RESPIRATORY_TRACT | 1 refills | Status: DC | PRN

## 2018-10-08 MED ORDER — AMLODIPINE BESYLATE 5 MG PO TABS: 5.0000 mg | ORAL_TABLET | Freq: Every day | ORAL | 1 refills | Status: DC

## 2018-10-08 NOTE — Telephone Encounter (Signed)
Carol Mack called front desk asking about refills. I called Carol Mack and she requested refill on her BP pills and inhaler. I asked if she has a PCP she is seeing in this area and she states no. I informed her I will ask Dr. Maylene Roes -Tamala Julian to approve refills and if denied, we will call her .she confirmed her pharmacy usually notifies her when rx ready for pick up. .She voices understanding.

## 2018-11-02 ENCOUNTER — Telehealth: Payer: Self-pay | Admitting: Family Medicine

## 2018-11-02 ENCOUNTER — Ambulatory Visit: Payer: 59 | Admitting: Obstetrics & Gynecology

## 2018-11-02 NOTE — Telephone Encounter (Signed)
Called the patient to inform of upcoming visit virtual  tomorrow. Left a detailed voicemail.

## 2018-11-03 ENCOUNTER — Ambulatory Visit: Payer: 59 | Admitting: Obstetrics and Gynecology

## 2018-11-03 ENCOUNTER — Encounter: Payer: Self-pay | Admitting: Obstetrics and Gynecology

## 2018-11-03 ENCOUNTER — Ambulatory Visit (INDEPENDENT_AMBULATORY_CARE_PROVIDER_SITE_OTHER): Payer: 59 | Admitting: Obstetrics and Gynecology

## 2018-11-03 ENCOUNTER — Other Ambulatory Visit: Payer: Self-pay

## 2018-11-03 ENCOUNTER — Telehealth: Payer: Self-pay | Admitting: Obstetrics and Gynecology

## 2018-11-03 DIAGNOSIS — Z9889 Other specified postprocedural states: Secondary | ICD-10-CM

## 2018-11-03 MED ORDER — METRONIDAZOLE 375 MG PO CAPS: 375.0000 mg | ORAL_CAPSULE | Freq: Two times a day (BID) | ORAL | 0 refills | Status: DC

## 2018-11-03 NOTE — Progress Notes (Signed)
1038: Attempted to contact pt to get her prepared for her video appointment.  Pt did not pick up.  Left message informing pt that she was being contacted regarding her appointment and to please contact the clinic.

## 2018-11-03 NOTE — Progress Notes (Signed)
   TELEHEALTH VIRTUAL GYNECOLOGY VISIT ENCOUNTER NOTE  I connected with Carol Mack on 11/03/18 at 10:55 AM EDT by telephone at home and verified that I am speaking with the correct person using two identifiers.   I discussed the limitations, risks, security and privacy concerns of performing an evaluation and management service by telephone and the availability of in person appointments. I also discussed with the patient that there may be a patient responsible charge related to this service. The patient expressed understanding and agreed to proceed.   History:  Carol Mack is a 48 y.o. [phone] female being evaluated today for follow up following [facility] on 09/15/18. Patient reports the presence of a brownish, non pruritic discharge with odor for the past few days. She denies any fever, bleeding, pelvic pain or other concerns. Patient has returned to her regular activities and has returned to work last week.     Past Medical History:  Diagnosis Date  . Asthma   . Bartholin cyst   . Hernia, inguinal   . Hernia, inguinal   . Hypertension    Past Surgical History:  Procedure Laterality Date  . fibroid freezing     unsure  . IR GENERIC HISTORICAL  07/27/2014   IR RADIOLOGIST EVAL & MGMT 07/27/2014 Markus Daft, MD [facility] INTERV RAD  . NO PAST SURGERIES    . ROBOTIC ASSISTED TOTAL HYSTERECTOMY N/A 09/15/2018   Procedure: XI ROBOTIC ASSISTED TOTAL HYSTERECTOMY WITH SALPINGECTOMY;  Surgeon: Lavonia Drafts, MD;  Location: [facility];  Service: Gynecology;  Laterality: N/A;   The following portions of the patient's history were reviewed and updated as appropriate: allergies, current medications, past family history, past medical history, past social history, past surgical history and problem list.   Health Maintenance:  Normal pap and negative HRHPV on 07/2018.  Mammogram requires follow up next month.   Review of Systems:  Pertinent items noted in HPI and remainder of  comprehensive ROS otherwise negative.  Physical Exam:   General:  Alert, oriented and cooperative.   Mental Status: Normal mood and affect perceived. Normal judgment and thought content.  Physical exam deferred due to nature of the encounter  Labs and Imaging No results found for this or any previous visit (from the past 336 hour(s)). No results found.    Assessment and Plan:     48 yo here for post op check s/p [facility] with suspected BV.     Rx Flagyl has been e-prescribed RTC in January for annual exam Patient to follow up with breast center regarding ultrasound follow up I discussed the assessment and treatment plan with the patient. The patient was provided an opportunity to ask questions and all were answered. The patient agreed with the plan and demonstrated an understanding of the instructions.   The patient was advised to call back or seek an in-person evaluation/go to the ED if the symptoms worsen or if the condition fails to improve as anticipated.  I provided 15 minutes of non-face-to-face time during this encounter.   Mora Bellman, MD [facility]

## 2018-11-03 NOTE — Telephone Encounter (Signed)
The patient called regarding information on the app. Informed the patient a nurse will call with information regarding the required information to start the meeting.

## 2018-11-08 ENCOUNTER — Other Ambulatory Visit: Payer: Self-pay | Admitting: Obstetrics & Gynecology

## 2018-11-08 DIAGNOSIS — Z0000 Encounter for general adult medical examination without abnormal findings: Secondary | ICD-10-CM

## 2019-01-18 ENCOUNTER — Encounter: Payer: Self-pay | Admitting: Family Medicine

## 2019-01-18 ENCOUNTER — Other Ambulatory Visit: Payer: Self-pay

## 2019-01-18 ENCOUNTER — Ambulatory Visit (INDEPENDENT_AMBULATORY_CARE_PROVIDER_SITE_OTHER): Payer: 59 | Admitting: Family Medicine

## 2019-01-18 DIAGNOSIS — Z20822 Contact with and (suspected) exposure to covid-19: Secondary | ICD-10-CM

## 2019-01-18 DIAGNOSIS — I10 Essential (primary) hypertension: Secondary | ICD-10-CM

## 2019-01-18 DIAGNOSIS — Z20828 Contact with and (suspected) exposure to other viral communicable diseases: Secondary | ICD-10-CM | POA: Diagnosis not present

## 2019-01-18 MED ORDER — AMLODIPINE BESYLATE 5 MG PO TABS: 5.0000 mg | ORAL_TABLET | Freq: Every day | ORAL | 3 refills | Status: DC

## 2019-01-18 NOTE — Progress Notes (Signed)
Virtual Visit via Video Note  I connected with Carol Mack on 01/18/19 at  4:30 PM EDT by a video enabled telemedicine application and verified that I am speaking with the correct person using two identifiers.  Location patient: home Location provider:work or home office Persons participating in the virtual visit: patient, provider  I discussed the limitations of evaluation and management by telemedicine and the availability of in person appointments. The patient expressed understanding and agreed to proceed.   HPI: Pt states her job informed her that an employee tested positive for COVID-19.  Pt states she works close to the person who tested positive last wk.  Pt asymptomatic but endorses increased anxiety since finding out about the coworker.  Pt states she has been trying all day to get testing done.    HTN: -states bp has been "normal"  150/89, 160/93, 140/70 -has been out meds x a few months -states her OB/Gyn was refilling the meds around the time of her fibroid surgery. -Pt advised after her last appt (05/2018) she was to f/u in 1 month, but did not. -pt endorses occasional HAs.  ROS: See pertinent positives and negatives per HPI.  Past Medical History:  Diagnosis Date  . Asthma   . Bartholin cyst   . Hernia, inguinal   . Hernia, inguinal   . Hypertension     Past Surgical History:  Procedure Laterality Date  . fibroid freezing     unsure  . IR GENERIC HISTORICAL  07/27/2014   IR RADIOLOGIST EVAL & MGMT 07/27/2014 Markus Daft, MD [facility] INTERV RAD  . NO PAST SURGERIES    . ROBOTIC ASSISTED TOTAL HYSTERECTOMY N/A 09/15/2018   Procedure: XI ROBOTIC ASSISTED TOTAL HYSTERECTOMY WITH SALPINGECTOMY;  Surgeon: Lavonia Drafts, MD;  Location: [facility];  Service: Gynecology;  Laterality: N/A;    Family History  Problem Relation Age of Onset  . Thyroid disease Mother        large benign goiter     Current Outpatient Medications:  .  albuterol  (PROVENTIL HFA;VENTOLIN HFA) 108 (90 Base) MCG/ACT inhaler, Inhale 2 puffs into the lungs every 6 (six) hours as needed for shortness of breath. (Patient not taking: Reported on 11/03/2018), Disp: 2 Inhaler, Rfl: 1 .  amLODipine (NORVASC) 5 MG tablet, TAKE 1 TABLET BY MOUTH EVERY DAY, Disp: 30 tablet, Rfl: 1 .  docusate sodium (COLACE) 100 MG capsule, Take 1 capsule (100 mg total) by mouth 2 (two) times daily as needed. (Patient not taking: Reported on 11/03/2018), Disp: 20 capsule, Rfl: 0 .  HYDROcodone-acetaminophen (NORCO/VICODIN) 5-325 MG tablet, Take 1 tablet by mouth every 6 (six) hours as needed for moderate pain. (Patient not taking: Reported on 11/03/2018), Disp: 20 tablet, Rfl: 0 .  ibuprofen (ADVIL,MOTRIN) 800 MG tablet, Take 1 tablet (800 mg total) by mouth every 8 (eight) hours as needed. (Patient not taking: Reported on 11/03/2018), Disp: 30 tablet, Rfl: 0 .  metronidazole (FLAGYL) 375 MG capsule, Take 1 capsule (375 mg total) by mouth 2 (two) times daily., Disp: 14 capsule, Rfl: 0  EXAM:  VITALS per patient if applicable:  GENERAL: alert, oriented, appears well and in no acute distress  HEENT: atraumatic, conjunctiva clear, no obvious abnormalities on inspection of external nose and ears  NECK: normal movements of the head and neck  LUNGS: on inspection no signs of respiratory distress, breathing rate appears normal, no obvious gross SOB, gasping or wheezing  CV: no obvious cyanosis  MS: moves all visible extremities  without noticeable abnormality  PSYCH/NEURO: pleasant and cooperative, no obvious depression or anxiety, speech and thought processing grossly intact  ASSESSMENT AND PLAN:  Discussed the following assessment and plan:  Essential hypertension  -uncontrolled -pt advised to check bp regularly -discussed lifestyle modifications -pt advised to f/u in 1 month to reassess bp  - Plan: amLODipine (NORVASC) 5 MG tablet  Exposure to Covid-19 Virus -will arrange COVID  testing, message sent to community pool    I discussed the assessment and treatment plan with the patient. The patient was provided an opportunity to ask questions and all were answered. The patient agreed with the plan and demonstrated an understanding of the instructions.   The patient was advised to call back or seek an in-person evaluation if the symptoms worsen or if the condition fails to improve as anticipated.   Billie Ruddy, MD

## 2019-01-19 ENCOUNTER — Telehealth: Payer: Self-pay | Admitting: *Deleted

## 2019-01-19 DIAGNOSIS — Z20822 Contact with and (suspected) exposure to covid-19: Secondary | ICD-10-CM

## 2019-01-19 NOTE — Telephone Encounter (Signed)
-----   Message from Billie Ruddy, MD sent at 01/18/2019  4:45 PM EDT ----- Regarding: COVID test Pt requesting COVID testing.  States co-worker tested positive.  Seen via e-visit.

## 2019-01-19 NOTE — Telephone Encounter (Signed)
Scheduled patient for COVID 19 test tomorrow at 9 am.  Testing protocol reviewed with patient.

## 2019-01-20 ENCOUNTER — Other Ambulatory Visit: Payer: 59

## 2019-01-20 DIAGNOSIS — Z20822 Contact with and (suspected) exposure to covid-19: Secondary | ICD-10-CM

## 2019-01-26 LAB — NOVEL CORONAVIRUS, NAA: SARS-CoV-2, NAA: NOT DETECTED

## 2019-04-06 ENCOUNTER — Other Ambulatory Visit: Payer: Self-pay

## 2019-04-06 ENCOUNTER — Ambulatory Visit: Payer: Self-pay

## 2019-04-06 ENCOUNTER — Telehealth (INDEPENDENT_AMBULATORY_CARE_PROVIDER_SITE_OTHER): Payer: 59 | Admitting: Family Medicine

## 2019-04-06 ENCOUNTER — Encounter: Payer: Self-pay | Admitting: Family Medicine

## 2019-04-06 DIAGNOSIS — R51 Headache: Secondary | ICD-10-CM | POA: Diagnosis not present

## 2019-04-06 DIAGNOSIS — R0981 Nasal congestion: Secondary | ICD-10-CM

## 2019-04-06 DIAGNOSIS — R519 Headache, unspecified: Secondary | ICD-10-CM

## 2019-04-06 DIAGNOSIS — J3489 Other specified disorders of nose and nasal sinuses: Secondary | ICD-10-CM | POA: Diagnosis not present

## 2019-04-06 MED ORDER — DOXYCYCLINE HYCLATE 100 MG PO TABS: 100.0000 mg | ORAL_TABLET | Freq: Two times a day (BID) | ORAL | 0 refills | Status: DC

## 2019-04-06 NOTE — Patient Instructions (Signed)
Follow up: if symptoms worsen or persist or new concerns arise.  -I sent the medication(s) we discussed to your pharmacy: Meds ordered this encounter  Medications  . doxycycline (VIBRA-TABS) 100 MG tablet    Sig: Take 1 tablet (100 mg total) by mouth 2 (two) times daily.    Dispense:  14 tablet    Refill:  0   Please let us know if you have any questions or concerns regarding this prescription.  I hope you are feeling better soon! Seek care promptly if your symptoms worsen, new concerns arise or you are not improving with treatment.   Because some of your symptoms overlap with the symptoms of COVID19 (Novel Coronovirus illness) and you reported being around others who were not always wearing masks or practicing good social distancing advise Self Isolation: -see the CDC site for information:   RunningShows.co.za.html   -STAY HOME except for to seek medical care -stay in your own room away from others in your house and use a separate bathroom if possible -Wash hands frequently, wear a mask if you leave your room and interact as little as possible with others -seek medical care immediately if worsening - call our office for a visit or call ahead if going elsewhere to an urgent care  -seek emergency care if very sick or severe symptoms - call 911 -we advise isolating for at least 10 days from the onset of symptoms PLUS 3 days of no fever PLUS 3 days of improving symptoms  Please let us know if you need a work slip or wish to do COVID19 testing.

## 2019-04-06 NOTE — Telephone Encounter (Signed)
Patient called stating that she has been dizzy and had headache and sinus congestion for 2 days.  She has been treating her pain with Ibuprofen.  She states that she feels lightheaded. It is worse with movement of her head side to side. The dizziness makes her feel nauseated.  She denies vomiting. She has no fever.  She took benadryl for the stuffy nose but does not like that it makes her tired. Care advice read to patient.She verbalized understanding. Call transferred to office for scheduling.  Reason for Disposition . [1] MODERATE headache (e.g., interferes with normal activities) AND [2] present > 24 hours AND [3] unexplained  (Exceptions: analgesics not tried, typical migraine, or headache part of viral illness)  Answer Assessment - Initial Assessment Questions 1. DESCRIPTION: "Describe your dizziness."     lightheaded 2. LIGHTHEADED: "Do you feel lightheaded?" (e.g., somewhat faint, woozy, weak upon standing)     hurts 3. VERTIGO: "Do you feel like either you or the room is spinning or tilting?" (i.e. vertigo)     No 4. SEVERITY: "How bad is it?"  "Do you feel like you are going to faint?" "Can you stand and walk?"   - MILD - walking normally   - MODERATE - interferes with normal activities (e.g., work, school)    - SEVERE - unable to stand, requires support to walk, feels like passing out now.    Mild 5. ONSET:  "When did the dizziness begin?"    2 days ago in the AM 6. AGGRAVATING FACTORS: "Does anything make it worse?" (e.g., standing, change in head position)    Turning side to side 7. HEART RATE: "Can you tell me your heart rate?" "How many beats in 15 seconds?"  (Note: not all patients can do this)       unsure 8. CAUSE: "What do you think is causing the dizziness?"     Weather changing sinus issues 9. RECURRENT SYMPTOM: "Have you had dizziness before?" If so, ask: "When was the last time?" "What happened that time?"    Yes last year 10. OTHER SYMPTOMS: "Do you have any other  symptoms?" (e.g., fever, chest pain, vomiting, diarrhea, bleeding)       no 11. PREGNANCY: "Is there any chance you are pregnant?" "When was your last menstrual period?"      No surgery I march  Answer Assessment - Initial Assessment Questions 1. LOCATION: "Where does it hurt?"      front 2. ONSET: "When did the headache start?" (Minutes, hours or days)      2days ago 3. PATTERN: "Does the pain come and go, or has it been constant since it started?"    Comes and goes 4. SEVERITY: "How bad is the pain?" and "What does it keep you from doing?"  (e.g., Scale 1-10; mild, moderate, or severe)   - MILD (1-3): doesn't interfere with normal activities    - MODERATE (4-7): interferes with normal activities or awakens from sleep    - SEVERE (8-10): excruciating pain, unable to do any normal activities       4-7 5. RECURRENT SYMPTOM: "Have you ever had headaches before?" If so, ask: "When was the last time?" and "What happened that time?"      Yes thinks its sinus 6. CAUSE: "What do you think is causing the headache?"     Thinks it is sinus 7. MIGRAINE: "Have you been diagnosed with migraine headaches?" If so, ask: "Is this headache similar?"  Yes, sorta but never dizzy 8. HEAD INJURY: "Has there been any recent injury to the head?"     no 9. OTHER SYMPTOMS: "Do you have any other symptoms?" (fever, stiff neck, eye pain, sore throat, cold symptoms)     No stuffy 10. PREGNANCY: "Is there any chance you are pregnant?" "When was your last menstrual period?"      No  Protocols used: HEADACHE-A-AH, DIZZINESS - [facility]

## 2019-04-06 NOTE — Progress Notes (Signed)
Virtual Visit via Video Note  I connected with Carol Mack  on 04/06/19 at  1:00 PM EDT by a video enabled telemedicine application and verified that I am speaking with the correct person using two identifiers.  Location patient: home Location provider:work or home office Persons participating in the virtual visit: patient, provider  I discussed the limitations of evaluation and management by telemedicine and the availability of in person appointments. The patient expressed understanding and agreed to proceed.   HPI:  Acute visit for sinus congestion: -on and off for a month or so, but worse starting 2 days ago -symptoms include sinus congestion, PND, sinus pain and pressure maxillary and around the nose, HA, thick snot -denies fevers, sob, cough, diarrhea, vomiting, loss of taste and smell, malaise -history of allergies -not taking any medications for allergies -occ takes benadryl -BP has been ok per her report 140/80 -no sick contacts -works in a warehouse, some people don't wear masks - not everyone follows the social distancing and masking rules, but she tries to stay 6 feet away from everyone, reports other then work no exposures to others outside of her home, wears mask to grocery store ROS: See pertinent positives and negatives per HPI.  Past Medical History:  Diagnosis Date  . Asthma   . Bartholin cyst   . Hernia, inguinal   . Hernia, inguinal   . Hypertension     Past Surgical History:  Procedure Laterality Date  . fibroid freezing     unsure  . IR GENERIC HISTORICAL  07/27/2014   IR RADIOLOGIST EVAL & MGMT 07/27/2014 Markus Daft, MD [facility] INTERV RAD  . NO PAST SURGERIES    . ROBOTIC ASSISTED TOTAL HYSTERECTOMY N/A 09/15/2018   Procedure: XI ROBOTIC ASSISTED TOTAL HYSTERECTOMY WITH SALPINGECTOMY;  Surgeon: Lavonia Drafts, MD;  Location: [facility];  Service: Gynecology;  Laterality: N/A;    Family History  Problem Relation Age of Onset  . Thyroid  disease Mother        large benign goiter    SOCIAL HX: see hpi   Current Outpatient Medications:  .  albuterol (PROVENTIL HFA;VENTOLIN HFA) 108 (90 Base) MCG/ACT inhaler, Inhale 2 puffs into the lungs every 6 (six) hours as needed for shortness of breath., Disp: 2 Inhaler, Rfl: 1 .  amLODipine (NORVASC) 5 MG tablet, Take 1 tablet (5 mg total) by mouth daily., Disp: 90 tablet, Rfl: 3 .  doxycycline (VIBRA-TABS) 100 MG tablet, Take 1 tablet (100 mg total) by mouth 2 (two) times daily., Disp: 14 tablet, Rfl: 0  EXAM:  VITALS per patient if applicable:denies fever  GENERAL: alert, oriented, appears well and in no acute distress  HEENT: atraumatic, conjunttiva clear, no obvious abnormalities on inspection of external nose and ears  NECK: normal movements of the head and neck  LUNGS: on inspection no signs of respiratory distress, breathing rate appears normal, no obvious gross SOB, gasping or wheezing  CV: no obvious cyanosis  MS: moves all visible extremities without noticeable abnormality  PSYCH/NEURO: pleasant and cooperative, no obvious depression or anxiety, speech and thought processing grossly intact  ASSESSMENT AND PLAN:  Discussed the following assessment and plan:  Nasal congestion  Sinus pain   headache, unspecified chronicity pattern, unspecified headache type  -we discussed possible serious and likely etiologies, options for evaluation and workup, limitations of telemedicine visit vs in person visit, treatment, treatment risks and precautions. Pt prefers to treat via telemedicine empirically rather then risking or undertaking an in person visit  at this moment. Suspect VRI vs allergies turning into sinusitis most likely vs other. Opted for treatment with nasal saline, Doxy 100mg  bid x 7 days, start antihistamine. Discussed possibility of COVID given overlap in symptoms, she does not feel is possible given her limited exposure and is likely low risk but did advise  staying home. She declined testing. Advised she notify her work place of all symptoms and use good hand hygeine and wear a mask at all time when returns to work. Patient agrees to seek prompt in person care if worsening, new symptoms arise, or if is not improving with treatment.   I discussed the assessment and treatment plan with the patient. The patient was provided an opportunity to ask questions and all were answered. The patient agreed with the plan and demonstrated an understanding of the instructions.      Lucretia Kern, DO   Patient Instructions   Follow up: if symptoms worsen or persist or new concerns arise.  -I sent the medication(s) we discussed to your pharmacy: Meds ordered this encounter  Medications  . doxycycline (VIBRA-TABS) 100 MG tablet    Sig: Take 1 tablet (100 mg total) by mouth 2 (two) times daily.    Dispense:  14 tablet    Refill:  0   Please let us know if you have any questions or concerns regarding this prescription.  I hope you are feeling better soon! Seek care promptly if your symptoms worsen, new concerns arise or you are not improving with treatment.   Because some of your symptoms overlap with the symptoms of COVID19 (Novel Coronovirus illness) and you reported being around others who were not always wearing masks or practicing good social distancing advise Self Isolation: -see the CDC site for information:   RunningShows.co.za.html   -STAY HOME except for to seek medical care -stay in your own room away from others in your house and use a separate bathroom if possible -Wash hands frequently, wear a mask if you leave your room and interact as little as possible with others -seek medical care immediately if worsening - call our office for a visit or call ahead if going elsewhere to an urgent care  -seek emergency care if very sick or severe symptoms - call 911 -we advise isolating for at least  10 days from the onset of symptoms PLUS 3 days of no fever PLUS 3 days of improving symptoms  Please let us know if you need a work slip or wish to do COVID19 testing.

## 2019-04-06 NOTE — Telephone Encounter (Signed)
Pt scheduled for virtual visit 

## 2019-04-12 ENCOUNTER — Ambulatory Visit (INDEPENDENT_AMBULATORY_CARE_PROVIDER_SITE_OTHER): Payer: 59 | Admitting: Nurse Practitioner

## 2019-04-12 ENCOUNTER — Telehealth (INDEPENDENT_AMBULATORY_CARE_PROVIDER_SITE_OTHER): Payer: 59

## 2019-04-12 ENCOUNTER — Other Ambulatory Visit: Payer: Self-pay

## 2019-04-12 ENCOUNTER — Encounter: Payer: Self-pay | Admitting: Nurse Practitioner

## 2019-04-12 VITALS — BP 122/92 | HR 79 | Temp 98.3°F | Wt 147.0 lb

## 2019-04-12 DIAGNOSIS — N898 Other specified noninflammatory disorders of vagina: Secondary | ICD-10-CM

## 2019-04-12 DIAGNOSIS — A599 Trichomoniasis, unspecified: Secondary | ICD-10-CM

## 2019-04-12 DIAGNOSIS — B373 Candidiasis of vulva and vagina: Secondary | ICD-10-CM | POA: Diagnosis not present

## 2019-04-12 DIAGNOSIS — B3731 Acute candidiasis of vulva and vagina: Secondary | ICD-10-CM

## 2019-04-12 DIAGNOSIS — Z113 Encounter for screening for infections with a predominantly sexual mode of transmission: Secondary | ICD-10-CM | POA: Diagnosis not present

## 2019-04-12 NOTE — Telephone Encounter (Signed)
Pt called to speak with a staff member about new symptoms. Pt reports a thick, white discharge with a foul odor and vaginal itching. She reports experiencing these symptoms for the past 2 months, with worsened symptoms about 2 weeks ago. Pt is on doxycycline rx.  Per chart review pt had trichomonas in January. Discussed bringing her in for evaluation and offered after hours appt today at 5:40PM. Pt confirms she will come into the office and has no other questions or concerns at this time.   Apolonio Schneiders RN 04/12/19

## 2019-04-12 NOTE — Progress Notes (Signed)
   GYNECOLOGY OFFICE VISIT NOTE   History:  48 y.o. [id number] here today for vaginal itching which comes and goes and vaginal odor.  Had hysterectomy in March 2020 and has continued to have vaginal problems off and on since. She denies any abnormal vaginal discharge, bleeding, pelvic pain or other concerns.   Past Medical History:  Diagnosis Date  . Asthma   . Bartholin cyst   . Hernia, inguinal   . Hernia, inguinal   . Hypertension     Past Surgical History:  Procedure Laterality Date  . fibroid freezing     unsure  . IR GENERIC HISTORICAL  07/27/2014   IR RADIOLOGIST EVAL & MGMT 07/27/2014 Markus Daft, MD [facility] INTERV RAD  . NO PAST SURGERIES    . ROBOTIC ASSISTED TOTAL HYSTERECTOMY N/A 09/15/2018   Procedure: XI ROBOTIC ASSISTED TOTAL HYSTERECTOMY WITH SALPINGECTOMY;  Surgeon: Lavonia Drafts, MD;  Location: [facility];  Service: Gynecology;  Laterality: N/A;    The following portions of the patient's history were reviewed and updated as appropriate: allergies, current medications, past family history, past medical history, past social history, past surgical history and problem list.     Review of Systems:  Pertinent items noted in HPI and remainder of comprehensive ROS otherwise negative.  Objective:  Physical Exam BP (!) 122/92   Pulse 79   Temp 98.3 F (36.8 C)   Wt 147 lb (66.7 kg)   LMP  (LMP Unknown)   BMI 24.46 kg/m  CONSTITUTIONAL: Well-developed, well-nourished female in no acute distress.  HENT:  Normocephalic, atraumatic. External right and left ear normal.  EYES: Conjunctivae and EOM are normal. Pupils are equal, round.  No scleral icterus.  NECK: Normal range of motion, supple, no masses SKIN: Skin is warm and dry. No rash noted. Not diaphoretic. No erythema. No pallor. NEUROLOGIC: Alert and oriented to person, place, and time. Normal muscle tone coordination. No cranial nerve deficit noted. PSYCHIATRIC: Normal mood and affect. Normal  behavior. Normal judgment and thought content. ABDOMEN: Soft, no distention noted.   PELVIC: Normal appearing external genitalia; normal appearing vaginal mucosa and cervix.  No abnormal discharge noted.  Normal uterine size, no other palpable masses, no uterine or adnexal tenderness.  No odor detected (through mask) but client continues to be disturbed by odor she says it there consistently. MUSCULOSKELETAL: Normal range of motion. No edema noted.  Labs and Imaging No results found.  Assessment & Plan:  1. Vaginal discharge Awaiting lab results before treatment - Cervicovaginal ancillary only( [place])  2. Screen for STD (sexually transmitted disease) Results pending - HIV antibody (with reflex) - [facility]   Routine preventative health maintenance measures emphasized. Please refer to After Visit Summary for other counseling recommendations.   Return in about 1 year (around 04/11/2020).   Total face-to-face time with patient: 15 minutes.  Over 50% of encounter was spent on counseling and coordination of care.  Earlie Server, RN, MSN, NP-BC Nurse Practitioner, [facility] for [employer], [facility] 04/12/2019 6:41 PM

## 2019-04-13 ENCOUNTER — Encounter: Payer: Self-pay | Admitting: Nurse Practitioner

## 2019-04-13 LAB — HIV ANTIBODY (ROUTINE TESTING W REFLEX): HIV Screen 4th Generation wRfx: NONREACTIVE

## 2019-04-13 LAB — RPR: RPR Ser Ql: NONREACTIVE

## 2019-04-14 LAB — CERVICOVAGINAL ANCILLARY ONLY
Bacterial Vaginitis (gardnerella): NEGATIVE
Candida Glabrata: POSITIVE — AB
Candida Vaginitis: POSITIVE — AB
Chlamydia: NEGATIVE
Molecular Disclaimer: NEGATIVE
Molecular Disclaimer: NEGATIVE
Molecular Disclaimer: NEGATIVE
Molecular Disclaimer: NEGATIVE
Molecular Disclaimer: NORMAL
Molecular Disclaimer: NORMAL
Neisseria Gonorrhea: NEGATIVE
Trichomonas: POSITIVE — AB

## 2019-04-15 MED ORDER — TERCONAZOLE 0.4 % VA CREA: 1.0000 | TOPICAL_CREAM | Freq: Every day | VAGINAL | 1 refills | Status: AC

## 2019-04-15 MED ORDER — METRONIDAZOLE 500 MG PO TABS: 500.0000 mg | ORAL_TABLET | Freq: Two times a day (BID) | ORAL | 0 refills | Status: DC

## 2019-04-15 NOTE — Addendum Note (Signed)
Addended by: Virginia Rochester on: 04/15/2019 11:34 PM   Modules accepted: Orders

## 2019-04-15 NOTE — Progress Notes (Signed)
Trichomonas and yeast noted on labs.  Message sent for clinical staff to call her and review trich as a STD which will need her to be treated and her partner to be treated.  When going for testing, her partner will need to say he needs treatment for trichomonas or else he may not receive the appropriate treatment.  Medications for both conditions sent to her pharmacy.  Earlie Server, RN, MSN, NP-BC Nurse Practitioner, [facility] for [employer], [place] 04/15/2019 11:33 PM

## 2019-04-16 ENCOUNTER — Telehealth: Payer: Self-pay | Admitting: *Deleted

## 2019-04-16 NOTE — Telephone Encounter (Signed)
-----   Message from Carol Rochester, NP sent at 04/15/2019 11:34 PM EDT ----- Client has trichomonas and yeast.  Will send medication to her pharmacy but she will need a call to review these results as this is a STD,  Message sent to clinical staff to call her and review results, as well as instructions for getting her partner treated.

## 2019-04-16 NOTE — Telephone Encounter (Signed)
I called Quentina and notified her of + Trich and yeast and meds sent to pharmacy. She states she already got the meds , I reviewed medication instructions with her and that her partner needs to be treated for the trich as well and they should avoid intercourse/ intimate contact until both treated , and wait 1-2 weeks. She voices understanding. Linda,RN

## 2019-06-09 ENCOUNTER — Ambulatory Visit
Admission: RE | Admit: 2019-06-09 | Disposition: A | Discharge status: Home / Self Care | Payer: 59 | Source: Ambulatory Visit

## 2019-06-09 ENCOUNTER — Encounter: Payer: Self-pay | Admitting: Emergency Medicine

## 2019-06-09 ENCOUNTER — Other Ambulatory Visit: Payer: Self-pay

## 2019-06-09 ENCOUNTER — Ambulatory Visit
Admission: EM | Admit: 2019-06-09 | Discharge: 2019-06-09 | Disposition: A | Discharge status: Home / Self Care | Payer: 59 | Source: Home / Self Care

## 2019-06-09 ENCOUNTER — Observation Stay (HOSPITAL_COMMUNITY)
Admission: EM | Admit: 2019-06-09 | Discharge: 2019-06-10 | Disposition: A | Discharge status: Home / Self Care | Payer: 59 | Attending: General Surgery | Admitting: General Surgery

## 2019-06-09 ENCOUNTER — Emergency Department (HOSPITAL_COMMUNITY): Payer: 59

## 2019-06-09 ENCOUNTER — Telehealth: Payer: Self-pay

## 2019-06-09 ENCOUNTER — Encounter (HOSPITAL_COMMUNITY): Payer: Self-pay

## 2019-06-09 DIAGNOSIS — R1031 Right lower quadrant pain: Secondary | ICD-10-CM | POA: Diagnosis present

## 2019-06-09 DIAGNOSIS — R300 Dysuria: Secondary | ICD-10-CM | POA: Diagnosis not present

## 2019-06-09 DIAGNOSIS — Z87440 Personal history of urinary (tract) infections: Secondary | ICD-10-CM | POA: Diagnosis not present

## 2019-06-09 DIAGNOSIS — Z20828 Contact with and (suspected) exposure to other viral communicable diseases: Secondary | ICD-10-CM | POA: Insufficient documentation

## 2019-06-09 DIAGNOSIS — Z79899 Other long term (current) drug therapy: Secondary | ICD-10-CM | POA: Diagnosis not present

## 2019-06-09 DIAGNOSIS — J45909 Unspecified asthma, uncomplicated: Secondary | ICD-10-CM | POA: Diagnosis not present

## 2019-06-09 DIAGNOSIS — I10 Essential (primary) hypertension: Secondary | ICD-10-CM | POA: Diagnosis not present

## 2019-06-09 DIAGNOSIS — F17210 Nicotine dependence, cigarettes, uncomplicated: Secondary | ICD-10-CM | POA: Diagnosis not present

## 2019-06-09 DIAGNOSIS — N83209 Unspecified ovarian cyst, unspecified side: Secondary | ICD-10-CM | POA: Insufficient documentation

## 2019-06-09 DIAGNOSIS — R109 Unspecified abdominal pain: Secondary | ICD-10-CM | POA: Diagnosis present

## 2019-06-09 LAB — POCT URINALYSIS DIP (MANUAL ENTRY)
Bilirubin, UA: NEGATIVE
Blood, UA: NEGATIVE
Glucose, UA: NEGATIVE mg/dL
Ketones, POC UA: NEGATIVE mg/dL
Leukocytes, UA: NEGATIVE
Nitrite, UA: NEGATIVE
Protein Ur, POC: NEGATIVE mg/dL
Spec Grav, UA: >=1.03 — AB (ref 1.010–1.025)
Urobilinogen, UA: 0.2 E.U./dL
pH, UA: 5.5 (ref 5.0–8.0)

## 2019-06-09 LAB — COMPREHENSIVE METABOLIC PANEL
ALT: 12 U/L (ref 0–44)
AST: 13 U/L — ABNORMAL LOW (ref 15–41)
Albumin: 4.1 g/dL (ref 3.5–5.0)
Alkaline Phosphatase: 51 U/L (ref 38–126)
Anion gap: 9 (ref 5–15)
BUN: 9 mg/dL (ref 6–20)
CO2: 24 mmol/L (ref 22–32)
Calcium: 9.4 mg/dL (ref 8.9–10.3)
Chloride: 104 mmol/L (ref 98–111)
Creatinine, Ser: 0.83 mg/dL (ref 0.44–1.00)
GFR calc Af Amer: >60 mL/min (ref >60)
GFR calc non Af Amer: >60 mL/min (ref >60)
Glucose, Bld: 123 mg/dL — ABNORMAL HIGH (ref 70–99)
Potassium: 4 mmol/L (ref 3.5–5.1)
Sodium: 137 mmol/L (ref 135–145)
Total Bilirubin: 0.8 mg/dL (ref 0.3–1.2)
Total Protein: 7.5 g/dL (ref 6.5–8.1)

## 2019-06-09 LAB — POCT URINE PREGNANCY: Preg Test, Ur: NEGATIVE

## 2019-06-09 LAB — CBC
HCT: 43.4 % (ref 36.0–46.0)
Hemoglobin: 15.6 g/dL — ABNORMAL HIGH (ref 12.0–15.0)
MCH: 26.4 pg (ref 26.0–34.0)
MCHC: 35.9 g/dL (ref 30.0–36.0)
MCV: 73.6 fL — ABNORMAL LOW (ref 80.0–100.0)
Platelets: 274 10*3/uL (ref 150–400)
RBC: 5.9 MIL/uL — ABNORMAL HIGH (ref 3.87–5.11)
RDW: 14.1 % (ref 11.5–15.5)
WBC: 9.1 10*3/uL (ref 4.0–10.5)
nRBC: 0 % (ref 0.0–0.2)

## 2019-06-09 LAB — URINALYSIS, ROUTINE W REFLEX MICROSCOPIC
Bilirubin Urine: NEGATIVE
Glucose, UA: NEGATIVE mg/dL
Hgb urine dipstick: NEGATIVE
Ketones, ur: NEGATIVE mg/dL
Leukocytes,Ua: NEGATIVE
Nitrite: NEGATIVE
Protein, ur: NEGATIVE mg/dL
Specific Gravity, Urine: 1.025 (ref 1.005–1.030)
pH: 5 (ref 5.0–8.0)

## 2019-06-09 LAB — I-STAT BETA HCG BLOOD, ED (MC, WL, AP ONLY): I-stat hCG, quantitative: <5 m[IU]/mL (ref <5)

## 2019-06-09 LAB — LIPASE, BLOOD: Lipase: 28 U/L (ref 11–51)

## 2019-06-09 MED ORDER — ONDANSETRON 4 MG PO TBDP: 4.0000 mg | ORAL_TABLET | Freq: Four times a day (QID) | ORAL | Status: DC | PRN

## 2019-06-09 MED ORDER — ONDANSETRON HCL 4 MG/2ML IJ SOLN: 4.0000 mg | Freq: Four times a day (QID) | INTRAMUSCULAR | Status: DC | PRN

## 2019-06-09 MED ORDER — ALBUTEROL SULFATE HFA 108 (90 BASE) MCG/ACT IN AERS
2.0000 | INHALATION_SPRAY | Freq: Four times a day (QID) | RESPIRATORY_TRACT | Status: DC | PRN
  Filled 2019-06-09: qty 6.7

## 2019-06-09 MED ORDER — MORPHINE SULFATE (PF) 2 MG/ML IV SOLN: 2.0000 mg | Freq: Once | INTRAVENOUS | Status: DC

## 2019-06-09 MED ORDER — SODIUM CHLORIDE 0.9 % IV BOLUS
1000.0000 mL | Freq: Once | INTRAVENOUS | Status: AC
  Administered 2019-06-09: 17:00:00 1000 mL via INTRAVENOUS

## 2019-06-09 MED ORDER — TRAMADOL HCL 50 MG PO TABS: 50.0000 mg | ORAL_TABLET | Freq: Four times a day (QID) | ORAL | Status: DC | PRN

## 2019-06-09 MED ORDER — AMLODIPINE BESYLATE 5 MG PO TABS
5.0000 mg | ORAL_TABLET | Freq: Every day | ORAL | Status: DC
  Administered 2019-06-10: 09:00:00 5 mg via ORAL
  Filled 2019-06-09: qty 1

## 2019-06-09 MED ORDER — IBUPROFEN 600 MG PO TABS: 600.0000 mg | ORAL_TABLET | Freq: Four times a day (QID) | ORAL | Status: DC | PRN

## 2019-06-09 MED ORDER — LACTATED RINGERS IV SOLN
INTRAVENOUS | Status: DC
  Administered 2019-06-09: 22:00:00 via INTRAVENOUS

## 2019-06-09 MED ORDER — SODIUM CHLORIDE 0.9 % IV SOLN
2.0000 g | Freq: Once | INTRAVENOUS | Status: AC
  Administered 2019-06-09: 19:00:00 2 g via INTRAVENOUS
  Filled 2019-06-09: qty 20

## 2019-06-09 MED ORDER — IOHEXOL 300 MG/ML  SOLN
100.0000 mL | Freq: Once | INTRAMUSCULAR | Status: AC | PRN
  Administered 2019-06-09: 100 mL via INTRAVENOUS

## 2019-06-09 MED ORDER — METRONIDAZOLE IN NACL 5-0.79 MG/ML-% IV SOLN
500.0000 mg | Freq: Once | INTRAVENOUS | Status: AC
  Administered 2019-06-09: 500 mg via INTRAVENOUS
  Filled 2019-06-09: qty 100

## 2019-06-09 MED ORDER — DIPHENHYDRAMINE HCL 12.5 MG/5ML PO ELIX: 12.5000 mg | ORAL_SOLUTION | Freq: Four times a day (QID) | ORAL | Status: DC | PRN

## 2019-06-09 MED ORDER — HEPARIN SODIUM (PORCINE) 5000 UNIT/ML IJ SOLN
5000.0000 [IU] | Freq: Three times a day (TID) | INTRAMUSCULAR | Status: DC
  Filled 2019-06-09: qty 1

## 2019-06-09 MED ORDER — DIPHENHYDRAMINE HCL 50 MG/ML IJ SOLN: 12.5000 mg | Freq: Four times a day (QID) | INTRAMUSCULAR | Status: DC | PRN

## 2019-06-09 MED ORDER — MORPHINE SULFATE (PF) 4 MG/ML IV SOLN
4.0000 mg | Freq: Once | INTRAVENOUS | Status: AC
  Administered 2019-06-09: 17:00:00 4 mg via INTRAVENOUS
  Filled 2019-06-09: qty 1

## 2019-06-09 MED ORDER — ALBUTEROL SULFATE (2.5 MG/3ML) 0.083% IN NEBU: 3.0000 mL | INHALATION_SOLUTION | Freq: Four times a day (QID) | RESPIRATORY_TRACT | Status: DC | PRN

## 2019-06-09 MED ORDER — ACETAMINOPHEN 500 MG PO TABS
1000.0000 mg | ORAL_TABLET | Freq: Four times a day (QID) | ORAL | Status: DC
  Administered 2019-06-09 – 2019-06-10 (×2): 1000 mg via ORAL
  Filled 2019-06-09 (×2): qty 2

## 2019-06-09 MED ORDER — DOCUSATE SODIUM 100 MG PO CAPS
200.0000 mg | ORAL_CAPSULE | Freq: Two times a day (BID) | ORAL | Status: DC
  Administered 2019-06-10: 200 mg via ORAL
  Filled 2019-06-09 (×2): qty 2

## 2019-06-09 MED ORDER — SIMETHICONE 80 MG PO CHEW
40.0000 mg | CHEWABLE_TABLET | Freq: Four times a day (QID) | ORAL | Status: DC | PRN
  Filled 2019-06-09: qty 1

## 2019-06-09 MED ORDER — ONDANSETRON HCL 4 MG/2ML IJ SOLN
4.0000 mg | Freq: Once | INTRAMUSCULAR | Status: AC
  Administered 2019-06-09: 4 mg via INTRAVENOUS
  Filled 2019-06-09: qty 2

## 2019-06-09 MED ORDER — SODIUM CHLORIDE 0.9% FLUSH: 3.0000 mL | Freq: Once | INTRAVENOUS | Status: DC

## 2019-06-09 NOTE — ED Notes (Signed)
Tried to give report, RN not available right now.

## 2019-06-09 NOTE — ED Triage Notes (Signed)
Pt presents to [facility] for assessment of bladder pressure starting yesterday, some burning with urination.

## 2019-06-09 NOTE — ED Provider Notes (Signed)
Carol Mack    CSN: [id number] Arrival date & time: 06/09/19  1332      History   Chief Complaint Chief Complaint  Patient presents with  . Dysuria    HPI Carol Mack is a 48 y.o. female with history of hypertension presenting for bladder pressure since yesterday.  Patient states that she gets UTIs after drinking sodas, did so the other day.  Denies history of diabetes, polyuria, polydipsia.  Patient is status post laparoscopic hysterectomy in March 2020: Has been doing well postoperatively.  Patient denies fever, chills, malaise, change in bowel or bladder habit.  Patient was treated for trichomonas in September with Flagyl: Reports compliance and completion of antibiotic without adverse effect.  Denies vaginal or pelvic pain.   Past Medical History:  Diagnosis Date  . Asthma   . Bartholin cyst   . Hernia, inguinal   . Hernia, inguinal   . Hypertension     Patient Active Problem List   Diagnosis Date Noted  . S/P laparoscopic hysterectomy 09/15/2018  . Left breast lump 08/07/2018  . Goiter 05/28/2018  . Essential hypertension 05/28/2018  . Cigarette nicotine dependence without complication [id number]  . Multinodular goiter 01/19/2016  . Hyperthyroidism 09/25/2015  . Fibroid, uterine   . Fibroids 06/02/2014  . Fibroid uterus 12/23/2013    Past Surgical History:  Procedure Laterality Date  . fibroid freezing     unsure  . IR GENERIC HISTORICAL  07/27/2014   IR RADIOLOGIST EVAL & MGMT 07/27/2014 Markus Daft, MD [facility] INTERV RAD  . ROBOTIC ASSISTED TOTAL HYSTERECTOMY N/A 09/15/2018   Procedure: XI ROBOTIC ASSISTED TOTAL HYSTERECTOMY WITH SALPINGECTOMY;  Surgeon: Lavonia Drafts, MD;  Location: [facility];  Service: Gynecology;  Laterality: N/A;    OB History    Gravida  2   Para  2   Term  2   Preterm      AB      Living  2     SAB      TAB      Ectopic      Multiple      Live Births  2            Home  Medications    Prior to Admission medications   Medication Sig Start Date End Date Taking? Authorizing Provider  albuterol (PROVENTIL HFA;VENTOLIN HFA) 108 (90 Base) MCG/ACT inhaler Inhale 2 puffs into the lungs every 6 (six) hours as needed for shortness of breath. Patient not taking: Reported on 04/12/2019 10/08/18   Lavonia Drafts, MD  amLODipine (NORVASC) 5 MG tablet Take 1 tablet (5 mg total) by mouth daily. Patient not taking: Reported on 04/12/2019 01/18/19   Billie Ruddy, MD  doxycycline (VIBRA-TABS) 100 MG tablet Take 1 tablet (100 mg total) by mouth 2 (two) times daily. 04/06/19   Lucretia Kern, DO  metroNIDAZOLE (FLAGYL) 500 MG tablet Take 1 tablet (500 mg total) by mouth 2 (two) times daily. No alcohol while taking this medication 04/15/19   Virginia Rochester, NP    Family History Family History  Problem Relation Age of Onset  . Thyroid disease Mother        large benign goiter    Social History Social History   Tobacco Use  . Smoking status: Current Every Day Smoker    Packs/day: 1.00    Types: Cigarettes  . Smokeless tobacco: Never Used  Substance Use Topics  . Alcohol use: No  . Drug use: No  Allergies   Percocet [oxycodone-acetaminophen]   Review of Systems Review of Systems  Constitutional: Negative for fatigue and fever.  HENT: Negative for ear pain, sinus pain, sore throat and voice change.   Eyes: Negative for pain, redness and visual disturbance.  Respiratory: Negative for cough and shortness of breath.   Cardiovascular: Negative for chest pain and palpitations.  Gastrointestinal: Positive for abdominal pain. Negative for diarrhea and vomiting.  Genitourinary: Negative for decreased urine volume, frequency, hematuria, pelvic pain, urgency, vaginal bleeding, vaginal discharge and vaginal pain.  Musculoskeletal: Negative for arthralgias and myalgias.  Skin: Negative for rash and wound.  Neurological: Negative for syncope and headaches.      Physical Exam Triage Vital Signs ED Triage Vitals  Enc Vitals Group     BP 06/09/19 1343 120/89     Pulse Rate 06/09/19 1343 78     Resp 06/09/19 1343 18     Temp 06/09/19 1343 98.6 F (37 C)     Temp Source 06/09/19 1343 Oral     SpO2 06/09/19 1343 97 %     Weight --      Height --      Head Circumference --      Peak Flow --      Pain Score 06/09/19 1344 8     Pain Loc --      Pain Edu? --      Excl. in [place]? --    No data found.  Updated Vital Signs BP 120/89 (BP Location: Right Arm)   Pulse 78   Temp 98.6 F (37 C) (Oral)   Resp 18   SpO2 97%   Visual Acuity Right Eye Distance:   Left Eye Distance:   Bilateral Distance:    Right Eye Near:   Left Eye Near:    Bilateral Near:     Physical Exam Constitutional:      General: She is not in acute distress.    Appearance: She is normal weight. She is not ill-appearing.  HENT:     Head: Normocephalic and atraumatic.     Mouth/Throat:     Mouth: Mucous membranes are moist.  Eyes:     General: No scleral icterus.    Pupils: Pupils are equal, round, and reactive to light.  Cardiovascular:     Rate and Rhythm: Normal rate and regular rhythm.  Pulmonary:     Effort: Pulmonary effort is normal. No respiratory distress.     Breath sounds: No wheezing.  Abdominal:     General: Abdomen is flat. Bowel sounds are normal. There is no distension.     Palpations: Abdomen is soft. There is no hepatomegaly or splenomegaly.     Tenderness: There is abdominal tenderness in the right lower quadrant. There is guarding and rebound. Positive signs include McBurney's sign. Negative signs include Murphy's sign and Rovsing's sign.     Hernia: No hernia is present.    Skin:    Coloration: Skin is not jaundiced or pale.  Neurological:     Mental Status: She is alert and oriented to person, place, and time.      UC Treatments / Results  Labs (all labs ordered are listed, but only abnormal results are displayed) Labs Reviewed   POCT URINALYSIS DIP (MANUAL ENTRY) - Abnormal; Notable for the following components:      Result Value   Spec Grav, UA >=1.030 (*)    All other components within normal limits  POCT URINE PREGNANCY - Normal  EKG   Radiology No results found.  Procedures Procedures (including critical Mack time)  Medications Ordered in UC Medications - No data to display  Initial Impression / Assessment and Plan / UC Course  I have reviewed the triage vital signs and the nursing notes.  Pertinent labs & imaging results that were available during my Mack of the patient were reviewed by me and considered in my medical decision making (see chart for details).     Patient status post hysterectomy: Urine preg deferred.  POC urine dipstick done in office, reviewed by me: Unremarkable except for specific gravity greater than 1.030.  Incidental finding of right lower quadrant tenderness with rebound.  Patient denies history of appendectomy.  Afebrile, nontoxic in office today.  Advised further work-up via imaging such as ultrasound versus CT.  Patient preferring to go through PCP for this to try avoiding the ER.  Wrote down recommendations as outlined below.  ER return precautions discussed, patient verbalized understanding and is agreeable to plan. Final Clinical Impressions(s) / UC Diagnoses   Final diagnoses:  Abdominal pain, RLQ     Discharge Instructions     No sign of UTI today. May use ibuprofen, heating pad as needed for pain. Very important call your PCP to see if they can order stat ultrasound: If you have not heard by end of business this today, or pain becomes significantly worse, you need to go to the ER for further evaluation.    ED Prescriptions    None     PDMP not reviewed this encounter.   [place], [place], [place] 06/09/19 1439

## 2019-06-09 NOTE — ED Notes (Signed)
Patient able to ambulate independently  

## 2019-06-09 NOTE — H&P (Signed)
CC: Right sided abdominal discomofort  Requesting provider: Benedetto Goad, PA-C  HPI: Carol Mack is an 48 y.o. female with hx of HTN, asthma who presented to an urgent care earlier today with right lower abdominal discomfort.  This began this morning and she reports she thought she had a UTI.  She has had this kind of pain before when she had a UTI.  She recently had a hysterectomy but has ovaries in situ.  She reports the pain is being crampy in nature and located in the right lower abdomen.  There is no radiation of the pain.  She denies any fever/chills/nausea/vomiting.  She denies any diarrhea or constipation.  She denies any sick contacts.  She denies any painful urination nor flank pain.  Past Medical History:  Diagnosis Date   Asthma    Bartholin cyst    Hernia, inguinal    Hernia, inguinal    Hypertension     Past Surgical History:  Procedure Laterality Date   fibroid freezing     unsure   IR GENERIC HISTORICAL  07/27/2014   IR RADIOLOGIST EVAL & MGMT 07/27/2014 Markus Daft, MD [facility] INTERV RAD   ROBOTIC ASSISTED TOTAL HYSTERECTOMY N/A 09/15/2018   Procedure: XI ROBOTIC ASSISTED TOTAL HYSTERECTOMY WITH SALPINGECTOMY;  Surgeon: Lavonia Drafts, MD;  Location: [facility];  Service: Gynecology;  Laterality: N/A;    Family History  Problem Relation Age of Onset   Thyroid disease Mother        large benign goiter    Social:  reports that she has been smoking cigarettes. She has been smoking about 1.00 pack per day. She has never used smokeless tobacco. She reports that she does not drink alcohol or use drugs.  Allergies:  Allergies  Allergen Reactions   Percocet [Oxycodone-Acetaminophen] Other (See Comments)    jittery    Medications: I have reviewed the patient's current medications.  Results for orders placed or performed during the hospital encounter of 06/09/19 (from the past 48 hour(s))  Urinalysis, Routine w reflex microscopic      Status: Abnormal   Collection Time: 06/09/19  4:11 PM  Result Value Ref Range   Color, Urine YELLOW YELLOW   APPearance HAZY (A) CLEAR   Specific Gravity, Urine 1.025 1.005 - 1.030   pH 5.0 5.0 - 8.0   Glucose, UA NEGATIVE NEGATIVE mg/dL   Hgb urine dipstick NEGATIVE NEGATIVE   Bilirubin Urine NEGATIVE NEGATIVE   Ketones, ur NEGATIVE NEGATIVE mg/dL   Protein, ur NEGATIVE NEGATIVE mg/dL   Nitrite NEGATIVE NEGATIVE   Leukocytes,Ua NEGATIVE NEGATIVE    Comment: Performed at [facility] [address] [place], [place] [address]  Lipase, blood     Status: None   Collection Time: 06/09/19  4:13 PM  Result Value Ref Range   Lipase 28 11 - 51 U/L    Comment: Performed at [facility] [address] [place], [place] [address]  Comprehensive metabolic panel     Status: Abnormal   Collection Time: 06/09/19  4:13 PM  Result Value Ref Range   Sodium 137 135 - 145 mmol/L   Potassium 4.0 3.5 - 5.1 mmol/L   Chloride 104 98 - 111 mmol/L   CO2 24 22 - 32 mmol/L   Glucose, Bld 123 (H) 70 - 99 mg/dL   BUN 9 6 - 20 mg/dL   Creatinine, Ser 0.83 0.44 - 1.00 mg/dL   Calcium 9.4 8.9 - 10.3 mg/dL   Total  Protein 7.5 6.5 - 8.1 g/dL   Albumin 4.1 3.5 - 5.0 g/dL   AST 13 (L) 15 - 41 U/L   ALT 12 0 - 44 U/L   Alkaline Phosphatase 51 38 - 126 U/L   Total Bilirubin 0.8 0.3 - 1.2 mg/dL   GFR calc non Af Amer >60 >60 mL/min   GFR calc Af Amer >60 >60 mL/min   Anion gap 9 5 - 15    Comment: Performed at [facility] [address] [place], [place] [address]  CBC     Status: Abnormal   Collection Time: 06/09/19  4:13 PM  Result Value Ref Range   WBC 9.1 4.0 - 10.5 K/uL   RBC 5.90 (H) 3.87 - 5.11 MIL/uL   Hemoglobin 15.6 (H) 12.0 - 15.0 g/dL   HCT 43.4 36.0 - 46.0 %   MCV 73.6 (L) 80.0 - 100.0 fL   MCH 26.4 26.0 - 34.0 pg   MCHC 35.9 30.0 - 36.0 g/dL   RDW 14.1 11.5 - 15.5 %   Platelets 274 150 - 400 K/uL   nRBC 0.0 0.0 - 0.2 %    Comment: Performed at [facility], [place] [address], [place], [place] [address]  I-Stat beta hCG blood, ED     Status: None   Collection Time: 06/09/19  4:17 PM  Result Value Ref Range   I-stat hCG, quantitative <5.0 <5 mIU/mL   Comment 3            Comment:   GEST. AGE      CONC.  (mIU/mL)   <=1 WEEK        5 - 50     2 WEEKS       50 - 500     3 WEEKS       100 - 10,000     4 WEEKS     1,000 - 30,000        FEMALE AND NON-PREGNANT FEMALE:     LESS THAN 5 mIU/mL     Ct Abdomen Pelvis W Contrast  Result Date: 06/09/2019 CLINICAL DATA:  Right lower quadrant pain since yesterday. Nausea. EXAM: CT ABDOMEN AND PELVIS WITH CONTRAST TECHNIQUE: Multidetector CT imaging of the abdomen and pelvis was performed using the standard protocol following bolus administration of intravenous contrast. CONTRAST:  131mL OMNIPAQUE IOHEXOL 300 MG/ML  SOLN COMPARISON:  CT 09/17/2011 FINDINGS: Lower chest: Cystic lung disease/emphysema at the bases. No pleural fluid. Hepatobiliary: No focal liver abnormality is seen. No gallstones, gallbladder wall thickening, or biliary dilatation. Pancreas: No ductal dilatation or inflammation. Spleen: Normal in size without focal abnormality. Adrenals/Urinary Tract: Normal adrenal glands. No hydronephrosis or perinephric edema. Homogeneous renal enhancement with symmetric excretion on delayed phase imaging. Urinary bladder is partially distended without wall thickening. Stomach/Bowel: No gastric wall thickening. Fluid/ingested material in the stomach. No obstruction. Pelvic bowel loops are fluid-filled with question of mild hyperemia. Appendix is mildly dilated at 8 mm. Question of mild appendiceal wall hyperemia. Faint fat stranding about the appendiceal tip in the right pelvis, series 3, image 64. Small volume of stool in the proximal colon. More distal colon is nondistended. Sigmoid colon is tortuous. No colonic wall thickening. Vascular/Lymphatic: Mild aortic atherosclerosis. No aneurysm. The portal vein is patent. No  adenopathy. Reproductive: Post hysterectomy. Peripherally enhancing 2.3 cm cyst in the right ovary. Left ovary is not well-defined. No suspicious adnexal mass. Other: Minimal fat stranding in the right lower quadrant and  pelvis. No free air, free fluid, or focal fluid collection. Tiny fat containing supraumbilical ventral abdominal wall hernia. Musculoskeletal: Prominent Schmorl's nodes in superior endplate of L5 and S1, unchanged from prior. There are no acute or suspicious osseous abnormalities. IMPRESSION: 1. Findings suspicious but not definitive for early acute appendicitis. The appendix is mildly dilated with questionable wall hyperemia and faint fat stranding about the appendiceal tip. However there is also hyperemia of pelvic small bowel loops, and appendiceal findings could be related to generalized enteritis. In the setting of right lower quadrant pain, early or tip appendicitis is favored. 2. Peripherally enhancing 2.3 cm right ovarian cyst, likely corpus luteum. Aortic Atherosclerosis (ICD10-I70.0). Electronically Signed   By: Keith Rake M.D.   On: 06/09/2019 18:41    ROS - all of the below systems have been reviewed with the patient and positives are indicated with bold text General: chills, fever or night sweats Eyes: blurry vision or double vision ENT: epistaxis or sore throat Allergy/Immunology: itchy/watery eyes or nasal congestion Hematologic/Lymphatic: bleeding problems, blood clots or swollen lymph nodes Endocrine: temperature intolerance or unexpected weight changes Breast: new or changing breast lumps or nipple discharge Resp: cough, shortness of breath, or wheezing CV: chest pain or dyspnea on exertion GI: as per HPI GU: dysuria, trouble voiding, or hematuria MSK: joint pain or joint stiffness Neuro: TIA or stroke symptoms Derm: pruritus and skin lesion changes Psych: anxiety and depression  PE Blood pressure (!) 137/100, pulse 74, temperature 99.1 F (37.3 C),  temperature source Oral, resp. rate 16, height 5\' 4"  (1.626 m), weight 63.5 kg, SpO2 100 %. Constitutional: NAD; conversant; no deformities; wearing mask Eyes: Moist conjunctiva; no lid lag; anicteric; pupils equal, round Neck: Trachea midline; no thyromegaly Lungs: Normal respiratory effort; no tactile fremitus CV: RRR; no palpable thrills; no pitting edema GI: Abd soft, not significantly tender in any quadrant - reports some discomfort when we press firmly in right lower abd; nondistended; no rebound; no guarding; no palpable hepatosplenomegaly MSK: Normal gait; no clubbing/cyanosis Psychiatric: Appropriate affect; alert and oriented x3 Lymphatic: No palpable cervical or axillary lymphadenopathy  Results for orders placed or performed during the hospital encounter of 06/09/19 (from the past 48 hour(s))  Urinalysis, Routine w reflex microscopic     Status: Abnormal   Collection Time: 06/09/19  4:11 PM  Result Value Ref Range   Color, Urine YELLOW YELLOW   APPearance HAZY (A) CLEAR   Specific Gravity, Urine 1.025 1.005 - 1.030   pH 5.0 5.0 - 8.0   Glucose, UA NEGATIVE NEGATIVE mg/dL   Hgb urine dipstick NEGATIVE NEGATIVE   Bilirubin Urine NEGATIVE NEGATIVE   Ketones, ur NEGATIVE NEGATIVE mg/dL   Protein, ur NEGATIVE NEGATIVE mg/dL   Nitrite NEGATIVE NEGATIVE   Leukocytes,Ua NEGATIVE NEGATIVE    Comment: Performed at [facility], [address] [place], [place] [address]  Lipase, blood     Status: None   Collection Time: 06/09/19  4:13 PM  Result Value Ref Range   Lipase 28 11 - 51 U/L    Comment: Performed at [facility] [address], [place], [place] [address]  Comprehensive metabolic panel     Status: Abnormal   Collection Time: 06/09/19  4:13 PM  Result Value Ref Range   Sodium 137 135 - 145 mmol/L   Potassium 4.0 3.5 - 5.1 mmol/L   Chloride 104 98 - 111 mmol/L   CO2 24 22 - 32 mmol/L   Glucose, Bld 123 (  H) 70 - 99 mg/dL   BUN 9 6 - 20 mg/dL   Creatinine,  Ser 0.83 0.44 - 1.00 mg/dL   Calcium 9.4 8.9 - 10.3 mg/dL   Total Protein 7.5 6.5 - 8.1 g/dL   Albumin 4.1 3.5 - 5.0 g/dL   AST 13 (L) 15 - 41 U/L   ALT 12 0 - 44 U/L   Alkaline Phosphatase 51 38 - 126 U/L   Total Bilirubin 0.8 0.3 - 1.2 mg/dL   GFR calc non Af Amer >60 >60 mL/min   GFR calc Af Amer >60 >60 mL/min   Anion gap 9 5 - 15    Comment: Performed at [facility], [place] [address], [place], [place] [address]  CBC     Status: Abnormal   Collection Time: 06/09/19  4:13 PM  Result Value Ref Range   WBC 9.1 4.0 - 10.5 K/uL   RBC 5.90 (H) 3.87 - 5.11 MIL/uL   Hemoglobin 15.6 (H) 12.0 - 15.0 g/dL   HCT 43.4 36.0 - 46.0 %   MCV 73.6 (L) 80.0 - 100.0 fL   MCH 26.4 26.0 - 34.0 pg   MCHC 35.9 30.0 - 36.0 g/dL   RDW 14.1 11.5 - 15.5 %   Platelets 274 150 - 400 K/uL   nRBC 0.0 0.0 - 0.2 %    Comment: Performed at [facility], [place] [address], [place], [place] [address]  I-Stat beta hCG blood, ED     Status: None   Collection Time: 06/09/19  4:17 PM  Result Value Ref Range   I-stat hCG, quantitative <5.0 <5 mIU/mL   Comment 3            Comment:   GEST. AGE      CONC.  (mIU/mL)   <=1 WEEK        5 - 50     2 WEEKS       50 - 500     3 WEEKS       100 - 10,000     4 WEEKS     1,000 - 30,000        FEMALE AND NON-PREGNANT FEMALE:     LESS THAN 5 mIU/mL     Ct Abdomen Pelvis W Contrast  Result Date: 06/09/2019 CLINICAL DATA:  Right lower quadrant pain since yesterday. Nausea. EXAM: CT ABDOMEN AND PELVIS WITH CONTRAST TECHNIQUE: Multidetector CT imaging of the abdomen and pelvis was performed using the standard protocol following bolus administration of intravenous contrast. CONTRAST:  120mL OMNIPAQUE IOHEXOL 300 MG/ML  SOLN COMPARISON:  CT 09/17/2011 FINDINGS: Lower chest: Cystic lung disease/emphysema at the bases. No pleural fluid. Hepatobiliary: No focal liver abnormality is seen. No gallstones, gallbladder wall thickening, or biliary dilatation. Pancreas: No ductal  dilatation or inflammation. Spleen: Normal in size without focal abnormality. Adrenals/Urinary Tract: Normal adrenal glands. No hydronephrosis or perinephric edema. Homogeneous renal enhancement with symmetric excretion on delayed phase imaging. Urinary bladder is partially distended without wall thickening. Stomach/Bowel: No gastric wall thickening. Fluid/ingested material in the stomach. No obstruction. Pelvic bowel loops are fluid-filled with question of mild hyperemia. Appendix is mildly dilated at 8 mm. Question of mild appendiceal wall hyperemia. Faint fat stranding about the appendiceal tip in the right pelvis, series 3, image 64. Small volume of stool in the proximal colon. More distal colon is nondistended. Sigmoid colon is tortuous. No colonic wall thickening. Vascular/Lymphatic: Mild aortic atherosclerosis. No aneurysm. The portal vein is patent.  No adenopathy. Reproductive: Post hysterectomy. Peripherally enhancing 2.3 cm cyst in the right ovary. Left ovary is not well-defined. No suspicious adnexal mass. Other: Minimal fat stranding in the right lower quadrant and pelvis. No free air, free fluid, or focal fluid collection. Tiny fat containing supraumbilical ventral abdominal wall hernia. Musculoskeletal: Prominent Schmorl's nodes in superior endplate of L5 and S1, unchanged from prior. There are no acute or suspicious osseous abnormalities. IMPRESSION: 1. Findings suspicious but not definitive for early acute appendicitis. The appendix is mildly dilated with questionable wall hyperemia and faint fat stranding about the appendiceal tip. However there is also hyperemia of pelvic small bowel loops, and appendiceal findings could be related to generalized enteritis. In the setting of right lower quadrant pain, early or tip appendicitis is favored. 2. Peripherally enhancing 2.3 cm right ovarian cyst, likely corpus luteum. Aortic Atherosclerosis (ICD10-I70.0). Electronically Signed   By: Keith Rake M.D.    On: 06/09/2019 18:41   A/P: Carol Mack is an 48 y.o. female with RLQ discomfort  -She has been afebrile without chills, no systemic symptoms, Jon Lall blood cell count is normal, CT scan demonstrates mildly dilated appendix with questionable wall hyperemia and very faint stranding at the appendiceal tip, however, also hyperemia of pelvic small bowel loops and additionally a 2.3 cm peripherally enhancing right ovarian cyst favored corpus luteum.  I have also reviewed her CT scan.  The appendiceal lumen appears to contain gas.  I believe that all these findings when taken in whole suggest the probability of appendicitis is quite low.  We discussed options moving forward.  We discussed if we are to proceed with surgery for appendectomy, high probability of a normal appendix at that time.  We discussed observation as an option.  We discussed risks of this including worsening of her symptoms and potentially needing surgery if she were to develop worsening right lower quadrant pain, fever, leukocytosis.  If you are feeling better tomorrow, unlikely that she has appendicitis, potential discharge home without any surgery.  After considering all of her options, she has opted to pursue further observation which I agree is reasonable -We will plan admit her to the hospital, diet tonight, n.p.o. after midnight, repeat labs in the morning.  If feeling better, potential discharge home tomorrow. -We discussed that she would need to follow-up with her gynecologist as well for management/evaluation of her 2.3 cm ovarian cyst   Sharon Mt. Dema Severin, M.D. [facility] Use [email or web address] to contact on call provider

## 2019-06-09 NOTE — Discharge Instructions (Signed)
No sign of UTI today. May use ibuprofen, heating pad as needed for pain. Very important call your PCP to see if they can order stat ultrasound: If you have not heard by end of business this today, or pain becomes significantly worse, you need to go to the ER for further evaluation.

## 2019-06-09 NOTE — ED Triage Notes (Signed)
Pt reports RLQ pain since yesterday, pt sent here from UC to rule out appendicitis. Pt also having some nausea.

## 2019-06-09 NOTE — Telephone Encounter (Signed)
  Spoke with pt advised to immediately go to the ED for further evaluation and imaging per Dr Volanda Napoleon, pt  Agreed to Dr Volanda Napoleon advise state that she will go to the ED. Pt verbalized understanding  Copied from [place] [id number]. Topic: Appointment Scheduling - Scheduling Inquiry for Clinic >> Jun 09, 2019  2:34 PM Alease Frame wrote: Reason for CRM:  Patient is calling in reference to a urgent care visit and a referral . Please advise

## 2019-06-09 NOTE — ED Notes (Signed)
Patient refusing heparin shot and colace dose for tonight, relayed to Dr. Dema Severin and says ok to skip for tonight. No new orders placed.

## 2019-06-09 NOTE — ED Provider Notes (Addendum)
[facility] EMERGENCY DEPARTMENT Provider Note   CSN: [id number] Arrival date & time: 06/09/19  1554     History   Chief Complaint Chief Complaint  Patient presents with  . Abdominal Pain    HPI Carol Mack is a 48 y.o. female.     Carol Mack is a 48 y.o. female with a history of asthma, hypertension, who presents to the ED from urgent care for evaluation of right lower quadrant abdominal pain.  Patient initially presented to urgent care reporting suprapubic and right lower quadrant pain, patient thought that this was related to a UTI.  Pain started yesterday.  She reports pain has been intermittently severe in the suprapubic and right lower quadrants.  She reports some mild nausea but no vomiting, no diarrhea or abnormal bowel movements.  Reports sensation of bladder pressure but no dysuria or urinary frequency.  She denies any fevers or chills.  Reports that she had a robotic laparoscopic hysterectomy in March for uterine fibroids, but denies any other abdominal surgeries.  She denies any vaginal discharge, bleeding or pelvic pain.  Patient was sent from urgent care with concern for appendicitis.  Patient last ate and drank around 3 PM.     Past Medical History:  Diagnosis Date  . Asthma   . Bartholin cyst   . Hernia, inguinal   . Hernia, inguinal   . Hypertension     Patient Active Problem List   Diagnosis Date Noted  . S/P laparoscopic hysterectomy 09/15/2018  . Left breast lump 08/07/2018  . Goiter 05/28/2018  . Essential hypertension 05/28/2018  . Cigarette nicotine dependence without complication [id number]  . Multinodular goiter 01/19/2016  . Hyperthyroidism 09/25/2015  . Fibroid, uterine   . Fibroids 06/02/2014  . Fibroid uterus 12/23/2013    Past Surgical History:  Procedure Laterality Date  . fibroid freezing     unsure  . IR GENERIC HISTORICAL  07/27/2014   IR RADIOLOGIST EVAL & MGMT 07/27/2014 Markus Daft, MD [facility] INTERV RAD  .  ROBOTIC ASSISTED TOTAL HYSTERECTOMY N/A 09/15/2018   Procedure: XI ROBOTIC ASSISTED TOTAL HYSTERECTOMY WITH SALPINGECTOMY;  Surgeon: Lavonia Drafts, MD;  Location: [facility];  Service: Gynecology;  Laterality: N/A;     OB History    Gravida  2   Para  2   Term  2   Preterm      AB      Living  2     SAB      TAB      Ectopic      Multiple      Live Births  2            Home Medications    Prior to Admission medications   Medication Sig Start Date End Date Taking? Authorizing Provider  albuterol (PROVENTIL HFA;VENTOLIN HFA) 108 (90 Base) MCG/ACT inhaler Inhale 2 puffs into the lungs every 6 (six) hours as needed for shortness of breath. Patient not taking: Reported on 04/12/2019 10/08/18   Lavonia Drafts, MD  amLODipine (NORVASC) 5 MG tablet Take 1 tablet (5 mg total) by mouth daily. Patient not taking: Reported on 04/12/2019 01/18/19   Billie Ruddy, MD  doxycycline (VIBRA-TABS) 100 MG tablet Take 1 tablet (100 mg total) by mouth 2 (two) times daily. 04/06/19   Lucretia Kern, DO  metroNIDAZOLE (FLAGYL) 500 MG tablet Take 1 tablet (500 mg total) by mouth 2 (two) times daily. No alcohol while taking this medication 04/15/19  Burleson, Rona Ravens, NP    Family History Family History  Problem Relation Age of Onset  . Thyroid disease Mother        large benign goiter    Social History Social History   Tobacco Use  . Smoking status: Current Every Day Smoker    Packs/day: 1.00    Types: Cigarettes  . Smokeless tobacco: Never Used  Substance Use Topics  . Alcohol use: No  . Drug use: No     Allergies   Percocet [oxycodone-acetaminophen]   Review of Systems Review of Systems  Constitutional: Negative for chills and fever.  HENT: Negative.   Respiratory: Negative for cough and shortness of breath.   Cardiovascular: Negative for chest pain.  Gastrointestinal: Positive for abdominal pain and nausea. Negative for constipation,  diarrhea and vomiting.  Genitourinary: Negative for dysuria, flank pain, frequency, pelvic pain, vaginal bleeding and vaginal discharge.  Musculoskeletal: Negative for arthralgias, back pain and myalgias.  Skin: Negative for color change and rash.  All other systems reviewed and are negative.    Physical Exam Updated Vital Signs BP (!) 111/91 (BP Location: Left Arm)   Pulse 85   Temp 98.7 F (37.1 C)   Resp 18   Ht 5\' 4"  (1.626 m)   Wt 63.5 kg   SpO2 99%   BMI 24.03 kg/m   Physical Exam Vitals signs and nursing note reviewed.  Constitutional:      General: She is not in acute distress.    Appearance: She is well-developed and normal weight. She is not ill-appearing or diaphoretic.  HENT:     Head: Normocephalic and atraumatic.  Eyes:     General:        Right eye: No discharge.        Left eye: No discharge.     Pupils: Pupils are equal, round, and reactive to light.  Neck:     Musculoskeletal: Neck supple.  Cardiovascular:     Rate and Rhythm: Normal rate and regular rhythm.     Heart sounds: Normal heart sounds.  Pulmonary:     Effort: Pulmonary effort is normal. No respiratory distress.     Breath sounds: Normal breath sounds. No wheezing or rales.     Comments: Respirations equal and unlabored, patient able to speak in full sentences, lungs clear to auscultation bilaterally Abdominal:     General: Bowel sounds are normal. There is no distension.     Palpations: Abdomen is soft. There is no mass.     Tenderness: There is abdominal tenderness in the right lower quadrant. There is guarding. Positive signs include McBurney's sign.     Comments: Abdomen is soft and nondistended, bowel sounds are present throughout, there is focal tenderness with guarding in the right lower quadrant, tenderness present at McBurney's point.  No CVA tenderness.  Musculoskeletal:        General: No deformity.  Skin:    General: Skin is warm and dry.     Capillary Refill: Capillary refill  takes less than 2 seconds.  Neurological:     Mental Status: She is alert and oriented to person, place, and time.     Coordination: Coordination normal.     Comments: Speech is clear, able to follow commands Moves extremities without ataxia, coordination intact  Psychiatric:        Mood and Affect: Mood normal.        Behavior: Behavior normal.      ED Treatments / Results  Labs (all labs ordered are listed, but only abnormal results are displayed) Labs Reviewed  COMPREHENSIVE METABOLIC PANEL - Abnormal; Notable for the following components:      Result Value   Glucose, Bld 123 (*)    AST 13 (*)    All other components within normal limits  CBC - Abnormal; Notable for the following components:   RBC 5.90 (*)    Hemoglobin 15.6 (*)    MCV 73.6 (*)    All other components within normal limits  URINALYSIS, ROUTINE W REFLEX MICROSCOPIC - Abnormal; Notable for the following components:   APPearance HAZY (*)    All other components within normal limits  LIPASE, BLOOD  I-STAT BETA HCG BLOOD, ED ([facility], [facility], [facility] ONLY)    EKG None  Radiology Ct Abdomen Pelvis W Contrast  Result Date: 06/09/2019 CLINICAL DATA:  Right lower quadrant pain since yesterday. Nausea. EXAM: CT ABDOMEN AND PELVIS WITH CONTRAST TECHNIQUE: Multidetector CT imaging of the abdomen and pelvis was performed using the standard protocol following bolus administration of intravenous contrast. CONTRAST:  172mL OMNIPAQUE IOHEXOL 300 MG/ML  SOLN COMPARISON:  CT 09/17/2011 FINDINGS: Lower chest: Cystic lung disease/emphysema at the bases. No pleural fluid. Hepatobiliary: No focal liver abnormality is seen. No gallstones, gallbladder wall thickening, or biliary dilatation. Pancreas: No ductal dilatation or inflammation. Spleen: Normal in size without focal abnormality. Adrenals/Urinary Tract: Normal adrenal glands. No hydronephrosis or perinephric edema. Homogeneous renal enhancement with symmetric excretion on delayed phase  imaging. Urinary bladder is partially distended without wall thickening. Stomach/Bowel: No gastric wall thickening. Fluid/ingested material in the stomach. No obstruction. Pelvic bowel loops are fluid-filled with question of mild hyperemia. Appendix is mildly dilated at 8 mm. Question of mild appendiceal wall hyperemia. Faint fat stranding about the appendiceal tip in the right pelvis, series 3, image 64. Small volume of stool in the proximal colon. More distal colon is nondistended. Sigmoid colon is tortuous. No colonic wall thickening. Vascular/Lymphatic: Mild aortic atherosclerosis. No aneurysm. The portal vein is patent. No adenopathy. Reproductive: Post hysterectomy. Peripherally enhancing 2.3 cm cyst in the right ovary. Left ovary is not well-defined. No suspicious adnexal mass. Other: Minimal fat stranding in the right lower quadrant and pelvis. No free air, free fluid, or focal fluid collection. Tiny fat containing supraumbilical ventral abdominal wall hernia. Musculoskeletal: Prominent Schmorl's nodes in superior endplate of L5 and S1, unchanged from prior. There are no acute or suspicious osseous abnormalities. IMPRESSION: 1. Findings suspicious but not definitive for early acute appendicitis. The appendix is mildly dilated with questionable wall hyperemia and faint fat stranding about the appendiceal tip. However there is also hyperemia of pelvic small bowel loops, and appendiceal findings could be related to generalized enteritis. In the setting of right lower quadrant pain, early or tip appendicitis is favored. 2. Peripherally enhancing 2.3 cm right ovarian cyst, likely corpus luteum. Aortic Atherosclerosis (ICD10-I70.0). Electronically Signed   By: Keith Rake M.D.   On: 06/09/2019 18:41    Procedures Procedures (including critical care time)  Medications Ordered in ED Medications  sodium chloride flush (NS) 0.9 % injection 3 mL (has no administration in time range)  cefTRIAXone  (ROCEPHIN) 2 g in sodium chloride 0.9 % 100 mL IVPB (has no administration in time range)    And  metroNIDAZOLE (FLAGYL) IVPB 500 mg (has no administration in time range)  sodium chloride 0.9 % bolus 1,000 mL (0 mLs Intravenous Stopped 06/09/19 1909)  ondansetron (ZOFRAN) injection 4 mg (4 mg Intravenous Given 06/09/19  1647)  morphine 4 MG/ML injection 4 mg (4 mg Intravenous Given 06/09/19 1645)  iohexol (OMNIPAQUE) 300 MG/ML solution 100 mL (100 mLs Intravenous Contrast Given 06/09/19 1807)     Initial Impression / Assessment and Plan / ED Course  I have reviewed the triage vital signs and the nursing notes.  Pertinent labs & imaging results that were available during my care of the patient were reviewed by me and considered in my medical decision making (see chart for details).  48 year old female sent from urgent care with right lower quadrant pain to rule out appendicitis.  Symptoms started yesterday and patient initially thought this was urinary tract infection, but UA at urgent care was clear and patient was noted to have right lower quadrant pain with peritoneal signs on exam.  On arrival she has normal vitals and is overall well-appearing.  She is focally tender at McBurney's point on exam with some guarding.  She denies associated urinary symptoms, has had some mild nausea, no vomiting and no fevers.  Will get abdominal labs and CT abdomen pelvis, IV fluids, pain and nausea medication given.  Labs overall reassuring, no leukocytosis, hemoglobin slightly elevated, no significant electrolyte derangements, normal renal and liver function and normal lipase.  Urinalysis clear.  CT scan suggests early appendicitis or tip appendicitis the appendix is mildly dilated with some surrounding fat stranding, they also question whether this could be enteritis but I feel this is less likely as patient has had no diarrhea and only mild nausea.  Will discuss with general surgery.  7:30 PM Case discussed  with Dr. Dema Severin with general surgery who will see the patient, given that patient does not have leukocytosis or fever and has only pain with mild signs of appendicitis on CT, plans to admit for observation overnight, feels pt may not require surgery.  Final Clinical Impressions(s) / ED Diagnoses   Final diagnoses:  RLQ abdominal pain    ED Discharge Orders    None       Jacqlyn Larsen, PA-C 06/09/19 1935    Janet Berlin 06/09/19 2030    Margette Fast, MD 06/11/19 1357

## 2019-06-10 ENCOUNTER — Encounter (HOSPITAL_COMMUNITY): Payer: Self-pay

## 2019-06-10 LAB — CBC WITH DIFFERENTIAL/PLATELET
Abs Immature Granulocytes: 0.01 10*3/uL (ref 0.00–0.07)
Basophils Absolute: 0 10*3/uL (ref 0.0–0.1)
Basophils Relative: 0 %
Eosinophils Absolute: 0.1 10*3/uL (ref 0.0–0.5)
Eosinophils Relative: 1 %
HCT: 37.7 % (ref 36.0–46.0)
Hemoglobin: 13.7 g/dL (ref 12.0–15.0)
Immature Granulocytes: 0 %
Lymphocytes Relative: 38 %
Lymphs Abs: 2.8 10*3/uL (ref 0.7–4.0)
MCH: 26.3 pg (ref 26.0–34.0)
MCHC: 36.3 g/dL — ABNORMAL HIGH (ref 30.0–36.0)
MCV: 72.5 fL — ABNORMAL LOW (ref 80.0–100.0)
Monocytes Absolute: 0.9 10*3/uL (ref 0.1–1.0)
Monocytes Relative: 12 %
Neutro Abs: 3.6 10*3/uL (ref 1.7–7.7)
Neutrophils Relative %: 49 %
Platelets: 231 10*3/uL (ref 150–400)
RBC: 5.2 MIL/uL — ABNORMAL HIGH (ref 3.87–5.11)
RDW: 14 % (ref 11.5–15.5)
WBC: 7.5 10*3/uL (ref 4.0–10.5)
nRBC: 0 % (ref 0.0–0.2)

## 2019-06-10 LAB — SARS CORONAVIRUS 2 (TAT 6-24 HRS): SARS Coronavirus 2: NEGATIVE

## 2019-06-10 MED ORDER — PNEUMOCOCCAL VAC POLYVALENT 25 MCG/0.5ML IJ INJ: 0.5000 mL | INJECTION | INTRAMUSCULAR | Status: DC | PRN

## 2019-06-10 MED ORDER — IBUPROFEN 600 MG PO TABS
600.0000 mg | ORAL_TABLET | Freq: Four times a day (QID) | ORAL | Status: DC | PRN
  Administered 2019-06-10: 600 mg via ORAL
  Filled 2019-06-10: qty 1

## 2019-06-10 NOTE — TOC Transition Note (Signed)
Transition of Care [facility]) - CM/SW Discharge Note   Patient Details  Name: Carol Mack MRN: [id number] Date of Birth: 06-03-1971  [facility] CM/SW Contact:  Pollie Friar, RN Phone Number: 06/10/2019, 12:10 PM   Clinical Narrative:    Pt discharged home with self care. Pt had transportation home.    Final next level of care: Home/Self Care Barriers to Discharge: No Barriers Identified   Patient Goals and CMS Choice        Discharge Placement                       Discharge Plan and Services                                     Social Determinants of Health (SDOH) Interventions     Readmission Risk Interventions No flowsheet data found.

## 2019-06-10 NOTE — Progress Notes (Signed)
Subjective/Chief Complaint: Abdominal pain improved.  Main complaint this AM is headache.  Did not require any meds for abdomen and no nausea.  Tylenol that she took for headache several hours ago has not resolved headache.     Objective: Vital signs in last 24 hours: Temp:  [98.1 F (36.7 C)-99.1 F (37.3 C)] 98.1 F (36.7 C) (11/26 0538) Pulse Rate:  [64-85] 71 (11/26 0538) Resp:  [16-18] 18 (11/26 0538) BP: (111-137)/(82-100) 129/94 (11/26 0538) SpO2:  [97 %-100 %] 100 % (11/26 0538) Weight:  [63.5 kg] 63.5 kg (11/25 2312) Last BM Date: 06/09/19  Intake/Output from previous day: 11/25 0701 - 11/26 0700 In: 1640 [P.O.:120; I.V.:500; IV Piggyback:1020] Out: -  Intake/Output this shift: No intake/output data recorded.  General appearance: alert, cooperative and mild distress Resp: breathing comfortably GI: soft, non distended, non tender.   Extremities: extremities normal, atraumatic, no cyanosis or edema  Lab Results:  Recent Labs    06/09/19 1613 06/10/19 0123  WBC 9.1 7.5  HGB 15.6* 13.7  HCT 43.4 37.7  PLT 274 231   BMET Recent Labs    06/09/19 1613  NA 137  K 4.0  CL 104  CO2 24  GLUCOSE 123*  BUN 9  CREATININE 0.83  CALCIUM 9.4   PT/INR No results for input(s): LABPROT, INR in the last 72 hours. ABG No results for input(s): PHART, HCO3 in the last 72 hours.  Invalid input(s): PCO2, PO2  Studies/Results: Ct Abdomen Pelvis W Contrast  Result Date: 06/09/2019 CLINICAL DATA:  Right lower quadrant pain since yesterday. Nausea. EXAM: CT ABDOMEN AND PELVIS WITH CONTRAST TECHNIQUE: Multidetector CT imaging of the abdomen and pelvis was performed using the standard protocol following bolus administration of intravenous contrast. CONTRAST:  155mL OMNIPAQUE IOHEXOL 300 MG/ML  SOLN COMPARISON:  CT 09/17/2011 FINDINGS: Lower chest: Cystic lung disease/emphysema at the bases. No pleural fluid. Hepatobiliary: No focal liver abnormality is seen. No  gallstones, gallbladder wall thickening, or biliary dilatation. Pancreas: No ductal dilatation or inflammation. Spleen: Normal in size without focal abnormality. Adrenals/Urinary Tract: Normal adrenal glands. No hydronephrosis or perinephric edema. Homogeneous renal enhancement with symmetric excretion on delayed phase imaging. Urinary bladder is partially distended without wall thickening. Stomach/Bowel: No gastric wall thickening. Fluid/ingested material in the stomach. No obstruction. Pelvic bowel loops are fluid-filled with question of mild hyperemia. Appendix is mildly dilated at 8 mm. Question of mild appendiceal wall hyperemia. Faint fat stranding about the appendiceal tip in the right pelvis, series 3, image 64. Small volume of stool in the proximal colon. More distal colon is nondistended. Sigmoid colon is tortuous. No colonic wall thickening. Vascular/Lymphatic: Mild aortic atherosclerosis. No aneurysm. The portal vein is patent. No adenopathy. Reproductive: Post hysterectomy. Peripherally enhancing 2.3 cm cyst in the right ovary. Left ovary is not well-defined. No suspicious adnexal mass. Other: Minimal fat stranding in the right lower quadrant and pelvis. No free air, free fluid, or focal fluid collection. Tiny fat containing supraumbilical ventral abdominal wall hernia. Musculoskeletal: Prominent Schmorl's nodes in superior endplate of L5 and S1, unchanged from prior. There are no acute or suspicious osseous abnormalities. IMPRESSION: 1. Findings suspicious but not definitive for early acute appendicitis. The appendix is mildly dilated with questionable wall hyperemia and faint fat stranding about the appendiceal tip. However there is also hyperemia of pelvic small bowel loops, and appendiceal findings could be related to generalized enteritis. In the setting of right lower quadrant pain, early or tip appendicitis is favored. 2. Peripherally enhancing  2.3 cm right ovarian cyst, likely corpus luteum.  Aortic Atherosclerosis (ICD10-I70.0). Electronically Signed   By: Keith Rake M.D.   On: 06/09/2019 18:41    Anti-infectives: Anti-infectives (From admission, onward)   Start     Dose/Rate Route Frequency Ordered Stop   06/09/19 1900  cefTRIAXone (ROCEPHIN) 2 g in sodium chloride 0.9 % 100 mL IVPB     2 g 200 mL/hr over 30 Minutes Intravenous  Once 06/09/19 1852 06/09/19 1954   06/09/19 1900  metroNIDAZOLE (FLAGYL) IVPB 500 mg     500 mg 100 mL/hr over 60 Minutes Intravenous  Once 06/09/19 1852 06/09/19 1954      Assessment/Plan: s/p * No surgery found * RLQ pain  Headache   WBCs remain normal.  No fevers. Abdominal pain resolved Advance diet Ibuprofen for headache. Home later today if tolerates diet.     LOS: 0 days    Carol Mack 06/10/2019

## 2019-06-10 NOTE — Progress Notes (Signed)
Patient discharged to home with instructions. 

## 2019-06-10 NOTE — Discharge Instructions (Signed)
Abdominal Pain, Adult Abdominal pain can be caused by many things. Often, abdominal pain is not serious and it gets better with no treatment or by being treated at home. However, sometimes abdominal pain is serious. Your health care provider will do a medical history and a physical exam to try to determine the cause of your abdominal pain. Follow these instructions at home:  Take over-the-counter and prescription medicines only as told by your health care provider. Do not take a laxative unless told by your health care provider.  Drink enough fluid to keep your urine clear or pale yellow.  Watch your condition for any changes.  Keep all follow-up visits as told by your health care provider. This is important. Contact a health care provider if:  Your abdominal pain changes or gets worse.  You are not hungry or you lose weight without trying.  You are constipated or have diarrhea for more than 2-3 days.  You have pain when you urinate or have a bowel movement.  Your abdominal pain wakes you up at night.  Your pain gets worse with meals, after eating, or with certain foods.  You are throwing up and cannot keep anything down.  You have a fever. Get help right away if:  Your pain does not go away as soon as your health care provider told you to expect.  You cannot stop throwing up.  Your pain is only in areas of the abdomen, such as the right side or the left lower portion of the abdomen.  You have bloody or black stools, or stools that look like tar.  You have severe pain, cramping, or bloating in your abdomen.  You have signs of dehydration, such as: ? Dark urine, very little urine, or no urine. ? Cracked lips. ? Dry mouth. ? Sunken eyes. ? Sleepiness. ? Weakness. This information is not intended to replace advice given to you by your health care provider. Make sure you discuss any questions you have with your health care provider. Document Released: 04/10/2005 Document  Revised: 01/19/2016 Document Reviewed: 12/13/2015 Elsevier Interactive Patient Education  2020 [employer] Inc[employer]  

## 2019-06-10 NOTE — Progress Notes (Signed)
Pt arrived to unit, ambulated to her bed. Pt is alert and oriented x4. With 3/10 abdominal pain, refuses pain medication. Oriented pt to care and use of call light. Will monitor pt.

## 2019-06-13 NOTE — Discharge Summary (Signed)
Physician Discharge Summary  Patient ID: Carol Mack MRN: [id number] DOB/AGE: [birth year] 48 y.o.  Admit date: 06/09/2019 Discharge date: 06/13/2019  Admission Diagnoses: RLQ abdominal pain. HTN   Discharge Diagnoses:  Active Problems:   Abdominal pain   Discharged Condition: improved.    Hospital Course:  Pt was admitted from the ED to the floor with RLQ abdominal pain and a CT that showed an ovarian cyst, a possibly abnormal appendix (but with air in it), and hyperemia of some small bowel loops.  She was not placed on antibiotics in order to maximize our ability to tell if she worsened or not.  She did not have fevers and her abdominal pain resolved overnight.  She had a normal white count throughout.  She was discharged to home after tolerating a diet.    Consults: None  Significant Diagnostic Studies: labs: WBCs 7.5k, CT described above.  Treatments: observation and hydration  Discharge Exam: Blood pressure (!) 129/94, pulse 71, temperature 98.1 F (36.7 C), temperature source Oral, resp. rate 18, height 5\' 5"  (1.651 m), weight 63.5 kg, SpO2 100 %. General appearance: alert, cooperative and no distress Resp: breathing comfortably GI: soft, non tender, non distended. Extremities: extremities normal, atraumatic, no cyanosis or edema  Disposition:   Discharge Instructions    Diet - low sodium heart healthy   Complete by: As directed    Increase activity slowly   Complete by: As directed      Allergies as of 06/10/2019      Reactions   Percocet [oxycodone-acetaminophen] Other (See Comments)   jittery      Medication List    TAKE these medications   albuterol 108 (90 Base) MCG/ACT inhaler Commonly known as: VENTOLIN HFA Inhale 2 puffs into the lungs every 6 (six) hours as needed for shortness of breath.   amLODipine 5 MG tablet Commonly known as: NORVASC Take 1 tablet (5 mg total) by mouth daily.      Follow-up Information    Billie Ruddy, MD  Follow up.   Specialty: Family Medicine Why: as needed Contact information: [place] [address] [phone]           Signed: Stark Klein 06/13/2019, 8:41 AM

## 2019-06-14 ENCOUNTER — Telehealth: Payer: Self-pay | Admitting: Lactation Services

## 2019-06-14 NOTE — Telephone Encounter (Signed)
Attempted to call pt at her request. Pt did not answer. LM for pt to call the office at her earliest convenience. Will send pt a MyChart message also.

## 2019-06-17 ENCOUNTER — Encounter: Payer: Self-pay | Admitting: Family Medicine

## 2019-06-17 ENCOUNTER — Other Ambulatory Visit: Payer: Self-pay

## 2019-06-17 ENCOUNTER — Ambulatory Visit (INDEPENDENT_AMBULATORY_CARE_PROVIDER_SITE_OTHER): Payer: 59 | Admitting: Family Medicine

## 2019-06-17 VITALS — BP 156/87 | HR 77 | Ht 64.0 in | Wt 151.0 lb

## 2019-06-17 DIAGNOSIS — N83201 Unspecified ovarian cyst, right side: Secondary | ICD-10-CM

## 2019-06-17 NOTE — Progress Notes (Signed)
   Subjective:    Patient ID: Scot Jun, female    DOB: [birth year], 48 y.o.   MRN: [id number]  HPI Patient seen for follow up of RLQ pain. Was admitted for concerns of appendicitis. She was not placed on antibiotics and her RLQ pain improved. Her pain is completely resolved. Her CT scan also showed a 2.3 cm right ovarian corpus luteum cyst.   Review of Systems     Objective:   Physical Exam Constitutional:      Appearance: Normal appearance.  Cardiovascular:     Rate and Rhythm: Normal rate.  Pulmonary:     Effort: Pulmonary effort is normal.  Abdominal:     General: Abdomen is flat.     Palpations: Abdomen is soft. There is no mass.     Tenderness: There is no abdominal tenderness. There is no guarding or rebound.     Hernia: No hernia is present.  Neurological:     Mental Status: She is alert.        Assessment & Plan:  1. Cyst of right ovary Benign. Pain resolved. Patient reassured. Follow up as needed.

## 2019-08-09 ENCOUNTER — Telehealth: Payer: Self-pay | Admitting: Family Medicine

## 2019-08-09 NOTE — Telephone Encounter (Signed)
Call returned to pt and discussed her concern. She states she is having vaginal irritation, itching, discharge and odor. She further states these are the same symptoms she had @ her last office visit (04/12/19). Per chart review, pt was positive for Trich and yeast at that visit.  Pt currently has nurse appt scheduled on 2/3 however would like appt sooner. Appt changed to tomorrow @ [phone]. Pt voiced understanding and agreed to appt.

## 2019-08-09 NOTE — Telephone Encounter (Signed)
The patient stated she thinks she may have a possible bacteria infection. Informed the patient a message will be sent to the nurses station her appointment is 2/3.

## 2019-08-10 ENCOUNTER — Other Ambulatory Visit: Payer: Self-pay

## 2019-08-10 ENCOUNTER — Ambulatory Visit (INDEPENDENT_AMBULATORY_CARE_PROVIDER_SITE_OTHER): Payer: 59

## 2019-08-10 DIAGNOSIS — N898 Other specified noninflammatory disorders of vagina: Secondary | ICD-10-CM

## 2019-08-10 DIAGNOSIS — N760 Acute vaginitis: Secondary | ICD-10-CM | POA: Diagnosis not present

## 2019-08-10 DIAGNOSIS — B9689 Other specified bacterial agents as the cause of diseases classified elsewhere: Secondary | ICD-10-CM | POA: Diagnosis not present

## 2019-08-10 DIAGNOSIS — B373 Candidiasis of vulva and vagina: Secondary | ICD-10-CM | POA: Diagnosis not present

## 2019-08-10 DIAGNOSIS — Z113 Encounter for screening for infections with a predominantly sexual mode of transmission: Secondary | ICD-10-CM | POA: Diagnosis not present

## 2019-08-10 NOTE — Progress Notes (Signed)
Pt here today with complaints of vaginal itching and vaginal discharge with an odor. Pt reports hx of trichomonas in Sep 2020. States that she is concerned her partner did not complete antibiotic course. Self-swab instructions given and specimen obtained. Recommended to pt that she not have intercourse until she knows the results of her swab. Explained to pt we will call with any abnormal results and that she may follow up with the office if she does not hear from [place] but is still experiencing symptoms. Pt would like to know if there is anything she can do to relieve itching in the meantime. Information included in wrap up for non-prescription treatment of BV and yeast. Explained that we do not want to treat a yeast infection before any other infections because it may just return while taking antibiotics. Pt encouraged to call or message with any questions.   Apolonio Schneiders RN 08/10/19

## 2019-08-10 NOTE — Patient Instructions (Addendum)
Option #1  1 Tbsp Fractitionated Coconut Oil  10 drops of Melaleuca (Tea Tree) Oil   Mix ingredients together well. Soak 3-4 tampons (in applicators) in that mixture until all or mostly all mixture is soaked up into the tampons. Insert 1 saturated tampon vaginally and wear overnight for 3-4 nights.    Option #2 (sometimes to be used in conjunction with option #1)  Fill tub with enough to cover lap/lower abdomen warm water. Mix 1/2 cup of baking soda in water. Soak in water/baking soda mixture for at least 20 minutes. Be sure to swish water in between legs to get as much in vagina as possible. This soak should be done after sexual intercourse and menstrual cycles.    Option #3  The Honey Pot Boric Acid & Herbs Suppositories (sold at [employer], [facility], [facility], [facility])  You can purchase Tea Tree Oil locally at:   [employer]  [address] [address]   [employer]  [place] [address]

## 2019-08-10 NOTE — Progress Notes (Signed)
Patient seen and assessed by nursing staff during this encounter. I have reviewed the chart and agree with the documentation and plan.  Kerry Hough, PA-C 08/10/2019 11:21 AM

## 2019-08-11 ENCOUNTER — Other Ambulatory Visit: Payer: Self-pay | Admitting: Medical

## 2019-08-11 DIAGNOSIS — B379 Candidiasis, unspecified: Secondary | ICD-10-CM

## 2019-08-11 DIAGNOSIS — N760 Acute vaginitis: Secondary | ICD-10-CM

## 2019-08-11 DIAGNOSIS — A599 Trichomoniasis, unspecified: Secondary | ICD-10-CM

## 2019-08-11 DIAGNOSIS — B9689 Other specified bacterial agents as the cause of diseases classified elsewhere: Secondary | ICD-10-CM

## 2019-08-11 LAB — CERVICOVAGINAL ANCILLARY ONLY
Bacterial Vaginitis (gardnerella): POSITIVE — AB
Candida Glabrata: NEGATIVE
Candida Vaginitis: POSITIVE — AB
Chlamydia: NEGATIVE
Comment: NEGATIVE
Comment: NEGATIVE
Comment: NEGATIVE
Comment: NEGATIVE
Comment: NEGATIVE
Comment: NORMAL
Neisseria Gonorrhea: NEGATIVE
Trichomonas: POSITIVE — AB

## 2019-08-11 MED ORDER — FLUCONAZOLE 150 MG PO TABS: 150.0000 mg | ORAL_TABLET | Freq: Every day | ORAL | 0 refills | Status: DC

## 2019-08-11 MED ORDER — METRONIDAZOLE 500 MG PO TABS: 500.0000 mg | ORAL_TABLET | Freq: Two times a day (BID) | ORAL | 0 refills | Status: DC

## 2019-08-18 ENCOUNTER — Ambulatory Visit: Payer: 59

## 2019-08-19 ENCOUNTER — Emergency Department (HOSPITAL_COMMUNITY)
Admission: EM | Admit: 2019-08-19 | Discharge: 2019-08-19 | Disposition: A | Discharge status: Home / Self Care | Payer: 59 | Attending: Emergency Medicine | Admitting: Emergency Medicine

## 2019-08-19 ENCOUNTER — Other Ambulatory Visit: Payer: Self-pay

## 2019-08-19 ENCOUNTER — Emergency Department (HOSPITAL_COMMUNITY): Payer: 59

## 2019-08-19 DIAGNOSIS — X509XXA Other and unspecified overexertion or strenuous movements or postures, initial encounter: Secondary | ICD-10-CM | POA: Diagnosis not present

## 2019-08-19 DIAGNOSIS — Y9289 Other specified places as the place of occurrence of the external cause: Secondary | ICD-10-CM | POA: Diagnosis not present

## 2019-08-19 DIAGNOSIS — S3992XA Unspecified injury of lower back, initial encounter: Secondary | ICD-10-CM | POA: Diagnosis present

## 2019-08-19 DIAGNOSIS — S39012A Strain of muscle, fascia and tendon of lower back, initial encounter: Secondary | ICD-10-CM

## 2019-08-19 DIAGNOSIS — Z79899 Other long term (current) drug therapy: Secondary | ICD-10-CM | POA: Diagnosis not present

## 2019-08-19 DIAGNOSIS — Y9389 Activity, other specified: Secondary | ICD-10-CM | POA: Insufficient documentation

## 2019-08-19 DIAGNOSIS — M47817 Spondylosis without myelopathy or radiculopathy, lumbosacral region: Secondary | ICD-10-CM | POA: Insufficient documentation

## 2019-08-19 DIAGNOSIS — F17210 Nicotine dependence, cigarettes, uncomplicated: Secondary | ICD-10-CM | POA: Insufficient documentation

## 2019-08-19 DIAGNOSIS — Y990 Civilian activity done for income or pay: Secondary | ICD-10-CM | POA: Diagnosis not present

## 2019-08-19 DIAGNOSIS — I10 Essential (primary) hypertension: Secondary | ICD-10-CM | POA: Diagnosis not present

## 2019-08-19 MED ORDER — LIDOCAINE 5 % EX PTCH
1.0000 | MEDICATED_PATCH | CUTANEOUS | Status: DC
  Administered 2019-08-19: 1 via TRANSDERMAL
  Filled 2019-08-19: qty 1

## 2019-08-19 MED ORDER — LIDOCAINE 5 % EX PTCH: 1.0000 | MEDICATED_PATCH | CUTANEOUS | 0 refills | Status: DC

## 2019-08-19 NOTE — Discharge Instructions (Addendum)
Notify your employer of your back injury, follow-up with your Worker's Comp. Provider.  Warm compresses to low back for 20 minutes at a time followed by gentle stretching. You may continue with the ibuprofen and Tylenol as needed as directed. Apply Lidoderm patch to low back as prescribed.

## 2019-08-19 NOTE — ED Provider Notes (Signed)
[facility] EMERGENCY DEPARTMENT Provider Note   CSN: [id number] Arrival date & time: 08/19/19  0751     History Chief Complaint  Patient presents with  . Back Pain    Carol Mack is a 49 y.o. female.  49 year old female presents with complaint of low back pain x1 week.  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  Pain does not radiate, is worse with movement and bending, specifically bending over towards her feet.  Patient has been taking ibuprofen, applying CBD oil, wearing a back brace without any improvement in her pain.  Patient denies loss of bowel or bladder control, abdominal pain, leg weakness or numbness.  Denies prior back injuries.  No other complaints or concerns.        Past Medical History:  Diagnosis Date  . Asthma   . Bartholin cyst   . Hernia, inguinal   . Hernia, inguinal   . Hypertension     Patient Active Problem List   Diagnosis Date Noted  . Abdominal pain 06/09/2019  . S/P laparoscopic hysterectomy 09/15/2018  . Left breast lump 08/07/2018  . Goiter 05/28/2018  . Essential hypertension 05/28/2018  . Cigarette nicotine dependence without complication 99991111  . Multinodular goiter 01/19/2016  . Hyperthyroidism 09/25/2015  . Fibroid, uterine   . Fibroids 06/02/2014  . Fibroid uterus 12/23/2013    Past Surgical History:  Procedure Laterality Date  . fibroid freezing     unsure  . IR GENERIC HISTORICAL  07/27/2014   IR RADIOLOGIST EVAL & MGMT 07/27/2014 Markus Daft, MD [facility] INTERV RAD  . ROBOTIC ASSISTED TOTAL HYSTERECTOMY N/A 09/15/2018   Procedure: XI ROBOTIC ASSISTED TOTAL HYSTERECTOMY WITH SALPINGECTOMY;  Surgeon: Lavonia Drafts, MD;  Location: [facility];  Service: Gynecology;  Laterality: N/A;     OB History    Gravida  2   Para  2   Term  2   Preterm      AB      Living  2     SAB      TAB      Ectopic      Multiple      Live Births  2           Family History  Problem Relation Age of Onset  . Thyroid disease Mother        large benign goiter    Social History   Tobacco Use  . Smoking status: Current Every Day Smoker    Packs/day: 1.00    Types: Cigarettes  . Smokeless tobacco: Never Used  Substance Use Topics  . Alcohol use: No  . Drug use: No    Home Medications Prior to Admission medications   Medication Sig Start Date End Date Taking? Authorizing Provider  albuterol (PROVENTIL HFA;VENTOLIN HFA) 108 (90 Base) MCG/ACT inhaler Inhale 2 puffs into the lungs every 6 (six) hours as needed for shortness of breath. 10/08/18   Lavonia Drafts, MD  amLODipine (NORVASC) 5 MG tablet Take 1 tablet (5 mg total) by mouth daily. 01/18/19   Billie Ruddy, MD  fluconazole (DIFLUCAN) 150 MG tablet Take 1 tablet (150 mg total) by mouth daily. 08/11/19   Luvenia Redden, PA-C  lidocaine (LIDODERM) 5 % Place 1 patch onto the skin daily. Remove & Discard patch within 12 hours or as directed by MD 08/19/19  Tacy Learn, PA-C  metroNIDAZOLE (FLAGYL) 500 MG tablet Take 1 tablet (500 mg total) by mouth 2 (two) times daily. 08/11/19   Luvenia Redden, PA-C    Allergies    Percocet [oxycodone-acetaminophen]  Review of Systems   Review of Systems  Constitutional: Negative for chills and fever.  Gastrointestinal: Negative for abdominal pain.  Genitourinary: Negative for difficulty urinating.  Musculoskeletal: Positive for back pain.  Skin: Negative for rash and wound.  Allergic/Immunologic: Negative for immunocompromised state.  Neurological: Negative for weakness and numbness.  Hematological: Does not bruise/bleed easily.  Psychiatric/Behavioral: Negative for confusion.  All other systems reviewed and are negative.   Physical Exam Updated Vital Signs BP 136/84 (BP Location: Right Arm) Comment: Simultaneous filing. User may not have seen  previous data.  Pulse 83 Comment: Simultaneous filing. User may not have seen previous data.  Temp 98.2 F (36.8 C) (Oral)   Resp 14   Ht 5\' 4"  (1.626 m)   Wt 63.5 kg   LMP  (LMP Unknown)   SpO2 99% Comment: Simultaneous filing. User may not have seen previous data.  BMI 24.03 kg/m   Physical Exam Vitals and nursing note reviewed.  Constitutional:      General: She is not in acute distress.    Appearance: She is well-developed. She is not diaphoretic.  HENT:     Head: Normocephalic and atraumatic.  Cardiovascular:     Pulses: Normal pulses.  Pulmonary:     Effort: Pulmonary effort is normal.  Abdominal:     Palpations: Abdomen is soft.     Tenderness: There is no abdominal tenderness.  Musculoskeletal:        General: No tenderness or deformity.       Back:     Right lower leg: No edema.     Left lower leg: No edema.  Skin:    General: Skin is warm and dry.     Findings: No erythema or rash.  Neurological:     Mental Status: She is alert and oriented to person, place, and time.     Sensory: Sensation is intact. No sensory deficit.     Motor: No weakness.     Deep Tendon Reflexes:     Reflex Scores:      Patellar reflexes are 1+ on the right side and 1+ on the left side. Psychiatric:        Behavior: Behavior normal.     ED Results / Procedures / Treatments   Labs (all labs ordered are listed, but only abnormal results are displayed) Labs Reviewed - No data to display  EKG None  Radiology DG Lumbar Spine Complete  Result Date: 08/19/2019 CLINICAL DATA:  Low back pain for 1 week, no radiculopathy EXAM: LUMBAR SPINE - COMPLETE 4+ VIEW COMPARISON:  09/14/2004 FINDINGS: Frontal, bilateral oblique, lateral views of the lumbar spine are obtained. There are 5 non-rib-bearing lumbar type vertebral bodies in grossly anatomic alignment. There is mild facet hypertrophy at L5/S1. Disc spaces are relatively well preserved. No fractures. Sacroiliac joints are normal.  IMPRESSION: Mild facet hypertrophy at L5/S1. Electronically Signed   By: Randa Ngo M.D.   On: 08/19/2019 08:46    Procedures Procedures (including critical care time)  Medications Ordered in ED Medications  lidocaine (LIDODERM) 5 % 1 patch (1 patch Transdermal Patch Applied 08/19/19 0847)    ED Course  I have reviewed the triage vital signs and the nursing notes.  Pertinent labs & imaging results that were  available during my care of the patient were reviewed by me and considered in my medical decision making (see chart for details).  Clinical Course as of Aug 18 854  Thu Aug 19, 6123  5532 49 year old female presents with complaint of low back pain after lifting something heavy at work 1 week ago, not improving with NSAIDs and back brace.  On exam patient has pain with movement and with left leg extension.  Abdomen is soft and nontender, sensation intact, leg strength equal, reflexes symmetric.  Patient was given a Lidoderm patch, x-ray of the lumbar spine shows facet hypertrophy, discussed likely lumbar strain with underlying arthritis with patient.  Recommend she continue with Motrin, Tylenol, Lidoderm patch and warm compresses and follow-up with her Worker's Comp. provider.   [LM]    Clinical Course User Index [LM] Roque Lias   MDM Rules/Calculators/A&P                     Final Clinical Impression(s) / ED Diagnoses Final diagnoses:  Strain of lumbar region, initial encounter  Facet hypertrophy of lumbosacral region    Rx / DC Orders ED Discharge Orders         Ordered    lidocaine (LIDODERM) 5 %  Every 24 hours     08/19/19 0851           Tacy Learn, PA-C 08/19/19 [id number]    Sherwood Gambler, MD 08/19/19 [phone]

## 2019-08-19 NOTE — ED Triage Notes (Signed)
Pt here for evaluation of lower back pain. States last Thursday she lifted something at work and immediately had pain in her back. Has been taking ibuprofen and using topical cbd oil with temporary relief. Pt ambulatory.

## 2019-08-20 ENCOUNTER — Encounter: Payer: Self-pay | Admitting: Family Medicine

## 2019-08-24 ENCOUNTER — Ambulatory Visit: Payer: 59 | Admitting: Obstetrics and Gynecology

## 2019-09-28 ENCOUNTER — Encounter: Payer: Self-pay | Admitting: Obstetrics and Gynecology

## 2019-09-28 ENCOUNTER — Other Ambulatory Visit: Payer: Self-pay

## 2019-09-28 ENCOUNTER — Ambulatory Visit (INDEPENDENT_AMBULATORY_CARE_PROVIDER_SITE_OTHER): Payer: 59 | Admitting: Obstetrics and Gynecology

## 2019-09-28 VITALS — BP 136/105 | HR 71 | Wt 154.7 lb

## 2019-09-28 DIAGNOSIS — N760 Acute vaginitis: Secondary | ICD-10-CM

## 2019-09-28 DIAGNOSIS — B373 Candidiasis of vulva and vagina: Secondary | ICD-10-CM | POA: Diagnosis not present

## 2019-09-28 DIAGNOSIS — N898 Other specified noninflammatory disorders of vagina: Secondary | ICD-10-CM | POA: Diagnosis not present

## 2019-09-28 DIAGNOSIS — Z23 Encounter for immunization: Secondary | ICD-10-CM | POA: Diagnosis not present

## 2019-09-28 DIAGNOSIS — Z113 Encounter for screening for infections with a predominantly sexual mode of transmission: Secondary | ICD-10-CM | POA: Diagnosis not present

## 2019-09-28 DIAGNOSIS — N761 Subacute and chronic vaginitis: Secondary | ICD-10-CM | POA: Diagnosis not present

## 2019-09-28 DIAGNOSIS — B9689 Other specified bacterial agents as the cause of diseases classified elsewhere: Secondary | ICD-10-CM

## 2019-09-28 NOTE — Progress Notes (Signed)
49 yo s/p RATH 09/2018 here for recurrent vaginitis and desire for STI testing. Patient reports the presence of a vaginal odor without pruritis. She is uncertain if a discharge is present. She states these symptoms started following intercourse 3 weeks ago. Patient used a condom with her partner of 16 years. Patient is without any other complaints  Past Medical History:  Diagnosis Date  . Asthma   . Bartholin cyst   . Hernia, inguinal   . Hernia, inguinal   . Hypertension    Past Surgical History:  Procedure Laterality Date  . fibroid freezing     unsure  . IR GENERIC HISTORICAL  07/27/2014   IR RADIOLOGIST EVAL & MGMT 07/27/2014 Markus Daft, MD [facility] INTERV RAD  . ROBOTIC ASSISTED TOTAL HYSTERECTOMY N/A 09/15/2018   Procedure: XI ROBOTIC ASSISTED TOTAL HYSTERECTOMY WITH SALPINGECTOMY;  Surgeon: Lavonia Drafts, MD;  Location: [facility];  Service: Gynecology;  Laterality: N/A;   Family History  Problem Relation Age of Onset  . Thyroid disease Mother        large benign goiter   Social History   Tobacco Use  . Smoking status: Current Every Day Smoker    Packs/day: 1.00    Types: Cigarettes  . Smokeless tobacco: Never Used  Substance Use Topics  . Alcohol use: No  . Drug use: No   ROS See pertinent in HPI  Blood pressure (!) 136/105, pulse 71, weight 154 lb 11.2 oz (70.2 kg). GENERAL: Well-developed, well-nourished female in no acute distress.  NEURO: alert and oriented x3 Patient declined a pelvic exam  A/P 49 yo with vaginitis - self swab collected - STI screening per patient request - Patient will be contacted with abnormal results - RTC prn

## 2019-09-29 LAB — HEPATITIS B SURFACE ANTIGEN: Hepatitis B Surface Ag: NEGATIVE

## 2019-09-29 LAB — CERVICOVAGINAL ANCILLARY ONLY
Bacterial Vaginitis (gardnerella): POSITIVE — AB
Candida Glabrata: NEGATIVE
Candida Vaginitis: POSITIVE — AB
Chlamydia: NEGATIVE
Comment: NEGATIVE
Comment: NEGATIVE
Comment: NEGATIVE
Comment: NEGATIVE
Comment: NEGATIVE
Comment: NORMAL
Neisseria Gonorrhea: NEGATIVE
Trichomonas: POSITIVE — AB

## 2019-09-29 LAB — RPR: RPR Ser Ql: NONREACTIVE

## 2019-09-29 LAB — HIV ANTIBODY (ROUTINE TESTING W REFLEX): HIV Screen 4th Generation wRfx: NONREACTIVE

## 2019-09-29 LAB — HEPATITIS C ANTIBODY: Hep C Virus Ab: <0.1 s/co ratio (ref 0.0–0.9)

## 2019-09-30 MED ORDER — FLUCONAZOLE 150 MG PO TABS: 150.0000 mg | ORAL_TABLET | Freq: Once | ORAL | 0 refills | Status: AC

## 2019-09-30 MED ORDER — METRONIDAZOLE 500 MG PO TABS: 500.0000 mg | ORAL_TABLET | Freq: Two times a day (BID) | ORAL | 0 refills | Status: DC

## 2019-09-30 NOTE — Addendum Note (Signed)
Addended by: Mora Bellman on: 09/30/2019 11:11 AM   Modules accepted: Orders

## 2019-10-01 ENCOUNTER — Telehealth: Payer: Self-pay | Admitting: *Deleted

## 2019-10-01 NOTE — Telephone Encounter (Addendum)
-----   Message from Mora Bellman, MD sent at 09/30/2019 11:11 AM EDT ----- Please inform patient of recurrent BV, yeast and trichomonas infection. Rx has been e-prescribed. Her partner also needs to be informed and treated  3/19  1100 Called pt and left message stating that I am calling with test results information. She may call back or send a MyChart message so that we may deliver the information securely.

## 2019-10-04 NOTE — Telephone Encounter (Signed)
LM for pt that this is our second attempt in trying to reach you that a MyChart message will be sent.  MyChart message sent.  Called pt's [place] and was informed that pt picked up Flagyl prescription on 09/30/19.

## 2019-10-28 ENCOUNTER — Telehealth (INDEPENDENT_AMBULATORY_CARE_PROVIDER_SITE_OTHER): Payer: 59

## 2019-10-28 DIAGNOSIS — N760 Acute vaginitis: Secondary | ICD-10-CM

## 2019-10-28 DIAGNOSIS — N899 Noninflammatory disorder of vagina, unspecified: Secondary | ICD-10-CM

## 2019-10-28 NOTE — Telephone Encounter (Signed)
Pt left VM on nurse line stating she would like a call back to discuss vaginal infection. States she has completing the medication and is concerned the infection is still there.

## 2019-10-29 ENCOUNTER — Telehealth: Payer: Self-pay

## 2019-10-29 NOTE — Telephone Encounter (Signed)
Called pt to address Nurse VM issue of still having symptoms of infections, no answer. Left VM with hours of operation * to call back to schedule appt for TOC.

## 2019-11-04 ENCOUNTER — Ambulatory Visit: Payer: 59 | Attending: Internal Medicine

## 2019-11-04 DIAGNOSIS — Z23 Encounter for immunization: Secondary | ICD-10-CM

## 2019-11-04 NOTE — Progress Notes (Signed)
   Covid-19 Vaccination Clinic  Name:  Carol Mack    MRN: [id number] DOB: Jun 28, 1971  11/04/2019  Carol Mack was observed post Covid-19 immunization for 15 minutes without incident. She was provided with Vaccine Information Sheet and instruction to access the V-Safe system.   Carol Mack was instructed to call 911 with any severe reactions post vaccine: Marland Kitchen Difficulty breathing  . Swelling of face and throat  . A fast heartbeat  . A bad rash all over body  . Dizziness and weakness   Immunizations Administered    Name Date Dose VIS Date Route   Pfizer COVID-19 Vaccine 11/04/2019  4:51 PM 0.3 mL 09/08/2018 Intramuscular   Manufacturer: [employer], [employer]   Lot: [id number]   [place]: [id number]

## 2019-11-05 NOTE — Telephone Encounter (Signed)
Called pt. Pt states she has tried to call the office and has been able to schedule an appt. I explained that we can schedule an appt now. Nurse visit for self swab scheduled for 11/09/19 at 1520. Pt notified. Pt states she would prefer a vaginal cream instead of oral antibiotics if she tests positive for any infection.

## 2019-11-09 ENCOUNTER — Other Ambulatory Visit (HOSPITAL_COMMUNITY)
Admission: RE | Admit: 2019-11-09 | Discharge: 2019-11-09 | Disposition: A | Discharge status: Home / Self Care | Payer: 59 | Source: Ambulatory Visit | Attending: Family Medicine | Admitting: Family Medicine

## 2019-11-09 ENCOUNTER — Other Ambulatory Visit: Payer: Self-pay

## 2019-11-09 ENCOUNTER — Ambulatory Visit (INDEPENDENT_AMBULATORY_CARE_PROVIDER_SITE_OTHER): Payer: 59 | Admitting: *Deleted

## 2019-11-09 VITALS — BP 134/82 | HR 82 | Ht 64.0 in | Wt 151.8 lb

## 2019-11-09 DIAGNOSIS — N761 Subacute and chronic vaginitis: Secondary | ICD-10-CM

## 2019-11-09 NOTE — Progress Notes (Addendum)
Pt here for self swab TOC. She states she took the medication which was prescribed however is still having the same symptoms. Self swab obtained and sent to lab. Pt advised that she will be notified of results and treatment if necessary via [place].  Pt states she prefers Metrogel if treatment for BV is needed. She voiced understanding of all information given.    Chart reviewed for nurse visit. Agree with plan of care.   Virginia Rochester, NP 11/09/2019 4:55 PM

## 2019-11-11 ENCOUNTER — Other Ambulatory Visit: Payer: Self-pay | Admitting: Nurse Practitioner

## 2019-11-11 ENCOUNTER — Other Ambulatory Visit: Payer: Self-pay | Admitting: Obstetrics and Gynecology

## 2019-11-11 LAB — CERVICOVAGINAL ANCILLARY ONLY
Bacterial Vaginitis (gardnerella): POSITIVE — AB
Candida Glabrata: NEGATIVE
Candida Vaginitis: NEGATIVE
Chlamydia: NEGATIVE
Comment: NEGATIVE
Comment: NEGATIVE
Comment: NEGATIVE
Comment: NEGATIVE
Comment: NEGATIVE
Comment: NORMAL
Neisseria Gonorrhea: NEGATIVE
Trichomonas: POSITIVE — AB

## 2019-11-11 MED ORDER — METRONIDAZOLE 500 MG PO TABS: 500.0000 mg | ORAL_TABLET | Freq: Two times a day (BID) | ORAL | 0 refills | Status: DC

## 2019-11-12 ENCOUNTER — Other Ambulatory Visit: Payer: Self-pay

## 2019-11-12 MED ORDER — METRONIDAZOLE 0.75 % VA GEL: 1.0000 | Freq: Two times a day (BID) | VAGINAL | 0 refills | Status: DC

## 2019-11-15 ENCOUNTER — Telehealth: Payer: Self-pay

## 2019-11-15 NOTE — Telephone Encounter (Signed)
Called pt to se if she had been notified of test results, no answer. Will send pt My Chart message.

## 2019-11-16 NOTE — Telephone Encounter (Signed)
Called pt; VM left stating I am calling to follow up on results and a new medication. Asked pt to return call to our office or send a MyChart message.

## 2019-11-18 ENCOUNTER — Other Ambulatory Visit: Payer: Self-pay

## 2019-11-18 ENCOUNTER — Ambulatory Visit (HOSPITAL_COMMUNITY)
Admission: EM | Admit: 2019-11-18 | Discharge: 2019-11-18 | Disposition: A | Discharge status: Home / Self Care | Payer: 59 | Attending: Emergency Medicine | Admitting: Emergency Medicine

## 2019-11-18 ENCOUNTER — Encounter (HOSPITAL_COMMUNITY): Payer: Self-pay

## 2019-11-18 DIAGNOSIS — R0981 Nasal congestion: Secondary | ICD-10-CM | POA: Diagnosis not present

## 2019-11-18 DIAGNOSIS — R059 Cough, unspecified: Secondary | ICD-10-CM

## 2019-11-18 DIAGNOSIS — R05 Cough: Secondary | ICD-10-CM | POA: Diagnosis present

## 2019-11-18 DIAGNOSIS — Z20822 Contact with and (suspected) exposure to covid-19: Secondary | ICD-10-CM | POA: Diagnosis not present

## 2019-11-18 DIAGNOSIS — J302 Other seasonal allergic rhinitis: Secondary | ICD-10-CM | POA: Diagnosis not present

## 2019-11-18 DIAGNOSIS — J321 Chronic frontal sinusitis: Secondary | ICD-10-CM | POA: Insufficient documentation

## 2019-11-18 DIAGNOSIS — F17210 Nicotine dependence, cigarettes, uncomplicated: Secondary | ICD-10-CM | POA: Insufficient documentation

## 2019-11-18 MED ORDER — AZITHROMYCIN 250 MG PO TABS: 250.0000 mg | ORAL_TABLET | Freq: Every day | ORAL | 0 refills | Status: DC

## 2019-11-18 MED ORDER — PREDNISONE 10 MG (21) PO TBPK: ORAL_TABLET | Freq: Every day | ORAL | 0 refills | Status: DC

## 2019-11-18 NOTE — ED Triage Notes (Signed)
Pt c/o acute onset congestion, runny nose, non-productive cough, HA, sore throat since Monday.  Denies abdom pain, n/v/d, fever, chills, body aches.  Reports taking Mucinex Sinus, benadryl w/o improvement to symptoms.  Has had one covid vaccine, 2nd dose due on 05/17

## 2019-11-18 NOTE — Telephone Encounter (Signed)
Called patient and advised her to call the office for results. Sent patient a My Chart Message also.

## 2019-11-18 NOTE — ED Provider Notes (Signed)
[facility]    CSN: [id number] Arrival date & time: 11/18/19  1622      History   Chief Complaint Chief Complaint  Patient presents with  . Cough  . Nasal Congestion    HPI Carol Mack is a 49 y.o. female.   Pt here due to sinus pressure low grade fever, cough  After sleeping with a window open 4 days ago. States that she does have bad sinus issues. Has taken benadryl and mucinex with minimal relief. Intermit headaches none today.      Past Medical History:  Diagnosis Date  . Asthma   . Bartholin cyst   . Hernia, inguinal   . Hernia, inguinal   . Hypertension     Patient Active Problem List   Diagnosis Date Noted  . Abdominal pain 06/09/2019  . S/P laparoscopic hysterectomy 09/15/2018  . Left breast lump 08/07/2018  . Goiter 05/28/2018  . Essential hypertension 05/28/2018  . Cigarette nicotine dependence without complication 99991111  . Multinodular goiter 01/19/2016  . Hyperthyroidism 09/25/2015  . Fibroid, uterine   . Fibroids 06/02/2014  . Fibroid uterus 12/23/2013    Past Surgical History:  Procedure Laterality Date  . fibroid freezing     unsure  . IR GENERIC HISTORICAL  07/27/2014   IR RADIOLOGIST EVAL & MGMT 07/27/2014 Markus Daft, MD [facility] INTERV RAD  . ROBOTIC ASSISTED TOTAL HYSTERECTOMY N/A 09/15/2018   Procedure: XI ROBOTIC ASSISTED TOTAL HYSTERECTOMY WITH SALPINGECTOMY;  Surgeon: Lavonia Drafts, MD;  Location: [facility];  Service: Gynecology;  Laterality: N/A;    OB History    Gravida  2   Para  2   Term  2   Preterm      AB      Living  2     SAB      TAB      Ectopic      Multiple      Live Births  2            Home Medications    Prior to Admission medications   Medication Sig Start Date End Date Taking? Authorizing Provider  amLODipine (NORVASC) 5 MG tablet Take 1 tablet (5 mg total) by mouth daily. 01/18/19  Yes Billie Ruddy, MD  metroNIDAZOLE (METROGEL VAGINAL) 0.75 %  vaginal gel Place 1 Applicatorful vaginally 2 (two) times daily. 11/12/19  Yes Shelly Bombard, MD  albuterol (PROVENTIL HFA;VENTOLIN HFA) 108 (90 Base) MCG/ACT inhaler Inhale 2 puffs into the lungs every 6 (six) hours as needed for shortness of breath. 10/08/18   Lavonia Drafts, MD  azithromycin (ZITHROMAX) 250 MG tablet Take 1 tablet (250 mg total) by mouth daily. Take first 2 tablets together, then 1 every day until finished. 11/18/19   Marney Setting, NP  lidocaine (LIDODERM) 5 % Place 1 patch onto the skin daily. Remove & Discard patch within 12 hours or as directed by MD Patient not taking: Reported on 09/28/2019 08/19/19   Tacy Learn, PA-C  predniSONE (STERAPRED UNI-PAK 21 TAB) 10 MG (21) TBPK tablet Take by mouth daily. Take 6 tabs by mouth daily  for 2 days, then 5 tabs for 2 days, then 4 tabs for 2 days, then 3 tabs for 2 days, 2 tabs for 2 days, then 1 tab by mouth daily for 2 days 11/18/19   Marney Setting, NP    Family History Family History  Problem Relation Age of Onset  . Thyroid disease Mother  large benign goiter    Social History Social History   Tobacco Use  . Smoking status: Current Every Day Smoker    Packs/day: 1.00    Types: Cigarettes  . Smokeless tobacco: Never Used  Substance Use Topics  . Alcohol use: No  . Drug use: No     Allergies   Hydrocodone and Percocet [oxycodone-acetaminophen]   Review of Systems Review of Systems  Constitutional: Positive for chills.  HENT: Positive for congestion, ear pain, postnasal drip, rhinorrhea, sinus pressure and sinus pain.   Eyes: Negative.   Respiratory: Positive for cough.   Cardiovascular: Negative.   Gastrointestinal: Negative.   Genitourinary: Negative.   Neurological: Positive for headaches.       Intemrit not at this time      Physical Exam Triage Vital Signs ED Triage Vitals  Enc Vitals Group     BP 11/18/19 1636 118/87     Pulse Rate 11/18/19 1636 85     Resp 11/18/19  1636 18     Temp 11/18/19 1636 99 F (37.2 C)     Temp Source 11/18/19 1636 Oral     SpO2 11/18/19 1636 100 %     Weight --      Height --      Head Circumference --      Peak Flow --      Pain Score 11/18/19 1634 6     Pain Loc --      Pain Edu? --      Excl. in [place]? --    No data found.  Updated Vital Signs BP 118/87 (BP Location: Left Arm)   Pulse 85   Temp 99 F (37.2 C) (Oral)   Resp 18   LMP  (LMP Unknown)   SpO2 100%   Visual Acuity Right Eye Distance:   Left Eye Distance:   Bilateral Distance:    Right Eye Near:   Left Eye Near:    Bilateral Near:     Physical Exam Constitutional:      Appearance: She is ill-appearing.  HENT:     Right Ear: There is impacted cerumen.     Left Ear: There is impacted cerumen.     Nose: Congestion and rhinorrhea present.     Mouth/Throat:     Mouth: Mucous membranes are moist.  Eyes:     Pupils: Pupils are equal, round, and reactive to light.  Cardiovascular:     Rate and Rhythm: Normal rate.     Pulses: Normal pulses.  Pulmonary:     Effort: Pulmonary effort is normal.  Abdominal:     General: Abdomen is flat.  Musculoskeletal:     Cervical back: Normal range of motion.  Skin:    Capillary Refill: Capillary refill takes less than 2 seconds.  Neurological:     General: No focal deficit present.     Mental Status: She is alert.      UC Treatments / Results  Labs (all labs ordered are listed, but only abnormal results are displayed) Labs Reviewed  SARS CORONAVIRUS 2 (TAT 6-24 HRS)    EKG   Radiology No results found.  Procedures Procedures (including critical care time)  Medications Ordered in UC Medications - No data to display  Initial Impression / Assessment and Plan / UC Course  I have reviewed the triage vital signs and the nursing notes.  Pertinent labs & imaging results that were available during my care of the patient were reviewed by me  and considered in my medical decision making (see  chart for details).     Use humidifier at night to help  Push plenty of fluids  Take motrin as needed for pain  Take Claritin or zyrtec daily to help  Final Clinical Impressions(s) / UC Diagnoses   Final diagnoses:  Seasonal allergies  Chronic frontal sinusitis  Cough     Discharge Instructions     Use humidifier at night to help  Push plenty of fluids  Take motrin as needed for pain  Take Claritin or zyrtec daily to help    ED Prescriptions    Medication Sig Dispense Auth. Provider   predniSONE (STERAPRED UNI-PAK 21 TAB) 10 MG (21) TBPK tablet Take by mouth daily. Take 6 tabs by mouth daily  for 2 days, then 5 tabs for 2 days, then 4 tabs for 2 days, then 3 tabs for 2 days, 2 tabs for 2 days, then 1 tab by mouth daily for 2 days 42 tablet Morley Kos L, NP   azithromycin (ZITHROMAX) 250 MG tablet Take 1 tablet (250 mg total) by mouth daily. Take first 2 tablets together, then 1 every day until finished. 6 tablet Marney Setting, NP     PDMP not reviewed this encounter.   Marney Setting, NP 11/18/19 1710

## 2019-11-18 NOTE — Discharge Instructions (Addendum)
Use humidifier at night to help  Push plenty of fluids  Take motrin as needed for pain  Take Claritin or zyrtec daily to help

## 2019-11-19 LAB — SARS CORONAVIRUS 2 (TAT 6-24 HRS): SARS Coronavirus 2: NEGATIVE

## 2019-11-29 ENCOUNTER — Ambulatory Visit: Payer: 59

## 2020-01-11 ENCOUNTER — Other Ambulatory Visit: Payer: Self-pay

## 2020-01-14 ENCOUNTER — Other Ambulatory Visit: Payer: Self-pay

## 2020-01-18 MED ORDER — METRONIDAZOLE 0.75 % VA GEL: 1.0000 | Freq: Two times a day (BID) | VAGINAL | 0 refills | Status: DC

## 2020-02-14 ENCOUNTER — Other Ambulatory Visit: Payer: Self-pay | Admitting: Family Medicine

## 2020-02-14 DIAGNOSIS — I10 Essential (primary) hypertension: Secondary | ICD-10-CM

## 2020-02-15 NOTE — Telephone Encounter (Signed)
Pt needs appointment for further refills 

## 2020-02-24 ENCOUNTER — Other Ambulatory Visit: Payer: Self-pay | Admitting: Family Medicine

## 2020-02-24 DIAGNOSIS — I10 Essential (primary) hypertension: Secondary | ICD-10-CM

## 2020-04-11 ENCOUNTER — Other Ambulatory Visit: Payer: Self-pay

## 2020-04-12 ENCOUNTER — Other Ambulatory Visit: Payer: Self-pay

## 2020-04-14 ENCOUNTER — Ambulatory Visit (HOSPITAL_COMMUNITY)
Admission: EM | Admit: 2020-04-14 | Discharge: 2020-04-14 | Disposition: A | Discharge status: Home / Self Care | Payer: 59 | Attending: Family Medicine | Admitting: Family Medicine

## 2020-04-14 ENCOUNTER — Encounter (HOSPITAL_COMMUNITY): Payer: Self-pay | Admitting: *Deleted

## 2020-04-14 ENCOUNTER — Other Ambulatory Visit: Payer: Self-pay

## 2020-04-14 DIAGNOSIS — Z3202 Encounter for pregnancy test, result negative: Secondary | ICD-10-CM

## 2020-04-14 DIAGNOSIS — R350 Frequency of micturition: Secondary | ICD-10-CM

## 2020-04-14 DIAGNOSIS — J0110 Acute frontal sinusitis, unspecified: Secondary | ICD-10-CM

## 2020-04-14 DIAGNOSIS — S39012A Strain of muscle, fascia and tendon of lower back, initial encounter: Secondary | ICD-10-CM

## 2020-04-14 LAB — POCT URINALYSIS DIPSTICK, ED / UC
Glucose, UA: NEGATIVE mg/dL
Hgb urine dipstick: NEGATIVE
Ketones, ur: NEGATIVE mg/dL
Leukocytes,Ua: NEGATIVE
Nitrite: NEGATIVE
Protein, ur: NEGATIVE mg/dL
Specific Gravity, Urine: >=1.03 (ref 1.005–1.030)
Urobilinogen, UA: 0.2 mg/dL (ref 0.0–1.0)
pH: 5.5 (ref 5.0–8.0)

## 2020-04-14 LAB — POC URINE PREG, ED: Preg Test, Ur: NEGATIVE

## 2020-04-14 MED ORDER — IBUPROFEN 800 MG PO TABS: 800.0000 mg | ORAL_TABLET | Freq: Three times a day (TID) | ORAL | 0 refills | Status: DC

## 2020-04-14 MED ORDER — AMOXICILLIN-POT CLAVULANATE 875-125 MG PO TABS: 1.0000 | ORAL_TABLET | Freq: Two times a day (BID) | ORAL | 0 refills | Status: DC

## 2020-04-14 MED ORDER — CYCLOBENZAPRINE HCL 10 MG PO TABS: ORAL_TABLET | ORAL | 0 refills | Status: DC

## 2020-04-14 NOTE — ED Triage Notes (Addendum)
Patient in with complaints of sinus pressure and scratchy throat x 3 days. Patient denies any fever. Patient has taken Tylenol at home. Patient does not want to have COVID testing at this time. Patient also has complaints of lower abdominal pain, back pain and urinary frequency. Patient requesting to be tested for UTI.

## 2020-04-14 NOTE — ED Provider Notes (Signed)
Carol Mack   [id number] 04/14/20 Arrival Time: 6720  ASSESSMENT & PLAN:  1. Lumbar strain, initial encounter   2. Acute non-recurrent frontal sinusitis     Declines COVID testing.  Meds ordered this encounter  Medications  . amoxicillin-clavulanate (AUGMENTIN) 875-125 MG tablet    Sig: Take 1 tablet by mouth every 12 (twelve) hours.    Dispense:  20 tablet    Refill:  0  . cyclobenzaprine (FLEXERIL) 10 MG tablet    Sig: Take 1 tablet by mouth 3 times daily as needed for muscle spasm. Warning: May cause drowsiness.    Dispense:  21 tablet    Refill:  0  . ibuprofen (ADVIL) 800 MG tablet    Sig: Take 1 tablet (800 mg total) by mouth 3 (three) times daily with meals.    Dispense:  21 tablet    Refill:  0    Discussed typical duration of symptoms. OTC symptom care as needed. Ensure adequate fluid intake and rest. Encouraged mobility for back pain.   Follow-up Information    Billie Ruddy, MD.   Specialty: Family Medicine Why: As needed. Contact information: [address] [place] [address] [phone]               Reviewed expectations re: course of current medical issues. Questions answered. Outlined signs and symptoms indicating need for more acute intervention. Patient verbalized understanding. After Visit Summary given.   SUBJECTIVE: History from: patient.  Carol Mack is a 49 y.o. female who presents with complaint of nasal congestion, post-nasal drainage, and sinus pain. Onset gradual, over past week; stays congested usually. Respiratory symptoms: none. Fever: absent. Overall normal PO intake without n/v. OTC treatment: Tylenol without much relief. Seasonal allergies: yes. History of frequent sinus infections: reports "several times a year". No specific aggravating or alleviating factors reported.  Social History   Tobacco Use  Smoking Status Current Every Day Smoker  . Packs/day: 1.00  . Types: Cigarettes  Smokeless  Tobacco Never Used   Also reports bilateral LBP; feels she strained at work. Aching and sore. Worse with certain movements and prolonged standing. "Very stiff" after prolonged sitting. No extremity sensation changes or weakness. Requests to be tested for UTI. No specific dysuria or urinary frequency/urgency.    OBJECTIVE:  Vitals:   04/14/20 1205  BP: (!) 139/103  Pulse: 90  Resp: 16  Temp: 97.7 F (36.5 C)  TempSrc: Temporal  SpO2: 98%     General appearance: alert; no distress HEENT: nasal congestion; clear runny nose; throat irritation secondary to post-nasal drainage; bilateral frontal tenderness to palpation; turbinates boggy Neck: supple without LAD; trachea midline Lungs: unlabored respirations, symmetrical air entry; cough: absent; no respiratory distress Abd: soft Back: reports TTP over bilateral lumbar paraspinal musculature Skin: warm and dry Psychological: alert and cooperative; normal mood and affect  Allergies  Allergen Reactions  . Hydrocodone Itching  . Percocet [Oxycodone-Acetaminophen] Other (See Comments)    jittery    Past Medical History:  Diagnosis Date  . Asthma   . Bartholin cyst   . Hernia, inguinal   . Hernia, inguinal   . Hypertension    Family History  Problem Relation Age of Onset  . Thyroid disease Mother        large benign goiter   Social History   Socioeconomic History  . Marital status: Significant Other    Spouse name: Not on file  . Number of children: Not on file  . Years of  education: Not on file  . Highest education level: Not on file  Occupational History  . Not on file  Tobacco Use  . Smoking status: Current Every Day Smoker    Packs/day: 1.00    Types: Cigarettes  . Smokeless tobacco: Never Used  Vaping Use  . Vaping Use: Never used  Substance and Sexual Activity  . Alcohol use: No  . Drug use: No  . Sexual activity: Not on file  Other Topics Concern  . Not on file  Social History Narrative  . Not on  file   Social Determinants of Health   Financial Resource Strain:   . Difficulty of Paying Living Expenses: Not on file  Food Insecurity:   . Worried About Charity fundraiser in the Last Year: Not on file  . Ran Out of Food in the Last Year: Not on file  Transportation Needs:   . Lack of Transportation (Medical): Not on file  . Lack of Transportation (Non-Medical): Not on file  Physical Activity:   . Days of Exercise per Week: Not on file  . Minutes of Exercise per Session: Not on file  Stress:   . Feeling of Stress : Not on file  Social Connections:   . Frequency of Communication with Friends and Family: Not on file  . Frequency of Social Gatherings with Friends and Family: Not on file  . Attends Religious Services: Not on file  . Active Member of Clubs or Organizations: Not on file  . Attends Archivist Meetings: Not on file  . Marital Status: Not on file  Intimate Partner Violence:   . Fear of Current or Ex-Partner: Not on file  . Emotionally Abused: Not on file  . Physically Abused: Not on file  . Sexually Abused: Not on file            Vanessa Kick, MD 04/14/20 [phone]

## 2020-09-27 ENCOUNTER — Emergency Department (HOSPITAL_COMMUNITY)
Admission: EM | Admit: 2020-09-27 | Discharge: 2020-09-27 | Disposition: A | Discharge status: Home / Self Care | Payer: Self-pay | Attending: Emergency Medicine | Admitting: Emergency Medicine

## 2020-09-27 ENCOUNTER — Emergency Department (HOSPITAL_COMMUNITY): Payer: Self-pay

## 2020-09-27 ENCOUNTER — Other Ambulatory Visit: Payer: Self-pay

## 2020-09-27 ENCOUNTER — Encounter (HOSPITAL_COMMUNITY): Payer: Self-pay | Admitting: Emergency Medicine

## 2020-09-27 DIAGNOSIS — I10 Essential (primary) hypertension: Secondary | ICD-10-CM | POA: Insufficient documentation

## 2020-09-27 DIAGNOSIS — R112 Nausea with vomiting, unspecified: Secondary | ICD-10-CM | POA: Insufficient documentation

## 2020-09-27 DIAGNOSIS — G935 Compression of brain: Secondary | ICD-10-CM

## 2020-09-27 DIAGNOSIS — Z760 Encounter for issue of repeat prescription: Secondary | ICD-10-CM | POA: Insufficient documentation

## 2020-09-27 DIAGNOSIS — Q0700 Arnold-Chiari syndrome without spina bifida or hydrocephalus: Secondary | ICD-10-CM | POA: Insufficient documentation

## 2020-09-27 DIAGNOSIS — J45909 Unspecified asthma, uncomplicated: Secondary | ICD-10-CM | POA: Insufficient documentation

## 2020-09-27 DIAGNOSIS — F17210 Nicotine dependence, cigarettes, uncomplicated: Secondary | ICD-10-CM | POA: Insufficient documentation

## 2020-09-27 DIAGNOSIS — Z79899 Other long term (current) drug therapy: Secondary | ICD-10-CM | POA: Insufficient documentation

## 2020-09-27 LAB — URINALYSIS, ROUTINE W REFLEX MICROSCOPIC
Bilirubin Urine: NEGATIVE
Glucose, UA: NEGATIVE mg/dL
Hgb urine dipstick: NEGATIVE
Ketones, ur: 20 mg/dL — AB
Leukocytes,Ua: NEGATIVE
Nitrite: NEGATIVE
Protein, ur: NEGATIVE mg/dL
Specific Gravity, Urine: 1.012 (ref 1.005–1.030)
pH: 7 (ref 5.0–8.0)

## 2020-09-27 LAB — CBC WITH DIFFERENTIAL/PLATELET
Abs Immature Granulocytes: 0.05 10*3/uL (ref 0.00–0.07)
Basophils Absolute: 0 10*3/uL (ref 0.0–0.1)
Basophils Relative: 1 %
Eosinophils Absolute: 0 10*3/uL (ref 0.0–0.5)
Eosinophils Relative: 0 %
HCT: 43.2 % (ref 36.0–46.0)
Hemoglobin: 15.1 g/dL — ABNORMAL HIGH (ref 12.0–15.0)
Immature Granulocytes: 1 %
Lymphocytes Relative: 34 %
Lymphs Abs: 2.1 10*3/uL (ref 0.7–4.0)
MCH: 25.3 pg — ABNORMAL LOW (ref 26.0–34.0)
MCHC: 35 g/dL (ref 30.0–36.0)
MCV: 72.2 fL — ABNORMAL LOW (ref 80.0–100.0)
Monocytes Absolute: 0.4 10*3/uL (ref 0.1–1.0)
Monocytes Relative: 6 %
Neutro Abs: 3.6 10*3/uL (ref 1.7–7.7)
Neutrophils Relative %: 58 %
Platelets: 279 10*3/uL (ref 150–400)
RBC: 5.98 MIL/uL — ABNORMAL HIGH (ref 3.87–5.11)
RDW: 14 % (ref 11.5–15.5)
WBC: 6.2 10*3/uL (ref 4.0–10.5)
nRBC: 0 % (ref 0.0–0.2)

## 2020-09-27 LAB — BASIC METABOLIC PANEL
Anion gap: 7 (ref 5–15)
BUN: 8 mg/dL (ref 6–20)
CO2: 24 mmol/L (ref 22–32)
Calcium: 9.1 mg/dL (ref 8.9–10.3)
Chloride: 106 mmol/L (ref 98–111)
Creatinine, Ser: 0.71 mg/dL (ref 0.44–1.00)
GFR, Estimated: >60 mL/min (ref >60)
Glucose, Bld: 113 mg/dL — ABNORMAL HIGH (ref 70–99)
Potassium: 4 mmol/L (ref 3.5–5.1)
Sodium: 137 mmol/L (ref 135–145)

## 2020-09-27 MED ORDER — AMLODIPINE BESYLATE 5 MG PO TABS
5.0000 mg | ORAL_TABLET | Freq: Once | ORAL | Status: AC
  Administered 2020-09-27: 5 mg via ORAL
  Filled 2020-09-27: qty 1

## 2020-09-27 MED ORDER — ONDANSETRON 4 MG PO TBDP
4.0000 mg | ORAL_TABLET | Freq: Once | ORAL | Status: AC
  Administered 2020-09-27: 4 mg via ORAL
  Filled 2020-09-27: qty 1

## 2020-09-27 MED ORDER — SODIUM CHLORIDE 0.9 % IV BOLUS
1000.0000 mL | Freq: Once | INTRAVENOUS | Status: AC
  Administered 2020-09-27: 1000 mL via INTRAVENOUS

## 2020-09-27 MED ORDER — KETOROLAC TROMETHAMINE 30 MG/ML IJ SOLN
30.0000 mg | Freq: Once | INTRAMUSCULAR | Status: AC
  Administered 2020-09-27: 30 mg via INTRAVENOUS
  Filled 2020-09-27: qty 1

## 2020-09-27 MED ORDER — DIAZEPAM 5 MG/ML IJ SOLN
5.0000 mg | Freq: Once | INTRAMUSCULAR | Status: AC
  Administered 2020-09-27: 5 mg via INTRAVENOUS
  Filled 2020-09-27: qty 2

## 2020-09-27 MED ORDER — DEXAMETHASONE SODIUM PHOSPHATE 10 MG/ML IJ SOLN
10.0000 mg | Freq: Once | INTRAMUSCULAR | Status: AC
  Administered 2020-09-27: 10 mg via INTRAVENOUS
  Filled 2020-09-27: qty 1

## 2020-09-27 MED ORDER — AMLODIPINE BESYLATE 5 MG PO TABS: 5.0000 mg | ORAL_TABLET | Freq: Every day | ORAL | 0 refills | Status: DC

## 2020-09-27 MED ORDER — ALBUTEROL SULFATE HFA 108 (90 BASE) MCG/ACT IN AERS
1.0000 | INHALATION_SPRAY | Freq: Four times a day (QID) | RESPIRATORY_TRACT | 1 refills | Status: DC | PRN
Start: 2020-09-27 — End: 2022-12-30

## 2020-09-27 NOTE — ED Notes (Signed)
Pt reports claustrophobia. MRI aware.

## 2020-09-27 NOTE — ED Notes (Signed)
Patient Alert and oriented to baseline. Stable and ambulatory to baseline. Patient verbalized understanding of the discharge instructions.  Patient belongings were taken by the patient.   

## 2020-09-27 NOTE — Discharge Instructions (Signed)
Referral given to [facility], call to schedule appointment. Referral to neurosurgery, call to schedule appointment to discuss your findings today. Refill of your amlodipine and inhaler sent to your pharmacy. Return to the emergency room for worsening or concerning symptoms.

## 2020-09-27 NOTE — ED Triage Notes (Addendum)
Patient complains of vomiting and sinus congestion since yesterday, states she started vomiting last night. Patient alert, oriented, and in no apparent distress at this time.  BP 173/110 in triage. History of hypertension, patient states she forgets to take her antihypertensive sometimes and did not take it today.

## 2020-09-27 NOTE — ED Provider Notes (Signed)
[facility] EMERGENCY DEPARTMENT Provider Note   CSN: [id number] Arrival date & time: 09/27/20  6761     History Chief Complaint  Patient presents with  . Facial Pain    Carol Mack is a 50 y.o. female.  50 year old female with history of hypertension presents with complaint of headache with nausea and vomiting.  Pain is located across her forehead and maxillary sinus areas as well as across the back of her head, reported to be sudden onset while at work yesterday.  Pain has been constant, not worse with lights or sounds, no history of similar headaches previously although states that she does feel dizzy at times when her blood pressure is high.  Patient did not take her blood pressure medication yesterday or today, states she only has a few pills left before she runs out and has recently lost her insurance.  Reports nausea and vomiting onset yesterday after eating [place] food, emesis described as food contents, nonbilious nonbloody, too numerous to count.  Patient states that somebody else in her household did eat the same [place] and did not get sick.  Denies abdominal pain or cramping, changes in bowel or bladder habits.  Denies changes in vision, speech, gait, unilateral weakness or numbness.  Denies recent cough, congestion, URI symptoms.  No other complaints or concerns today.        Past Medical History:  Diagnosis Date  . Asthma   . Bartholin cyst   . Hernia, inguinal   . Hernia, inguinal   . Hypertension     Patient Active Problem List   Diagnosis Date Noted  . Abdominal pain 06/09/2019  . S/P laparoscopic hysterectomy 09/15/2018  . Left breast lump 08/07/2018  . Goiter 05/28/2018  . Essential hypertension 05/28/2018  . Cigarette nicotine dependence without complication 95/03/3266  . Multinodular goiter 01/19/2016  . Hyperthyroidism 09/25/2015  . Fibroid, uterine   . Fibroids 06/02/2014  . Fibroid uterus 12/23/2013    Past Surgical  History:  Procedure Laterality Date  . fibroid freezing     unsure  . IR GENERIC HISTORICAL  07/27/2014   IR RADIOLOGIST EVAL & MGMT 07/27/2014 Markus Daft, MD [facility] INTERV RAD  . ROBOTIC ASSISTED TOTAL HYSTERECTOMY N/A 09/15/2018   Procedure: XI ROBOTIC ASSISTED TOTAL HYSTERECTOMY WITH SALPINGECTOMY;  Surgeon: Lavonia Drafts, MD;  Location: [place];  Service: Gynecology;  Laterality: N/A;     OB History    Gravida  2   Para  2   Term  2   Preterm      AB      Living  2     SAB      IAB      Ectopic      Multiple      Live Births  2           Family History  Problem Relation Age of Onset  . Thyroid disease Mother        large benign goiter    Social History   Tobacco Use  . Smoking status: Current Every Day Smoker    Packs/day: 1.00    Types: Cigarettes  . Smokeless tobacco: Never Used  Vaping Use  . Vaping Use: Never used  Substance Use Topics  . Alcohol use: No  . Drug use: No    Home Medications Prior to Admission medications   Medication Sig Start Date End Date Taking? Authorizing Provider  albuterol (VENTOLIN HFA) 108 (90 Base) MCG/ACT inhaler  Inhale 1-2 puffs into the lungs every 6 (six) hours as needed for shortness of breath. 09/27/20   Tacy Learn, PA-C  amLODipine (NORVASC) 5 MG tablet Take 1 tablet (5 mg total) by mouth daily. 09/27/20   Tacy Learn, PA-C  cyclobenzaprine (FLEXERIL) 10 MG tablet Take 1 tablet by mouth 3 times daily as needed for muscle spasm. Warning: May cause drowsiness. Patient not taking: Reported on 09/27/2020 04/14/20   Vanessa Kick, MD  ibuprofen (ADVIL) 800 MG tablet Take 1 tablet (800 mg total) by mouth 3 (three) times daily with meals. Patient not taking: Reported on 09/27/2020 04/14/20   Vanessa Kick, MD    Allergies    Hydrocodone and Percocet [oxycodone-acetaminophen]  Review of Systems   Review of Systems  Constitutional: Negative for chills and fever.  HENT: Positive for  facial swelling, sinus pressure and sinus pain. Negative for congestion, dental problem, sore throat and trouble swallowing.   Eyes: Negative for visual disturbance.  Respiratory: Negative for cough and shortness of breath.   Cardiovascular: Negative for chest pain.  Gastrointestinal: Positive for nausea and vomiting. Negative for abdominal pain, constipation and diarrhea.  Genitourinary: Negative for dysuria and frequency.  Musculoskeletal: Negative for neck pain and neck stiffness.  Skin: Negative for rash and wound.  Allergic/Immunologic: Negative for immunocompromised state.  Neurological: Positive for headaches. Negative for speech difficulty and weakness.  Hematological: Does not bruise/bleed easily.  Psychiatric/Behavioral: Negative for confusion.  All other systems reviewed and are negative.   Physical Exam Updated Vital Signs BP (!) 161/106   Pulse 78   Temp 98.5 F (36.9 C) (Oral)   Resp (!) 23   LMP  (LMP Unknown)   SpO2 95%   Physical Exam Vitals and nursing note reviewed.  Constitutional:      General: She is not in acute distress.    Appearance: She is well-developed. She is not diaphoretic.  HENT:     Head: Normocephalic and atraumatic.     Nose: Nose normal.     Mouth/Throat:     Mouth: Mucous membranes are moist.     Pharynx: No oropharyngeal exudate or posterior oropharyngeal erythema.  Eyes:     Extraocular Movements: Extraocular movements intact.     Pupils: Pupils are equal, round, and reactive to light.  Cardiovascular:     Rate and Rhythm: Normal rate and regular rhythm.     Pulses: Normal pulses.     Heart sounds: Normal heart sounds.  Pulmonary:     Effort: Pulmonary effort is normal.     Breath sounds: Normal breath sounds.  Abdominal:     Palpations: Abdomen is soft.     Tenderness: There is no abdominal tenderness.  Musculoskeletal:     Cervical back: Neck supple.     Right lower leg: No edema.     Left lower leg: No edema.  Skin:     General: Skin is warm and dry.     Findings: No erythema or rash.  Neurological:     General: No focal deficit present.     Mental Status: She is alert and oriented to person, place, and time.     Cranial Nerves: No cranial nerve deficit.     Sensory: No sensory deficit.     Motor: No weakness.  Psychiatric:        Behavior: Behavior normal.     ED Results / Procedures / Treatments   Labs (all labs ordered are listed, but only abnormal results  are displayed) Labs Reviewed  BASIC METABOLIC PANEL - Abnormal; Notable for the following components:      Result Value   Glucose, Bld 113 (*)    All other components within normal limits  CBC WITH DIFFERENTIAL/PLATELET - Abnormal; Notable for the following components:   RBC 5.98 (*)    Hemoglobin 15.1 (*)    MCV 72.2 (*)    MCH 25.3 (*)    All other components within normal limits  URINALYSIS, ROUTINE W REFLEX MICROSCOPIC - Abnormal; Notable for the following components:   Ketones, ur 20 (*)    All other components within normal limits    EKG EKG Interpretation  Date/Time:  Wednesday September 27 2020 09:29:55 EDT Ventricular Rate:  64 PR Interval:    QRS Duration: 87 QT Interval:  452 QTC Calculation: 467 R Axis:   143 Text Interpretation: Sinus rhythm Probable anterior infarct, age indeterminate No significant change since last tracing Confirmed by Blanchie Dessert [phone]) on 09/27/2020 9:32:36 AM   Radiology CT Head Wo Contrast  Result Date: 09/27/2020 CLINICAL DATA:  Headache, new or worsening. Hypertension and vomiting. EXAM: CT HEAD WITHOUT CONTRAST TECHNIQUE: Contiguous axial images were obtained from the base of the skull through the vertex without intravenous contrast. FINDINGS: Brain: Soft tissue fullness at the foramen magnum with low cerebellar tonsils. High-density in the low posterior fossa is likely related to calcified choroid plexus given the shape and densitometry, rather than hemorrhage. No hydrocephalus or  visible infarct. No evidence of mass lesion. Vascular: No hyperdense vessel or unexpected calcification. Skull: Normal. Negative for fracture or focal lesion. Sinuses/Orbits: No acute finding. IMPRESSION: 1. Fullness in the foramen magnum with low and flattened cerebellar tonsils suggesting Chiari 1 malformation. Recommend brain MRI in this setting. 2. High-density at the low fourth ventricle with densitometry more consistent with calcification than hemorrhage, attention on brain MRI. Electronically Signed   By: Monte Fantasia M.D.   On: 09/27/2020 10:32   MR BRAIN WO CONTRAST  Result Date: 09/27/2020 CLINICAL DATA:  Headache, vomiting, and sinus congestion. History of hypertension. EXAM: MRI HEAD WITHOUT CONTRAST TECHNIQUE: Multiplanar, multiecho pulse sequences of the brain and surrounding structures were obtained without intravenous contrast. COMPARISON:  Head CT 09/27/2020 FINDINGS: Brain: The cerebellar tonsils extend approximately 1.4 cm below the foramen magnum and are pointed with partial effacement of the CSF spaces at the foramen magnum. The ventricles and sulci are normal. There is no evidence of an acute infarct, intracranial hemorrhage, mass, midline shift, or extra-axial fluid collection. Scattered small foci of T2 hyperintensity are present in the subcortical and deep cerebral white matter bilaterally and are mildly to moderately advanced for age. The sella is unremarkable. Vascular: Major intracranial vascular flow voids are preserved. Skull and upper cervical spine: Unremarkable bone marrow signal. Sinuses/Orbits: Unremarkable orbits. Minimal mucosal thickening in the mastoid air cells bilaterally. Clear paranasal sinuses. Other: None. IMPRESSION: 1. No acute intracranial abnormality. 2. Chiari I malformation. 3. Mildly to moderately age advanced cerebral white matter T2 signal changes, nonspecific though most often seen with chronic small vessel ischemia. Other considerations include  migraines, prior infection/inflammation, and vasculitis. Electronically Signed   By: Logan Bores M.D.   On: 09/27/2020 14:52    Procedures Procedures   Medications Ordered in ED Medications  amLODipine (NORVASC) tablet 5 mg (5 mg Oral Given 09/27/20 0941)  ondansetron (ZOFRAN-ODT) disintegrating tablet 4 mg (4 mg Oral Given 09/27/20 0942)  sodium chloride 0.9 % bolus 1,000 mL (0 mLs Intravenous Stopped 09/27/20  1202)  diazepam (VALIUM) injection 5 mg (5 mg Intravenous Given 09/27/20 1202)  ketorolac (TORADOL) 30 MG/ML injection 30 mg (30 mg Intravenous Given 09/27/20 1527)  dexamethasone (DECADRON) injection 10 mg (10 mg Intravenous Given 09/27/20 1527)    ED Course  I have reviewed the triage vital signs and the nursing notes.  Pertinent labs & imaging results that were available during my care of the patient were reviewed by me and considered in my medical decision making (see chart for details).  Clinical Course as of 09/27/20 1618  Wed Sep 28, 2879  4034 50 year old female with complaint of headache with vomiting as above.  On exam, patient appears to feel unwell, no sinus tenderness, neuro exam is unremarkable.  Vitals reviewed, patient is hypertensive, has not taken her blood pressure medication. Patient was given her regular blood pressure medication and IV fluids, labs assessed, no significant findings on review of CBC, BMP and urinalysis. CT head concerning for Chiari malformation, recommends MRI. Patient was sent for MRI, shows Chiari I malformation, is referred to neurosurgery for follow-up. Patient continues to have a headache, she was given Toradol and Decadron.  Given referral to [facility] and advised return to ED for worsening or concerning symptoms.  Referral placed to transitions of care as patient has recently lost her insurance and will need assistance following up. [LM]    Clinical Course User Index [LM] Roque Lias   MDM  Rules/Calculators/A&P                          Final Clinical Impression(s) / ED Diagnoses Final diagnoses:  Chiari malformation type I ([place])  Hypertension, unspecified type  Medication refill    Rx / DC Orders ED Discharge Orders         Ordered    albuterol (VENTOLIN HFA) 108 (90 Base) MCG/ACT inhaler  Every 6 hours PRN        09/27/20 1506    amLODipine (NORVASC) 5 MG tablet  Daily        09/27/20 1506           Roque Lias 09/27/20 1618    Blanchie Dessert, MD 09/30/20 2334

## 2020-10-18 IMAGING — CT CT ABD-PELV W/ CM
2 of 5 series · 16 of 46 positions shown, 18 images · IV contrast (APPLIED)
Comparison: CT 09/17/2011

CLINICAL DATA: Right lower quadrant pain since yesterday. Nausea.

EXAM:
CT ABDOMEN AND PELVIS WITH CONTRAST
TECHNIQUE: Multidetector CT imaging of the abdomen and pelvis was performed
using the standard protocol following bolus administration of
intravenous contrast.
CONTRAST:  100mL OMNIPAQUE IOHEXOL 300 MG/ML  SOLN

[Series 3: abdomen 5.0 · axial · 0.78mm/px · z∈[-362,+12]mm · 13 of 86 slices shown, 15 images]
[im 6/86  soft-tissue]
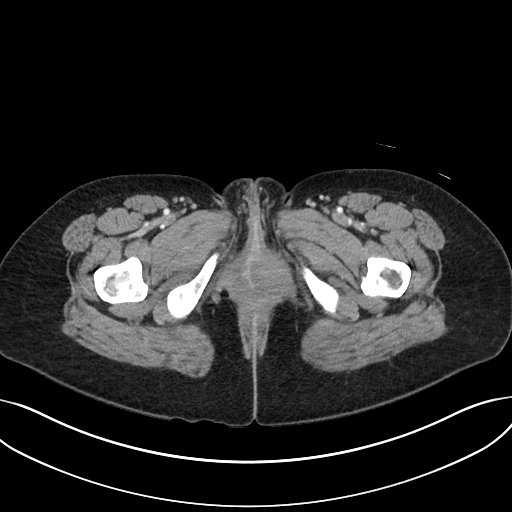
[im 6/86  bone]
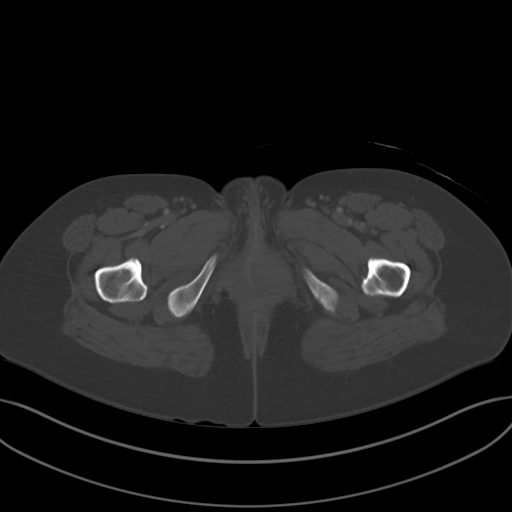
[im 11/86  soft-tissue]
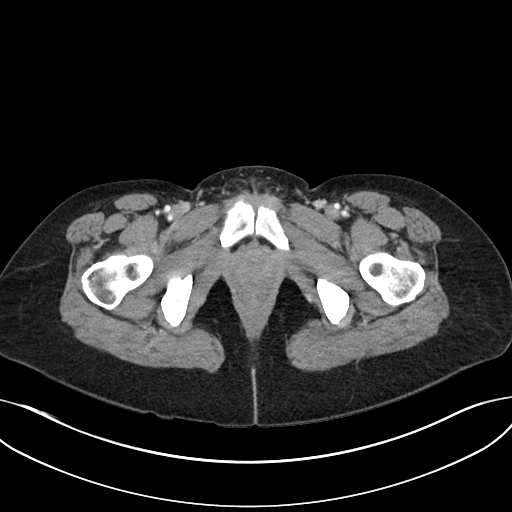
[im 21/86  soft-tissue]
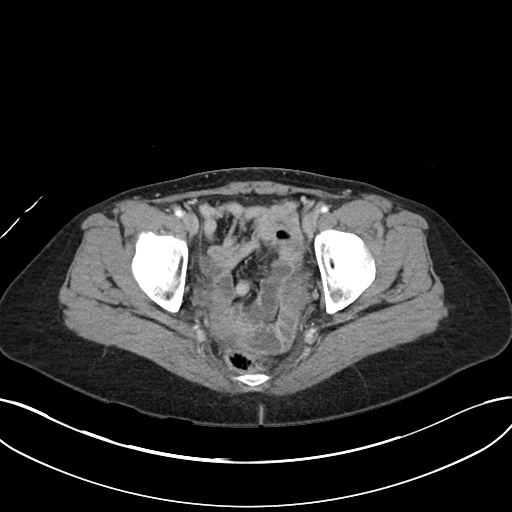
[im 26/86  soft-tissue]
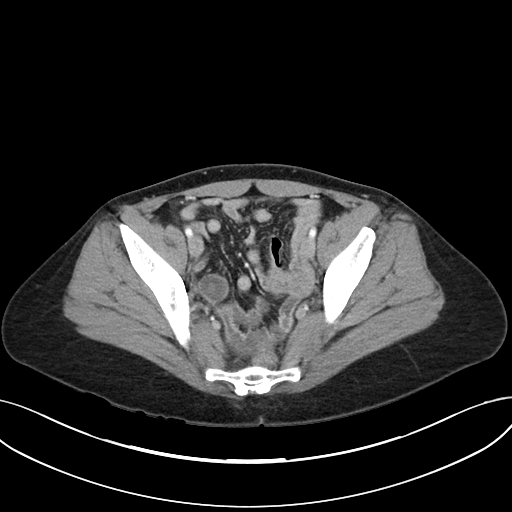
[im 31/86  soft-tissue]
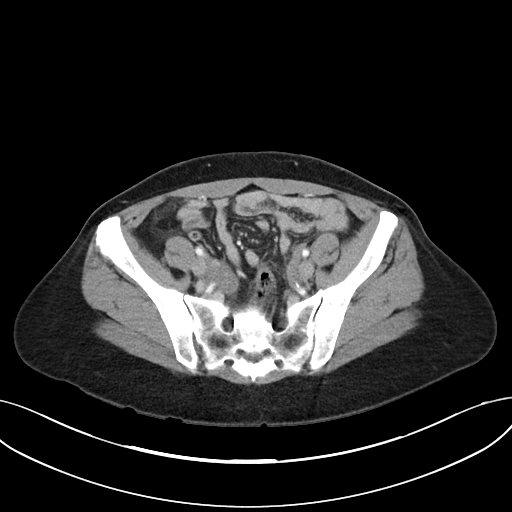
[im 36/86  soft-tissue]
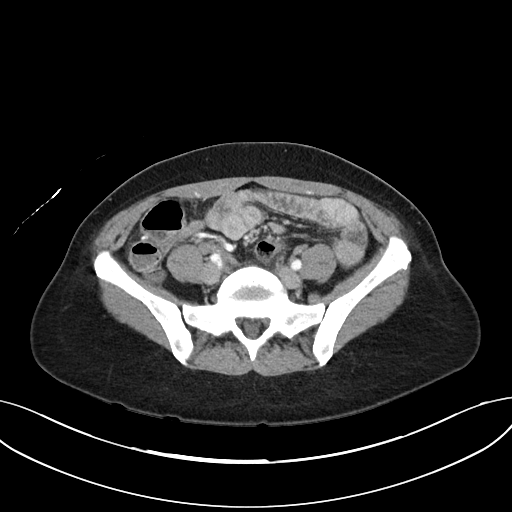
[im 46/86  soft-tissue]
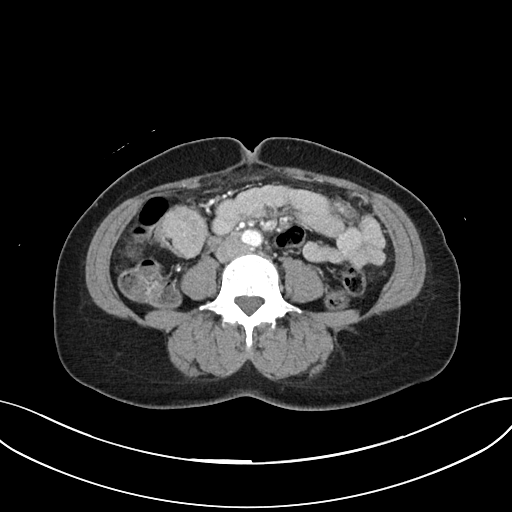
[im 51/86  soft-tissue]
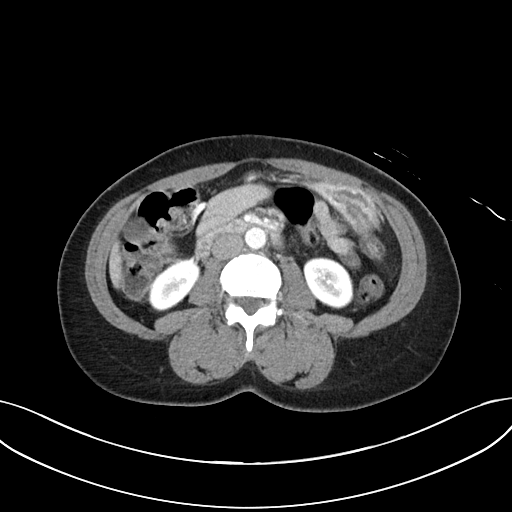
[im 56/86  soft-tissue]
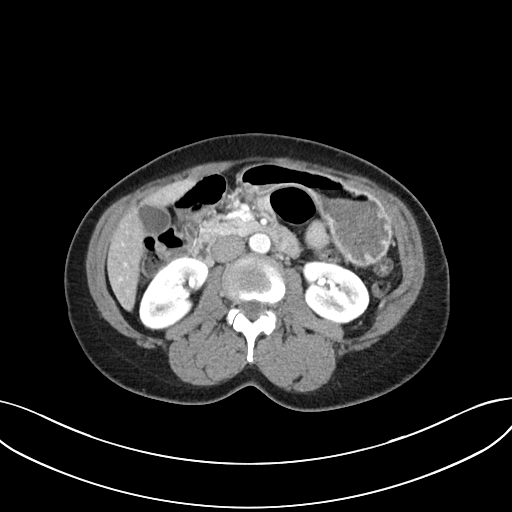
[im 56/86  bone]
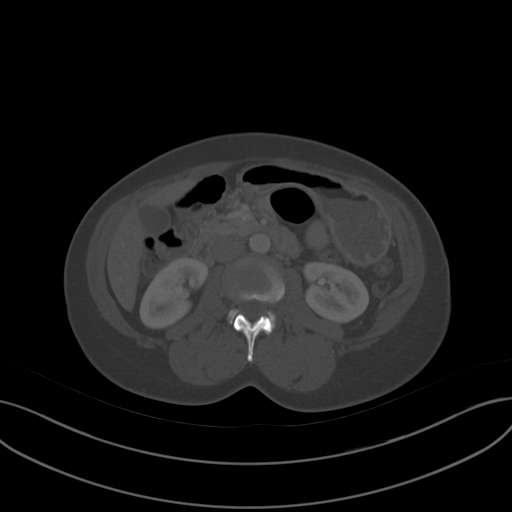
[im 61/86  soft-tissue]
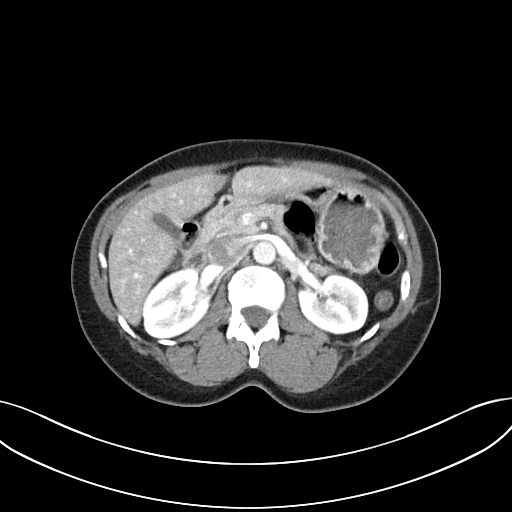
[im 66/86  soft-tissue]
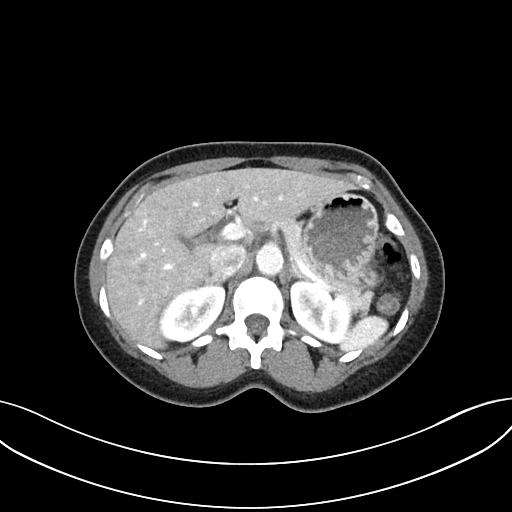
[im 76/86  soft-tissue]
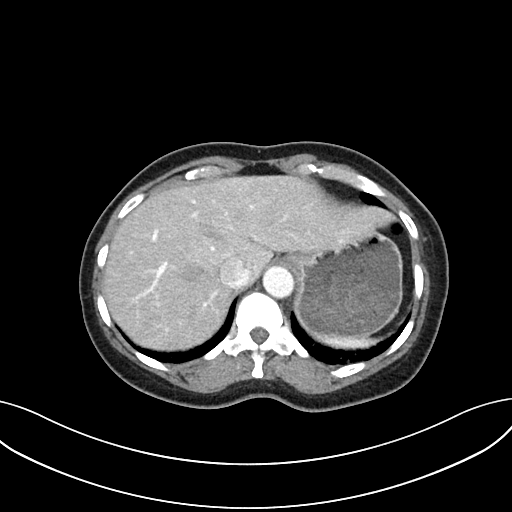
[im 81/86  soft-tissue]
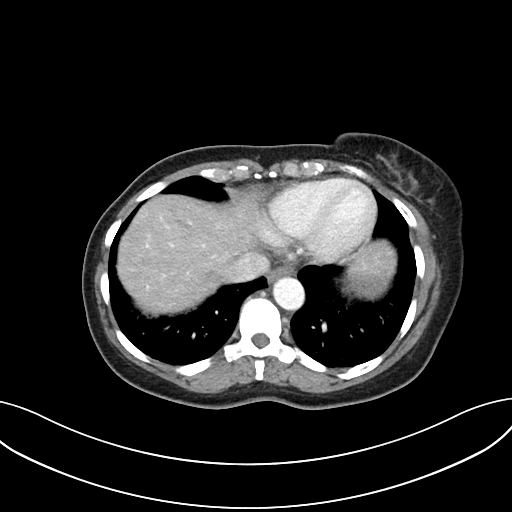

[Series 6: abdomen 3.0 mpr cor · coronal · 0.71mm/px · 3 of 79 slices shown]
[im 27/79  soft-tissue]
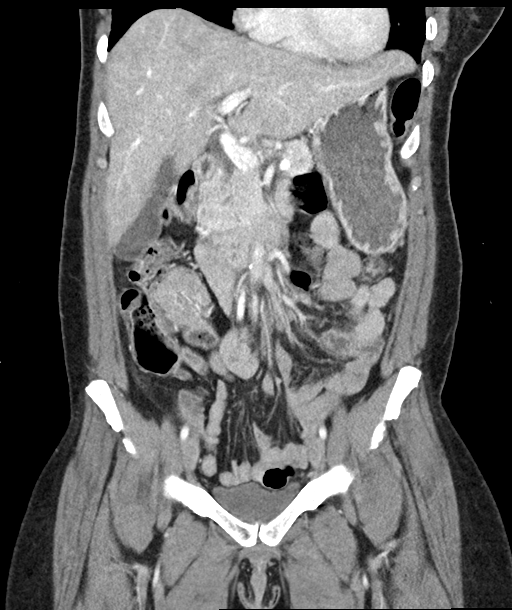
[im 35/79  soft-tissue]
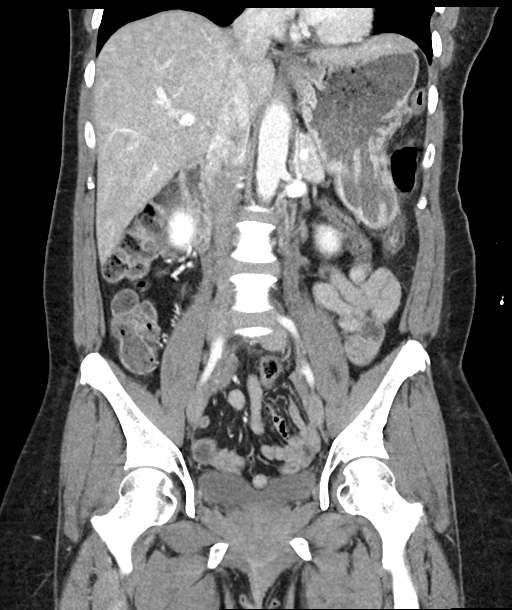
[im 44/79  soft-tissue]
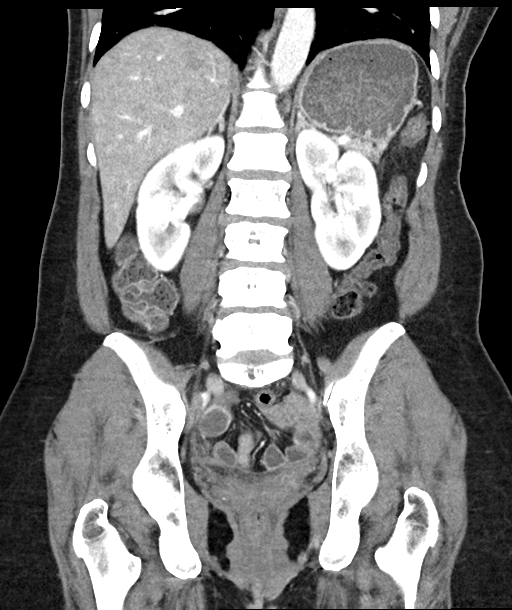

[16 of 46 positions shown; findings below may reference images not displayed]

FINDINGS: Lower chest: Cystic lung disease/emphysema at the bases. No pleural
fluid.

Hepatobiliary: No focal liver abnormality is seen. No gallstones,
gallbladder wall thickening, or biliary dilatation.

Pancreas: No ductal dilatation or inflammation.

Spleen: Normal in size without focal abnormality.

Adrenals/Urinary Tract: Normal adrenal glands. No hydronephrosis or
perinephric edema. Homogeneous renal enhancement with symmetric
excretion on delayed phase imaging. Urinary bladder is partially
distended without wall thickening.

Stomach/Bowel: No gastric wall thickening. Fluid/ingested material
in the stomach. No obstruction. Pelvic bowel loops are fluid-filled
with question of mild hyperemia. Appendix is mildly dilated at 8 mm.
Question of mild appendiceal wall hyperemia. Faint fat stranding
about the appendiceal tip in the right pelvis, series 3, image 64.
Small volume of stool in the proximal colon. More distal colon is
nondistended. Sigmoid colon is tortuous. No colonic wall thickening.

Vascular/Lymphatic: Mild aortic atherosclerosis. No aneurysm. The
portal vein is patent. No adenopathy.

Reproductive: Post hysterectomy. Peripherally enhancing 2.3 cm cyst
in the right ovary. Left ovary is not well-defined. No suspicious
adnexal mass.

Other: Minimal fat stranding in the right lower quadrant and pelvis.
No free air, free fluid, or focal fluid collection. Tiny fat
containing supraumbilical ventral abdominal wall hernia.

Musculoskeletal: Prominent Schmorl's nodes in superior endplate of
L5 and S1, unchanged from prior. There are no acute or suspicious
osseous abnormalities.
IMPRESSION: 1. Findings suspicious but not definitive for early acute
appendicitis. The appendix is mildly dilated with questionable wall
hyperemia and faint fat stranding about the appendiceal tip. However
there is also hyperemia of pelvic small bowel loops, and appendiceal
findings could be related to generalized enteritis. In the setting
of right lower quadrant pain, early or tip appendicitis is favored.
2. Peripherally enhancing 2.3 cm right ovarian cyst, likely corpus
luteum.

Aortic Atherosclerosis ([id number]).

## 2020-12-19 ENCOUNTER — Encounter: Payer: Self-pay | Admitting: *Deleted

## 2020-12-22 ENCOUNTER — Encounter: Payer: Self-pay | Admitting: Diagnostic Neuroimaging

## 2020-12-22 ENCOUNTER — Ambulatory Visit: Payer: Self-pay | Admitting: Diagnostic Neuroimaging

## 2020-12-29 ENCOUNTER — Encounter (HOSPITAL_COMMUNITY): Payer: Self-pay | Admitting: Emergency Medicine

## 2020-12-29 ENCOUNTER — Emergency Department (HOSPITAL_COMMUNITY)
Admission: EM | Admit: 2020-12-29 | Discharge: 2020-12-29 | Disposition: A | Discharge status: Home / Self Care | Payer: Self-pay | Attending: Emergency Medicine | Admitting: Emergency Medicine

## 2020-12-29 DIAGNOSIS — F17210 Nicotine dependence, cigarettes, uncomplicated: Secondary | ICD-10-CM | POA: Insufficient documentation

## 2020-12-29 DIAGNOSIS — R519 Headache, unspecified: Secondary | ICD-10-CM

## 2020-12-29 DIAGNOSIS — I10 Essential (primary) hypertension: Secondary | ICD-10-CM | POA: Insufficient documentation

## 2020-12-29 DIAGNOSIS — J45909 Unspecified asthma, uncomplicated: Secondary | ICD-10-CM | POA: Insufficient documentation

## 2020-12-29 LAB — CBC WITH DIFFERENTIAL/PLATELET
Abs Immature Granulocytes: 0.01 10*3/uL (ref 0.00–0.07)
Basophils Absolute: 0 10*3/uL (ref 0.0–0.1)
Basophils Relative: 1 %
Eosinophils Absolute: 0.1 10*3/uL (ref 0.0–0.5)
Eosinophils Relative: 1 %
HCT: 43.8 % (ref 36.0–46.0)
Hemoglobin: 15.2 g/dL — ABNORMAL HIGH (ref 12.0–15.0)
Immature Granulocytes: 0 %
Lymphocytes Relative: 44 %
Lymphs Abs: 3 10*3/uL (ref 0.7–4.0)
MCH: 25.7 pg — ABNORMAL LOW (ref 26.0–34.0)
MCHC: 34.7 g/dL (ref 30.0–36.0)
MCV: 74 fL — ABNORMAL LOW (ref 80.0–100.0)
Monocytes Absolute: 0.5 10*3/uL (ref 0.1–1.0)
Monocytes Relative: 8 %
Neutro Abs: 3.1 10*3/uL (ref 1.7–7.7)
Neutrophils Relative %: 46 %
Platelets: 289 10*3/uL (ref 150–400)
RBC: 5.92 MIL/uL — ABNORMAL HIGH (ref 3.87–5.11)
RDW: 15 % (ref 11.5–15.5)
WBC: 6.7 10*3/uL (ref 4.0–10.5)
nRBC: 0 % (ref 0.0–0.2)

## 2020-12-29 LAB — BASIC METABOLIC PANEL
Anion gap: 9 (ref 5–15)
BUN: 10 mg/dL (ref 6–20)
CO2: 23 mmol/L (ref 22–32)
Calcium: 9.4 mg/dL (ref 8.9–10.3)
Chloride: 107 mmol/L (ref 98–111)
Creatinine, Ser: 0.85 mg/dL (ref 0.44–1.00)
GFR, Estimated: >60 mL/min (ref >60)
Glucose, Bld: 126 mg/dL — ABNORMAL HIGH (ref 70–99)
Potassium: 3.6 mmol/L (ref 3.5–5.1)
Sodium: 139 mmol/L (ref 135–145)

## 2020-12-29 MED ORDER — AMLODIPINE BESYLATE 5 MG PO TABS: 5.0000 mg | ORAL_TABLET | Freq: Every day | ORAL | 0 refills | Status: DC

## 2020-12-29 MED ORDER — SODIUM CHLORIDE 0.9 % IV BOLUS: 500.0000 mL | Freq: Once | INTRAVENOUS | Status: DC

## 2020-12-29 MED ORDER — DEXAMETHASONE SODIUM PHOSPHATE 10 MG/ML IJ SOLN
10.0000 mg | Freq: Once | INTRAMUSCULAR | Status: DC
  Filled 2020-12-29: qty 1

## 2020-12-29 MED ORDER — KETOROLAC TROMETHAMINE 30 MG/ML IJ SOLN
30.0000 mg | Freq: Once | INTRAMUSCULAR | Status: DC
  Filled 2020-12-29: qty 1

## 2020-12-29 MED ORDER — KETOROLAC TROMETHAMINE 30 MG/ML IJ SOLN
30.0000 mg | Freq: Once | INTRAMUSCULAR | Status: AC
  Administered 2020-12-29: 30 mg via INTRAMUSCULAR

## 2020-12-29 MED ORDER — DEXAMETHASONE SODIUM PHOSPHATE 10 MG/ML IJ SOLN
8.0000 mg | Freq: Once | INTRAMUSCULAR | Status: AC
  Administered 2020-12-29: 8 mg via INTRAMUSCULAR

## 2020-12-29 NOTE — Discharge Instructions (Addendum)
  Stay hydrated. You can take 600 mg of Advil/ibuprofen every 6 hours as needed for headache. Schedule an appointment with your primary care provider to follow up on your headache and discuss preventative treatment. Return to the ER for severely worsening headache, vision changes, fever, weakness or numbness, or new or concerning symptoms.

## 2020-12-29 NOTE — ED Provider Notes (Signed)
[facility] EMERGENCY DEPARTMENT Provider Note   CSN: [id number] Arrival date & time: 12/29/20  3419     History No chief complaint on file.   Shalunda Lindh is a 50 y.o. female past medical history of hypertension, Chiari I malformation, hysterectomy, presenting for evaluation of headache and lightheadedness.  She states she did not take her blood pressure medications over the last 2 days though began taking it again today.  She feels this way when she does not take her blood pressure medication and has hypertension.  She feels lightheaded that is worse with standing.  She also feels her typical posterior headache which is chronic for her.  Yesterday had some photophobia which is also typical of her headache, none today.  No nausea or vomiting, no loss of vision or double vision, no numbness or weakness anywhere.  She states she needs a refill of her blood pressure medication.  She is not having any room spinning dizziness, chest pain, shortness of breath.  The history is provided by the patient.      Past Medical History:  Diagnosis Date   Asthma    Bartholin cyst    Chiari malformation type I ([place])    Hernia, inguinal    Hernia, inguinal    Hypertension     Patient Active Problem List   Diagnosis Date Noted   Abdominal pain 06/09/2019   S/P laparoscopic hysterectomy 09/15/2018   Left breast lump 08/07/2018   Goiter 05/28/2018   Essential hypertension 05/28/2018   Cigarette nicotine dependence without complication 37/90/2409   Multinodular goiter 01/19/2016   Hyperthyroidism 09/25/2015   Fibroid, uterine    Fibroids 06/02/2014   Fibroid uterus 12/23/2013    Past Surgical History:  Procedure Laterality Date   fibroid freezing     unsure   IR GENERIC HISTORICAL  07/27/2014   IR RADIOLOGIST EVAL & MGMT 07/27/2014 Markus Daft, MD [facility] INTERV RAD   ROBOTIC ASSISTED TOTAL HYSTERECTOMY N/A 09/15/2018   Procedure: XI ROBOTIC ASSISTED TOTAL HYSTERECTOMY WITH  SALPINGECTOMY;  Surgeon: Lavonia Drafts, MD;  Location: [facility];  Service: Gynecology;  Laterality: N/A;     OB History     Gravida  2   Para  2   Term  2   Preterm      AB      Living  2      SAB      IAB      Ectopic      Multiple      Live Births  2           Family History  Problem Relation Age of Onset   Thyroid disease Mother        large benign goiter    Social History   Tobacco Use   Smoking status: Every Day    Packs/day: 1.00    Pack years: 0.00    Types: Cigarettes   Smokeless tobacco: Never  Vaping Use   Vaping Use: Never used  Substance Use Topics   Alcohol use: No   Drug use: No    Home Medications Prior to Admission medications   Medication Sig Start Date End Date Taking? Authorizing Provider  albuterol (VENTOLIN HFA) 108 (90 Base) MCG/ACT inhaler Inhale 1-2 puffs into the lungs every 6 (six) hours as needed for shortness of breath. 09/27/20   Tacy Learn, PA-C  amLODipine (NORVASC) 5 MG tablet Take 1 tablet (5 mg total) by mouth daily. 12/29/20  Ashliegh Parekh, [place] N, PA-C  cyclobenzaprine (FLEXERIL) 10 MG tablet Take 1 tablet by mouth 3 times daily as needed for muscle spasm. Warning: May cause drowsiness. Patient not taking: Reported on 09/27/2020 04/14/20   Vanessa Kick, MD  ibuprofen (ADVIL) 800 MG tablet Take 1 tablet (800 mg total) by mouth 3 (three) times daily with meals. Patient not taking: Reported on 09/27/2020 04/14/20   Vanessa Kick, MD    Allergies    Hydrocodone and Percocet [oxycodone-acetaminophen]  Review of Systems   Review of Systems  All other systems reviewed and are negative.  Physical Exam Updated Vital Signs BP (!) 156/103 (BP Location: Left Arm)   Pulse 63   Temp 98.4 F (36.9 C) (Oral)   Resp 17   LMP  (LMP Unknown)   SpO2 99%   Physical Exam Vitals and nursing note reviewed.  Constitutional:      Appearance: She is well-developed.  HENT:     Head: Normocephalic  and atraumatic.  Eyes:     Conjunctiva/sclera: Conjunctivae normal.  Cardiovascular:     Rate and Rhythm: Normal rate and regular rhythm.  Pulmonary:     Effort: Pulmonary effort is normal. No respiratory distress.     Breath sounds: Normal breath sounds.  Abdominal:     Palpations: Abdomen is soft.  Musculoskeletal:     Cervical back: Normal range of motion and neck supple. No rigidity or tenderness.  Skin:    General: Skin is warm.  Neurological:     General: No focal deficit present.     Mental Status: She is alert.     Cranial Nerves: No cranial nerve deficit.     Sensory: No sensory deficit.     Comments: Cranial nerves grossly intact.  Equal strength and sensation to all 4 extremities.  Speaking in full sentences without aphasia. Steady gait  Psychiatric:        Behavior: Behavior normal.    ED Results / Procedures / Treatments   Labs (all labs ordered are listed, but only abnormal results are displayed) Labs Reviewed  BASIC METABOLIC PANEL - Abnormal; Notable for the following components:      Result Value   Glucose, Bld 126 (*)    All other components within normal limits  CBC WITH DIFFERENTIAL/PLATELET - Abnormal; Notable for the following components:   RBC 5.92 (*)    Hemoglobin 15.2 (*)    MCV 74.0 (*)    MCH 25.7 (*)    All other components within normal limits    EKG EKG Interpretation  Date/Time:  Friday December 29 2020 07:14:46 EDT Ventricular Rate:  89 PR Interval:  152 QRS Duration: 78 QT Interval:  350 QTC Calculation: 425 R Axis:   -3 Text Interpretation: Sinus rhythm with occasional Premature ventricular complexes Nonspecific ST and T wave abnormality Abnormal ECG Confirmed by Quintella Reichert [phone]) on 12/29/2020 10:31:26 AM  Radiology No results found.  Procedures Procedures   Medications Ordered in ED Medications  ketorolac (TORADOL) 30 MG/ML injection 30 mg (30 mg Intramuscular Given 12/29/20 1122)  dexamethasone (DECADRON) injection 8  mg (8 mg Intramuscular Given 12/29/20 1123)    ED Course  I have reviewed the triage vital signs and the nursing notes.  Pertinent labs & imaging results that were available during my care of the patient were reviewed by me and considered in my medical decision making (see chart for details).    MDM Rules/Calculators/A&P  Patient is a 50 year old female with history of hypertension, chronic headache, presenting for evaluation of headache and hypertension as well as lightheadedness.  Admits to not taking her blood pressure medications over the last 2 days which she feels contributed to her symptoms.  She was feeling some lightheadedness with ambulation yesterday and posterior headache.  Posterior headache is typical of her recurrent headache and has no new features.  She has no neurodeficits.  Do not believe CT head is indicated due to no change in quality or severity of her headaches from baseline.  She is overall well-appearing on exam, ambulating with steady gait.  She is hypertensive here.  Initially in triage was not wanting any additional work-up, only medications for treating her headache, however was able to inform her the reasons for obtaining blood work and was agreeable.  Blood work is reassuring, no anemia or AKI.  She is treated for headache and reports significant improvement in symptoms, is actively requesting discharge.  Instructed importance of medication adherence and follow-up with PCP.  Refills provided though she is encouraged to obtain any additional refills by her PCP which she has established recently.  Return precautions.  Discharged in no distress  Discussed results, findings, treatment and follow up. Patient advised of return precautions. Patient verbalized understanding and agreed with plan.    Final Clinical Impression(s) / ED Diagnoses Final diagnoses:  Acute nonintractable headache, unspecified headache type  Essential hypertension    Rx /  DC Orders ED Discharge Orders          Ordered    amLODipine (NORVASC) 5 MG tablet  Daily        12/29/20 1157             Kahari Critzer, [place] N, [place] 12/29/20 1344    Quintella Reichert, MD 12/30/20 1340

## 2020-12-29 NOTE — ED Notes (Signed)
Pt continues to complain that she wants to go home.  I explained to her that as soon as I receive her paperwork from the ED care provider that she could go.  PA aware that pt is eager to go home

## 2020-12-29 NOTE — ED Triage Notes (Signed)
Pt here from home with c/o htn h/a and dizziness . She missed 2 days of her meds, started back yesterday

## 2020-12-29 NOTE — ED Notes (Signed)
ED Provider at bedside.( PA)

## 2020-12-29 NOTE — ED Notes (Signed)
I attempted to place an IV in pt's left hand and left AC without results, pt tolerated well but refuses any further attempts from anyone.  PA Quentin Cornwall notified.  Pt also states that she would like her meds given IM and that then she wants to go home.  PA Quentin Cornwall notified

## 2020-12-29 NOTE — ED Notes (Signed)
Pt stepped outside.  

## 2020-12-29 NOTE — ED Provider Notes (Signed)
Emergency Medicine Provider Triage Evaluation Note  Carol Mack , a 50 y.o. female  was evaluated in triage.  Pt complains of lightheadedness and headache x2 days.  States she did not take her blood pressure medication for 2 days.  Has about 6 tabs left and needs a refill.  Has some lightheadedness and posterior headache.  Her headache feels like her typical chronic migraine headache.  Had some photophobia yesterday, none today.  Lightheadedness is with standing mostly.  Her symptoms feel very typical from last time she did not take her blood pressure medications.  Review of Systems  Positive: HA, LH Negative: Vision change, n/v, CP, SOB, numbness, weakness, room-spinning dizzy  Physical Exam  BP (!) 154/113 (BP Location: Right Arm)   Pulse 83   Temp 98.8 F (37.1 C) (Oral)   Resp 16   LMP  (LMP Unknown)   SpO2 98%  Gen:   Awake, no distress   Resp:  Normal effort  MSK:   Moves extremities without difficulty  Other:  Heart sounds normal.  Cranial nerves grossly intact.  Equal strength and sensation to all 4 extremities.  Speaking in full sentences without aphasia.  Medical Decision Making  Medically screening exam initiated at 7:23 AM.  Appropriate orders placed.  Carol Mack was informed that the remainder of the evaluation will be completed by another provider, this initial triage assessment does not replace that evaluation, and the importance of remaining in the ED until their evaluation is complete.  Basic labs, EKG.   Abiel Antrim, [place] N, PA-C 12/29/20 0017    Quintella Reichert, MD 12/30/20 1340

## 2021-03-02 ENCOUNTER — Encounter: Payer: Self-pay | Admitting: Emergency Medicine

## 2021-03-02 ENCOUNTER — Ambulatory Visit
Admission: EM | Admit: 2021-03-02 | Discharge: 2021-03-02 | Disposition: A | Discharge status: Home / Self Care | Payer: Self-pay | Attending: Emergency Medicine | Admitting: Emergency Medicine

## 2021-03-02 DIAGNOSIS — R6889 Other general symptoms and signs: Secondary | ICD-10-CM

## 2021-03-02 DIAGNOSIS — R0902 Hypoxemia: Secondary | ICD-10-CM

## 2021-03-02 DIAGNOSIS — R0603 Acute respiratory distress: Secondary | ICD-10-CM

## 2021-03-02 MED ORDER — ACETAMINOPHEN 325 MG PO TABS
650.0000 mg | ORAL_TABLET | Freq: Once | ORAL | Status: AC
  Administered 2021-03-02: 650 mg via ORAL

## 2021-03-02 NOTE — ED Notes (Signed)
EMS called for transport to local ER, family member at bedside.

## 2021-03-02 NOTE — ED Provider Notes (Signed)
[facility]   [id number] 03/02/21 Arrival Time: [id number]  Cc: not feeling good Abbreviated note SUBJECTIVE: Patient not forthcoming with HPI  Carol Mack is a 50 y.o. female who presents with "not feeling good."  Reports nasal congestion, fever, and body aches that began yesterday.   Denies positive sick exposure or precipitating event.  Works in nursing home.  Denies cough.  Denies alleviating or aggravating factors.  Reports previous covid infection in the past.   Denies sore throat, SOB, wheezing, chest pain, nausea, changes in bowel or bladder habits.    ROS: As per HPI.  All other pertinent ROS negative.     Past Medical History:  Diagnosis Date   Asthma    Bartholin cyst    Chiari malformation type I ([place])    Hernia, inguinal    Hernia, inguinal    Hypertension    Past Surgical History:  Procedure Laterality Date   fibroid freezing     unsure   IR GENERIC HISTORICAL  07/27/2014   IR RADIOLOGIST EVAL & MGMT 07/27/2014 Markus Daft, MD [facility] INTERV RAD   ROBOTIC ASSISTED TOTAL HYSTERECTOMY N/A 09/15/2018   Procedure: XI ROBOTIC ASSISTED TOTAL HYSTERECTOMY WITH SALPINGECTOMY;  Surgeon: Lavonia Drafts, MD;  Location: [facility];  Service: Gynecology;  Laterality: N/A;   Allergies  Allergen Reactions   Hydrocodone Itching   Percocet [Oxycodone-Acetaminophen] Other (See Comments)    jittery   No current facility-administered medications on file prior to encounter.   Current Outpatient Medications on File Prior to Encounter  Medication Sig Dispense Refill   albuterol (VENTOLIN HFA) 108 (90 Base) MCG/ACT inhaler Inhale 1-2 puffs into the lungs every 6 (six) hours as needed for shortness of breath. 2 each 1   amLODipine (NORVASC) 5 MG tablet Take 1 tablet (5 mg total) by mouth daily. 30 tablet 0   cyclobenzaprine (FLEXERIL) 10 MG tablet Take 1 tablet by mouth 3 times daily as needed for muscle spasm. Warning: May cause drowsiness. (Patient not  taking: Reported on 09/27/2020) 21 tablet 0   ibuprofen (ADVIL) 800 MG tablet Take 1 tablet (800 mg total) by mouth 3 (three) times daily with meals. (Patient not taking: Reported on 09/27/2020) 21 tablet 0    Social History   Socioeconomic History   Marital status: Significant Other    Spouse name: Not on file   Number of children: Not on file   Years of education: Not on file   Highest education level: Not on file  Occupational History   Not on file  Tobacco Use   Smoking status: Every Day    Packs/day: 1.00    Types: Cigarettes   Smokeless tobacco: Never  Vaping Use   Vaping Use: Never used  Substance and Sexual Activity   Alcohol use: No   Drug use: No   Sexual activity: Not on file  Other Topics Concern   Not on file  Social History Narrative   Not on file   Social Determinants of Health   Financial Resource Strain: Not on file  Food Insecurity: Not on file  Transportation Needs: Not on file  Physical Activity: Not on file  Stress: Not on file  Social Connections: Not on file  Intimate Partner Violence: Not on file   Family History  Problem Relation Age of Onset   Thyroid disease Mother        large benign goiter     OBJECTIVE:  Vitals:   03/02/21 1434 03/02/21 1438  BP:  124/89   Pulse: (!) 115   Resp: (!) 22 (!) 30  Temp: 100.3 F (37.9 C)   TempSrc: Oral   SpO2: 90%     Oxygen saturations in the mid to upper 80's- low 90's  General appearance: Alert, appears fatigued; respiratory distress; speaks in fractured sentences HEENT:NCAT; Ears: EACs clear, TMs pearly gray; Eyes: PERRL.  EOM grossly intact. Nose: nares patent without rhinorrhea; throat: tolerating own secretions Neck: supple without LAD Lungs: clear to auscultation over anterior chest, patient refuses to sit up in exam bed to have posterior lung sounds assessed; no cough present Heart: tachycardia Skin: warm and dry Psychological: alert and cooperative; agitated mood and  affect   ASSESSMENT & PLAN:  1. Flu-like symptoms   2. Hypoxia   3. Respiratory distress     Recommending further evaluation and management in the ED for hypoxia and labored respirations with flu like symptoms.  Tylenol given in office.  Oxygen 2 L place.  EMS called and transferred patient to [facility] ED in stable condition.             Lestine Box, PA-C 03/02/21 1450

## 2021-03-02 NOTE — ED Triage Notes (Addendum)
Pt presents today with c/o of nasal congestion, fever and body aches that began yesterday. No home Covid tests. Pt also reports being SOB with dizziness.

## 2021-03-04 ENCOUNTER — Encounter (HOSPITAL_COMMUNITY): Payer: Self-pay

## 2021-03-04 ENCOUNTER — Other Ambulatory Visit: Payer: Self-pay

## 2021-03-04 ENCOUNTER — Emergency Department (HOSPITAL_COMMUNITY)
Admission: EM | Admit: 2021-03-04 | Discharge: 2021-03-04 | Disposition: A | Discharge status: Home / Self Care | Payer: Self-pay | Attending: Student | Admitting: Student

## 2021-03-04 DIAGNOSIS — U071 COVID-19: Secondary | ICD-10-CM | POA: Insufficient documentation

## 2021-03-04 DIAGNOSIS — Z79899 Other long term (current) drug therapy: Secondary | ICD-10-CM | POA: Insufficient documentation

## 2021-03-04 DIAGNOSIS — F17210 Nicotine dependence, cigarettes, uncomplicated: Secondary | ICD-10-CM | POA: Insufficient documentation

## 2021-03-04 DIAGNOSIS — I10 Essential (primary) hypertension: Secondary | ICD-10-CM | POA: Insufficient documentation

## 2021-03-04 DIAGNOSIS — J45909 Unspecified asthma, uncomplicated: Secondary | ICD-10-CM | POA: Insufficient documentation

## 2021-03-04 LAB — POC SARS CORONAVIRUS 2 AG -  ED: SARSCOV2ONAVIRUS 2 AG: POSITIVE — AB

## 2021-03-04 NOTE — ED Provider Notes (Signed)
[facility] EMERGENCY DEPARTMENT Provider Note   CSN: [id number] Arrival date & time: 03/04/21  [id number]     History Chief Complaint  Patient presents with   Generalized Body Aches    Carol Mack is a 50 y.o. female with PMH asthma, Chiari malformation type I, HTN who presents to the emergency department for evaluation of generalized body aches.  She was at urgent care yesterday where she was found to be mildly hypoxic and placed on 2 L but requested to go home and rest instead of coming to the emergency department.  She arrives to the emergency department today satting 98% on room air but endorses generalized body aches, headache and myalgias.  She is requesting COVID testing in the emergency department today.  Denies chest pain, shortness of breath, area, abdominal pain or other systemic symptoms.  HPI     Past Medical History:  Diagnosis Date   Asthma    Bartholin cyst    Chiari malformation type I ([place])    Hernia, inguinal    Hernia, inguinal    Hypertension     Patient Active Problem List   Diagnosis Date Noted   Abdominal pain 06/09/2019   S/P laparoscopic hysterectomy 09/15/2018   Left breast lump 08/07/2018   Goiter 05/28/2018   Essential hypertension 05/28/2018   Cigarette nicotine dependence without complication [id number]   Multinodular goiter 01/19/2016   Hyperthyroidism 09/25/2015   Fibroid, uterine    Fibroids 06/02/2014   Fibroid uterus 12/23/2013    Past Surgical History:  Procedure Laterality Date   fibroid freezing     unsure   IR GENERIC HISTORICAL  07/27/2014   IR RADIOLOGIST EVAL & MGMT 07/27/2014 Markus Daft, MD [facility] INTERV RAD   ROBOTIC ASSISTED TOTAL HYSTERECTOMY N/A 09/15/2018   Procedure: XI ROBOTIC ASSISTED TOTAL HYSTERECTOMY WITH SALPINGECTOMY;  Surgeon: Lavonia Drafts, MD;  Location: [facility];  Service: Gynecology;  Laterality: N/A;     OB History     Gravida  2   Para  2   Term  2   Preterm      AB       Living  2      SAB      IAB      Ectopic      Multiple      Live Births  2           Family History  Problem Relation Age of Onset   Thyroid disease Mother        large benign goiter    Social History   Tobacco Use   Smoking status: Every Day    Packs/day: 1.00    Types: Cigarettes   Smokeless tobacco: Never  Vaping Use   Vaping Use: Never used  Substance Use Topics   Alcohol use: No   Drug use: No    Home Medications Prior to Admission medications   Medication Sig Start Date End Date Taking? Authorizing Provider  albuterol (VENTOLIN HFA) 108 (90 Base) MCG/ACT inhaler Inhale 1-2 puffs into the lungs every 6 (six) hours as needed for shortness of breath. 09/27/20   Tacy Learn, PA-C  amLODipine (NORVASC) 5 MG tablet Take 1 tablet (5 mg total) by mouth daily. 12/29/20   Robinson, [place] N, PA-C  cyclobenzaprine (FLEXERIL) 10 MG tablet Take 1 tablet by mouth 3 times daily as needed for muscle spasm. Warning: May cause drowsiness. Patient not taking: Reported on 09/27/2020 04/14/20   Vanessa Kick, MD  ibuprofen (ADVIL) 800 MG tablet Take 1 tablet (800 mg total) by mouth 3 (three) times daily with meals. Patient not taking: Reported on 09/27/2020 04/14/20   Vanessa Kick, MD    Allergies    Hydrocodone and Percocet [oxycodone-acetaminophen]  Review of Systems   Review of Systems  Constitutional:  Positive for chills. Negative for fever.  HENT:  Negative for ear pain and sore throat.   Eyes:  Negative for pain and visual disturbance.  Respiratory:  Negative for cough and shortness of breath.   Cardiovascular:  Negative for chest pain and palpitations.  Gastrointestinal:  Negative for abdominal pain and vomiting.  Genitourinary:  Negative for dysuria and hematuria.  Musculoskeletal:  Positive for myalgias. Negative for arthralgias and back pain.  Skin:  Negative for color change and rash.  Neurological:  Negative for seizures and syncope.  All other systems  reviewed and are negative.  Physical Exam Updated Vital Signs BP 114/86   Pulse 84   Temp 98.4 F (36.9 C) (Oral)   Resp 17   Ht '5\' 4"'$  (1.626 m)   Wt 63.5 kg   LMP  (LMP Unknown)   SpO2 98%   BMI 24.03 kg/m   Physical Exam Vitals and nursing note reviewed.  Constitutional:      General: She is not in acute distress.    Appearance: She is well-developed.  HENT:     Head: Normocephalic and atraumatic.  Eyes:     Conjunctiva/sclera: Conjunctivae normal.  Cardiovascular:     Rate and Rhythm: Normal rate and regular rhythm.     Heart sounds: No murmur heard. Pulmonary:     Effort: Pulmonary effort is normal. No respiratory distress.     Breath sounds: Normal breath sounds.  Abdominal:     Palpations: Abdomen is soft.     Tenderness: There is no abdominal tenderness.  Musculoskeletal:     Cervical back: Neck supple.  Skin:    General: Skin is warm and dry.  Neurological:     Mental Status: She is alert.    ED Results / Procedures / Treatments   Labs (all labs ordered are listed, but only abnormal results are displayed) Labs Reviewed  POC SARS CORONAVIRUS 2 AG -  ED - Abnormal; Notable for the following components:      Result Value   SARSCOV2ONAVIRUS 2 AG POSITIVE (*)    All other components within normal limits    EKG None  Radiology No results found.  Procedures Procedures   Medications Ordered in ED Medications - No data to display  ED Course  I have reviewed the triage vital signs and the nursing notes.  Pertinent labs & imaging results that were available during my care of the patient were reviewed by me and considered in my medical decision making (see chart for details).    MDM Rules/Calculators/A&P                           Patient seen emergency department for evaluation of myalgia and flulike symptoms.  Physical exam reveals a fatigued appearing patient but vital signs are stable and cardiopulmonary exam unremarkable, abdominal exam  unremarkable.  Rapid COVID positive.  Patient able to ambulate without difficulty in the emergency department and sats remained stable on room air.  Patient then discharged.  Final Clinical Impression(s) / ED Diagnoses Final diagnoses:  COVID    Rx / DC Orders ED Discharge Orders  None        Teressa Lower, MD 03/04/21 [phone]

## 2021-03-04 NOTE — ED Triage Notes (Signed)
Pt to er, pt states that she was at urgent care yesterday and they wanted her to come to the er for low O2 sat, states that the ems came and said that she had covid and she opted to go home and rest, states that she is here today because she thinks that she has covid with cough, body aches and headache, since thursday

## 2021-03-04 NOTE — ED Triage Notes (Signed)
Pt unable to sign sig pad doesn't work

## 2021-03-04 NOTE — ED Notes (Signed)
Pt declined vs on discharge

## 2021-10-28 ENCOUNTER — Other Ambulatory Visit: Payer: Self-pay

## 2021-10-28 ENCOUNTER — Encounter (HOSPITAL_COMMUNITY): Payer: Self-pay | Admitting: Emergency Medicine

## 2021-10-28 ENCOUNTER — Emergency Department (HOSPITAL_COMMUNITY)
Admission: EM | Admit: 2021-10-28 | Discharge: 2021-10-29 | Discharge status: Against Medical Advice | Payer: Self-pay | Attending: Physician Assistant | Admitting: Physician Assistant

## 2021-10-28 DIAGNOSIS — Z5321 Procedure and treatment not carried out due to patient leaving prior to being seen by health care provider: Secondary | ICD-10-CM | POA: Insufficient documentation

## 2021-10-28 DIAGNOSIS — R519 Headache, unspecified: Secondary | ICD-10-CM | POA: Insufficient documentation

## 2021-10-28 DIAGNOSIS — R0981 Nasal congestion: Secondary | ICD-10-CM | POA: Insufficient documentation

## 2021-10-28 NOTE — ED Provider Triage Note (Signed)
Emergency Medicine Provider Triage Evaluation Note ? ?Carol Mack , a 51 y.o. female  was evaluated in triage.  Pt complains of headache and nasal congestion. ?She reports that she has a history of headaches.  Her nasal congestion started yesterday. ?She took a Benadryl earlier today, and she took a Mucinex at about 4 PM without relief in her symptoms. ? ?She denies any fevers.  She denies any known sick contacts. ?She refuses any imaging of her head today stating "I have already had that and I am not having it done again." ? ?She refuses any Tylenol stating that "it will not help." ?She states that she just needs the shot that she usually gets for her head. ? ? ?Physical Exam  ?BP (!) 156/103 (BP Location: Right Arm)   Pulse 79   Temp 98.9 ?F (37.2 ?C) (Oral)   Resp 18   LMP  (LMP Unknown)   SpO2 94%  ?Gen:   Awake, no distress   ?Resp:  Normal effort  ?MSK:   Moves extremities without difficulty  ?Other:  Normal speech.  ? ?Medical Decision Making  ?Medically screening exam initiated at 9:31 PM.  Appropriate orders placed.  Carol Mack was informed that the remainder of the evaluation will be completed by another provider, this initial triage assessment does not replace that evaluation, and the importance of remaining in the ED until their evaluation is complete. ? ? ?  ?Lorin Glass, PA-C ?10/28/21 2132 ? ?

## 2021-10-29 NOTE — ED Notes (Signed)
Pt left AMA °

## 2022-02-13 ENCOUNTER — Ambulatory Visit (HOSPITAL_COMMUNITY)
Admission: EM | Admit: 2022-02-13 | Discharge: 2022-02-13 | Disposition: A | Discharge status: Home / Self Care | Payer: Self-pay | Attending: Family Medicine | Admitting: Family Medicine

## 2022-02-13 ENCOUNTER — Encounter (HOSPITAL_COMMUNITY): Payer: Self-pay

## 2022-02-13 DIAGNOSIS — R220 Localized swelling, mass and lump, head: Secondary | ICD-10-CM

## 2022-02-13 DIAGNOSIS — L299 Pruritus, unspecified: Secondary | ICD-10-CM

## 2022-02-13 DIAGNOSIS — I10 Essential (primary) hypertension: Secondary | ICD-10-CM

## 2022-02-13 DIAGNOSIS — R350 Frequency of micturition: Secondary | ICD-10-CM

## 2022-02-13 LAB — POCT URINALYSIS DIPSTICK, ED / UC
Glucose, UA: NEGATIVE mg/dL
Hgb urine dipstick: NEGATIVE
Ketones, ur: NEGATIVE mg/dL
Leukocytes,Ua: NEGATIVE
Nitrite: NEGATIVE
Protein, ur: NEGATIVE mg/dL
Specific Gravity, Urine: >=1.03 (ref 1.005–1.030)
Urobilinogen, UA: 1 mg/dL (ref 0.0–1.0)
pH: 5.5 (ref 5.0–8.0)

## 2022-02-13 LAB — CBG MONITORING, ED: Glucose-Capillary: 100 mg/dL — ABNORMAL HIGH (ref 70–99)

## 2022-02-13 MED ORDER — AMLODIPINE BESYLATE 5 MG PO TABS: 5.0000 mg | ORAL_TABLET | Freq: Every day | ORAL | 1 refills | Status: DC

## 2022-02-13 MED ORDER — PREDNISONE 20 MG PO TABS: 40.0000 mg | ORAL_TABLET | Freq: Every day | ORAL | 0 refills | Status: DC

## 2022-02-13 NOTE — ED Triage Notes (Signed)
Pt c/o swelling to lt side of face since Monday night. Unknown allergies, states thought it was her sinuses. No distress noted.  Pt c/o urinary urgency/frequency since Monday.

## 2022-02-13 NOTE — ED Provider Notes (Signed)
Carol Mack   [id number] 02/13/22 Arrival Time: 5009  ASSESSMENT & PLAN:  1. Facial swelling   2. Itching   3. Urinary frequency   4. Essential hypertension   5. Elevated blood pressure reading in office with diagnosis of hypertension    No resp/swallowing difficulties. Likely allergic. Unknown trigger.  No signs of infection on U/A.  Labs Reviewed  POCT URINALYSIS DIPSTICK, ED / UC - Abnormal; Notable for the following components:      Result Value   Bilirubin Urine SMALL (*)    All other components within normal limits  CBG MONITORING, ED - Abnormal; Notable for the following components:   Glucose-Capillary 100 (*)    All other components within normal limits   Meds ordered this encounter  Medications   predniSONE (DELTASONE) 20 MG tablet    Sig: Take 2 tablets (40 mg total) by mouth daily.    Dispense:  10 tablet    Refill:  0   amLODipine (NORVASC) 5 MG tablet    Sig: Take 1 tablet (5 mg total) by mouth daily.    Dispense:  30 tablet    Refill:  1   HTN med refilled at request. Elevated BP here.  Reviewed expectations re: course of current medical issues. Questions answered. Outlined signs and symptoms indicating need for more acute intervention. Patient verbalized understanding. After Visit Summary given.   SUBJECTIVE: History from: patient. Carol Mack is a 51 y.o. female who presents for evaluation of a possible allergic reaction. L facial swelling; noted last evening; unknown trigger. No swallowing/resp difficulties. Denies wheezing. Patient denies similar previous allergic reactions. Patient denies exposure to new medications or allergens. No tx PTA.  Also noted urinary frequency; past few days. No hematuria. No dysuria.  Increased blood pressure noted today. Reports that she is treated for HTN. She reports taking medications as instructed, no chest pain on exertion, no dyspnea on exertion, no swelling of ankles, no orthostatic dizziness or  lightheadedness, no orthopnea or paroxysmal nocturnal dyspnea, and no palpitations.   OBJECTIVE:  Vitals:   02/13/22 1114  BP: (!) 143/98  Pulse: 80  Resp: 18  Temp: 98 F (36.7 C)  TempSrc: Oral  SpO2: 96%    General appearance: alert; no distress Eyes: PERRLA; EOMI; conjunctivae normal HENT: normocephalic; atraumatic; nasal mucosa normal; oral mucosa normal; mild swelling of LEFT cheek and upper LEFT lip; normal jaw movement; slight edema under LEFT eye Neck: supple  Lungs: clear to auscultation bilaterally Heart: regular Back: no CVA tenderness Extremities: no cyanosis or edema; symmetrical with no gross deformities Skin: warm and dry Neurologic: normal gait; normal symmetric reflexes Psychological: alert and cooperative; normal mood and affect  Labs: Results for orders placed or performed during the hospital encounter of 02/13/22  POC Urinalysis dipstick  Result Value Ref Range   Glucose, UA NEGATIVE NEGATIVE mg/dL   Bilirubin Urine SMALL (A) NEGATIVE   Ketones, ur NEGATIVE NEGATIVE mg/dL   Specific Gravity, Urine >=1.030 1.005 - 1.030   Hgb urine dipstick NEGATIVE NEGATIVE   pH 5.5 5.0 - 8.0   Protein, ur NEGATIVE NEGATIVE mg/dL   Urobilinogen, UA 1.0 0.0 - 1.0 mg/dL   Nitrite NEGATIVE NEGATIVE   Leukocytes,Ua NEGATIVE NEGATIVE   Labs Reviewed  POCT URINALYSIS DIPSTICK, ED / UC - Abnormal; Notable for the following components:      Result Value   Bilirubin Urine SMALL (*)    All other components within normal limits  CBG MONITORING, ED  Allergies  Allergen Reactions   Hydrocodone Itching   Percocet [Oxycodone-Acetaminophen] Other (See Comments)    jittery    Past Medical History:  Diagnosis Date   Asthma    Bartholin cyst    Chiari malformation type I (HCC)    Hernia, inguinal    Hernia, inguinal    Hypertension    Social History   Socioeconomic History   Marital status: Significant Other    Spouse name: Not on file   Number of children:  Not on file   Years of education: Not on file   Highest education level: Not on file  Occupational History   Not on file  Tobacco Use   Smoking status: Every Day    Packs/day: 1.00    Types: Cigarettes   Smokeless tobacco: Never  Vaping Use   Vaping Use: Never used  Substance and Sexual Activity   Alcohol use: No   Drug use: No   Sexual activity: Not on file  Other Topics Concern   Not on file  Social History Narrative   Not on file   Social Determinants of Health   Financial Resource Strain: Not on file  Food Insecurity: Not on file  Transportation Needs: Not on file  Physical Activity: Not on file  Stress: Not on file  Social Connections: Not on file  Intimate Partner Violence: Not on file   Family History  Problem Relation Age of Onset   Thyroid disease Mother        large benign goiter   Past Surgical History:  Procedure Laterality Date   fibroid freezing     unsure   IR GENERIC HISTORICAL  07/27/2014   IR RADIOLOGIST EVAL & MGMT 07/27/2014 Markus Daft, MD [facility] INTERV RAD   ROBOTIC ASSISTED TOTAL HYSTERECTOMY N/A 09/15/2018   Procedure: XI ROBOTIC ASSISTED TOTAL HYSTERECTOMY WITH SALPINGECTOMY;  Surgeon: Lavonia Drafts, MD;  Location: [facility];  Service: Gynecology;  Laterality: N/AVanessa Kick, MD 02/14/22 [phone]

## 2022-04-06 ENCOUNTER — Ambulatory Visit (HOSPITAL_COMMUNITY)
Admission: EM | Admit: 2022-04-06 | Discharge: 2022-04-06 | Disposition: A | Discharge status: Home / Self Care | Payer: Self-pay | Attending: Emergency Medicine | Admitting: Emergency Medicine

## 2022-04-06 ENCOUNTER — Encounter (HOSPITAL_COMMUNITY): Payer: Self-pay | Admitting: *Deleted

## 2022-04-06 ENCOUNTER — Ambulatory Visit (INDEPENDENT_AMBULATORY_CARE_PROVIDER_SITE_OTHER): Payer: Self-pay

## 2022-04-06 ENCOUNTER — Other Ambulatory Visit: Payer: Self-pay

## 2022-04-06 DIAGNOSIS — F17200 Nicotine dependence, unspecified, uncomplicated: Secondary | ICD-10-CM

## 2022-04-06 DIAGNOSIS — R059 Cough, unspecified: Secondary | ICD-10-CM

## 2022-04-06 DIAGNOSIS — J069 Acute upper respiratory infection, unspecified: Secondary | ICD-10-CM

## 2022-04-06 MED ORDER — BENZONATATE 100 MG PO CAPS: 100.0000 mg | ORAL_CAPSULE | Freq: Three times a day (TID) | ORAL | 0 refills | Status: AC | PRN

## 2022-04-06 MED ORDER — GUAIFENESIN ER 600 MG PO TB12: 600.0000 mg | ORAL_TABLET | Freq: Two times a day (BID) | ORAL | 0 refills | Status: AC

## 2022-04-06 NOTE — Discharge Instructions (Addendum)
I recommend mucinex twice daily for the next 5 days. You can try the tessalon 3x daily to help with cough.  I recommend cutting back on smoking, especially while you have these symptoms. Smoking can prolong your virus and cause cough to linger.  Please return to the urgent care if symptoms do not improve, or go to the emergency department if symptoms worsen.

## 2022-04-06 NOTE — ED Triage Notes (Signed)
PT has not taken her BP med today

## 2022-04-06 NOTE — ED Triage Notes (Signed)
PT reports cough started last . Pt reports she has no other Sx's but thinks she has a URI. Pt was tested at work last week for [place] and had a Neg result.

## 2022-04-06 NOTE — ED Provider Notes (Signed)
[facility]    CSN: [id number] Arrival date & time: 04/06/22  1619     History   Chief Complaint Chief Complaint  Patient presents with   Cough    HPI Carol Mack is a 51 y.o. female.  Presents with 1 week history of productive cough Some nasal congestion Denies fever, chills, sore throat, GI symptoms  Tried mucinex twice, cough medicine once  Had a negative covid test at work at onset of symptoms   Current smoker, 1 pack daily, has still been smoking during these symptoms  Past Medical History:  Diagnosis Date   Asthma    Bartholin cyst    Chiari malformation type I ([place])    Hernia, inguinal    Hernia, inguinal    Hypertension     Patient Active Problem List   Diagnosis Date Noted   Abdominal pain 06/09/2019   S/P laparoscopic hysterectomy 09/15/2018   Left breast lump 08/07/2018   Goiter 05/28/2018   Essential hypertension 05/28/2018   Cigarette nicotine dependence without complication 50/53/9767   Multinodular goiter 01/19/2016   Hyperthyroidism 09/25/2015   Fibroid, uterine    Fibroids 06/02/2014   Fibroid uterus 12/23/2013    Past Surgical History:  Procedure Laterality Date   fibroid freezing     unsure   IR GENERIC HISTORICAL  07/27/2014   IR RADIOLOGIST EVAL & MGMT 07/27/2014 Markus Daft, MD [facility] INTERV RAD   ROBOTIC ASSISTED TOTAL HYSTERECTOMY N/A 09/15/2018   Procedure: XI ROBOTIC ASSISTED TOTAL HYSTERECTOMY WITH SALPINGECTOMY;  Surgeon: Lavonia Drafts, MD;  Location: [facility];  Service: Gynecology;  Laterality: N/A;    OB History     Gravida  2   Para  2   Term  2   Preterm      AB      Living  2      SAB      IAB      Ectopic      Multiple      Live Births  2            Home Medications    Prior to Admission medications   Medication Sig Start Date End Date Taking? Authorizing Provider  albuterol (VENTOLIN HFA) 108 (90 Base) MCG/ACT inhaler Inhale 1-2 puffs into the lungs  every 6 (six) hours as needed for shortness of breath. 09/27/20   Tacy Learn, PA-C  amLODipine (NORVASC) 5 MG tablet Take 1 tablet (5 mg total) by mouth daily. 02/13/22   Vanessa Kick, MD  benzonatate (TESSALON) 100 MG capsule Take 1 capsule (100 mg total) by mouth 3 (three) times daily as needed for up to 5 days for cough. 04/06/22 04/11/22 Yes Gerrad Welker, Wells Guiles, PA-C  cyclobenzaprine (FLEXERIL) 10 MG tablet Take 1 tablet by mouth 3 times daily as needed for muscle spasm. Warning: May cause drowsiness. Patient not taking: Reported on 09/27/2020 04/14/20   Vanessa Kick, MD  guaiFENesin (MUCINEX) 600 MG 12 hr tablet Take 1 tablet (600 mg total) by mouth 2 (two) times daily for 5 days. 04/06/22 04/11/22 Yes Rusti Arizmendi, Wells Guiles, PA-C  ibuprofen (ADVIL) 800 MG tablet Take 1 tablet (800 mg total) by mouth 3 (three) times daily with meals. Patient not taking: Reported on 09/27/2020 04/14/20   Vanessa Kick, MD  predniSONE (DELTASONE) 20 MG tablet Take 2 tablets (40 mg total) by mouth daily. 02/13/22   Vanessa Kick, MD    Family History Family History  Problem Relation Age of Onset  Thyroid disease Mother        large benign goiter    Social History Social History   Tobacco Use   Smoking status: Every Day    Packs/day: 1.00    Types: Cigarettes   Smokeless tobacco: Never  Vaping Use   Vaping Use: Never used  Substance Use Topics   Alcohol use: No   Drug use: No     Allergies   Hydrocodone and Percocet [oxycodone-acetaminophen]   Review of Systems Review of Systems  Respiratory:  Positive for cough.    Per HPI  Physical Exam Triage Vital Signs ED Triage Vitals  Enc Vitals Group     BP 04/06/22 1711 (!) 164/112     Pulse Rate 04/06/22 1711 83     Resp 04/06/22 1711 18     Temp 04/06/22 1711 98.2 F (36.8 C)     Temp src --      SpO2 04/06/22 1711 94 %     Weight --      Height --      Head Circumference --      Peak Flow --      Pain Score 04/06/22 1709 0     Pain Loc --       Pain Edu? --      Excl. in [place]? --    No data found.  Updated Vital Signs BP (!) 164/112   Pulse 83   Temp 98.2 F (36.8 C)   Resp 18   LMP  (LMP Unknown)   SpO2 94%    Physical Exam Vitals and nursing note reviewed.  Constitutional:      General: She is not in acute distress. HENT:     Mouth/Throat:     Mouth: Mucous membranes are moist.     Pharynx: Uvula midline. No posterior oropharyngeal erythema.     Tonsils: No tonsillar exudate or tonsillar abscesses.  Eyes:     Conjunctiva/sclera: Conjunctivae normal.  Cardiovascular:     Rate and Rhythm: Normal rate and regular rhythm.     Heart sounds: Normal heart sounds.  Pulmonary:     Effort: Pulmonary effort is normal. No respiratory distress.     Breath sounds: Rales present. No wheezing.     Comments: Productive, wet sounding cough. Faint rales lower lobes Lymphadenopathy:     Cervical: No cervical adenopathy.  Neurological:     Mental Status: She is alert and oriented to person, place, and time.      UC Treatments / Results  Labs (all labs ordered are listed, but only abnormal results are displayed) Labs Reviewed - No data to display  EKG   Radiology DG Chest 2 View  Result Date: 04/06/2022 CLINICAL DATA:  Cough EXAM: CHEST - 2 VIEW COMPARISON:  09/05/2011 FINDINGS: Diffuse interstitial prominence throughout the lungs. No confluent airspace opacities or effusions. Heart is normal size. No acute bony abnormality. IMPRESSION: Diffuse interstitial prominence throughout the lungs could reflect chronic interstitial lung disease or atypical infection. Electronically Signed   By: Rolm Baptise M.D.   On: 04/06/2022 18:07    Procedures Procedures   Medications Ordered in UC Medications - No data to display  Initial Impression / Assessment and Plan / UC Course  I have reviewed the triage vital signs and the nursing notes.  Pertinent labs & imaging results that were available during my care of the patient were  reviewed by me and considered in my medical decision making (see chart for details).  Negative xray, some chronic interstitial prominence likely related to smoking status. Likely viral etiology of symptoms Recommend mucinex 2x daily, tessalon 3x daily PRN Discussed smoking cessation and effect of smoking on lungs Return precautions discussed. Patient agrees to plan  Final Clinical Impressions(s) / UC Diagnoses   Final diagnoses:  Viral URI with cough  Current every day smoker     Discharge Instructions      I recommend mucinex twice daily for the next 5 days. You can try the tessalon 3x daily to help with cough.  I recommend cutting back on smoking, especially while you have these symptoms. Smoking can prolong your virus and cause cough to linger.  Please return to the urgent care if symptoms do not improve, or go to the emergency department if symptoms worsen.     ED Prescriptions     Medication Sig Dispense Auth. Provider   benzonatate (TESSALON) 100 MG capsule Take 1 capsule (100 mg total) by mouth 3 (three) times daily as needed for up to 5 days for cough. 15 capsule Liberty Stead, PA-C   guaiFENesin (MUCINEX) 600 MG 12 hr tablet Take 1 tablet (600 mg total) by mouth 2 (two) times daily for 5 days. 10 tablet Toma Arts, Wells Guiles, PA-C      PDMP not reviewed this encounter.   Chioke Noxon, Vernice Jefferson 04/06/22 1816

## 2022-08-04 ENCOUNTER — Ambulatory Visit (HOSPITAL_COMMUNITY)
Admission: EM | Admit: 2022-08-04 | Discharge: 2022-08-04 | Disposition: A | Discharge status: Home / Self Care | Payer: Commercial Managed Care - PPO | Attending: Internal Medicine | Admitting: Internal Medicine

## 2022-08-04 ENCOUNTER — Encounter (HOSPITAL_COMMUNITY): Payer: Self-pay

## 2022-08-04 DIAGNOSIS — J4521 Mild intermittent asthma with (acute) exacerbation: Secondary | ICD-10-CM

## 2022-08-04 DIAGNOSIS — R0981 Nasal congestion: Secondary | ICD-10-CM

## 2022-08-04 DIAGNOSIS — N611 Abscess of the breast and nipple: Secondary | ICD-10-CM

## 2022-08-04 DIAGNOSIS — J069 Acute upper respiratory infection, unspecified: Secondary | ICD-10-CM | POA: Diagnosis not present

## 2022-08-04 MED ORDER — BENZONATATE 100 MG PO CAPS: 100.0000 mg | ORAL_CAPSULE | Freq: Three times a day (TID) | ORAL | 0 refills | Status: DC

## 2022-08-04 MED ORDER — PREDNISONE 10 MG PO TABS: 40.0000 mg | ORAL_TABLET | Freq: Every day | ORAL | 0 refills | Status: AC

## 2022-08-04 MED ORDER — DOXYCYCLINE HYCLATE 100 MG PO CAPS: 100.0000 mg | ORAL_CAPSULE | Freq: Two times a day (BID) | ORAL | 0 refills | Status: AC

## 2022-08-04 NOTE — Discharge Instructions (Addendum)
You have a viral upper respiratory infection that is likely triggering your asthma.  Use the following medicines to help with symptoms: - Plain Mucinex (guaifenesin) over the counter as directed every 12 hours to thin mucous so that you are able to get it out of your body easier. Drink plenty of water while taking this medication so that it works well in your body (at least 8 cups a day).  - Tylenol 1,'000mg'$  every 6 hours with food as needed for aches/pains or fever/chills.  - Tessalon perles every 8 hours as needed for cough. - Prednisone '40mg'$  once daily for the next 3 days.    Abscess treatment: - Doxycycline antibiotic 2 times a day (with breakfast and with dinner) for the next 7 days to treat infection. -Continue warm compresses to the abscess to allow for further drainage.  If you develop any new or worsening symptoms, please return.  If your symptoms are severe, please go to the emergency room.  Follow-up with your primary care provider for further evaluation and management of your symptoms as well as ongoing wellness visits.  I hope you feel better!

## 2022-08-04 NOTE — ED Provider Notes (Signed)
[facility]    CSN: [id number] Arrival date & time: 08/04/22  1507      History   Chief Complaint Chief Complaint  Patient presents with   Cough   Nasal Congestion   Wheezing    HPI Carol Mack is a 52 y.o. female.   Patient presents urgent care for evaluation of productive cough with yellow/green sputum, nasal congestion, wheezing, and shortness of breath that started on Monday, July 29, 2022 (6 days ago).  She has been taking Mucinex, Alka-Seltzer cold and flu plus, and TheraFlu with little relief of symptoms.  She has a history of asthma and is a current every day smoker.  She smokes approximately 1 pack of cigarettes per day.  Denies chest pain, heart palpitations, orthopnea, and leg swelling.  Coughing causes her to become short of breath intermittently. She has heard wheezing when coughing but has used her albuterol inhaler and this has helped with the wheeze.  She continues to feel like she cannot fully take a deep breath despite use of albuterol inhaler.  She also states she had some leftover prednisone from a previous prescription from an asthma exacerbation and has taken 2 doses of this (1 dose per day for the last 2 days).  She does not report very much relief of shortness of breath after taking the prednisone.  Denies recent antibiotic use.  No recent known sick contacts.  Patient last use her albuterol inhaler shortly prior to arrival urgent care with some relief of shortness of breath.  She would also like to be evaluated for abscess to the left medial breast that she first noticed several days ago and recently ruptured causing copious amount of purulent and foul smelling fluid to come out.  She states the area is still draining but the drainage has decreased in amount significantly.  She is concerned that the area may be infected as the drainage is very foul-smelling. She has never had an abscess in this area before. Denies recent fever/chills, nausea, vomiting,  or body aches. She has been suing warm compresses to the area and believes this is what caused the area to begin to drain.    Cough Associated symptoms: wheezing   Wheezing Associated symptoms: cough     Past Medical History:  Diagnosis Date   Asthma    Bartholin cyst    Chiari malformation type I ([place])    Hernia, inguinal    Hernia, inguinal    Hypertension     Patient Active Problem List   Diagnosis Date Noted   Abdominal pain 06/09/2019   S/P laparoscopic hysterectomy 09/15/2018   Left breast lump 08/07/2018   Goiter 05/28/2018   Essential hypertension 05/28/2018   Cigarette nicotine dependence without complication 19/41/7408   Multinodular goiter 01/19/2016   Hyperthyroidism 09/25/2015   Fibroid, uterine    Fibroids 06/02/2014   Fibroid uterus 12/23/2013    Past Surgical History:  Procedure Laterality Date   fibroid freezing     unsure   IR GENERIC HISTORICAL  07/27/2014   IR RADIOLOGIST EVAL & MGMT 07/27/2014 Markus Daft, MD [facility] INTERV RAD   ROBOTIC ASSISTED TOTAL HYSTERECTOMY N/A 09/15/2018   Procedure: XI ROBOTIC ASSISTED TOTAL HYSTERECTOMY WITH SALPINGECTOMY;  Surgeon: Lavonia Drafts, MD;  Location: [facility];  Service: Gynecology;  Laterality: N/A;    OB History     Gravida  2   Para  2   Term  2   Preterm      AB  Living  2      SAB      IAB      Ectopic      Multiple      Live Births  2            Home Medications    Prior to Admission medications   Medication Sig Start Date End Date Taking? Authorizing Provider  benzonatate (TESSALON) 100 MG capsule Take 1 capsule (100 mg total) by mouth every 8 (eight) hours. 08/04/22  Yes Talbot Grumbling, FNP  doxycycline (VIBRAMYCIN) 100 MG capsule Take 1 capsule (100 mg total) by mouth 2 (two) times daily for 7 days. 08/04/22 08/11/22 Yes Jissell Trafton, Stasia Cavalier, FNP  predniSONE (DELTASONE) 10 MG tablet Take 4 tablets (40 mg total) by mouth daily for 3 days.  08/04/22 08/07/22 Yes Talbot Grumbling, FNP  albuterol (VENTOLIN HFA) 108 (90 Base) MCG/ACT inhaler Inhale 1-2 puffs into the lungs every 6 (six) hours as needed for shortness of breath. 09/27/20   Tacy Learn, PA-C  amLODipine (NORVASC) 5 MG tablet Take 1 tablet (5 mg total) by mouth daily. 02/13/22   Vanessa Kick, MD  cyclobenzaprine (FLEXERIL) 10 MG tablet Take 1 tablet by mouth 3 times daily as needed for muscle spasm. Warning: May cause drowsiness. Patient not taking: Reported on 09/27/2020 04/14/20   Vanessa Kick, MD  ibuprofen (ADVIL) 800 MG tablet Take 1 tablet (800 mg total) by mouth 3 (three) times daily with meals. Patient not taking: Reported on 09/27/2020 04/14/20   Vanessa Kick, MD    Family History Family History  Problem Relation Age of Onset   Thyroid disease Mother        large benign goiter    Social History Social History   Tobacco Use   Smoking status: Every Day    Packs/day: 1.00    Types: Cigarettes   Smokeless tobacco: Never  Vaping Use   Vaping Use: Never used  Substance Use Topics   Alcohol use: No   Drug use: No     Allergies   Hydrocodone and Percocet [oxycodone-acetaminophen]   Review of Systems Review of Systems  Respiratory:  Positive for cough and wheezing.   Per HPI   Physical Exam Triage Vital Signs ED Triage Vitals  Enc Vitals Group     BP 08/04/22 1631 (!) 175/105     Pulse Rate 08/04/22 1631 66     Resp 08/04/22 1631 16     Temp 08/04/22 1631 98.1 F (36.7 C)     Temp Source 08/04/22 1631 Oral     SpO2 08/04/22 1631 96 %     Weight --      Height --      Head Circumference --      Peak Flow --      Pain Score 08/04/22 1634 0     Pain Loc --      Pain Edu? --      Excl. in [place]? --    No data found.  Updated Vital Signs BP (!) 175/105 (BP Location: Left Arm)   Pulse 66   Temp 98.1 F (36.7 C) (Oral)   Resp 16   LMP  (LMP Unknown)   SpO2 96%   Visual Acuity Right Eye Distance:   Left Eye Distance:    Bilateral Distance:    Right Eye Near:   Left Eye Near:    Bilateral Near:     Physical Exam Vitals and nursing note reviewed. Exam  conducted with a chaperone present.  Constitutional:      Appearance: She is not ill-appearing or toxic-appearing.  HENT:     Head: Normocephalic and atraumatic.     Right Ear: Hearing, tympanic membrane, ear canal and external ear normal.     Left Ear: Hearing, tympanic membrane, ear canal and external ear normal.     Nose: Congestion present.     Mouth/Throat:     Lips: Pink.     Mouth: Mucous membranes are moist.     Pharynx: No posterior oropharyngeal erythema.  Eyes:     General: Lids are normal. Vision grossly intact. Gaze aligned appropriately.        Right eye: No discharge.        Left eye: No discharge.     Extraocular Movements: Extraocular movements intact.     Conjunctiva/sclera: Conjunctivae normal.  Cardiovascular:     Rate and Rhythm: Normal rate and regular rhythm.     Heart sounds: Normal heart sounds, S1 normal and S2 normal.  Pulmonary:     Effort: Pulmonary effort is normal. No respiratory distress.     Breath sounds: Normal breath sounds and air entry.     Comments: No adventitious breath sounds heard to auscultation.  No respiratory distress or prolonged expiration present. Chest:  Breasts:    Right: Normal.     Left: Normal.    Abdominal:     General: Abdomen is flat. Bowel sounds are normal.     Palpations: Abdomen is soft.  Musculoskeletal:     Cervical back: Neck supple.  Lymphadenopathy:     Cervical: Cervical adenopathy present.  Skin:    General: Skin is warm and dry.     Capillary Refill: Capillary refill takes less than 2 seconds.     Findings: No rash.  Neurological:     General: No focal deficit present.     Mental Status: She is alert and oriented to person, place, and time. Mental status is at baseline.     Cranial Nerves: No dysarthria or facial asymmetry.  Psychiatric:        Mood and Affect:  Mood normal.        Speech: Speech normal.        Behavior: Behavior normal.        Thought Content: Thought content normal.        Judgment: Judgment normal.      UC Treatments / Results  Labs (all labs ordered are listed, but only abnormal results are displayed) Labs Reviewed - No data to display  EKG   Radiology No results found.  Procedures Procedures (including critical care time)  Medications Ordered in UC Medications - No data to display  Initial Impression / Assessment and Plan / UC Course  I have reviewed the triage vital signs and the nursing notes.  Pertinent labs & imaging results that were available during my care of the patient were reviewed by me and considered in my medical decision making (see chart for details).   1.  Left breast abscess Left breast abscess has already drained on its own, however infection remains.  Doxycycline twice daily for the next 7 days sent to pharmacy to be taken as prescribed.  She is to take this with food to avoid stomach upset to treat infected abscess.  Advised to continue warm compresses.  She may use Tylenol every 6 hours as needed for aches and pains associated with abscess.  2. Mild intermittent asthma with  acute exacerbation Symptoms and physical exam consistent with asthma exacerbation likely triggered by viral upper respiratory tract infection that will likely resolve with rest, fluids, and prescriptions for symptomatic relief. Deferred imaging based on stable cardiopulmonary exam and hemodynamically stable vital signs.  Deferred viral testing due to timing of illness.  Doxycycline prescribed above for breast abscess will likely help with any postviral bacterial sinusitis beginning to form.  Albuterol inhaler, prednisone burst for 3 days (she has already had 2 days from leftover prednisone prescription), and Tessalon Perles sent to pharmacy for symptomatic relief to be taken as prescribed.  May continue taking over the  counter medications as directed for further symptomatic relief.  Nonpharmacologic interventions for symptom relief provided and after visit summary below. Advised to push fluids to stay well hydrated while recovering from viral illness.   Discussed physical exam and available lab work findings in clinic with patient.  Counseled patient regarding appropriate use of medications and potential side effects for all medications recommended or prescribed today. Discussed red flag signs and symptoms of worsening condition,when to call the PCP office, return to urgent care, and when to seek higher level of care in the emergency department. Patient verbalizes understanding and agreement with plan. All questions answered. Patient discharged in stable condition.    Final Clinical Impressions(s) / UC Diagnoses   Final diagnoses:  Left breast abscess  Nasal congestion  Mild intermittent asthma with acute exacerbation     Discharge Instructions      You have a viral upper respiratory infection that is likely triggering your asthma.  Use the following medicines to help with symptoms: - Plain Mucinex (guaifenesin) over the counter as directed every 12 hours to thin mucous so that you are able to get it out of your body easier. Drink plenty of water while taking this medication so that it works well in your body (at least 8 cups a day).  - Tylenol 1,'000mg'$  every 6 hours with food as needed for aches/pains or fever/chills.  - Tessalon perles every 8 hours as needed for cough. - Prednisone '40mg'$  once daily for the next 3 days.    Abscess treatment: - Doxycycline antibiotic 2 times a day (with breakfast and with dinner) for the next 7 days to treat infection. -Continue warm compresses to the abscess to allow for further drainage.  If you develop any new or worsening symptoms, please return.  If your symptoms are severe, please go to the emergency room.  Follow-up with your primary care provider for further  evaluation and management of your symptoms as well as ongoing wellness visits.  I hope you feel better!      ED Prescriptions     Medication Sig Dispense Auth. Provider   predniSONE (DELTASONE) 10 MG tablet Take 4 tablets (40 mg total) by mouth daily for 3 days. 12 tablet Joella Prince M, FNP   benzonatate (TESSALON) 100 MG capsule Take 1 capsule (100 mg total) by mouth every 8 (eight) hours. 21 capsule Joella Prince M, FNP   doxycycline (VIBRAMYCIN) 100 MG capsule Take 1 capsule (100 mg total) by mouth 2 (two) times daily for 7 days. 14 capsule Talbot Grumbling, FNP      PDMP not reviewed this encounter.   Talbot Grumbling, [place] 08/04/22 [phone]

## 2022-08-04 NOTE — ED Triage Notes (Addendum)
Patient c/o a productive cough with yellow sputum, nasal congestion, and expiratory wheezing.  Patient also reports that she had an abscess under her left breast that "busted" and it has a "bad odor."  Patient states she has been taking Mucinex, alka-Selza cold medicine and Thera-flu with no relief. Patient also stated that she has been taking "left over Prednisone for 2 days and it didn't help."

## 2022-09-09 ENCOUNTER — Encounter: Payer: Self-pay | Admitting: Advanced Practice Midwife

## 2022-09-09 ENCOUNTER — Ambulatory Visit: Payer: Commercial Managed Care - PPO | Admitting: Advanced Practice Midwife

## 2022-09-09 VITALS — BP 142/99 | HR 79 | Ht 64.0 in | Wt 136.0 lb

## 2022-09-09 DIAGNOSIS — Z1211 Encounter for screening for malignant neoplasm of colon: Secondary | ICD-10-CM

## 2022-09-09 DIAGNOSIS — Z1231 Encounter for screening mammogram for malignant neoplasm of breast: Secondary | ICD-10-CM

## 2022-09-09 NOTE — Progress Notes (Signed)
Pt not seen by provider. Unable to wait and had to reschedule.

## 2022-09-30 ENCOUNTER — Ambulatory Visit: Payer: Commercial Managed Care - PPO | Admitting: Obstetrics and Gynecology

## 2022-10-01 ENCOUNTER — Emergency Department (HOSPITAL_COMMUNITY)
Admission: EM | Admit: 2022-10-01 | Discharge: 2022-10-01 | Disposition: A | Discharge status: Hospice | Payer: Commercial Managed Care - PPO | Attending: Emergency Medicine | Admitting: Emergency Medicine

## 2022-10-01 ENCOUNTER — Other Ambulatory Visit: Payer: Self-pay

## 2022-10-01 DIAGNOSIS — R109 Unspecified abdominal pain: Secondary | ICD-10-CM | POA: Diagnosis not present

## 2022-10-01 DIAGNOSIS — R197 Diarrhea, unspecified: Secondary | ICD-10-CM | POA: Insufficient documentation

## 2022-10-01 DIAGNOSIS — R112 Nausea with vomiting, unspecified: Secondary | ICD-10-CM | POA: Insufficient documentation

## 2022-10-01 DIAGNOSIS — J45909 Unspecified asthma, uncomplicated: Secondary | ICD-10-CM | POA: Insufficient documentation

## 2022-10-01 DIAGNOSIS — I10 Essential (primary) hypertension: Secondary | ICD-10-CM | POA: Diagnosis not present

## 2022-10-01 DIAGNOSIS — Z79899 Other long term (current) drug therapy: Secondary | ICD-10-CM | POA: Insufficient documentation

## 2022-10-01 LAB — COMPREHENSIVE METABOLIC PANEL
ALT: 9 U/L (ref 0–44)
AST: 12 U/L — ABNORMAL LOW (ref 15–41)
Albumin: 3.2 g/dL — ABNORMAL LOW (ref 3.5–5.0)
Alkaline Phosphatase: 51 U/L (ref 38–126)
Anion gap: 11 (ref 5–15)
BUN: 7 mg/dL (ref 6–20)
CO2: 23 mmol/L (ref 22–32)
Calcium: 8.4 mg/dL — ABNORMAL LOW (ref 8.9–10.3)
Chloride: 104 mmol/L (ref 98–111)
Creatinine, Ser: 0.84 mg/dL (ref 0.44–1.00)
GFR, Estimated: >60 mL/min (ref >60)
Glucose, Bld: 95 mg/dL (ref 70–99)
Potassium: 3.6 mmol/L (ref 3.5–5.1)
Sodium: 138 mmol/L (ref 135–145)
Total Bilirubin: 0.2 mg/dL — ABNORMAL LOW (ref 0.3–1.2)
Total Protein: 6.3 g/dL — ABNORMAL LOW (ref 6.5–8.1)

## 2022-10-01 LAB — CBC
HCT: 34 % — ABNORMAL LOW (ref 36.0–46.0)
Hemoglobin: 11.4 g/dL — ABNORMAL LOW (ref 12.0–15.0)
MCH: 23.1 pg — ABNORMAL LOW (ref 26.0–34.0)
MCHC: 33.5 g/dL (ref 30.0–36.0)
MCV: 68.8 fL — ABNORMAL LOW (ref 80.0–100.0)
Platelets: 271 10*3/uL (ref 150–400)
RBC: 4.94 MIL/uL (ref 3.87–5.11)
RDW: 15.9 % — ABNORMAL HIGH (ref 11.5–15.5)
WBC: 4.4 10*3/uL (ref 4.0–10.5)
nRBC: 0 % (ref 0.0–0.2)

## 2022-10-01 LAB — LIPASE, BLOOD: Lipase: 29 U/L (ref 11–51)

## 2022-10-01 MED ORDER — ONDANSETRON HCL 4 MG/2ML IJ SOLN
4.0000 mg | Freq: Once | INTRAMUSCULAR | Status: AC
Start: 2022-10-01 — End: 2022-10-01
  Administered 2022-10-01: 4 mg via INTRAVENOUS
  Filled 2022-10-01: qty 2

## 2022-10-01 MED ORDER — FENTANYL CITRATE PF 50 MCG/ML IJ SOSY
50.0000 ug | PREFILLED_SYRINGE | Freq: Once | INTRAMUSCULAR | Status: AC
  Administered 2022-10-01: 50 ug via INTRAVENOUS
  Filled 2022-10-01: qty 1

## 2022-10-01 MED ORDER — PROCHLORPERAZINE EDISYLATE 10 MG/2ML IJ SOLN
10.0000 mg | Freq: Once | INTRAMUSCULAR | Status: AC
  Administered 2022-10-01: 10 mg via INTRAVENOUS
  Filled 2022-10-01: qty 2

## 2022-10-01 MED ORDER — ONDANSETRON 4 MG PO TBDP: 4.0000 mg | ORAL_TABLET | Freq: Three times a day (TID) | ORAL | 0 refills | Status: DC | PRN

## 2022-10-01 MED ORDER — SODIUM CHLORIDE 0.9 % IV BOLUS
1000.0000 mL | Freq: Once | INTRAVENOUS | Status: AC
  Administered 2022-10-01: 1000 mL via INTRAVENOUS

## 2022-10-01 MED ORDER — KETOROLAC TROMETHAMINE 15 MG/ML IJ SOLN
15.0000 mg | Freq: Once | INTRAMUSCULAR | Status: AC
Start: 2022-10-01 — End: 2022-10-01
  Administered 2022-10-01: 15 mg via INTRAVENOUS
  Filled 2022-10-01: qty 1

## 2022-10-01 NOTE — ED Notes (Signed)
Lab at bedside to obtain blood that was "clotted" in lab

## 2022-10-01 NOTE — ED Provider Notes (Signed)
[facility] Provider Note   CSN: [id number] Arrival date & time: 10/01/22  0755     History  Chief Complaint  Patient presents with   Abdominal Pain    Carol Mack is a 52 y.o. female.   Abdominal Pain Patient presents with nausea vomiting diarrhea.  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  Also abdominal pain.  States been vomiting and yesterday.  States she is taking some diarrhea medicine which is slowing down some.  States that when she has pain she will take pain medicine that starts with a D.  No fevers or chills.  Mild dysuria.  Denies pregnancy.    Past Medical History:  Diagnosis Date   Asthma    Bartholin cyst    Chiari malformation type I ([place])    Hernia, inguinal    Hernia, inguinal    Hypertension     Home Medications Prior to Admission medications   Medication Sig Start Date End Date Taking? Authorizing Provider  ondansetron (ZOFRAN-ODT) 4 MG disintegrating tablet Take 1 tablet (4 mg total) by mouth every 8 (eight) hours as needed for nausea or vomiting. 10/01/22  Yes Davonna Belling, MD  albuterol (VENTOLIN HFA) 108 (90 Base) MCG/ACT inhaler Inhale 1-2 puffs into the lungs every 6 (six) hours as needed for shortness of breath. 09/27/20   Tacy Learn, PA-C  amLODipine (NORVASC) 5 MG tablet Take 1 tablet (5 mg total) by mouth daily. 02/13/22   Vanessa Kick, MD  Multiple Vitamin (MULTIVITAMIN) tablet Take 1 tablet by mouth daily.    [provider]      Allergies    Hydrocodone and Percocet [oxycodone-acetaminophen]    Review of Systems   Review of Systems  Gastrointestinal:  Positive for abdominal pain.    Physical Exam Updated Vital Signs BP (!) 156/72   Pulse 66   Temp 98.4 F (36.9 C)   Resp 18   LMP  (LMP Unknown)   SpO2 98%  Physical Exam Vitals and nursing note reviewed.  Cardiovascular:     Rate and Rhythm: Normal rate and  regular rhythm.  Abdominal:     Comments: Minimal diffuse abdominal tenderness.  No rebound or guarding.  No hernia palpated.  Skin:    General: Skin is warm.     Capillary Refill: Capillary refill takes less than 2 seconds.  Neurological:     Mental Status: She is alert.     ED Results / Procedures / Treatments   Labs (all labs ordered are listed, but only abnormal results are displayed) Labs Reviewed  CBC - Abnormal; Notable for the following components:      Result Value   Hemoglobin 11.4 (*)    HCT 34.0 (*)    MCV 68.8 (*)    MCH 23.1 (*)    RDW 15.9 (*)    All other components within normal limits  COMPREHENSIVE METABOLIC PANEL - Abnormal; Notable for the following components:   Calcium 8.4 (*)    Total Protein 6.3 (*)    Albumin 3.2 (*)    AST 12 (*)    Total Bilirubin 0.2 (*)    All other components within normal limits  LIPASE, BLOOD  URINALYSIS, W/ REFLEX TO CULTURE (INFECTION SUSPECTED)    EKG None  Radiology No results found.  Procedures Procedures    Medications Ordered in ED Medications  sodium chloride 0.9 % bolus 1,000 mL (0 mLs  Intravenous Stopped 10/01/22 1022)  ondansetron (ZOFRAN) injection 4 mg (4 mg Intravenous Given 10/01/22 0845)  fentaNYL (SUBLIMAZE) injection 50 mcg (50 mcg Intravenous Given 10/01/22 0926)  ketorolac (TORADOL) 15 MG/ML injection 15 mg (15 mg Intravenous Given 10/01/22 1041)  prochlorperazine (COMPAZINE) injection 10 mg (10 mg Intravenous Given 10/01/22 1041)    ED Course/ Medical Decision Making/ A&P                             Medical Decision Making Amount and/or Complexity of Data Reviewed Labs: ordered.  Risk Prescription drug management.   Patient with nausea vomiting diarrhea.  Abdominal pain.  Differential diagnosis includes gastroenteritis, other intra-abdominal infections, food poisoning.  Will get basic blood work and give saline bolus antiemetics and small dose of pain medicine.  Patient had increase  her headache.  States nausea improved.  Given Compazine and and Toradol felt much better.  Tolerating orals.  Discharge home with antiemetics.  Blood work reviewed and reassuring.  Discharge home.        Final Clinical Impression(s) / ED Diagnoses Final diagnoses:  Nausea vomiting and diarrhea    Rx / DC Orders ED Discharge Orders          Ordered    ondansetron (ZOFRAN-ODT) 4 MG disintegrating tablet  Every 8 hours PRN        10/01/22 1126              Davonna Belling, MD 10/01/22 1137

## 2022-10-01 NOTE — ED Triage Notes (Signed)
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

## 2022-11-04 ENCOUNTER — Ambulatory Visit: Payer: Commercial Managed Care - PPO

## 2022-12-30 ENCOUNTER — Other Ambulatory Visit (HOSPITAL_COMMUNITY)
Admission: RE | Admit: 2022-12-30 | Discharge: 2022-12-30 | Disposition: A | Discharge status: Home / Self Care | Payer: Commercial Managed Care - PPO | Source: Ambulatory Visit | Attending: Obstetrics and Gynecology | Admitting: Obstetrics and Gynecology

## 2022-12-30 ENCOUNTER — Encounter: Payer: Self-pay | Admitting: Obstetrics and Gynecology

## 2022-12-30 ENCOUNTER — Ambulatory Visit: Payer: Commercial Managed Care - PPO | Admitting: Obstetrics and Gynecology

## 2022-12-30 ENCOUNTER — Other Ambulatory Visit: Payer: Self-pay

## 2022-12-30 VITALS — BP 153/96 | HR 84 | Ht 64.0 in | Wt 129.3 lb

## 2022-12-30 DIAGNOSIS — Z01419 Encounter for gynecological examination (general) (routine) without abnormal findings: Secondary | ICD-10-CM | POA: Insufficient documentation

## 2022-12-30 DIAGNOSIS — Z113 Encounter for screening for infections with a predominantly sexual mode of transmission: Secondary | ICD-10-CM | POA: Insufficient documentation

## 2022-12-30 DIAGNOSIS — B3731 Acute candidiasis of vulva and vagina: Secondary | ICD-10-CM

## 2022-12-30 DIAGNOSIS — R634 Abnormal weight loss: Secondary | ICD-10-CM

## 2022-12-30 DIAGNOSIS — I10 Essential (primary) hypertension: Secondary | ICD-10-CM | POA: Diagnosis not present

## 2022-12-30 DIAGNOSIS — N898 Other specified noninflammatory disorders of vagina: Secondary | ICD-10-CM

## 2022-12-30 DIAGNOSIS — N760 Acute vaginitis: Secondary | ICD-10-CM

## 2022-12-30 DIAGNOSIS — B9689 Other specified bacterial agents as the cause of diseases classified elsewhere: Secondary | ICD-10-CM

## 2022-12-30 MED ORDER — METRONIDAZOLE 0.75 % VA GEL: 1.0000 | Freq: Every day | VAGINAL | 1 refills | Status: DC

## 2022-12-30 MED ORDER — ALBUTEROL SULFATE HFA 108 (90 BASE) MCG/ACT IN AERS: 1.0000 | INHALATION_SPRAY | Freq: Four times a day (QID) | RESPIRATORY_TRACT | 1 refills | Status: DC | PRN

## 2022-12-30 MED ORDER — AMLODIPINE BESYLATE 5 MG PO TABS: 5.0000 mg | ORAL_TABLET | Freq: Every day | ORAL | 1 refills | Status: DC

## 2022-12-30 NOTE — Patient Instructions (Signed)
Over the counter medication: Replens

## 2022-12-30 NOTE — Progress Notes (Signed)
ANNUAL VISIT:  STD screening:  Pap smear: Total Hysterectomy   Schedule mammo:  Today's Concerns: Patient informed me that she's been having abnormal vaginal odor for about 2 weeks now. Denies any discharge.

## 2022-12-30 NOTE — Progress Notes (Signed)
ANNUAL EXAM Patient name: Carol Mack MRN [id number]  Date of birth: 1970-10-24 Chief Complaint:   No chief complaint on file.  History of Present Illness:   Carol Mack is a 52 y.o. G63P2002 female being seen today for a routine annual exam.   Current complaints: Vaginal odor for 2 weeks. Thinks recurrent BV.   BP elevated today but did not yet take her amlodipine. She needs a refill on this and needs one for albuterol. Notes unexplained weight loss - had issues with thyroid in the past being overactive. Reports tobacco use as well. Has not had colonoscopy.   No LMP recorded (lmp unknown). Patient has had a hysterectomy.  Last MXR: Due to be scheduled Last Pap/Pap History: s/p hyst ([facility] due to fibroids)   Health Maintenance Due  Topic Date Due   Colonoscopy  Never done   Zoster Vaccines- Shingrix (1 of 2) Never done   MAMMOGRAM  08/13/2020   PAP SMEAR-Modifier  08/07/2021   COVID-19 Vaccine (2 - 2023-24 season) 03/15/2022    Review of Systems:   Pertinent items are noted in HPI Denies any headaches, blurred vision, fatigue, shortness of breath, chest pain, abdominal pain, problems with periods, bowel movements, urination, or intercourse unless otherwise stated above.  Pertinent History Reviewed:  Reviewed past medical,surgical, social and family history.  Reviewed problem list, medications and allergies. Physical Assessment:   Vitals:   12/30/22 1435  BP: (!) 153/96  Pulse: 84  Weight: 129 lb 4.8 oz (58.7 kg)  Height: 5\' 4"  (1.626 m)  Body mass index is 22.19 kg/m.   Physical Examination:  General appearance - well appearing, and in no distress Mental status - alert, oriented to person, place, and time Psych:  She has a normal mood and affect Skin - warm and dry, normal color, no suspicious lesions noted Chest - effort normal Heart - normal rate  Breasts - Declines Abdomen - soft, nontender, nondistended, no masses or organomegaly Pelvic -  Declines Extremities:  No swelling or varicosities noted  Chaperone present for exam  No results found for this or any previous visit (from the past 24 hour(s)).  Assessment & Plan:  Diagnoses and all orders for this visit:  Well woman exam - Cervical cancer screening: Discussed guidelines. No longer indicated - hyst for benign reasons.  - Breast Health: Encouraged self breast awareness/SBE. Discussed limits of clinical breast exam for detecting breast cancer. Discussed importance of annual MXR. Rx given for MXR - Climacteric/Sexual health: Reviewed typical and atypical symptoms of menopause/peri-menopause. Discussed PMB and to call if any amount of spotting.  - Colonoscopy:  Referred to GI - F/U 12 months and prn -     Ambulatory referral to Gastroenterology  Vaginal odor - Has historically done both pills and gel.  - Plan is if BV we will do suppression following treatment along with replens as an option. If negative for BV, we discussed replens.  -     Cervicovaginal ancillary only( [place]) -     metroNIDAZOLE (METROGEL) 0.75 % vaginal gel; Place 1 Applicatorful vaginally at bedtime. Apply one applicatorful to vagina at bedtime for 5 days  Routine screening for STI (sexually transmitted infection) -     Cervicovaginal ancillary only( [place]) -     HIV Antibody (routine testing w rflx) -     RPR -     Hepatitis C Antibody -     Hepatitis B Surface AntiGEN   Chronic hypertension -  Ambulatory referral to [facility]  Essential hypertension -     amLODipine (NORVASC) 5 MG tablet; Take 1 tablet (5 mg total) by mouth daily.  Weight loss - If normal, would recommend further evaluation with PCP -     TSH Rfx on Abnormal to Free T4  Other orders -     albuterol (VENTOLIN HFA) 108 (90 Base) MCG/ACT inhaler; Inhale 1-2 puffs into the lungs every 6 (six) hours as needed for shortness of breath.    Orders Placed This Encounter  Procedures   HIV Antibody  (routine testing w rflx)   RPR   Hepatitis C Antibody   Hepatitis B Surface AntiGEN   TSH Rfx on Abnormal to Free T4   Ambulatory referral to [facility]   Ambulatory referral to Gastroenterology    Meds:  Meds ordered this encounter  Medications   amLODipine (NORVASC) 5 MG tablet    Sig: Take 1 tablet (5 mg total) by mouth daily.    Dispense:  30 tablet    Refill:  1   albuterol (VENTOLIN HFA) 108 (90 Base) MCG/ACT inhaler    Sig: Inhale 1-2 puffs into the lungs every 6 (six) hours as needed for shortness of breath.    Dispense:  2 each    Refill:  1   metroNIDAZOLE (METROGEL) 0.75 % vaginal gel    Sig: Place 1 Applicatorful vaginally at bedtime. Apply one applicatorful to vagina at bedtime for 5 days    Dispense:  70 g    Refill:  1    Follow-up: Return in about 1 year (around 12/30/2023) for annual.  Milas Hock, MD 12/30/2022 3:48 PM

## 2022-12-31 LAB — HEPATITIS C ANTIBODY: Hep C Virus Ab: NONREACTIVE

## 2022-12-31 LAB — RPR: RPR Ser Ql: NONREACTIVE

## 2022-12-31 LAB — CERVICOVAGINAL ANCILLARY ONLY
Bacterial Vaginitis (gardnerella): POSITIVE — AB
Candida Glabrata: NEGATIVE
Candida Vaginitis: POSITIVE — AB
Chlamydia: NEGATIVE
Comment: NEGATIVE
Comment: NEGATIVE
Comment: NEGATIVE
Comment: NEGATIVE
Comment: NEGATIVE
Comment: NORMAL
Neisseria Gonorrhea: NEGATIVE
Trichomonas: NEGATIVE

## 2022-12-31 LAB — TSH RFX ON ABNORMAL TO FREE T4: TSH: 1.14 u[IU]/mL (ref 0.450–4.500)

## 2022-12-31 LAB — HEPATITIS B SURFACE ANTIGEN: Hepatitis B Surface Ag: NEGATIVE

## 2022-12-31 LAB — HIV ANTIBODY (ROUTINE TESTING W REFLEX): HIV Screen 4th Generation wRfx: NONREACTIVE

## 2022-12-31 MED ORDER — METRONIDAZOLE 0.75 % VA GEL
1.0000 | VAGINAL | 3 refills | Status: DC
Start: 2022-12-31 — End: 2023-05-08

## 2022-12-31 MED ORDER — FLUCONAZOLE 150 MG PO TABS: 150.0000 mg | ORAL_TABLET | ORAL | 3 refills | Status: DC

## 2022-12-31 NOTE — Addendum Note (Signed)
Addended by: Milas Hock A on: 12/31/2022 02:27 PM   Modules accepted: Orders

## 2023-01-01 ENCOUNTER — Telehealth: Payer: Commercial Managed Care - PPO | Admitting: Nurse Practitioner

## 2023-01-01 DIAGNOSIS — Z716 Tobacco abuse counseling: Secondary | ICD-10-CM

## 2023-01-01 DIAGNOSIS — J40 Bronchitis, not specified as acute or chronic: Secondary | ICD-10-CM

## 2023-01-01 MED ORDER — AZITHROMYCIN 250 MG PO TABS
ORAL_TABLET | ORAL | 0 refills | Status: AC
Start: 2023-01-01 — End: 2023-01-06

## 2023-01-01 MED ORDER — BENZONATATE 100 MG PO CAPS
100.0000 mg | ORAL_CAPSULE | Freq: Three times a day (TID) | ORAL | 0 refills | Status: DC | PRN
Start: 2023-01-01 — End: 2023-05-08

## 2023-01-01 MED ORDER — NICOTINE POLACRILEX 4 MG MT GUM
4.0000 mg | CHEWING_GUM | OROMUCOSAL | 0 refills | Status: DC | PRN
Start: 2023-01-01 — End: 2023-05-08

## 2023-01-01 NOTE — Progress Notes (Signed)
Virtual Visit Consent   Carol Mack, you are scheduled for a virtual visit with a [facility] provider today. Just as with appointments in the office, your consent must be obtained to participate. Your consent will be active for this visit and any virtual visit you may have with one of our providers in the next 365 days. If you have a MyChart account, a copy of this consent can be sent to you electronically.  As this is a virtual visit, video technology does not allow for your provider to perform a traditional examination. This may limit your provider's ability to fully assess your condition. If your provider identifies any concerns that need to be evaluated in person or the need to arrange testing (such as labs, EKG, etc.), we will make arrangements to do so. Although advances in technology are sophisticated, we cannot ensure that it will always work on either your end or our end. If the connection with a video visit is poor, the visit may have to be switched to a telephone visit. With either a video or telephone visit, we are not always able to ensure that we have a secure connection.  By engaging in this virtual visit, you consent to the provision of healthcare and authorize for your insurance to be billed (if applicable) for the services provided during this visit. Depending on your insurance coverage, you may receive a charge related to this service.  I need to obtain your verbal consent now. Are you willing to proceed with your visit today? Carol Mack has provided verbal consent on 01/01/2023 for a virtual visit (video or telephone). Viviano Simas, FNP  Date: 01/01/2023 2:01 PM  Virtual Visit via Video Note   I, Viviano Simas, connected with  Carol Mack  ([id number], 01-09-1971) on 01/01/23 at  2:00 PM EDT by a video-enabled telemedicine application and verified that I am speaking with the correct person using two identifiers.  Location: Patient: Virtual Visit Location Patient:  Home Provider: Virtual Visit Location Provider: Home Office   I discussed the limitations of evaluation and management by telemedicine and the availability of in person appointments. The patient expressed understanding and agreed to proceed.    History of Present Illness: Carol Mack is a 52 y.o. who identifies as a female who was assigned female at birth, and is being seen today for productive cough and nasal congestion  She had an appointment yesterday but forgot to mention this to her doctor   She has been taking Claritin and Mucinex for relief  She has had symptoms for 2 weeks   Feels it more in chest now  Denies any fevers   She has used her inhaler since being sick as well   She has been trying to quite, tried the patch in the past but felt it was too strong   Problems:  Patient Active Problem List   Diagnosis Date Noted   S/P laparoscopic hysterectomy 09/15/2018   Left breast lump 08/07/2018   Essential hypertension 05/28/2018   Cigarette nicotine dependence without complication 05/28/2018   Multinodular goiter 01/19/2016   Hyperthyroidism 09/25/2015    Allergies:  Allergies  Allergen Reactions   Hydrocodone Itching   Percocet [Oxycodone-Acetaminophen] Other (See Comments)    jittery   Medications:  Current Outpatient Medications:    albuterol (VENTOLIN HFA) 108 (90 Base) MCG/ACT inhaler, Inhale 1-2 puffs into the lungs every 6 (six) hours as needed for shortness of breath., Disp: 2 each, Rfl: 1   amLODipine (NORVASC) 5 MG  tablet, Take 1 tablet (5 mg total) by mouth daily., Disp: 30 tablet, Rfl: 1   fluconazole (DIFLUCAN) 150 MG tablet, Take 1 tablet (150 mg total) by mouth every 3 (three) days. For three doses, Disp: 3 tablet, Rfl: 3   metroNIDAZOLE (METROGEL) 0.75 % vaginal gel, Place 1 Applicatorful vaginally at bedtime. Apply one applicatorful to vagina at bedtime for 5 days, Disp: 70 g, Rfl: 1   metroNIDAZOLE (METROGEL) 0.75 % vaginal gel, Place 1  Applicatorful vaginally once a week. Weekly for 6 months., Disp: 70 g, Rfl: 3   Multiple Vitamin (MULTIVITAMIN) tablet, Take 1 tablet by mouth daily., Disp: , Rfl:   Observations/Objective: Patient is well-developed, well-nourished in no acute distress.  Resting comfortably  at home.  Head is normocephalic, atraumatic.  No labored breathing.  Speech is clear and coherent with logical content.  Patient is alert and oriented at baseline.    Assessment and Plan: 1. Bronchitis  - azithromycin (ZITHROMAX) 250 MG tablet; Take 2 tablets on day 1, then 1 tablet daily on days 2 through 5  Dispense: 6 tablet; Refill: 0 - benzonatate (TESSALON) 100 MG capsule; Take 1 capsule (100 mg total) by mouth 3 (three) times daily as needed.  Dispense: 30 capsule; Refill: 0  2. Encounter for smoking cessation counseling  - nicotine polacrilex (NICORETTE) 4 MG gum; Take 1 each (4 mg total) by mouth as needed for smoking cessation.  Dispense: 100 tablet; Refill: 0     Follow Up Instructions: I discussed the assessment and treatment plan with the patient. The patient was provided an opportunity to ask questions and all were answered. The patient agreed with the plan and demonstrated an understanding of the instructions.  A copy of instructions were sent to the patient via MyChart unless otherwise noted below.    The patient was advised to call back or seek an in-person evaluation if the symptoms worsen or if the condition fails to improve as anticipated.  Time:  I spent 20 minutes with the patient via telehealth technology discussing the above problems/concerns.    Viviano Simas, FNP

## 2023-01-17 ENCOUNTER — Ambulatory Visit: Payer: Commercial Managed Care - PPO

## 2023-01-31 ENCOUNTER — Ambulatory Visit: Payer: Commercial Managed Care - PPO

## 2023-02-05 ENCOUNTER — Encounter: Payer: Self-pay | Admitting: Obstetrics and Gynecology

## 2023-02-11 ENCOUNTER — Telehealth: Payer: Self-pay | Admitting: Family Medicine

## 2023-02-11 NOTE — Telephone Encounter (Signed)
Patient would like to know where we had referred her previously

## 2023-02-12 NOTE — Telephone Encounter (Signed)
Called patient, no answer- left message stating we are trying to reach you to return your phone call, please call us back. Will send mychart message

## 2023-03-30 ENCOUNTER — Emergency Department (HOSPITAL_COMMUNITY)
Admission: EM | Admit: 2023-03-30 | Discharge: 2023-03-30 | Disposition: A | Discharge status: Home / Self Care | Payer: Commercial Managed Care - PPO | Attending: Emergency Medicine | Admitting: Emergency Medicine

## 2023-03-30 ENCOUNTER — Other Ambulatory Visit: Payer: Self-pay

## 2023-03-30 ENCOUNTER — Encounter (HOSPITAL_COMMUNITY): Payer: Self-pay | Admitting: Emergency Medicine

## 2023-03-30 ENCOUNTER — Emergency Department (HOSPITAL_COMMUNITY): Payer: Commercial Managed Care - PPO

## 2023-03-30 DIAGNOSIS — I10 Essential (primary) hypertension: Secondary | ICD-10-CM | POA: Diagnosis not present

## 2023-03-30 DIAGNOSIS — K6389 Other specified diseases of intestine: Secondary | ICD-10-CM | POA: Diagnosis not present

## 2023-03-30 DIAGNOSIS — J45909 Unspecified asthma, uncomplicated: Secondary | ICD-10-CM | POA: Diagnosis not present

## 2023-03-30 DIAGNOSIS — R1032 Left lower quadrant pain: Secondary | ICD-10-CM | POA: Insufficient documentation

## 2023-03-30 DIAGNOSIS — R112 Nausea with vomiting, unspecified: Secondary | ICD-10-CM | POA: Diagnosis not present

## 2023-03-30 DIAGNOSIS — Z79899 Other long term (current) drug therapy: Secondary | ICD-10-CM | POA: Insufficient documentation

## 2023-03-30 LAB — COMPREHENSIVE METABOLIC PANEL
ALT: 14 U/L (ref 0–44)
AST: 16 U/L (ref 15–41)
Albumin: 3.4 g/dL — ABNORMAL LOW (ref 3.5–5.0)
Alkaline Phosphatase: 58 U/L (ref 38–126)
Anion gap: 7 (ref 5–15)
BUN: 7 mg/dL (ref 6–20)
CO2: 24 mmol/L (ref 22–32)
Calcium: 8.9 mg/dL (ref 8.9–10.3)
Chloride: 106 mmol/L (ref 98–111)
Creatinine, Ser: 0.74 mg/dL (ref 0.44–1.00)
GFR, Estimated: >60 mL/min (ref >60)
Glucose, Bld: 111 mg/dL — ABNORMAL HIGH (ref 70–99)
Potassium: 3.8 mmol/L (ref 3.5–5.1)
Sodium: 137 mmol/L (ref 135–145)
Total Bilirubin: 0.5 mg/dL (ref 0.3–1.2)
Total Protein: 7.2 g/dL (ref 6.5–8.1)

## 2023-03-30 LAB — URINALYSIS, ROUTINE W REFLEX MICROSCOPIC
Bilirubin Urine: NEGATIVE
Glucose, UA: NEGATIVE mg/dL
Hgb urine dipstick: NEGATIVE
Ketones, ur: NEGATIVE mg/dL
Leukocytes,Ua: NEGATIVE
Nitrite: NEGATIVE
Protein, ur: NEGATIVE mg/dL
Specific Gravity, Urine: 1.02 (ref 1.005–1.030)
pH: 7 (ref 5.0–8.0)

## 2023-03-30 LAB — LIPASE, BLOOD: Lipase: 22 U/L (ref 11–51)

## 2023-03-30 LAB — CBC
HCT: 35.7 % — ABNORMAL LOW (ref 36.0–46.0)
Hemoglobin: 11.8 g/dL — ABNORMAL LOW (ref 12.0–15.0)
MCH: 21 pg — ABNORMAL LOW (ref 26.0–34.0)
MCHC: 33.1 g/dL (ref 30.0–36.0)
MCV: 63.5 fL — ABNORMAL LOW (ref 80.0–100.0)
Platelets: 317 10*3/uL (ref 150–400)
RBC: 5.62 MIL/uL — ABNORMAL HIGH (ref 3.87–5.11)
RDW: 18.6 % — ABNORMAL HIGH (ref 11.5–15.5)
WBC: 7.7 10*3/uL (ref 4.0–10.5)
nRBC: 0 % (ref 0.0–0.2)

## 2023-03-30 MED ORDER — ONDANSETRON HCL 4 MG/2ML IJ SOLN
4.0000 mg | Freq: Once | INTRAMUSCULAR | Status: AC
  Administered 2023-03-30: 4 mg via INTRAVENOUS
  Filled 2023-03-30: qty 2

## 2023-03-30 MED ORDER — LACTATED RINGERS IV BOLUS
1000.0000 mL | Freq: Once | INTRAVENOUS | Status: AC
  Administered 2023-03-30: 1000 mL via INTRAVENOUS

## 2023-03-30 MED ORDER — MORPHINE SULFATE (PF) 4 MG/ML IV SOLN
4.0000 mg | Freq: Once | INTRAVENOUS | Status: DC
  Filled 2023-03-30: qty 1

## 2023-03-30 MED ORDER — OXYCODONE HCL 5 MG PO TABS
5.0000 mg | ORAL_TABLET | Freq: Four times a day (QID) | ORAL | 0 refills | Status: AC | PRN
Start: 2023-03-30 — End: 2023-04-04

## 2023-03-30 MED ORDER — IOHEXOL 350 MG/ML SOLN
75.0000 mL | Freq: Once | INTRAVENOUS | Status: AC | PRN
  Administered 2023-03-30: 75 mL via INTRAVENOUS

## 2023-03-30 MED ORDER — SODIUM CHLORIDE 0.9 % IV SOLN
25.0000 mg | Freq: Once | INTRAVENOUS | Status: AC
  Administered 2023-03-30: 25 mg via INTRAVENOUS
  Filled 2023-03-30: qty 1

## 2023-03-30 MED ORDER — ONDANSETRON HCL 4 MG PO TABS
4.0000 mg | ORAL_TABLET | Freq: Three times a day (TID) | ORAL | 0 refills | Status: AC | PRN
Start: 2023-03-30 — End: 2023-04-06

## 2023-03-30 MED ORDER — HYDROMORPHONE HCL 1 MG/ML IJ SOLN
0.5000 mg | Freq: Once | INTRAMUSCULAR | Status: AC
  Administered 2023-03-30: 0.5 mg via INTRAVENOUS
  Filled 2023-03-30: qty 1

## 2023-03-30 MED ORDER — KETOROLAC TROMETHAMINE 15 MG/ML IJ SOLN
15.0000 mg | Freq: Once | INTRAMUSCULAR | Status: AC
  Administered 2023-03-30: 15 mg via INTRAVENOUS
  Filled 2023-03-30: qty 1

## 2023-03-30 NOTE — Discharge Instructions (Addendum)
Thank you for letting us take care of you today.  Your CT showed an area in your colon suspicious for colon cancer. It is very important that you follow up with GI for a colonoscopy to confirm this diagnosis.  I have referred you to GI.  They should call you this week to set up this appointment.  If you do not hear from them, call their office to schedule a follow-up appointment and tell them that you are seen in the ER and had a CT concerning for colon cancer.  We did not see any other significant changes on your CT or labs today.  I am sending you home with nausea medication.  It looks like you have previously had allergic reaction to hydrocodone but were able to tolerate oxycodone but did have some side effects.  I recommend keeping Benadryl on hand in case you develop any side effects from your pain medication.  I recommend taking Tylenol or ibuprofen as first-line.  You may take up to 1000 mg Tylenol or 600 mg ibuprofen every 6 hours as needed.  Do not take more than 4000 mg Tylenol or 3200 mg ibuprofen in 24 hours.  If this does not work for your pain, you may take the oxycodone as prescribed as needed for severe pain.  I provided 2 primary care clinics that you may follow-up with.  I recommend establishing primary care as you are going to need a good outpatient care team while undergoing further workup of this mass.  You may follow-up with either of these clinics or PCP of your own choosing.  For new or worsening symptoms including uncontrollable vomiting or pain, fevers, inability to have a bowel movement, or other new concerns return to the nearest ED for reevaluation.

## 2023-03-30 NOTE — ED Provider Notes (Signed)
[facility] AT [facility] Provider Note   CSN: [id number] Arrival date & time: 03/30/23  1027     History  Chief Complaint  Patient presents with   Abdominal Pain    Carol Mack is a 52 y.o. female with past medical history asthma, hypertension who presents to the ED complaining of mid-left abdominal pain and flank pain that started yesterday.  Reports that pain is intermittent and severe in nature.  Pain radiates to the back.  Last bowel movement was last night and was normal.  No recent diarrhea or constipation.  Pain has become so severe she has had an innumerable amount of episodes of nonbloody emesis.  Denies fever at home.  Denies urinary symptoms, vaginal bleeding or discharge, or pelvic pain.  Denies history of similar symptoms.       Home Medications Prior to Admission medications   Medication Sig Start Date End Date Taking? Authorizing Provider  ondansetron (ZOFRAN) 4 MG tablet Take 1 tablet (4 mg total) by mouth every 8 (eight) hours as needed for up to 7 days for nausea or vomiting. 03/30/23 04/06/23 Yes Julieanne Hadsall L, PA-C  oxyCODONE (ROXICODONE) 5 MG immediate release tablet Take 1 tablet (5 mg total) by mouth every 6 (six) hours as needed for up to 5 days for severe pain or breakthrough pain. 03/30/23 04/04/23 Yes Aryelle Figg L, PA-C  albuterol (VENTOLIN HFA) 108 (90 Base) MCG/ACT inhaler Inhale 1-2 puffs into the lungs every 6 (six) hours as needed for shortness of breath. 12/30/22   Milas Hock, MD  amLODipine (NORVASC) 5 MG tablet Take 1 tablet (5 mg total) by mouth daily. 12/30/22   Milas Hock, MD  benzonatate (TESSALON) 100 MG capsule Take 1 capsule (100 mg total) by mouth 3 (three) times daily as needed. 01/01/23   Viviano Simas, FNP  fluconazole (DIFLUCAN) 150 MG tablet Take 1 tablet (150 mg total) by mouth every 3 (three) days. For three doses 12/31/22   Milas Hock, MD  metroNIDAZOLE (METROGEL) 0.75 % vaginal gel Place 1  Applicatorful vaginally at bedtime. Apply one applicatorful to vagina at bedtime for 5 days 12/30/22   Milas Hock, MD  metroNIDAZOLE (METROGEL) 0.75 % vaginal gel Place 1 Applicatorful vaginally once a week. Weekly for 6 months. 12/31/22   Milas Hock, MD  Multiple Vitamin (MULTIVITAMIN) tablet Take 1 tablet by mouth daily.    [provider]  nicotine polacrilex (NICORETTE) 4 MG gum Take 1 each (4 mg total) by mouth as needed for smoking cessation. 01/01/23   Viviano Simas, FNP      Allergies    Hydrocodone and Percocet [oxycodone-acetaminophen]    Review of Systems   Review of Systems  All other systems reviewed and are negative.   Physical Exam Updated Vital Signs BP (!) 161/96   Pulse 71   Temp 98.7 F (37.1 C) (Oral)   Resp (!) 24   Ht 5\' 4"  (1.626 m)   Wt 62.6 kg   LMP  (LMP Unknown)   SpO2 96%   BMI 23.69 kg/m  Physical Exam Vitals and nursing note reviewed.  Constitutional:      General: She is not in acute distress.    Appearance: Normal appearance.  HENT:     Head: Normocephalic and atraumatic.     Mouth/Throat:     Comments: Moderately dry mucous membranes Eyes:     Conjunctiva/sclera: Conjunctivae normal.  Cardiovascular:     Rate and Rhythm: Normal rate and regular  rhythm.     Heart sounds: No murmur heard. Pulmonary:     Effort: Pulmonary effort is normal.     Breath sounds: Normal breath sounds.  Abdominal:     General: Abdomen is flat.     Palpations: Abdomen is soft.     Tenderness: There is abdominal tenderness (L) in the periumbilical area. There is no right CVA tenderness or left CVA tenderness. Negative signs include Murphy's sign, Rovsing's sign and McBurney's sign.  Musculoskeletal:        General: Normal range of motion.     Cervical back: Neck supple.     Right lower leg: No edema.     Left lower leg: No edema.  Skin:    General: Skin is warm and dry.     Capillary Refill: Capillary refill takes less than 2 seconds.   Neurological:     Mental Status: She is alert. Mental status is at baseline.  Psychiatric:        Behavior: Behavior normal.     ED Results / Procedures / Treatments   Labs (all labs ordered are listed, but only abnormal results are displayed) Labs Reviewed  COMPREHENSIVE METABOLIC PANEL - Abnormal; Notable for the following components:      Result Value   Glucose, Bld 111 (*)    Albumin 3.4 (*)    All other components within normal limits  CBC - Abnormal; Notable for the following components:   RBC 5.62 (*)    Hemoglobin 11.8 (*)    HCT 35.7 (*)    MCV 63.5 (*)    MCH 21.0 (*)    RDW 18.6 (*)    All other components within normal limits  LIPASE, BLOOD  URINALYSIS, ROUTINE W REFLEX MICROSCOPIC    EKG EKG Interpretation Date/Time:  Sunday March 30 2023 09:25:25 EDT Ventricular Rate:  70 PR Interval:  174 QRS Duration:  90 QT Interval:  474 QTC Calculation: 512 R Axis:   62  Text Interpretation: Sinus or ectopic atrial rhythm Nonspecific T abnrm, anterolateral leads Prolonged QT interval Confirmed by Kristine Royal [phone]) on 03/30/2023 9:40:55 AM  Radiology CT ABDOMEN PELVIS W CONTRAST  Result Date: 03/30/2023 CLINICAL DATA:  Left lower quadrant abdominal pain. EXAM: CT ABDOMEN AND PELVIS WITH CONTRAST TECHNIQUE: Multidetector CT imaging of the abdomen and pelvis was performed using the standard protocol following bolus administration of intravenous contrast. RADIATION DOSE REDUCTION: This exam was performed according to the departmental dose-optimization program which includes automated exposure control, adjustment of the mA and/or kV according to patient size and/or use of iterative reconstruction technique. CONTRAST:  75mL OMNIPAQUE IOHEXOL 350 MG/ML SOLN COMPARISON:  06/09/2019. FINDINGS: Lower chest: No acute findings.  Stable centrilobular emphysema. Hepatobiliary: No focal liver abnormality is seen. No gallstones, gallbladder wall thickening, or biliary  dilatation. Pancreas: Unremarkable. No pancreatic ductal dilatation or surrounding inflammatory changes. Spleen: Normal in size without focal abnormality. Adrenals/Urinary Tract: Normal adrenal glands. Kidneys normal in size, orientation and position with symmetric enhancement and excretion. 2.5 cm exophytic midpole cyst from the right kidney, no follow-up recommended. No other renal masses, no stones and no hydronephrosis. Ureters normal in course and in caliber. Bladder mostly decompressed, otherwise unremarkable. Stomach/Bowel: Focal irregular wall thickening and narrowing of the left transverse colon extending for approximately 4.5 cm in length. Colon proximal to this is mildly distended, with no additional areas of wall thickening. Colon distal is decompressed. Normal stomach. Small bowel is normal in caliber without wall thickening or evidence of  inflammation. Vascular/Lymphatic: Mild aortic atherosclerosis. No aneurysm. No enlarged lymph nodes. Reproductive: Status post hysterectomy. Oval cystic mass lies along the left vulva consistent with a Bartholin's gland cyst. It measures 4 x 2.8 x 4.2 cm in size. This was present on the prior CT. Other: Small amount of ascites collects in posterior pelvic recess, minimally over the anterior dome of the liver. Musculoskeletal: No fracture or bone lesion. Sclerosis and associated Schmorl's node noted of the upper endplate of L5, similar, but less prominent, of the upper endplate of S1. IMPRESSION: 1. Irregular wall thickening with luminal narrowing of the left transverse colon spanning 4.5 cm suspicious for colonic malignancy. Follow-up colonoscopy recommended. 2. Small amount of ascites. 3. No other evidence of an acute or recent abnormality within the abdomen or pelvis. 4. Left vulvar region cyst consistent with a Bartholin's gland cyst similar to the previous CT. Electronically Signed   By: Amie Portland M.D.   On: 03/30/2023 10:54    Procedures Procedures     Medications Ordered in ED Medications  ketorolac (TORADOL) 15 MG/ML injection 15 mg (15 mg Intravenous Given 03/30/23 0916)  ondansetron (ZOFRAN) injection 4 mg (4 mg Intravenous Given 03/30/23 0915)  lactated ringers bolus 1,000 mL (0 mLs Intravenous Stopped 03/30/23 1057)  promethazine (PHENERGAN) 25 mg in sodium chloride 0.9 % 50 mL IVPB (0 mg Intravenous Stopped 03/30/23 1128)  iohexol (OMNIPAQUE) 350 MG/ML injection 75 mL (75 mLs Intravenous Contrast Given 03/30/23 1040)  HYDROmorphone (DILAUDID) injection 0.5 mg (0.5 mg Intravenous Given 03/30/23 1133)    ED Course/ Medical Decision Making/ A&P                                 Medical Decision Making Amount and/or Complexity of Data Reviewed Labs: ordered. Decision-making details documented in ED Course. Radiology: ordered. Decision-making details documented in ED Course.  Risk Prescription drug management.   Medical Decision Making:   Carol Mack is a 52 y.o. female who presented to the ED today with abdominal pain detailed above.    Patient's presentation is complicated by their history of HTN, asthma.  Complete initial physical exam performed, notably the patient  was nontoxic-appearing.  She had left-sided periumbilical abdominal tenderness.  No CVA tenderness.    Reviewed and confirmed nursing documentation for past medical history, family history, social history.    Initial Assessment:   With the patient's presentation of abdominal pain, differential diagnosis includes but is not limited to AAA, mesenteric ischemia, appendicitis, diverticulitis, DKA, gastritis, gastroenteritis, AMI, nephrolithiasis, pancreatitis, peritonitis, adrenal insufficiency, intestinal ischemia, constipation, UTI, SBO/LBO, splenic rupture, biliary disease, IBD, IBS, PUD, hepatitis, STD, ovarian/testicular torsion, electrolyte disturbance, DKA, dehydration, acute kidney injury, renal failure, cholecystitis, cholelithiasis, choledocholithiasis,  abdominal pain of  unknown etiology.    Initial Plan:  Screening labs including CBC and Metabolic panel to evaluate for infectious or metabolic etiology of disease.  Lipase to evaluate for pancreatitis Urinalysis with reflex culture ordered to evaluate for UTI or relevant urologic/nephrologic pathology.  CT abd/pelvis to evaluate for intra-abdominal pathology EKG to assess for cardiac pathology Symptomatic management Objective evaluation as reviewed   Initial Study Results:   Laboratory  All laboratory results reviewed without evidence of clinically relevant pathology.   Exceptions include: Albumin 3.4, hemoglobin 11.8  Radiology:  All images reviewed independently. Agree with radiology report at this time.   CT ABDOMEN PELVIS W CONTRAST  Result Date: 03/30/2023 CLINICAL DATA:  Left lower  quadrant abdominal pain. EXAM: CT ABDOMEN AND PELVIS WITH CONTRAST TECHNIQUE: Multidetector CT imaging of the abdomen and pelvis was performed using the standard protocol following bolus administration of intravenous contrast. RADIATION DOSE REDUCTION: This exam was performed according to the departmental dose-optimization program which includes automated exposure control, adjustment of the mA and/or kV according to patient size and/or use of iterative reconstruction technique. CONTRAST:  75mL OMNIPAQUE IOHEXOL 350 MG/ML SOLN COMPARISON:  06/09/2019. FINDINGS: Lower chest: No acute findings.  Stable centrilobular emphysema. Hepatobiliary: No focal liver abnormality is seen. No gallstones, gallbladder wall thickening, or biliary dilatation. Pancreas: Unremarkable. No pancreatic ductal dilatation or surrounding inflammatory changes. Spleen: Normal in size without focal abnormality. Adrenals/Urinary Tract: Normal adrenal glands. Kidneys normal in size, orientation and position with symmetric enhancement and excretion. 2.5 cm exophytic midpole cyst from the right kidney, no follow-up recommended. No other renal  masses, no stones and no hydronephrosis. Ureters normal in course and in caliber. Bladder mostly decompressed, otherwise unremarkable. Stomach/Bowel: Focal irregular wall thickening and narrowing of the left transverse colon extending for approximately 4.5 cm in length. Colon proximal to this is mildly distended, with no additional areas of wall thickening. Colon distal is decompressed. Normal stomach. Small bowel is normal in caliber without wall thickening or evidence of inflammation. Vascular/Lymphatic: Mild aortic atherosclerosis. No aneurysm. No enlarged lymph nodes. Reproductive: Status post hysterectomy. Oval cystic mass lies along the left vulva consistent with a Bartholin's gland cyst. It measures 4 x 2.8 x 4.2 cm in size. This was present on the prior CT. Other: Small amount of ascites collects in posterior pelvic recess, minimally over the anterior dome of the liver. Musculoskeletal: No fracture or bone lesion. Sclerosis and associated Schmorl's node noted of the upper endplate of L5, similar, but less prominent, of the upper endplate of S1. IMPRESSION: 1. Irregular wall thickening with luminal narrowing of the left transverse colon spanning 4.5 cm suspicious for colonic malignancy. Follow-up colonoscopy recommended. 2. Small amount of ascites. 3. No other evidence of an acute or recent abnormality within the abdomen or pelvis. 4. Left vulvar region cyst consistent with a Bartholin's gland cyst similar to the previous CT. Electronically Signed   By: Amie Portland M.D.   On: 03/30/2023 10:54      Final Assessment and Plan:   52 year old female presents the ED complaining of left-sided abdominal pain.  Patient nontoxic-appearing.  Also complains of uncontrollable vomiting.  Workup initiated as above for further assessment.  She is hypertensive but otherwise afebrile and without tachycardia.  Normal oxygen saturation on room air.  Labs reveal hemoglobin of 11.8 similar to previous.  Normal lipase, normal  LFTs, normal creatinine.  Otherwise no significant lab abnormalities.  Initially favored renal pathology as patient complains of intermittent, severe pain but UA without hematuria and does not appear acutely infected so instead obtain CT abdomen pelvis with contrast for further assessment.  CT scan reveals concern for colonic malignancy.  Extensive discussion of this in detail with patient as well as the importance of close follow-up with GI.  She has never had a previous colonoscopy.  No other findings on CT to explain her symptoms today.  Suspect that this is likely malignancy.  Appears by chart review that she endorsed weight loss to OB/GYN at previous appointment.  Patient without active emesis in the ED.  Tolerating p.o.  Has good pain control.  Discussed with attending physician who cosigned this note and we do believe she is stable for outpatient follow-up.  Referral placed to GI for outpatient colonoscopy.  Patient agreeable to follow-up.  Also provided with PCP follow-up.  PDMP reviewed.  Patient does have previously documented allergies to hydrocodone and oxycodone.  She tells me that she can take oxycodone and did have some side effects from it.  Negative PDMP for recent prescriptions.  With this, will prescribe oxycodone at home as needed for severe, breakthrough pain.  She will use Tylenol and ibuprofen as first-line.  Will keep Benadryl on hand should she have any allergic reactions though has tolerated this in the past.  Agreeable to follow-up with both PCP and GI.  Given very strict ED return precautions, all questions answered, and stable for discharge.   Clinical Impression:  1. Colonic mass   2. Left lower quadrant abdominal pain   3. Nausea and vomiting, unspecified vomiting type      Discharge           Final Clinical Impression(s) / ED Diagnoses Final diagnoses:  Colonic mass  Left lower quadrant abdominal pain  Nausea and vomiting, unspecified vomiting type    Rx / DC  Orders ED Discharge Orders          Ordered    Ambulatory referral to Gastroenterology        03/30/23 1115    ondansetron (ZOFRAN) 4 MG tablet  Every 8 hours PRN        03/30/23 1116    oxyCODONE (ROXICODONE) 5 MG immediate release tablet  Every 6 hours PRN        03/30/23 1122              Tonette Lederer, PA-C 03/30/23 1143    Wynetta Fines, MD 03/30/23 1615

## 2023-03-30 NOTE — ED Triage Notes (Signed)
Pt reports left side abdominal pain and vomiting that started last night. Pt denies fevers.

## 2023-03-31 ENCOUNTER — Other Ambulatory Visit: Payer: Self-pay | Admitting: Nurse Practitioner

## 2023-03-31 DIAGNOSIS — J40 Bronchitis, not specified as acute or chronic: Secondary | ICD-10-CM

## 2023-04-01 ENCOUNTER — Emergency Department (HOSPITAL_COMMUNITY)
Admission: EM | Admit: 2023-04-01 | Discharge: 2023-04-01 | Disposition: A | Discharge status: Home / Self Care | Payer: Commercial Managed Care - PPO | Attending: Emergency Medicine | Admitting: Emergency Medicine

## 2023-04-01 ENCOUNTER — Other Ambulatory Visit: Payer: Self-pay

## 2023-04-01 ENCOUNTER — Encounter (HOSPITAL_COMMUNITY): Payer: Self-pay

## 2023-04-01 DIAGNOSIS — Z79899 Other long term (current) drug therapy: Secondary | ICD-10-CM | POA: Diagnosis not present

## 2023-04-01 DIAGNOSIS — R1084 Generalized abdominal pain: Secondary | ICD-10-CM

## 2023-04-01 DIAGNOSIS — I10 Essential (primary) hypertension: Secondary | ICD-10-CM | POA: Insufficient documentation

## 2023-04-01 DIAGNOSIS — R109 Unspecified abdominal pain: Secondary | ICD-10-CM | POA: Diagnosis present

## 2023-04-01 DIAGNOSIS — E039 Hypothyroidism, unspecified: Secondary | ICD-10-CM | POA: Insufficient documentation

## 2023-04-01 DIAGNOSIS — K6389 Other specified diseases of intestine: Secondary | ICD-10-CM | POA: Insufficient documentation

## 2023-04-01 LAB — CBC WITH DIFFERENTIAL/PLATELET
Abs Immature Granulocytes: 0.02 10*3/uL (ref 0.00–0.07)
Basophils Absolute: 0 10*3/uL (ref 0.0–0.1)
Basophils Relative: 1 %
Eosinophils Absolute: 0.1 10*3/uL (ref 0.0–0.5)
Eosinophils Relative: 1 %
HCT: 35.9 % — ABNORMAL LOW (ref 36.0–46.0)
Hemoglobin: 12 g/dL (ref 12.0–15.0)
Immature Granulocytes: 0 %
Lymphocytes Relative: 28 %
Lymphs Abs: 2.3 10*3/uL (ref 0.7–4.0)
MCH: 21.3 pg — ABNORMAL LOW (ref 26.0–34.0)
MCHC: 33.4 g/dL (ref 30.0–36.0)
MCV: 63.7 fL — ABNORMAL LOW (ref 80.0–100.0)
Monocytes Absolute: 0.8 10*3/uL (ref 0.1–1.0)
Monocytes Relative: 10 %
Neutro Abs: 4.9 10*3/uL (ref 1.7–7.7)
Neutrophils Relative %: 60 %
Platelets: 332 10*3/uL (ref 150–400)
RBC: 5.64 MIL/uL — ABNORMAL HIGH (ref 3.87–5.11)
RDW: 18.5 % — ABNORMAL HIGH (ref 11.5–15.5)
WBC: 8.2 10*3/uL (ref 4.0–10.5)
nRBC: 0 % (ref 0.0–0.2)

## 2023-04-01 LAB — COMPREHENSIVE METABOLIC PANEL
ALT: 12 U/L (ref 0–44)
AST: 15 U/L (ref 15–41)
Albumin: 3.6 g/dL (ref 3.5–5.0)
Alkaline Phosphatase: 60 U/L (ref 38–126)
Anion gap: 8 (ref 5–15)
BUN: 9 mg/dL (ref 6–20)
CO2: 26 mmol/L (ref 22–32)
Calcium: 8.7 mg/dL — ABNORMAL LOW (ref 8.9–10.3)
Chloride: 102 mmol/L (ref 98–111)
Creatinine, Ser: 0.66 mg/dL (ref 0.44–1.00)
GFR, Estimated: >60 mL/min (ref >60)
Glucose, Bld: 104 mg/dL — ABNORMAL HIGH (ref 70–99)
Potassium: 3.3 mmol/L — ABNORMAL LOW (ref 3.5–5.1)
Sodium: 136 mmol/L (ref 135–145)
Total Bilirubin: 0.6 mg/dL (ref 0.3–1.2)
Total Protein: 7.2 g/dL (ref 6.5–8.1)

## 2023-04-01 MED ORDER — FENTANYL CITRATE PF 50 MCG/ML IJ SOSY
50.0000 ug | PREFILLED_SYRINGE | Freq: Once | INTRAMUSCULAR | Status: AC
  Administered 2023-04-01: 50 ug via INTRAVENOUS
  Filled 2023-04-01: qty 1

## 2023-04-01 MED ORDER — MORPHINE SULFATE (PF) 4 MG/ML IV SOLN
4.0000 mg | Freq: Once | INTRAVENOUS | Status: AC
  Administered 2023-04-01: 4 mg via INTRAVENOUS
  Filled 2023-04-01: qty 1

## 2023-04-01 MED ORDER — SODIUM CHLORIDE 0.9 % IV BOLUS
1000.0000 mL | Freq: Once | INTRAVENOUS | Status: AC
  Administered 2023-04-01: 1000 mL via INTRAVENOUS

## 2023-04-01 MED ORDER — OXYCODONE HCL 5 MG PO TABS
5.0000 mg | ORAL_TABLET | ORAL | 0 refills | Status: DC | PRN
Start: 2023-04-01 — End: 2023-05-08

## 2023-04-01 NOTE — ED Provider Triage Note (Signed)
Emergency Medicine Provider Triage Evaluation Note  Carol Mack , a 52 y.o. female  was evaluated in triage.  Pt complains of abdominal pain.  Pt seen yesterday for same  Review of Systems  Positive: nausea Negative: fever  Physical Exam  BP (!) 158/123 (BP Location: Right Arm)   Temp 99.1 F (37.3 C) (Oral)   Resp 17   LMP  (LMP Unknown)   SpO2 99%  Gen:   Awake, no distress   Resp:  Normal effort  MSK:   Moves extremities without difficulty  Other:    Medical Decision Making  Medically screening exam initiated at 12:43 PM.  Appropriate orders placed.  Derick Avitia was informed that the remainder of the evaluation will be completed by another provider, this initial triage assessment does not replace that evaluation, and the importance of remaining in the ED until their evaluation is complete.     Elson Areas, [place] 04/01/23 1244

## 2023-04-01 NOTE — ED Provider Notes (Signed)
[place] EMERGENCY DEPARTMENT AT [facility] Provider Note   CSN: [id number] Arrival date & time: 04/01/23  1232     History Chief Complaint  Patient presents with   Abdominal Pain    Carol Mack is a 52 y.o. female.  Patient presents emergency department with a past medical history significant for hypertension, hypothyroidism, colonic mass here today with concerns of abdominal pain.  Reports the pain has been ongoing for several weeks and worse in the last night or so.  She was seen at [facility] yesterday with CT scan showing colonic mass.  No prior history of colonoscopy in not currently seeing GI.  States that she has had a couple of episodes of vomiting due to severe pain.  States that the pain medications that she was in normal have not been helpful with her pain control.  Denies any fevers, diarrhea, acute changes in bowel movements, chest pain or shortness of breath.   Abdominal Pain      Home Medications Prior to Admission medications   Medication Sig Start Date End Date Taking? Authorizing Provider  albuterol (VENTOLIN HFA) 108 (90 Base) MCG/ACT inhaler Inhale 1-2 puffs into the lungs every 6 (six) hours as needed for shortness of breath. 12/30/22  Yes Milas Hock, MD  amLODipine (NORVASC) 5 MG tablet Take 1 tablet (5 mg total) by mouth daily. 12/30/22  Yes Milas Hock, MD  oxyCODONE (ROXICODONE) 5 MG immediate release tablet Take 1 tablet (5 mg total) by mouth every 4 (four) hours as needed for severe pain. 04/01/23  Yes Smitty Knudsen, PA-C  benzonatate (TESSALON) 100 MG capsule Take 1 capsule (100 mg total) by mouth 3 (three) times daily as needed. Patient not taking: Reported on 04/01/2023 01/01/23   Viviano Simas, FNP  fluconazole (DIFLUCAN) 150 MG tablet Take 1 tablet (150 mg total) by mouth every 3 (three) days. For three doses Patient not taking: Reported on 04/01/2023 12/31/22   Milas Hock, MD  metroNIDAZOLE (METROGEL) 0.75 % vaginal gel  Place 1 Applicatorful vaginally at bedtime. Apply one applicatorful to vagina at bedtime for 5 days Patient not taking: Reported on 04/01/2023 12/30/22   Milas Hock, MD  metroNIDAZOLE (METROGEL) 0.75 % vaginal gel Place 1 Applicatorful vaginally once a week. Weekly for 6 months. Patient not taking: Reported on 04/01/2023 12/31/22   Milas Hock, MD  nicotine polacrilex (NICORETTE) 4 MG gum Take 1 each (4 mg total) by mouth as needed for smoking cessation. Patient not taking: Reported on 04/01/2023 01/01/23   Viviano Simas, FNP  ondansetron (ZOFRAN) 4 MG tablet Take 1 tablet (4 mg total) by mouth every 8 (eight) hours as needed for up to 7 days for nausea or vomiting. Patient not taking: Reported on 04/01/2023 03/30/23 04/06/23  Ysidro Evert L, PA-C  oxyCODONE (ROXICODONE) 5 MG immediate release tablet Take 1 tablet (5 mg total) by mouth every 6 (six) hours as needed for up to 5 days for severe pain or breakthrough pain. Patient not taking: Reported on 04/01/2023 03/30/23 04/04/23  Tonette Lederer, PA-C      Allergies    Hydrocodone and Percocet [oxycodone-acetaminophen]    Review of Systems   Review of Systems  Gastrointestinal:  Positive for abdominal pain.  All other systems reviewed and are negative.   Physical Exam Updated Vital Signs BP (!) 159/102 Comment: nurse notified  Pulse 79   Temp 98.8 F (37.1 C) (Oral)   Resp 15   Ht 5\' 4"  (1.626 m)  Wt 62 kg   LMP  (LMP Unknown)   SpO2 91%   BMI 23.46 kg/m  Physical Exam Vitals and nursing note reviewed.  Constitutional:      General: She is not in acute distress.    Appearance: She is well-developed.  HENT:     Head: Normocephalic and atraumatic.  Eyes:     Conjunctiva/sclera: Conjunctivae normal.  Cardiovascular:     Rate and Rhythm: Normal rate and regular rhythm.     Heart sounds: No murmur heard. Pulmonary:     Effort: Pulmonary effort is normal. No respiratory distress.     Breath sounds: Normal breath sounds.   Abdominal:     Palpations: Abdomen is soft.     Tenderness: There is abdominal tenderness in the periumbilical area. There is no guarding.  Musculoskeletal:        General: No swelling.     Cervical back: Neck supple.  Skin:    General: Skin is warm and dry.     Capillary Refill: Capillary refill takes less than 2 seconds.  Neurological:     Mental Status: She is alert.  Psychiatric:        Mood and Affect: Mood normal.     ED Results / Procedures / Treatments   Labs (all labs ordered are listed, but only abnormal results are displayed) Labs Reviewed  CBC WITH DIFFERENTIAL/PLATELET - Abnormal; Notable for the following components:      Result Value   RBC 5.64 (*)    HCT 35.9 (*)    MCV 63.7 (*)    MCH 21.3 (*)    RDW 18.5 (*)    All other components within normal limits  COMPREHENSIVE METABOLIC PANEL - Abnormal; Notable for the following components:   Potassium 3.3 (*)    Glucose, Bld 104 (*)    Calcium 8.7 (*)    All other components within normal limits    EKG None  Radiology No results found.  Procedures Procedures   Medications Ordered in ED Medications  sodium chloride 0.9 % bolus 1,000 mL (0 mLs Intravenous Stopped 04/01/23 1915)  fentaNYL (SUBLIMAZE) injection 50 mcg (50 mcg Intravenous Given 04/01/23 1620)  morphine (PF) 4 MG/ML injection 4 mg (4 mg Intravenous Given 04/01/23 1818)    ED Course/ Medical Decision Making/ A&P                               Medical Decision Making Risk Prescription drug management.   This patient presents to the ED for concern of abdominal pain.  Differential diagnosis includes bowel obstruction, pancreatitis, abdominal mass, urinary tract infection   Lab Tests:  I Ordered, and personally interpreted labs.  The pertinent results include: CBC with no obvious derangements from baseline, CMP with only mild hypokalemia 3.3 likely due to GI loss   Medicines ordered and prescription drug management:  I ordered  medication including fluids, fentanyl, morphine for dehydration, pain Reevaluation of the patient after these medicines showed that the patient improved I have reviewed the patients home medicines and have made adjustments as needed   Problem List / ED Course:  Patient presents emergency department concerns of abdominal pain.  She was seen in the emergency department approximately 2 days ago for similar concerns and was found to have a colonic mass.  This mass is concerning for possible emergency and patient was advised to follow-up with GI for colonoscopy and further assessment.  Patient  reports the pain has not improved at home and is concerned with worsening pain.  Struggling with oral intake as well 2.  Notable nausea although she does have Zofran prescribed has been taking this as prescribed.  Will reevaluate with basic labs but doubt need for further imaging as patient had CT imaging from 2 days ago. Labs without any evidence of any acute infection with white count at 8.2 and no significant electrolyte derangements with potassium of 3.3.  Fluids and pain medicine given with a dose of fentanyl and morphine for management of acute pain.  Had some improvement but reports that she would like to be discharged home with a refill on her oxycodone which was prescribed 2 days ago.  Advised patient that she needs to be seen by primary care and GI for further evaluation and management.  Patient and family are agreeable with current treatment plan and no further concerns.  Low concern for acute cause requiring inpatient admission.  Discussed strict return precautions and patient and family agreeable current treatment plan.  All questions answered prior to patient discharge.  Patient discharged home in stable condition.   Social Determinants of Health:  Smoking history  Final Clinical Impression(s) / ED Diagnoses Final diagnoses:  Generalized abdominal pain  Colonic mass    Rx / DC Orders ED Discharge  Orders          Ordered    oxyCODONE (ROXICODONE) 5 MG immediate release tablet  Every 4 hours PRN        04/01/23 1938              Smitty Knudsen, PA-C 04/01/23 1941    [name], MD 04/02/23 1350

## 2023-04-01 NOTE — Discharge Instructions (Signed)
You are seen in the emergency department today for abdominal pain.  You had some improvement with the medications given here in the ED.  You were also given 1 L of fluids for rehydration.  You ultimately need to be seen by the gastroenterologist for further evaluation of this colonic mass.  If you have any worsening of your symptoms, please return the emergency department.

## 2023-04-01 NOTE — ED Triage Notes (Signed)
Pt arrived reporting Abdominal pain a few weeks, got worse last night. States she was seen at [facility] and scan showed an abdominal mass. Endorses few episodes of vomiting. Denies any other symptoms.

## 2023-04-14 ENCOUNTER — Ambulatory Visit (INDEPENDENT_AMBULATORY_CARE_PROVIDER_SITE_OTHER): Payer: Commercial Managed Care - PPO | Admitting: Primary Care

## 2023-04-14 ENCOUNTER — Encounter (INDEPENDENT_AMBULATORY_CARE_PROVIDER_SITE_OTHER): Payer: Self-pay | Admitting: Primary Care

## 2023-04-14 VITALS — BP 160/96 | HR 71 | Resp 16 | Ht 64.0 in | Wt 132.2 lb

## 2023-04-14 DIAGNOSIS — E0590 Thyrotoxicosis, unspecified without thyrotoxic crisis or storm: Secondary | ICD-10-CM

## 2023-04-14 DIAGNOSIS — R630 Anorexia: Secondary | ICD-10-CM

## 2023-04-14 DIAGNOSIS — Z7689 Persons encountering health services in other specified circumstances: Secondary | ICD-10-CM | POA: Diagnosis not present

## 2023-04-14 DIAGNOSIS — I10 Essential (primary) hypertension: Secondary | ICD-10-CM | POA: Diagnosis not present

## 2023-04-14 DIAGNOSIS — Z720 Tobacco use: Secondary | ICD-10-CM

## 2023-04-14 DIAGNOSIS — K6389 Other specified diseases of intestine: Secondary | ICD-10-CM

## 2023-04-14 DIAGNOSIS — R634 Abnormal weight loss: Secondary | ICD-10-CM

## 2023-04-14 DIAGNOSIS — Z1231 Encounter for screening mammogram for malignant neoplasm of breast: Secondary | ICD-10-CM

## 2023-04-14 MED ORDER — AMLODIPINE-OLMESARTAN 10-40 MG PO TABS
1.0000 | ORAL_TABLET | Freq: Every day | ORAL | 3 refills | Status: DC
Start: 2023-04-14 — End: 2023-07-14

## 2023-04-14 NOTE — Progress Notes (Signed)
[facility]   Subjective:  [name] is a 52 y.o. female presents for ED up and establish care.  Presented to ED on 04/01/23 was concerns of abdominal pain which had been going on for several weeks and worse in the last night or so. Dx with Generalized abdominal pain and.Colonic mass. Denies any fevers, diarrhea, acute changes in bowel movements, chest pain or shortness of breath. Elevated Bp on Amlodipine 5mg  that she feels is ineffective. Admits to not talking medication as prescribe. She is also having abdominal pain left side that she feels is a contributing factor. Past Medical History:  Diagnosis Date   Asthma    Bartholin cyst    Chiari malformation type I (HCC)    Hernia, inguinal    Hernia, inguinal    Hypertension      Allergies  Allergen Reactions   Hydrocodone Itching   Percocet [Oxycodone-Acetaminophen] Anxiety and Other (See Comments)    Made the patient feel "jittery"      Current Outpatient Medications on File Prior to Visit  Medication Sig Dispense Refill   albuterol (VENTOLIN HFA) 108 (90 Base) MCG/ACT inhaler Inhale 1-2 puffs into the lungs every 6 (six) hours as needed for shortness of breath. 2 each 1   amLODipine (NORVASC) 5 MG tablet Take 1 tablet (5 mg total) by mouth daily. 30 tablet 1   benzonatate (TESSALON) 100 MG capsule Take 1 capsule (100 mg total) by mouth 3 (three) times daily as needed. (Patient not taking: Reported on 04/01/2023) 30 capsule 0   fluconazole (DIFLUCAN) 150 MG tablet Take 1 tablet (150 mg total) by mouth every 3 (three) days. For three doses (Patient not taking: Reported on 04/01/2023) 3 tablet 3   metroNIDAZOLE (METROGEL) 0.75 % vaginal gel Place 1 Applicatorful vaginally at bedtime. Apply one applicatorful to vagina at bedtime for 5 days (Patient not taking: Reported on 04/01/2023) 70 g 1   metroNIDAZOLE (METROGEL) 0.75 % vaginal gel Place 1 Applicatorful vaginally once a week. Weekly for 6 months. (Patient not  taking: Reported on 04/01/2023) 70 g 3   nicotine polacrilex (NICORETTE) 4 MG gum Take 1 each (4 mg total) by mouth as needed for smoking cessation. (Patient not taking: Reported on 04/01/2023) 100 tablet 0   oxyCODONE (ROXICODONE) 5 MG immediate release tablet Take 1 tablet (5 mg total) by mouth every 4 (four) hours as needed for severe pain. 15 tablet 0   No current facility-administered medications on file prior to visit.     Review of System: Comprehensive ROS Pertinent positive and negative noted in HPI    Objective:  LMP  (LMP Unknown)  BP (!) 162/108 (BP Location: Right Arm, Patient Position: Sitting, Cuff Size: Normal)   Pulse 71   Resp 16   Ht 5\' 4"  (1.626 m)   Wt 132 lb 3.2 oz (60 kg)   LMP  (LMP Unknown)   SpO2 99%   BMI 22.69 kg/m   Physical Exam: General Appearance: Well nourished, thin frame in mild distress. Eyes: PERRLA, EOMs, conjunctiva no swelling or erythema Sinuses: No Frontal/maxillary tenderness ENT/Mouth: Ext aud canals clear, TMs without erythema, bulging. No erythema, swelling, or exudate on post pharynx.  Tonsils not swollen or erythematous. Hearing normal.  Neck: Supple, thyroid normal.  Respiratory: Respiratory effort normal, BS equal bilaterally without rales, rhonchi, wheezing or stridor.  Cardio: RRR with no MRGs. Brisk peripheral pulses without edema.  Abdomen: Soft, + BS.  Non tender, no guarding, rebound, hernias, masses. Lymphatics: Non  tender without lymphadenopathy.  Musculoskeletal: Full ROM, 5/5 strength, normal gait.  Skin: Warm, dry without rashes, lesions, ecchymosis.  Neuro: Cranial nerves intact. Normal muscle tone, no cerebellar symptoms. Sensation intact.  Psych: Awake and oriented X 3, normal affect, Insight and Judgment appropriate.    Assessment:  Carol Mack was seen today for hospitalization follow-up.  Diagnoses and all orders for this visit:  Essential hypertension BP goal - < 130/80 Explained that having normal blood  pressure is the goal and medications are helping to get to goal and maintain normal blood pressure. DIET: Limit salt intake, read nutrition labels to check salt content, limit fried and high fatty foods  Avoid using multisymptom OTC cold preparations that generally contain sudafed which can rise BP. Consult with pharmacist on best cold relief products to use for persons with HTN EXERCISE Discussed incorporating exercise such as walking - 30 minutes most days of the week and can do in 10 minute intervals    -     amLODipine-olmesartan (AZOR) 10-40 MG tablet; Take 1 tablet by mouth daily.  Hyperthyroidism 2/2 No appetite -     TSH + free T4  Encounter to establish care   Loss of weight 2/2 Hyperthyroidism  -     TSH + free T4  Tobacco abuse - I have recommended complete cessation of tobacco use. I have discussed various options available for assistance with tobacco cessation including over the counter methods (Nicotine gum, patch and lozenges). We also discussed prescription options (Chantix, Nicotine Inhaler / Nasal Spray). The patient is not interested in pursuing any prescription tobacco cessation options at this time. - Patient declines at this time.   Colonic mass Abdominal pain referred to GI Tylenol only  -     Ambulatory referral to Gastroenterology   Encounter for screening mammogram for malignant neoplasm of breast  MM DIGITAL SCREENING BILATERAL   This note has been created with Education officer, environmental. Any transcriptional errors are unintentional.   Carol Sessions, NP 04/14/2023, 1:44 PM

## 2023-04-14 NOTE — Patient Instructions (Signed)
Hypertension, Adult High blood pressure (hypertension) is when the force of blood pumping through the arteries is too strong. The arteries are the blood vessels that carry blood from the heart throughout the body. Hypertension forces the heart to work harder to pump blood and may cause arteries to become narrow or stiff. Untreated or uncontrolled hypertension can lead to a heart attack, heart failure, a stroke, kidney disease, and other problems. A blood pressure reading consists of a higher number over a lower number. Ideally, your blood pressure should be below 120/80. The first ("top") number is called the systolic pressure. It is a measure of the pressure in your arteries as your heart beats. The second ("bottom") number is called the diastolic pressure. It is a measure of the pressure in your arteries as the heart relaxes. What are the causes? The exact cause of this condition is not known. There are some conditions that result in high blood pressure. What increases the risk? Certain factors may make you more likely to develop high blood pressure. Some of these risk factors are under your control, including: Smoking. Not getting enough exercise or physical activity. Being overweight. Having too much fat, sugar, calories, or salt (sodium) in your diet. Drinking too much alcohol. Other risk factors include: Having a personal history of heart disease, diabetes, high cholesterol, or kidney disease. Stress. Having a family history of high blood pressure and high cholesterol. Having obstructive sleep apnea. Age. The risk increases with age. What are the signs or symptoms? High blood pressure may not cause symptoms. Very high blood pressure (hypertensive crisis) may cause: Headache. Fast or irregular heartbeats (palpitations). Shortness of breath. Nosebleed. Nausea and vomiting. Vision changes. Severe chest pain, dizziness, and seizures. How is this diagnosed? This condition is diagnosed by  measuring your blood pressure while you are seated, with your arm resting on a flat surface, your legs uncrossed, and your feet flat on the floor. The cuff of the blood pressure monitor will be placed directly against the skin of your upper arm at the level of your heart. Blood pressure should be measured at least twice using the same arm. Certain conditions can cause a difference in blood pressure between your right and left arms. If you have a high blood pressure reading during one visit or you have normal blood pressure with other risk factors, you may be asked to: Return on a different day to have your blood pressure checked again. Monitor your blood pressure at home for 1 week or longer. If you are diagnosed with hypertension, you may have other blood or imaging tests to help your health care provider understand your overall risk for other conditions. How is this treated? This condition is treated by making healthy lifestyle changes, such as eating healthy foods, exercising more, and reducing your alcohol intake. You may be referred for counseling on a healthy diet and physical activity. Your health care provider may prescribe medicine if lifestyle changes are not enough to get your blood pressure under control and if: Your systolic blood pressure is above 130. Your diastolic blood pressure is above 80. Your personal target blood pressure may vary depending on your medical conditions, your age, and other factors. Follow these instructions at home: Eating and drinking  Eat a diet that is high in fiber and potassium, and low in sodium, added sugar, and fat. An example of this eating plan is called the DASH diet. DASH stands for Dietary Approaches to Stop Hypertension. To eat this way: Eat   plenty of fresh fruits and vegetables. Try to fill one half of your plate at each meal with fruits and vegetables. Eat whole grains, such as whole-wheat pasta, brown rice, or whole-grain bread. Fill about one  fourth of your plate with whole grains. Eat or drink low-fat dairy products, such as skim milk or low-fat yogurt. Avoid fatty cuts of meat, processed or cured meats, and poultry with skin. Fill about one fourth of your plate with lean proteins, such as fish, chicken without skin, beans, eggs, or tofu. Avoid pre-made and processed foods. These tend to be higher in sodium, added sugar, and fat. Reduce your daily sodium intake. Many people with hypertension should eat less than 1,500 mg of sodium a day. Do not drink alcohol if: Your health care provider tells you not to drink. You are pregnant, may be pregnant, or are planning to become pregnant. If you drink alcohol: Limit how much you have to: 0-1 drink a day for women. 0-2 drinks a day for men. Know how much alcohol is in your drink. In the [place] one drink equals one 12 oz bottle of beer (355 mL), one 5 oz glass of wine (148 mL), or one 1 oz glass of hard liquor (44 mL). Lifestyle  Work with your health care provider to maintain a healthy body weight or to lose weight. Ask what an ideal weight is for you. Get at least 30 minutes of exercise that causes your heart to beat faster (aerobic exercise) most days of the week. Activities may include walking, swimming, or biking. Include exercise to strengthen your muscles (resistance exercise), such as Pilates or lifting weights, as part of your weekly exercise routine. Try to do these types of exercises for 30 minutes at least 3 days a week. Do not use any products that contain nicotine or tobacco. These products include cigarettes, chewing tobacco, and vaping devices, such as e-cigarettes. If you need help quitting, ask your health care provider. Monitor your blood pressure at home as told by your health care provider. Keep all follow-up visits. This is important. Medicines Take over-the-counter and prescription medicines only as told by your health care provider. Follow directions carefully. Blood  pressure medicines must be taken as prescribed. Do not skip doses of blood pressure medicine. Doing this puts you at risk for problems and can make the medicine less effective. Ask your health care provider about side effects or reactions to medicines that you should watch for. Contact a health care provider if you: Think you are having a reaction to a medicine you are taking. Have headaches that keep coming back (recurring). Feel dizzy. Have swelling in your ankles. Have trouble with your vision. Get help right away if you: Develop a severe headache or confusion. Have unusual weakness or numbness. Feel faint. Have severe pain in your chest or abdomen. Vomit repeatedly. Have trouble breathing. These symptoms may be an emergency. Get help right away. Call 911. Do not wait to see if the symptoms will go away. Do not drive yourself to the hospital. Summary Hypertension is when the force of blood pumping through your arteries is too strong. If this condition is not controlled, it may put you at risk for serious complications. Your personal target blood pressure may vary depending on your medical conditions, your age, and other factors. For most people, a normal blood pressure is less than 120/80. Hypertension is treated with lifestyle changes, medicines, or a combination of both. Lifestyle changes include losing weight, eating a healthy,   low-sodium diet, exercising more, and limiting alcohol. This information is not intended to replace advice given to you by your health care provider. Make sure you discuss any questions you have with your health care provider. Document Revised: 05/08/2021 Document Reviewed: 05/08/2021 Elsevier Patient Education  2024 [employer]  

## 2023-04-15 LAB — TSH+FREE T4
Free T4: 1.26 ng/dL (ref 0.82–1.77)
TSH: 0.518 u[IU]/mL (ref 0.450–4.500)

## 2023-04-16 ENCOUNTER — Encounter (INDEPENDENT_AMBULATORY_CARE_PROVIDER_SITE_OTHER): Payer: Self-pay

## 2023-04-16 ENCOUNTER — Telehealth (INDEPENDENT_AMBULATORY_CARE_PROVIDER_SITE_OTHER): Payer: Self-pay | Admitting: Primary Care

## 2023-04-16 NOTE — Telephone Encounter (Signed)
Patient dropped off document FMLA, to be filled out by provider. Patient requested to send it back via Fax within 5-days. Document is located in providers tray at front office. Fax document to [phone].

## 2023-04-17 NOTE — Telephone Encounter (Signed)
Pt has been made aware of that paperwork takes 7-14 business days

## 2023-04-24 ENCOUNTER — Ambulatory Visit: Payer: Commercial Managed Care - PPO | Admitting: Family

## 2023-04-28 ENCOUNTER — Ambulatory Visit (INDEPENDENT_AMBULATORY_CARE_PROVIDER_SITE_OTHER): Payer: Self-pay

## 2023-04-29 ENCOUNTER — Ambulatory Visit (INDEPENDENT_AMBULATORY_CARE_PROVIDER_SITE_OTHER): Payer: Commercial Managed Care - PPO

## 2023-04-29 VITALS — BP 125/82

## 2023-04-29 DIAGNOSIS — Z0130 Encounter for examination of blood pressure without abnormal findings: Secondary | ICD-10-CM

## 2023-04-29 NOTE — Progress Notes (Signed)
Blood Pressure Recheck Visit  Name: Jeleah Napoleon MRN: [id number] Date of Birth: [birth year]  Liliane Channel presents today for Blood Pressure recheck with clinical support staff.   BP Readings from Last 3 Encounters:  04/29/23 125/82  04/14/23 (!) 160/96  04/01/23 (!) 164/90    Current Outpatient Medications  Medication Sig Dispense Refill   albuterol (VENTOLIN HFA) 108 (90 Base) MCG/ACT inhaler Inhale 1-2 puffs into the lungs every 6 (six) hours as needed for shortness of breath. 2 each 1   amLODipine-olmesartan (AZOR) 10-40 MG tablet Take 1 tablet by mouth daily. 90 tablet 3   benzonatate (TESSALON) 100 MG capsule Take 1 capsule (100 mg total) by mouth 3 (three) times daily as needed. (Patient not taking: Reported on 04/01/2023) 30 capsule 0   fluconazole (DIFLUCAN) 150 MG tablet Take 1 tablet (150 mg total) by mouth every 3 (three) days. For three doses (Patient not taking: Reported on 04/01/2023) 3 tablet 3   metroNIDAZOLE (METROGEL) 0.75 % vaginal gel Place 1 Applicatorful vaginally at bedtime. Apply one applicatorful to vagina at bedtime for 5 days (Patient not taking: Reported on 04/01/2023) 70 g 1   metroNIDAZOLE (METROGEL) 0.75 % vaginal gel Place 1 Applicatorful vaginally once a week. Weekly for 6 months. (Patient not taking: Reported on 04/01/2023) 70 g 3   nicotine polacrilex (NICORETTE) 4 MG gum Take 1 each (4 mg total) by mouth as needed for smoking cessation. (Patient not taking: Reported on 04/01/2023) 100 tablet 0   oxyCODONE (ROXICODONE) 5 MG immediate release tablet Take 1 tablet (5 mg total) by mouth every 4 (four) hours as needed for severe pain. 15 tablet 0   No current facility-administered medications for this visit.    Hypertensive Medication Review: Patient states that they are taking all their hypertensive medications as prescribed and their last dose of hypertensive medications was this morning   Documentation of any medication adherence discrepancies:  none  Provider Recommendation:  Spoke to [place] and they stated: bp is better. Continue taking medications and will see her in 3 months   Patient has been scheduled to follow up with Marcelino Duster for 07/14/2023  Patient has been given provider's recommendations and does not have any questions or concerns at this time. Patient will contact the office for any future questions or concerns.

## 2023-05-05 NOTE — Telephone Encounter (Signed)
Contacted pt and lvm making pt aware that fmla is ready for pickup at the front desk

## 2023-05-08 ENCOUNTER — Emergency Department (HOSPITAL_COMMUNITY): Payer: Commercial Managed Care - PPO

## 2023-05-08 ENCOUNTER — Encounter (HOSPITAL_COMMUNITY): Payer: Self-pay

## 2023-05-08 ENCOUNTER — Other Ambulatory Visit: Payer: Self-pay

## 2023-05-08 ENCOUNTER — Emergency Department (HOSPITAL_COMMUNITY)
Admission: EM | Admit: 2023-05-08 | Discharge: 2023-05-08 | Disposition: A | Discharge status: Home / Self Care | Payer: Commercial Managed Care - PPO | Attending: Emergency Medicine | Admitting: Emergency Medicine

## 2023-05-08 DIAGNOSIS — C189 Malignant neoplasm of colon, unspecified: Secondary | ICD-10-CM | POA: Diagnosis not present

## 2023-05-08 DIAGNOSIS — K6389 Other specified diseases of intestine: Secondary | ICD-10-CM

## 2023-05-08 DIAGNOSIS — R1084 Generalized abdominal pain: Secondary | ICD-10-CM | POA: Diagnosis present

## 2023-05-08 DIAGNOSIS — Z79899 Other long term (current) drug therapy: Secondary | ICD-10-CM | POA: Insufficient documentation

## 2023-05-08 LAB — CBC
HCT: 38.6 % (ref 36.0–46.0)
Hemoglobin: 12.7 g/dL (ref 12.0–15.0)
MCH: 20.3 pg — ABNORMAL LOW (ref 26.0–34.0)
MCHC: 32.9 g/dL (ref 30.0–36.0)
MCV: 61.7 fL — ABNORMAL LOW (ref 80.0–100.0)
Platelets: 390 10*3/uL (ref 150–400)
RBC: 6.26 MIL/uL — ABNORMAL HIGH (ref 3.87–5.11)
RDW: 21.3 % — ABNORMAL HIGH (ref 11.5–15.5)
WBC: 9.3 10*3/uL (ref 4.0–10.5)
nRBC: 0 % (ref 0.0–0.2)

## 2023-05-08 LAB — COMPREHENSIVE METABOLIC PANEL
ALT: 11 U/L (ref 0–44)
AST: 16 U/L (ref 15–41)
Albumin: 3.8 g/dL (ref 3.5–5.0)
Alkaline Phosphatase: 66 U/L (ref 38–126)
Anion gap: 11 (ref 5–15)
BUN: 15 mg/dL (ref 6–20)
CO2: 25 mmol/L (ref 22–32)
Calcium: 9.5 mg/dL (ref 8.9–10.3)
Chloride: 102 mmol/L (ref 98–111)
Creatinine, Ser: 0.75 mg/dL (ref 0.44–1.00)
GFR, Estimated: >60 mL/min (ref >60)
Glucose, Bld: 94 mg/dL (ref 70–99)
Potassium: 3.7 mmol/L (ref 3.5–5.1)
Sodium: 138 mmol/L (ref 135–145)
Total Bilirubin: 0.6 mg/dL (ref 0.3–1.2)
Total Protein: 8 g/dL (ref 6.5–8.1)

## 2023-05-08 LAB — LIPASE, BLOOD: Lipase: 22 U/L (ref 11–51)

## 2023-05-08 MED ORDER — HYDROMORPHONE HCL 1 MG/ML IJ SOLN
1.0000 mg | Freq: Once | INTRAMUSCULAR | Status: AC
  Administered 2023-05-08: 1 mg via INTRAVENOUS
  Filled 2023-05-08: qty 1

## 2023-05-08 MED ORDER — ONDANSETRON 4 MG PO TBDP: 4.0000 mg | ORAL_TABLET | Freq: Once | ORAL | Status: DC | PRN

## 2023-05-08 MED ORDER — ONDANSETRON HCL 4 MG/2ML IJ SOLN
4.0000 mg | Freq: Once | INTRAMUSCULAR | Status: AC
  Administered 2023-05-08: 4 mg via INTRAVENOUS
  Filled 2023-05-08: qty 2

## 2023-05-08 MED ORDER — PROMETHAZINE HCL 25 MG RE SUPP
25.0000 mg | Freq: Four times a day (QID) | RECTAL | 0 refills | Status: DC | PRN
Start: 2023-05-08 — End: 2023-06-19

## 2023-05-08 MED ORDER — ONDANSETRON 4 MG PO TBDP: ORAL_TABLET | ORAL | 0 refills | Status: DC

## 2023-05-08 MED ORDER — FENTANYL CITRATE PF 50 MCG/ML IJ SOSY
50.0000 ug | PREFILLED_SYRINGE | INTRAMUSCULAR | Status: DC | PRN
  Administered 2023-05-08: 50 ug via INTRAVENOUS
  Filled 2023-05-08: qty 1

## 2023-05-08 MED ORDER — OXYCODONE HCL 5 MG PO TABS: 5.0000 mg | ORAL_TABLET | ORAL | 0 refills | Status: DC | PRN

## 2023-05-08 NOTE — Discharge Instructions (Signed)
Continue to try to schedule follow-up with [facility] gastroenterology.  Please contact [facility] surgical services to schedule follow-up as well.

## 2023-05-08 NOTE — ED Notes (Signed)
Pt D/C'd by Windle Guard.  Orland Jarred out of system by me.

## 2023-05-08 NOTE — ED Triage Notes (Signed)
Recently dx with mass in intestines.  Reports it keeps giving her pain.  Complains of n/v but no diarrhea.

## 2023-05-08 NOTE — ED Provider Notes (Signed)
[place] EMERGENCY DEPARTMENT AT [facility] Provider Note   CSN: [id number] Arrival date & time: 05/08/23  0350     History  Chief Complaint  Patient presents with   Abdominal Pain    Carol Mack is a 52 y.o. female.  Patient presents to the emergency department for evaluation of abdominal pain.  Patient was recently diagnosed with a colonic mass by CT during an ER visit.  She has not yet had follow-up for further evaluation and treatment.  She reports she has constant pain and has not been able to eat or drink much because of nausea and vomiting.       Home Medications Prior to Admission medications   Medication Sig Start Date End Date Taking? Authorizing Provider  ondansetron (ZOFRAN-ODT) 4 MG disintegrating tablet 4mg  ODT q4 hours prn nausea/vomit 05/08/23  Yes Jessey Huyett, Canary Brim, MD  promethazine (PHENERGAN) 25 MG suppository Place 1 suppository (25 mg total) rectally every 6 (six) hours as needed for nausea or vomiting. 05/08/23  Yes Makynzi Eastland, Canary Brim, MD  albuterol (VENTOLIN HFA) 108 (90 Base) MCG/ACT inhaler Inhale 1-2 puffs into the lungs every 6 (six) hours as needed for shortness of breath. 12/30/22   Milas Hock, MD  amLODipine-olmesartan (AZOR) 10-40 MG tablet Take 1 tablet by mouth daily. 04/14/23   [name], NP  oxyCODONE (ROXICODONE) 5 MG immediate release tablet Take 1 tablet (5 mg total) by mouth every 4 (four) hours as needed for severe pain (pain score 7-10). 05/08/23   Gilda Crease, MD      Allergies    Percocet [oxycodone-acetaminophen]    Review of Systems   Review of Systems  Physical Exam Updated Vital Signs BP 116/84   Pulse 69   Temp (!) 97.5 F (36.4 C) (Oral)   Resp 19   Ht 5\' 4"  (1.626 m)   Wt 59.9 kg   LMP  (LMP Unknown)   SpO2 94%   BMI 22.66 kg/m  Physical Exam Vitals and nursing note reviewed.  Constitutional:      General: She is not in acute distress.    Appearance: She is  well-developed.  HENT:     Head: Normocephalic and atraumatic.     Mouth/Throat:     Mouth: Mucous membranes are moist.  Eyes:     General: Vision grossly intact. Gaze aligned appropriately.     Extraocular Movements: Extraocular movements intact.     Conjunctiva/sclera: Conjunctivae normal.  Cardiovascular:     Rate and Rhythm: Normal rate and regular rhythm.     Pulses: Normal pulses.     Heart sounds: Normal heart sounds, S1 normal and S2 normal. No murmur heard.    No friction rub. No gallop.  Pulmonary:     Effort: Pulmonary effort is normal. No respiratory distress.     Breath sounds: Normal breath sounds.  Abdominal:     General: Bowel sounds are normal.     Palpations: Abdomen is soft.     Tenderness: There is generalized abdominal tenderness. There is no guarding or rebound.     Hernia: No hernia is present.  Musculoskeletal:        General: No swelling.     Cervical back: Full passive range of motion without pain, normal range of motion and neck supple. No spinous process tenderness or muscular tenderness. Normal range of motion.     Right lower leg: No edema.     Left lower leg: No edema.  Skin:  General: Skin is warm and dry.     Capillary Refill: Capillary refill takes less than 2 seconds.     Findings: No ecchymosis, erythema, rash or wound.  Neurological:     General: No focal deficit present.     Mental Status: She is alert and oriented to person, place, and time.     GCS: GCS eye subscore is 4. GCS verbal subscore is 5. GCS motor subscore is 6.     Cranial Nerves: Cranial nerves 2-12 are intact.     Sensory: Sensation is intact.     Motor: Motor function is intact.     Coordination: Coordination is intact.  Psychiatric:        Attention and Perception: Attention normal.        Mood and Affect: Mood normal.        Speech: Speech normal.        Behavior: Behavior normal.     ED Results / Procedures / Treatments   Labs (all labs ordered are listed, but  only abnormal results are displayed) Labs Reviewed  CBC - Abnormal; Notable for the following components:      Result Value   RBC 6.26 (*)    MCV 61.7 (*)    MCH 20.3 (*)    RDW 21.3 (*)    All other components within normal limits  LIPASE, BLOOD  COMPREHENSIVE METABOLIC PANEL  URINALYSIS, ROUTINE W REFLEX MICROSCOPIC    EKG None  Radiology DG Abdomen Acute W/Chest  Addendum Date: 05/08/2023   ADDENDUM REPORT: 05/08/2023 05:24 THERE SHOULD ALSO BE AN IMPRESSION #3: 3.  Evidence of chronic Left thyroid goiter. Electronically Signed   By: Odessa Fleming M.D.   On: 05/08/2023 05:24   Result Date: 05/08/2023 CLINICAL DATA:  52 year old female with abdominal pain. Distal transverse colon mass suspected on CT last month. Smoker. EXAM: DG ABDOMEN ACUTE WITH 1 VIEW CHEST COMPARISON:  CT Abdomen and Pelvis 03/30/2023. Chest radiographs 04/06/2022. FINDINGS: Upright and supine views of the abdomen, also upright chest at 0449 hours. Nonobstructed bowel gas pattern, but there remains conspicuous gas and stool in the transverse colon and hepatic flexure, likely upstream of the lesion identified last month. No pneumoperitoneum. Chronic pulmonary interstitial disease, with basilar predominant chronically increased interstitial markings. Emphysema and/or other cystic lung disease also demonstrated at the lung bases by CT last month. No pneumothorax, pleural effusion or acute pulmonary opacity. Mediastinal contours remain within normal limits although chronic deviation of the trachea is noted compatible with left thyroid goiter. No acute or suspicious osseous lesion identified. IMPRESSION: 1. Nonobstructed bowel gas pattern, but large bowel retained gas and stool which could be upstream of the colonic lesions suspected by CT last month. No pneumoperitoneum. 2. Chronic interstitial lung disease. No acute cardiopulmonary abnormality. Electronically Signed: By: Odessa Fleming M.D. On: 05/08/2023 05:20     Procedures Procedures    Medications Ordered in ED Medications  fentaNYL (SUBLIMAZE) injection 50 mcg (50 mcg Intravenous Given 05/08/23 0408)  ondansetron (ZOFRAN) injection 4 mg (4 mg Intravenous Given 05/08/23 0407)  HYDROmorphone (DILAUDID) injection 1 mg (1 mg Intravenous Given 05/08/23 0500)  ondansetron (ZOFRAN) injection 4 mg (4 mg Intravenous Given 05/08/23 0500)    ED Course/ Medical Decision Making/ A&P                                 Medical Decision Making Amount and/or Complexity of Data Reviewed  Labs: ordered. Radiology: ordered.  Risk Prescription drug management.   Differential Diagnosis considered includes, but not limited to: Cholelithiasis; cholecystitis; cholangitis; bowel obstruction; esophagitis; gastritis; peptic ulcer disease; pancreatitis; cardiac.  Patient with known colonic mass presents to the emergency department with persistent pain.  She has not been able to obtain follow-up yet.  Lab work is unremarkable.  X-ray without signs of obstruction.  Ambulatory referral to gastroenterology was placed last month.  She reports that she is waiting for an official appointment.  Will also have her schedule follow-up with [facility] surgery.  At this point there does not appear to be any emergent surgical findings and will be appropriate for discharge with refill of her analgesia and antiemetics.        Final Clinical Impression(s) / ED Diagnoses Final diagnoses:  Generalized abdominal pain  Colonic mass    Rx / DC Orders ED Discharge Orders          Ordered    oxyCODONE (ROXICODONE) 5 MG immediate release tablet  Every 4 hours PRN        05/08/23 0651    ondansetron (ZOFRAN-ODT) 4 MG disintegrating tablet        05/08/23 0651    promethazine (PHENERGAN) 25 MG suppository  Every 6 hours PRN        05/08/23 0651              Gilda Crease, MD 05/08/23 [phone]

## 2023-05-09 ENCOUNTER — Telehealth: Payer: Self-pay | Admitting: Gastroenterology

## 2023-05-09 ENCOUNTER — Ambulatory Visit: Payer: Commercial Managed Care - PPO

## 2023-05-09 NOTE — Telephone Encounter (Signed)
Good morning Dr. Tomasa Rand,   Supervising Provider 05/09/23 AM    We received a referral for patient for a colonic mass. Patient stated she has no prior gastroenterology history. Please review and advise on scheduling for patient.    Thank you.

## 2023-05-09 NOTE — Telephone Encounter (Signed)
Scheduled OV with patient with Dr. Tomasa Rand for 05/13/23 at 11:30 am. Advised on when/where to go for appointment & mychart message sent as well with address and office number if she needs to reschedule.

## 2023-05-11 ENCOUNTER — Other Ambulatory Visit: Payer: Self-pay

## 2023-05-11 ENCOUNTER — Emergency Department (HOSPITAL_COMMUNITY)
Admission: EM | Admit: 2023-05-11 | Discharge: 2023-05-11 | Disposition: A | Discharge status: Home / Self Care | Payer: Commercial Managed Care - PPO | Attending: Emergency Medicine | Admitting: Emergency Medicine

## 2023-05-11 ENCOUNTER — Emergency Department (HOSPITAL_COMMUNITY): Payer: Commercial Managed Care - PPO

## 2023-05-11 ENCOUNTER — Encounter (HOSPITAL_COMMUNITY): Payer: Self-pay | Admitting: Pharmacy Technician

## 2023-05-11 DIAGNOSIS — K6389 Other specified diseases of intestine: Secondary | ICD-10-CM | POA: Diagnosis not present

## 2023-05-11 DIAGNOSIS — R109 Unspecified abdominal pain: Secondary | ICD-10-CM | POA: Diagnosis present

## 2023-05-11 LAB — COMPREHENSIVE METABOLIC PANEL
ALT: 10 U/L (ref 0–44)
AST: 15 U/L (ref 15–41)
Albumin: 3.4 g/dL — ABNORMAL LOW (ref 3.5–5.0)
Alkaline Phosphatase: 60 U/L (ref 38–126)
Anion gap: 11 (ref 5–15)
BUN: 10 mg/dL (ref 6–20)
CO2: 24 mmol/L (ref 22–32)
Calcium: 9.3 mg/dL (ref 8.9–10.3)
Chloride: 104 mmol/L (ref 98–111)
Creatinine, Ser: 0.66 mg/dL (ref 0.44–1.00)
GFR, Estimated: >60 mL/min (ref >60)
Glucose, Bld: 101 mg/dL — ABNORMAL HIGH (ref 70–99)
Potassium: 3.7 mmol/L (ref 3.5–5.1)
Sodium: 139 mmol/L (ref 135–145)
Total Bilirubin: 0.4 mg/dL (ref 0.3–1.2)
Total Protein: 7.5 g/dL (ref 6.5–8.1)

## 2023-05-11 LAB — CBC
HCT: 36.1 % (ref 36.0–46.0)
Hemoglobin: 12 g/dL (ref 12.0–15.0)
MCH: 20.5 pg — ABNORMAL LOW (ref 26.0–34.0)
MCHC: 33.2 g/dL (ref 30.0–36.0)
MCV: 61.6 fL — ABNORMAL LOW (ref 80.0–100.0)
Platelets: 342 10*3/uL (ref 150–400)
RBC: 5.86 MIL/uL — ABNORMAL HIGH (ref 3.87–5.11)
RDW: 20.9 % — ABNORMAL HIGH (ref 11.5–15.5)
WBC: 9.7 10*3/uL (ref 4.0–10.5)
nRBC: 0 % (ref 0.0–0.2)

## 2023-05-11 LAB — URINALYSIS, ROUTINE W REFLEX MICROSCOPIC
Bilirubin Urine: NEGATIVE
Glucose, UA: NEGATIVE mg/dL
Hgb urine dipstick: NEGATIVE
Ketones, ur: 20 mg/dL — AB
Leukocytes,Ua: NEGATIVE
Nitrite: NEGATIVE
Protein, ur: NEGATIVE mg/dL
Specific Gravity, Urine: >1.046 — ABNORMAL HIGH (ref 1.005–1.030)
pH: 5 (ref 5.0–8.0)

## 2023-05-11 LAB — LIPASE, BLOOD: Lipase: 21 U/L (ref 11–51)

## 2023-05-11 MED ORDER — HYDROMORPHONE HCL 1 MG/ML IJ SOLN
1.0000 mg | Freq: Once | INTRAMUSCULAR | Status: AC
  Administered 2023-05-11: 1 mg via INTRAVENOUS
  Filled 2023-05-11: qty 1

## 2023-05-11 MED ORDER — OXYCODONE HCL 10 MG PO TABS: 10.0000 mg | ORAL_TABLET | Freq: Four times a day (QID) | ORAL | 0 refills | Status: DC | PRN

## 2023-05-11 MED ORDER — POLYETHYLENE GLYCOL 3350 17 G PO PACK: 17.0000 g | PACK | Freq: Two times a day (BID) | ORAL | 0 refills | Status: DC

## 2023-05-11 MED ORDER — IOHEXOL 350 MG/ML SOLN
75.0000 mL | Freq: Once | INTRAVENOUS | Status: AC | PRN
  Administered 2023-05-11: 75 mL via INTRAVENOUS

## 2023-05-11 MED ORDER — ONDANSETRON 8 MG PO TBDP: 8.0000 mg | ORAL_TABLET | Freq: Three times a day (TID) | ORAL | 0 refills | Status: DC | PRN

## 2023-05-11 MED ORDER — ONDANSETRON HCL 4 MG/2ML IJ SOLN
4.0000 mg | Freq: Once | INTRAMUSCULAR | Status: AC
  Administered 2023-05-11: 4 mg via INTRAVENOUS
  Filled 2023-05-11: qty 2

## 2023-05-11 NOTE — Discharge Instructions (Signed)
Take the medication as prescribed to help with your pain and discomfort.  Follow-up with the GI doctors and surgical team for further treatment as we discussed.  Return to the ED as needed for worsening symptoms fever vomiting or other concerns

## 2023-05-11 NOTE — ED Triage Notes (Signed)
Pt here POV with ongoing abdominal pain. States was recently dx with a mass somewhere in her abdomen.

## 2023-05-11 NOTE — ED Provider Notes (Signed)
[place] EMERGENCY DEPARTMENT AT [facility] Provider Note   CSN: [id number] Arrival date & time: 05/11/23  0102     History  Chief Complaint  Patient presents with   Abdominal Pain    Carol Mack is a 52 y.o. female.   Abdominal Pain    Pt presents with persistent abdominal pain.  Pt has been diagnosed with a colon mass.  PT states continue to have pain.  Pt is scheduled to see GI.  No vomiting, but has been nauseated.  NO diarrhea.   Home Medications Prior to Admission medications   Medication Sig Start Date End Date Taking? Authorizing Provider  ondansetron (ZOFRAN-ODT) 8 MG disintegrating tablet Take 1 tablet (8 mg total) by mouth every 8 (eight) hours as needed for nausea or vomiting. 05/11/23  Yes [name], MD  Oxycodone HCl 10 MG TABS Take 1 tablet (10 mg total) by mouth every 6 (six) hours as needed. 05/11/23  Yes [name], MD  polyethylene glycol (MIRALAX) 17 g packet Take 17 g by mouth 2 (two) times daily. 05/11/23  Yes [name], MD  albuterol (VENTOLIN HFA) 108 (90 Base) MCG/ACT inhaler Inhale 1-2 puffs into the lungs every 6 (six) hours as needed for shortness of breath. 12/30/22   Milas Hock, MD  amLODipine-olmesartan (AZOR) 10-40 MG tablet Take 1 tablet by mouth daily. 04/14/23   [name], NP  promethazine (PHENERGAN) 25 MG suppository Place 1 suppository (25 mg total) rectally every 6 (six) hours as needed for nausea or vomiting. 05/08/23   Gilda Crease, MD      Allergies    Percocet [oxycodone-acetaminophen]    Review of Systems   Review of Systems  Gastrointestinal:  Positive for abdominal pain.    Physical Exam Updated Vital Signs BP 133/87 (BP Location: Right Arm)   Pulse 87   Temp 98.5 F (36.9 C) (Oral)   Resp 16   LMP  (LMP Unknown)   SpO2 100%  Physical Exam Vitals and nursing note reviewed.  Constitutional:      General: She is not in acute distress.    Appearance: She is well-developed.  HENT:      Head: Normocephalic and atraumatic.     Right Ear: External ear normal.     Left Ear: External ear normal.  Eyes:     General: No scleral icterus.       Right eye: No discharge.        Left eye: No discharge.     Conjunctiva/sclera: Conjunctivae normal.  Neck:     Trachea: No tracheal deviation.  Cardiovascular:     Rate and Rhythm: Normal rate and regular rhythm.  Pulmonary:     Effort: Pulmonary effort is normal. No respiratory distress.     Breath sounds: Normal breath sounds. No stridor. No wheezing or rales.  Abdominal:     General: Bowel sounds are normal. There is no distension.     Palpations: Abdomen is soft.     Tenderness: There is no abdominal tenderness. There is no guarding or rebound.  Musculoskeletal:        General: No tenderness or deformity.     Cervical back: Neck supple.  Skin:    General: Skin is warm and dry.     Findings: No rash.  Neurological:     General: No focal deficit present.     Mental Status: She is alert.     Cranial Nerves: No cranial nerve deficit, dysarthria or  facial asymmetry.     Sensory: No sensory deficit.     Motor: No abnormal muscle tone or seizure activity.     Coordination: Coordination normal.  Psychiatric:        Mood and Affect: Mood normal.     ED Results / Procedures / Treatments   Labs (all labs ordered are listed, but only abnormal results are displayed) Labs Reviewed  COMPREHENSIVE METABOLIC PANEL - Abnormal; Notable for the following components:      Result Value   Glucose, Bld 101 (*)    Albumin 3.4 (*)    All other components within normal limits  CBC - Abnormal; Notable for the following components:   RBC 5.86 (*)    MCV 61.6 (*)    MCH 20.5 (*)    RDW 20.9 (*)    All other components within normal limits  URINALYSIS, ROUTINE W REFLEX MICROSCOPIC - Abnormal; Notable for the following components:   APPearance HAZY (*)    Specific Gravity, Urine >1.046 (*)    Ketones, ur 20 (*)    All other  components within normal limits  LIPASE, BLOOD    EKG None  Radiology CT ABDOMEN PELVIS W CONTRAST  Result Date: 05/11/2023 CLINICAL DATA:  Acute abdominal pain.  Transverse colon mass. * Tracking Code: BO * EXAM: CT ABDOMEN AND PELVIS WITH CONTRAST TECHNIQUE: Multidetector CT imaging of the abdomen and pelvis was performed using the standard protocol following bolus administration of intravenous contrast. RADIATION DOSE REDUCTION: This exam was performed according to the departmental dose-optimization program which includes automated exposure control, adjustment of the mA and/or kV according to patient size and/or use of iterative reconstruction technique. CONTRAST:  75mL OMNIPAQUE IOHEXOL 350 MG/ML SOLN COMPARISON:  03/30/2023 FINDINGS: Lower chest: Centrilobular emphysema. Cutaneous lesion along the left inferomedial breast, 1.1 by 1.0 cm on image 4 series 3. Hepatobiliary: Indistinct but stable 6 mm lesion in segment 2 of the liver on image 10 series 3 common not readily appreciable on 06/09/2019, technically nonspecific. Pancreas: Unremarkable Spleen: Unremarkable Adrenals/Urinary Tract: 2.7 by 2.0 cm simple cyst of the right mid kidney posterolaterally on image 26 series 3. No further imaging workup of this lesion is indicated. Urinary bladder unremarkable.  Adrenal glands appear normal. Stomach/Bowel: Stable circumferential mass of the distal transverse colon measures about 3.3 cm in length on image 39 of series 3 and raises concern for colon cancer. There is prominence of gas in the colon leading up to this lesion but also some mild prominence of stool in the colon just past this lesion. Lymph node or local extension from the mass cephalad measures about 1.1 by 0.8 cm on image 36 series 3. Vascular/Lymphatic: Small retrocrural lymph nodes are present. Small periaortic lymph nodes are present. Indistinctness of tissue planes along the porta hepatis possibly with some indistinct lymph nodes in this  vicinity but no overtly pathologic adenopathy. Atherosclerosis is present, including aortoiliac atherosclerotic disease. Reproductive: Uterus absent. Ovaries unremarkable. Chronic left labial density currently 0.2 by 2.7 cm, probably a Bartholin cyst. Other: Stable trace fluid along the right paracolic gutter, image 58 series 6. Musculoskeletal: Schmorl's nodes along the superior endplates of L5 and S1. Reactive sclerosis around the Schmorl's node along the superior endplate of L5. Mild lower lumbar degenerative disc disease. Small supraumbilical hernia contains adipose tissue and trace fluid on image 83 series 7. IMPRESSION: 1. Stable circumferential mass of the distal transverse colon measuring about 3.3 cm in length, high suspicion for colon adenocarcinoma. There is  prominence of gas in the colon leading up to this lesion but also some mild prominence of stool in the colon just past this lesion. 2. Indistinct but stable 6 mm lesion in segment 2 of the liver, technically nonspecific. Metastatic lesion not excluded. 3. Stable trace fluid along the right paracolic gutter. 4. Small supraumbilical hernia contains adipose tissue and trace fluid. 5. Cutaneous lesion along the left inferomedial breast, 1.1 by 1.0 cm. Correlate with physical exam findings, consider diagnostic mammography referral. 6. Centrilobular emphysema. 7. Chronic left labial density, probably a Bartholin cyst. Aortic Atherosclerosis (ICD10-I70.0) and Emphysema (ICD10-J43.9). Electronically Signed   By: Gaylyn Rong M.D.   On: 05/11/2023 12:54    Procedures Procedures    Medications Ordered in ED Medications  HYDROmorphone (DILAUDID) injection 1 mg (has no administration in time range)  ondansetron (ZOFRAN) injection 4 mg (has no administration in time range)  HYDROmorphone (DILAUDID) injection 1 mg (1 mg Intravenous Given 05/11/23 1123)  ondansetron (ZOFRAN) injection 4 mg (4 mg Intravenous Given 05/11/23 1123)  iohexol (OMNIPAQUE)  350 MG/ML injection 75 mL (75 mLs Intravenous Contrast Given 05/11/23 1224)    ED Course/ Medical Decision Making/ A&P Clinical Course as of 05/11/23 1533  Sun May 11, 2023  1453 CT scan shows stable circumferential mass of the distal colon.  There is prominent gas in the colon leading up to the lesion but there is stool distal to colon as well no evidence of obstruction [JK]    Clinical Course User Index [JK] [name], MD                                 Medical Decision Making Problems Addressed: Abdominal pain, unspecified abdominal location: acute illness or injury that poses a threat to life or bodily functions Colonic mass: chronic illness or injury with exacerbation, progression, or side effects of treatment  Amount and/or Complexity of Data Reviewed Labs: ordered. Decision-making details documented in ED Course. Radiology: ordered and independent interpretation performed.  Risk OTC drugs. Prescription drug management.   Patient has known history of a colon mass diagnosed last month.  Patient has been waiting for outpatient follow-up with GI and general surgery.  Patient does have an appointment this coming week.  Patient continues to have pain and nausea.  She had not had a bowel movement recently.  No recent fevers.  Concerned about the possibility of obstruction or perforation associated with her known colon mass.  Laboratory test did not show any acute abnormalities.  CT scan showed persistent findings of her colonic mass but no evidence of perforation or other acute complicating feature.  Patient was treated with IV pain medications.  Will give her prescription for oxycodone as well as Zofran.  Discussed MiraLAX stool softener to help with constipation related to the mass as well as her opiate pain medications        Final Clinical Impression(s) / ED Diagnoses Final diagnoses:  Abdominal pain, unspecified abdominal location  Colonic mass    Rx / DC Orders ED  Discharge Orders          Ordered    Oxycodone HCl 10 MG TABS  Every 6 hours PRN       Note to Pharmacy: Pt has a colon mass/ cancer   05/11/23 1530    ondansetron (ZOFRAN-ODT) 8 MG disintegrating tablet  Every 8 hours PRN        05/11/23 1530  polyethylene glycol (MIRALAX) 17 g packet  2 times daily        05/11/23 1530              [name], MD 05/11/23 [phone]

## 2023-05-13 ENCOUNTER — Encounter: Payer: Self-pay | Admitting: Gastroenterology

## 2023-05-13 ENCOUNTER — Other Ambulatory Visit (INDEPENDENT_AMBULATORY_CARE_PROVIDER_SITE_OTHER): Payer: Commercial Managed Care - PPO

## 2023-05-13 ENCOUNTER — Ambulatory Visit (INDEPENDENT_AMBULATORY_CARE_PROVIDER_SITE_OTHER): Payer: Commercial Managed Care - PPO | Admitting: Gastroenterology

## 2023-05-13 VITALS — BP 118/84 | Wt 122.0 lb

## 2023-05-13 DIAGNOSIS — K6389 Other specified diseases of intestine: Secondary | ICD-10-CM

## 2023-05-13 DIAGNOSIS — R718 Other abnormality of red blood cells: Secondary | ICD-10-CM

## 2023-05-13 LAB — IBC + FERRITIN
Ferritin: 7.4 ng/mL — ABNORMAL LOW (ref 10.0–291.0)
Iron: 24 ug/dL — ABNORMAL LOW (ref 42–145)
Saturation Ratios: 5.5 % — ABNORMAL LOW (ref 20.0–50.0)
TIBC: 436.8 ug/dL (ref 250.0–450.0)
Transferrin: 312 mg/dL (ref 212.0–360.0)

## 2023-05-13 NOTE — Patient Instructions (Signed)
_______________________________________________________  If your blood pressure at your visit was 140/90 or greater, please contact your primary care physician to follow up on this.   If you are age 52 or younger, your body mass index should be between 19-25. Your Body mass index is 20.94 kg/m. If this is out of the aformentioned range listed, please consider follow up with your Primary Care Provider.  ________________________________________________________  The [facility] providers would like to encourage you to use [facility] to communicate with providers for non-urgent requests or questions.  Due to long hold times on the telephone, sending your provider a message by [employer] may be a faster and more efficient way to get a response.  Please allow 48 business hours for a response.  Please remember that this is for non-urgent requests.  _______________________________________________________  We have sent the following medications to your pharmacy for you to pick up at your convenience:  Miralax daily-can increase as needed to achieve 1 soft stool daily.  Your provider has requested that you go to the basement level for lab work before leaving today. Press "B" on the elevator. The lab is located at the first door on the left as you exit the elevator.  You have been scheduled for a colonoscopy. Please follow written instructions given to you at your visit today.   Please pick up your prep supplies at the pharmacy within the next 1-3 days.  If you use inhalers (even only as needed), please bring them with you on the day of your procedure.  DO NOT TAKE 7 DAYS PRIOR TO TEST- Trulicity (dulaglutide) Ozempic, Wegovy (semaglutide) Mounjaro (tirzepatide) Bydureon Bcise (exanatide extended release)  DO NOT TAKE 1 DAY PRIOR TO YOUR TEST Rybelsus (semaglutide) Adlyxin (lixisenatide) Victoza (liraglutide) Byetta  (exanatide) ___________________________________________________________________________  You have been scheduled for a CT scan of the chest at [facility], 1st floor Radiology. You are scheduled on 05-14-23 at 1030am. You should arrive 15 minutes prior to your appointment time for registration.    If you have any questions regarding your exam or if you need to reschedule, you may call [facility] at [phone] between the hours of 8:00 am and 5:00 pm, Monday-Friday.   Due to recent changes in healthcare laws, you may see the results of your imaging and laboratory studies on MyChart before your provider has had a chance to review them.  We understand that in some cases there may be results that are confusing or concerning to you. Not all laboratory results come back in the same time frame and the provider may be waiting for multiple results in order to interpret others.  Please give [place] 48 hours in order for your provider to thoroughly review all the results before contacting the office for clarification of your results.   Thank you for entrusting me with your care and choosing [facility].  Dr Tomasa Rand

## 2023-05-13 NOTE — Progress Notes (Signed)
HPI : Carol Mack is a 52 y.o. female who is referred to [place] by [name], NP for a colon mass found on CT.   The patient states that she was in her usual state of health and still she started having issues with abdominal pain 2 or 3 months ago.  She went to the emergency room on September 15, the day after the pain started.  A CT obtained at that ER visit showed a finding concerning for colon cancer.  A referral to GI was reportedly placed at that time.  She returned to the ED 2 days later with ongoing abdominal pain.  She was given some oxycodone to help with pain. She then saw her PCP on September 30 who placed a referral to gastroenterology (referral was placed as routine). She reported to the emergency department again October 24 with ongoing constant abdominal pain and poor p.o. intake.  She was given refills of her analgesia and antiemetic therapy.   On October 25, I received a message from our schedulers that this patient had a referral for a colonic mass.  The patient was then given this clinic appointment outside of our template for expedited further evaluation of colon mass. She reported to the ED again on October 27 with same symptoms and a CT scan again demonstrated a suspected distal transverse colon mass with luminal narrowing and prominence of colonic gas proximal to this lesion, but without evidence of obstruction.  There was a indistinct but stable 6 mm lesion in the liver, possibly representing metastasis and some small lymph nodes in the porta hepatis.  Today, the patient reports having ongoing issues with abdominal pain.  She has very poor appetite and gets nauseated every time she eats.  She frequently retches.  She has lost 15 pounds in the past 6 weeks.  She reports infrequent bowel movements, cannot tell me when her last bowel movement was.  She was given MiraLAX in the ED yesterday.  Previously, she had no problems with constipation or abdominal pain.  She is not  having any diarrhea or blood in her stool.  She has never had a colonoscopy or any other colon cancer screening test. She was referred for a screening colonoscopy in February of this year, but did not schedule her previsit appointment. She has no family history of colon cancer.  She has no known cardiopulmonary comorbidities and denies any concerning symptoms such has chest pain/pressure, shortness of breath, light-headedness/dizziness/syncope, palpitations. She does have a history of smoking, but has recently stopped.   CT Abdomen/Pelvis May 11, 2023 IMPRESSION: 1. Stable circumferential mass of the distal transverse colon measuring about 3.3 cm in length, high suspicion for colon adenocarcinoma. There is prominence of gas in the colon leading up to this lesion but also some mild prominence of stool in the colon just past this lesion. 2. Indistinct but stable 6 mm lesion in segment 2 of the liver, technically nonspecific. Metastatic lesion not excluded. 3. Stable trace fluid along the right paracolic gutter. 4. Small supraumbilical hernia contains adipose tissue and trace fluid. 5. Cutaneous lesion along the left inferomedial breast, 1.1 by 1.0 cm. Correlate with physical exam findings, consider diagnostic mammography referral. 6. Centrilobular emphysema. 7. Chronic left labial density, probably a Bartholin cyst.    CT Abdomen/Pelvis Sept 15, 2024 IMPRESSION: 1. Irregular wall thickening with luminal narrowing of the left transverse colon spanning 4.5 cm suspicious for colonic malignancy. Follow-up colonoscopy recommended. 2. Small amount of ascites. 3. No  other evidence of an acute or recent abnormality within the abdomen or pelvis. 4. Left vulvar region cyst consistent with a Bartholin's gland cyst similar to the previous CT.  Past Medical History:  Diagnosis Date   Asthma    Bartholin cyst    Chiari malformation type I (HCC)    Hernia, inguinal    Hernia, inguinal     Hypertension      Past Surgical History:  Procedure Laterality Date   fibroid freezing     unsure   IR GENERIC HISTORICAL  07/27/2014   IR RADIOLOGIST EVAL & MGMT 07/27/2014 [name], MD [facility] INTERV RAD   ROBOTIC ASSISTED TOTAL HYSTERECTOMY N/A 09/15/2018   Procedure: XI ROBOTIC ASSISTED TOTAL HYSTERECTOMY WITH SALPINGECTOMY;  Surgeon: Willodean Rosenthal, MD;  Location: [facility] [place];  Service: Gynecology;  Laterality: N/A;   Family History  Problem Relation Age of Onset   Thyroid disease Mother        large benign goiter   Social History   Tobacco Use   Smoking status: Every Day    Current packs/day: 1.00    Average packs/day: 1 pack/day for 28.8 years (28.8 ttl pk-yrs)    Types: Cigarettes    Start date: 1996    Passive exposure: Current   Smokeless tobacco: Never  Vaping Use   Vaping status: Never Used  Substance Use Topics   Alcohol use: No   Drug use: No   Current Outpatient Medications  Medication Sig Dispense Refill   amLODipine-olmesartan (AZOR) 10-40 MG tablet Take 1 tablet by mouth daily. 90 tablet 3   ondansetron (ZOFRAN-ODT) 8 MG disintegrating tablet Take 1 tablet (8 mg total) by mouth every 8 (eight) hours as needed for nausea or vomiting. 12 tablet 0   Oxycodone HCl 10 MG TABS Take 1 tablet (10 mg total) by mouth every 6 (six) hours as needed. 20 tablet 0   polyethylene glycol (MIRALAX) 17 g packet Take 17 g by mouth 2 (two) times daily. 30 each 0   promethazine (PHENERGAN) 25 MG suppository Place 1 suppository (25 mg total) rectally every 6 (six) hours as needed for nausea or vomiting. 12 each 0   albuterol (VENTOLIN HFA) 108 (90 Base) MCG/ACT inhaler Inhale 1-2 puffs into the lungs every 6 (six) hours as needed for shortness of breath. (Patient not taking: Reported on 05/13/2023) 2 each 1   No current facility-administered medications for this visit.   Allergies  Allergen Reactions   Percocet [Oxycodone-Acetaminophen] Anxiety and  Other (See Comments)    Made the patient feel "jittery"     Review of Systems: All systems reviewed and negative except where noted in HPI.    CT ABDOMEN PELVIS W CONTRAST  Result Date: 05/11/2023 CLINICAL DATA:  Acute abdominal pain.  Transverse colon mass. * Tracking Code: BO * EXAM: CT ABDOMEN AND PELVIS WITH CONTRAST TECHNIQUE: Multidetector CT imaging of the abdomen and pelvis was performed using the standard protocol following bolus administration of intravenous contrast. RADIATION DOSE REDUCTION: This exam was performed according to the departmental dose-optimization program which includes automated exposure control, adjustment of the mA and/or kV according to patient size and/or use of iterative reconstruction technique. CONTRAST:  75mL OMNIPAQUE IOHEXOL 350 MG/ML SOLN COMPARISON:  03/30/2023 FINDINGS: Lower chest: Centrilobular emphysema. Cutaneous lesion along the left inferomedial breast, 1.1 by 1.0 cm on image 4 series 3. Hepatobiliary: Indistinct but stable 6 mm lesion in segment 2 of the liver on image 10 series 3 common not readily  appreciable on 06/09/2019, technically nonspecific. Pancreas: Unremarkable Spleen: Unremarkable Adrenals/Urinary Tract: 2.7 by 2.0 cm simple cyst of the right mid kidney posterolaterally on image 26 series 3. No further imaging workup of this lesion is indicated. Urinary bladder unremarkable.  Adrenal glands appear normal. Stomach/Bowel: Stable circumferential mass of the distal transverse colon measures about 3.3 cm in length on image 39 of series 3 and raises concern for colon cancer. There is prominence of gas in the colon leading up to this lesion but also some mild prominence of stool in the colon just past this lesion. Lymph node or local extension from the mass cephalad measures about 1.1 by 0.8 cm on image 36 series 3. Vascular/Lymphatic: Small retrocrural lymph nodes are present. Small periaortic lymph nodes are present. Indistinctness of tissue planes  along the porta hepatis possibly with some indistinct lymph nodes in this vicinity but no overtly pathologic adenopathy. Atherosclerosis is present, including aortoiliac atherosclerotic disease. Reproductive: Uterus absent. Ovaries unremarkable. Chronic left labial density currently 0.2 by 2.7 cm, probably a Bartholin cyst. Other: Stable trace fluid along the right paracolic gutter, image 58 series 6. Musculoskeletal: Schmorl's nodes along the superior endplates of L5 and S1. Reactive sclerosis around the Schmorl's node along the superior endplate of L5. Mild lower lumbar degenerative disc disease. Small supraumbilical hernia contains adipose tissue and trace fluid on image 83 series 7. IMPRESSION: 1. Stable circumferential mass of the distal transverse colon measuring about 3.3 cm in length, high suspicion for colon adenocarcinoma. There is prominence of gas in the colon leading up to this lesion but also some mild prominence of stool in the colon just past this lesion. 2. Indistinct but stable 6 mm lesion in segment 2 of the liver, technically nonspecific. Metastatic lesion not excluded. 3. Stable trace fluid along the right paracolic gutter. 4. Small supraumbilical hernia contains adipose tissue and trace fluid. 5. Cutaneous lesion along the left inferomedial breast, 1.1 by 1.0 cm. Correlate with physical exam findings, consider diagnostic mammography referral. 6. Centrilobular emphysema. 7. Chronic left labial density, probably a Bartholin cyst. Aortic Atherosclerosis (ICD10-I70.0) and Emphysema (ICD10-J43.9). Electronically Signed   By: Gaylyn Rong M.D.   On: 05/11/2023 12:54   DG Abdomen Acute W/Chest  Addendum Date: 05/08/2023   ADDENDUM REPORT: 05/08/2023 05:24 THERE SHOULD ALSO BE AN IMPRESSION #3: 3.  Evidence of chronic Left thyroid goiter. Electronically Signed   By: Odessa Fleming M.D.   On: 05/08/2023 05:24   Result Date: 05/08/2023 CLINICAL DATA:  52 year old female with abdominal pain. Distal  transverse colon mass suspected on CT last month. Smoker. EXAM: DG ABDOMEN ACUTE WITH 1 VIEW CHEST COMPARISON:  CT Abdomen and Pelvis 03/30/2023. Chest radiographs 04/06/2022. FINDINGS: Upright and supine views of the abdomen, also upright chest at 0449 hours. Nonobstructed bowel gas pattern, but there remains conspicuous gas and stool in the transverse colon and hepatic flexure, likely upstream of the lesion identified last month. No pneumoperitoneum. Chronic pulmonary interstitial disease, with basilar predominant chronically increased interstitial markings. Emphysema and/or other cystic lung disease also demonstrated at the lung bases by CT last month. No pneumothorax, pleural effusion or acute pulmonary opacity. Mediastinal contours remain within normal limits although chronic deviation of the trachea is noted compatible with left thyroid goiter. No acute or suspicious osseous lesion identified. IMPRESSION: 1. Nonobstructed bowel gas pattern, but large bowel retained gas and stool which could be upstream of the colonic lesions suspected by CT last month. No pneumoperitoneum. 2. Chronic interstitial lung disease. No acute  cardiopulmonary abnormality. Electronically Signed: By: Odessa Fleming M.D. On: 05/08/2023 05:20    Physical Exam: BP 118/84   Wt 122 lb (55.3 kg)   LMP  (LMP Unknown)   BMI 20.94 kg/m  Constitutional: Pleasant,well-developed, African-American female in no acute distress.  Accompanied by sister and daughter HEENT: Normocephalic and atraumatic. Conjunctivae are normal. No scleral icterus. Neck supple.  Cardiovascular: Normal rate, regular rhythm.  Pulmonary/chest: Effort normal and breath sounds normal. No wheezing, rales or rhonchi. Abdominal: Soft, not distended or tense, mild tenderness to palpation in the periumbilical region, no rigidity or guarding. Bowel sounds active throughout.  Bowel sounds not high-pitched or tinkling. There are no masses palpable. No hepatomegaly. Extremities:  no edema Neurological: Alert and oriented to person place and time. Skin: Skin is warm and dry. No rashes noted. Psychiatric: Normal mood and affect. Behavior is normal.  CBC    Component Value Date/Time   WBC 9.7 05/11/2023 1009   RBC 5.86 (H) 05/11/2023 1009   HGB 12.0 05/11/2023 1009   HCT 36.1 05/11/2023 1009   PLT 342 05/11/2023 1009   MCV 61.6 (L) 05/11/2023 1009   MCH 20.5 (L) 05/11/2023 1009   MCHC 33.2 05/11/2023 1009   RDW 20.9 (H) 05/11/2023 1009   LYMPHSABS 2.3 04/01/2023 1243   MONOABS 0.8 04/01/2023 1243   EOSABS 0.1 04/01/2023 1243   BASOSABS 0.0 04/01/2023 1243    CMP     Component Value Date/Time   NA 139 05/11/2023 1009   K 3.7 05/11/2023 1009   CL 104 05/11/2023 1009   CO2 24 05/11/2023 1009   GLUCOSE 101 (H) 05/11/2023 1009   BUN 10 05/11/2023 1009   CREATININE 0.66 05/11/2023 1009   CALCIUM 9.3 05/11/2023 1009   PROT 7.5 05/11/2023 1009   ALBUMIN 3.4 (L) 05/11/2023 1009   AST 15 05/11/2023 1009   ALT 10 05/11/2023 1009   ALKPHOS 60 05/11/2023 1009   BILITOT 0.4 05/11/2023 1009   GFRNONAA >60 05/11/2023 1009   GFRAA >60 06/09/2019 1613       Latest Ref Rng & Units 05/11/2023   10:09 AM 05/08/2023    4:01 AM 04/01/2023   12:43 PM  CBC EXTENDED  WBC 4.0 - 10.5 K/uL 9.7  9.3  8.2   RBC 3.87 - 5.11 MIL/uL 5.86  6.26  5.64   Hemoglobin 12.0 - 15.0 g/dL 16.1  09.6  04.5   HCT 36.0 - 46.0 % 36.1  38.6  35.9   Platelets 150 - 400 K/uL 342  390  332   NEUT# 1.7 - 7.7 K/uL   4.9   Lymph# 0.7 - 4.0 K/uL   2.3       ASSESSMENT AND PLAN: 52 year old female with abrupt onset abdominal pain in September, found to have distal colon mass.  A GI referral was placed around that time, but was placed as routine, so was not urgently triaged.  When I became aware of the referral on October 25, she was scheduled for this appointment today.  Her CT scan October 27 shows some degree of luminal stenosis and partial obstruction, as evidenced by the prominence of  gas proximal to the lesion.  Her physical exam is reassuring. This patient needs an urgent colonoscopy to establish a diagnosis of colon cancer, so that she can receive treatment before possible obstruction. I think she would be able to tolerate a bowel prep.  We will add her on my schedule this week for 730 slot.  Hopefully, she  can be presented at multidisciplinary conference next Wednesday and get scheduled for an expedited surgery. Will obtain chest CT to complete staging and get baseline CEA. She has a severe microcytosis, which I suspect is primarily related to a hemoglobinopathy, but likely has some degree of iron deficiency as well.  We will get an iron panel today. Patient is requesting documentation for work, as she has been unable to work for the past several weeks.  I told her that I would fill out her FMLA forms if she can provide the necessary paperwork. We discussed the importance of maintaining soft stools.  MiraLAX is the best way to do this, and she may need to increase/titrate up her dose as needed to achieve at least 1-2 soft stools per day.  We discussed the importance of nutrition and the need for getting lots of dense calories in liquid form such as protein shakes.   Colon mass -Expedited colonoscopy (August 31, 730) - CEA - CT chest  Microcytosis - Iron panel  The details, risks (including bleeding, perforation, infection, missed lesions, medication reactions and possible hospitalization or surgery if complications occur), benefits, and alternatives to colonoscopy with possible biopsy and possible polypectomy were discussed with the patient and she consents to proceed.   Kaeli Nichelson E. Tomasa Rand, MD [facility]  [name], NP

## 2023-05-14 ENCOUNTER — Ambulatory Visit (HOSPITAL_COMMUNITY)
Admission: RE | Admit: 2023-05-14 | Discharge: 2023-05-14 | Disposition: A | Discharge status: Home / Self Care | Payer: Commercial Managed Care - PPO | Source: Ambulatory Visit | Attending: Gastroenterology | Admitting: Gastroenterology

## 2023-05-14 ENCOUNTER — Ambulatory Visit: Payer: Commercial Managed Care - PPO

## 2023-05-14 DIAGNOSIS — K6389 Other specified diseases of intestine: Secondary | ICD-10-CM | POA: Insufficient documentation

## 2023-05-14 LAB — CEA: CEA: 14.4 ng/mL — ABNORMAL HIGH

## 2023-05-15 ENCOUNTER — Encounter: Payer: Self-pay | Admitting: Gastroenterology

## 2023-05-15 ENCOUNTER — Ambulatory Visit: Payer: Commercial Managed Care - PPO | Admitting: Gastroenterology

## 2023-05-15 ENCOUNTER — Telehealth: Payer: Self-pay | Admitting: *Deleted

## 2023-05-15 VITALS — BP 131/81 | HR 83 | Temp 97.6°F | Resp 19 | Ht 64.0 in | Wt 122.0 lb

## 2023-05-15 DIAGNOSIS — K6389 Other specified diseases of intestine: Secondary | ICD-10-CM

## 2023-05-15 DIAGNOSIS — C184 Malignant neoplasm of transverse colon: Secondary | ICD-10-CM

## 2023-05-15 MED ORDER — SODIUM CHLORIDE 0.9 % IV SOLN: 500.0000 mL | Freq: Once | INTRAVENOUS | Status: DC

## 2023-05-15 NOTE — Progress Notes (Signed)
Called to room to assist during endoscopic procedure.  Patient ID and intended procedure confirmed with present staff. Received instructions for my participation in the procedure from the performing physician.  

## 2023-05-15 NOTE — Telephone Encounter (Signed)
-----   Message from [name] sent at 05/15/2023  2:56 PM EDT ----- Regarding: Surgery, oncology consult Bonita Quin,  Can you please place an ASAP consult to colorectal surgery (Dr. Cliffton Asters) for nearly obstructive colon cancer? Also place oncology consult but she will likely not need to see them until after her surgery.

## 2023-05-15 NOTE — Progress Notes (Signed)
History and Physical Interval Note:  05/15/2023 7:19 AM  Carol Mack  has presented today for endoscopic procedure(s), with the diagnosis of  Encounter Diagnosis  Name Primary?   Colonic mass Yes  .  The various methods of evaluation and treatment have been discussed with the patient and/or family. After consideration of risks, benefits and other options for treatment, the patient has consented to  the endoscopic procedure(s).   The patient's history has been reviewed, patient examined, no change in status, stable for endoscopic procedure(s).  I have reviewed the patient's chart and labs.  Questions were answered to the patient's satisfaction.     Rykar Lebleu E. Tomasa Rand, MD [facility]

## 2023-05-15 NOTE — Op Note (Addendum)
[facility] Patient Name: Carol Mack Procedure Date: 05/15/2023 7:31 AM MRN: [id number] Endoscopist: Lorin Picket E. Tomasa Rand , MD, [id number] Age: 52 Referring MD:  Date of Birth: 1971/03/14 Gender: Female Account #: [id number] Procedure:                Colonoscopy Indications:              Generalized abdominal pain, Abnormal CT of the GI                            tract, Weight loss. High suspicion for colon cancer. Medicines:                Monitored Anesthesia Care Procedure:                Pre-Anesthesia Assessment:                           - Prior to the procedure, a History and Physical                            was performed, and patient medications and                            allergies were reviewed. The patient's tolerance of                            previous anesthesia was also reviewed. The risks                            and benefits of the procedure and the sedation                            options and risks were discussed with the patient.                            All questions were answered, and informed consent                            was obtained. Prior Anticoagulants: The patient has                            taken no anticoagulant or antiplatelet agents. ASA                            Grade Assessment: III - A patient with severe                            systemic disease. After reviewing the risks and                            benefits, the patient was deemed in satisfactory                            condition to undergo the procedure.  After obtaining informed consent, the colonoscope                            was passed under direct vision. Throughout the                            procedure, the patient's blood pressure, pulse, and                            oxygen saturations were monitored continuously.The                            colonoscopy was performed with difficulty due to a                             partially obstructing mass, poor bowel prep and                            significant looping. Successful completion of the                            procedure was aided by using manual pressure and                            withdrawing the scope and replacing with the                            UltraSlim scope. The patient tolerated the                            procedure well. The quality of the bowel                            preparation was poor. The rectum was photographed.                            The PCF-H190TL Slim SN [id number] was introduced                            through the and advanced to the. Scope In: 7:59:02 AM Scope Out: 8:15:48 AM Total Procedure Duration: 0 hours 16 minutes 46 seconds  Findings:                 The perianal and digital rectal examinations were                            normal.                           An ulcerated partially obstructing mass was found                            in the distal transverse colon. The mass was  circumferential. The mass measured approximately                            three cm in length, although it is not certain the                            entirety of the mass was visualized, as it was not                            traversed. Its inner diameter measured about 5-6                            mm. The mass was not able to be traversed with the                            pediatric colonoscope. The pediatric colonoscope                            was withdrawn and replaced with an ultraslim                            colonoscope, but again was not able to be                            traversed. No bleeding was present, but the lesion                            was quite friable. Eight biopsies were taken with a                            cold forceps for histology. Estimated blood loss                            was minimal. The mucosa just distal to the mass was                             tattooed with an injection of 2 mL of Spot (carbon                            black). Estimated blood loss was minimal.                           The exam was otherwise normal throughout the                            examined colon.                           The retroflexed view of the distal rectum and anal                            verge was normal and showed no anal or rectal  abnormalities. Complications:            No immediate complications. Estimated Blood Loss:     Estimated blood loss was minimal. Impression:               - Preparation of the colon was poor.                           - Malignant partially obstructing tumor in the                            distal transverse colon. Biopsied. Tattooed.                           - The distal rectum and anal verge are normal on                            retroflexion view. Recommendation:           - Patient has a contact number available for                            emergencies. The signs and symptoms of potential                            delayed complications were discussed with the                            patient. Return to normal activities tomorrow.                            Written discharge instructions were provided to the                            patient.                           - Low residue diet.                           - Continue present medications. Take MiraLax every                            day, increase frequency as needed to acheive a                            daily soft bowel movement.                           - Await pathology results.                           - Refer to a colo-rectal surgeon at appointment to                            be scheduled.                           -  Refer to oncology.                           - CT chest and CEA have already been ordered.                           - Patient will be presented at multi-disciplinary                             conference next Wednesday. Vong Garringer E. Tomasa Rand, MD 05/15/2023 8:25:46 AM This report has been signed electronically.

## 2023-05-15 NOTE — Progress Notes (Signed)
Did not reach cecum due to obstruction of transverse colon by mass

## 2023-05-15 NOTE — Telephone Encounter (Signed)
Patient was previously scheduled to see Dr [name] on 06/02/23. I have contacted [facility] to advise that patient has a nearly obstructing colon cancer and to request urgent referral. Patient has been rescheduled to see Dr Maisie Fus on Wednesday, 05/21/23 at 9:00 am.   I have spoken to patient to advise her of date/time/location of upcoming appointment with Dr Maisie Fus and she verbalizes understanding.  Patient also requests a work note for today. Will provide this thru mychart as she did have her procedure today.

## 2023-05-15 NOTE — Patient Instructions (Addendum)
-   Low residue diet. - Continue present medications. Take MiraLax every day, increase frequency as needed to acheive a daily soft bowel movement. - Await pathology results. - Refer to a colo-rectal surgeon at appointment to be scheduled. - Refer to oncology. - CT chest and CEA have already been ordered. - Patient will be presented at multi-disciplinary conference next Wednesday.   YOU HAD AN ENDOSCOPIC PROCEDURE TODAY AT THE [facility]:   Refer to the procedure report that was given to you for any specific questions about what was found during the examination.  If the procedure report does not answer your questions, please call your gastroenterologist to clarify.  If you requested that your care partner not be given the details of your procedure findings, then the procedure report has been included in a sealed envelope for you to review at your convenience later.  YOU SHOULD EXPECT: Some feelings of bloating in the abdomen. Passage of more gas than usual.  Walking can help get rid of the air that was put into your GI tract during the procedure and reduce the bloating. If you had a lower endoscopy (such as a colonoscopy or flexible sigmoidoscopy) you may notice spotting of blood in your stool or on the toilet paper. If you underwent a bowel prep for your procedure, you may not have a normal bowel movement for a few days.  Please Note:  You might notice some irritation and congestion in your nose or some drainage.  This is from the oxygen used during your procedure.  There is no need for concern and it should clear up in a day or so.  SYMPTOMS TO REPORT IMMEDIATELY:  Following lower endoscopy (colonoscopy or flexible sigmoidoscopy):  Excessive amounts of blood in the stool  Significant tenderness or worsening of abdominal pains  Swelling of the abdomen that is new, acute  Fever of 100F or higher  For urgent or emergent issues, a gastroenterologist can be reached at any hour by  calling [phone]. Do not use MyChart messaging for urgent concerns.    DIET:  We do recommend a small meal at first, but then you may proceed to your regular diet.  Drink plenty of fluids but you should avoid alcoholic beverages for 24 hours.  ACTIVITY:  You should plan to take it easy for the rest of today and you should NOT DRIVE or use heavy machinery until tomorrow (because of the sedation medicines used during the test).    FOLLOW UP: Our staff will call the number listed on your records the next business day following your procedure.  We will call around 7:15- 8:00 am to check on you and address any questions or concerns that you may have regarding the information given to you following your procedure. If we do not reach you, we will leave a message.     If any biopsies were taken you will be contacted by phone or by letter within the next 1-3 weeks.  Please call us at [phone] if you have not heard about the biopsies in 3 weeks.    SIGNATURES/CONFIDENTIALITY: You and/or your care partner have signed paperwork which will be entered into your electronic medical record.  These signatures attest to the fact that that the information above on your After Visit Summary has been reviewed and is understood.  Full responsibility of the confidentiality of this discharge information lies with you and/or your care-partner.

## 2023-05-15 NOTE — Progress Notes (Signed)
Vss nad trans to pacu 

## 2023-05-16 ENCOUNTER — Telehealth: Payer: Self-pay | Admitting: *Deleted

## 2023-05-16 LAB — SURGICAL PATHOLOGY

## 2023-05-16 NOTE — Telephone Encounter (Signed)
  Follow up Call-     05/15/2023    7:17 AM  Call back number  Post procedure Call Back phone  # [phone]  Permission to leave phone message Yes     Patient questions:  Do you have a fever, pain , or abdominal swelling? No. Pain Score  0 *  Have you tolerated food without any problems? Yes.    Have you been able to return to your normal activities? Yes.    Do you have any questions about your discharge instructions: Diet   No. Medications  No. Follow up visit  No.  Do you have questions or concerns about your Care? Yes.  Pt staes that she needs a note for the "whole week to be off."  Told to call office.  Actions: * If pain score is 4 or above: No action needed, pain <4.

## 2023-05-17 NOTE — Progress Notes (Signed)
Carol Mack,  Your biopsies confirmed the suspicion of colon cancer.  I tried calling to review this over the phone, but wasn't able to reach you. I understand you have an appointment with the surgeon (Dr. Maisie Fus) on Wednesday.  It is very important that you make this appointment.  Your surgeon and your oncologist will be managing your colon cancer treatment going forward.

## 2023-05-21 ENCOUNTER — Other Ambulatory Visit: Payer: Self-pay

## 2023-05-21 ENCOUNTER — Telehealth: Payer: Self-pay

## 2023-05-21 ENCOUNTER — Other Ambulatory Visit: Payer: Self-pay | Admitting: *Deleted

## 2023-05-21 ENCOUNTER — Ambulatory Visit: Payer: Self-pay | Admitting: General Surgery

## 2023-05-21 DIAGNOSIS — K769 Liver disease, unspecified: Secondary | ICD-10-CM

## 2023-05-21 NOTE — Telephone Encounter (Signed)
MR liver order as requested and rad scheduling to contact pt regarding setting up appt.

## 2023-05-21 NOTE — Progress Notes (Signed)
The proposed treatment discussed in conference is for discussion purpose only and is not a binding recommendation.  The patients have not been physically examined, or presented with their treatment options.  Therefore, final treatment plans cannot be decided.  

## 2023-05-21 NOTE — Telephone Encounter (Signed)
-----   Message from [name] sent at 05/21/2023 12:42 PM EST ----- Regarding: MRI liver Carol Mack,  This patient was discussed at tumor board this morning and it was recommended that we get an MRI of the liver to further characterize the density seen on her CT scan.  Can you please order an MRI of the liver with and without contrast?

## 2023-05-21 NOTE — H&P (Signed)
REFERRING PHYSICIAN:  Room, Emergency  PROVIDER:  Elenora Gamma, MD  MRN: [id number] DOB: [birth year] DATE OF ENCOUNTER: 05/21/2023  Subjective   Chief Complaint: Colon Cancer     History of Present Illness: Carol Mack is a 52 y.o. female who is seen today as an office consultation at the request of the ER and Dr. Tomasa Rand for evaluation of Colon Cancer .  Patient presented to the ED with abdominal pain in September 2024.  Irregular wall thickening and luminal narrowing of the left transverse colon was identified.  No signs of metastatic disease were noted.  She underwent a colonoscopy on May 15, 2023.  The mass was found in the distal transverse colon.  This was biopsied and tattooed.  Mass was not traversed endoscopically.  Biopsy showed invasive moderately differentiated adenocarcinoma.  CEA level is 14.4.  She reports better bowel habits after the colonoscopy.  She is taking MiraLAX as needed.  Patient has no major medical history.  Surgical history significant for fibroid surgery.  Patient has approximately 20 pound weight loss in the last 6 months.   Review of Systems: A complete review of systems was obtained from the patient.  I have reviewed this information and discussed as appropriate with the patient.  See HPI as well for other ROS.   Medical History: Past Medical History:  Diagnosis Date   Asthma, unspecified asthma severity, unspecified whether complicated, unspecified whether persistent (HHS-HCC)    Hypertension    Thyroid disease     There is no problem list on file for this patient.   Past Surgical History:  Procedure Laterality Date   fibroids       Allergies  Allergen Reactions   Percocet [Oxycodone-Acetaminophen] Unknown    Current Outpatient Medications on File Prior to Visit  Medication Sig Dispense Refill   albuterol MDI, PROVENTIL, VENTOLIN, PROAIR, HFA 90 mcg/actuation inhaler Inhale into the lungs     amLODIPine-olmesartan  (AZOR) 10-40 mg tablet Take 1 tablet by mouth once daily     polyethylene glycol (MIRALAX) packet Take 17 g by mouth 2 (two) times daily     No current facility-administered medications on file prior to visit.    Family History  Problem Relation Age of Onset   High blood pressure (Hypertension) Mother    High blood pressure (Hypertension) Father      Social History   Tobacco Use  Smoking Status Former   Types: Cigarettes  Smokeless Tobacco Not on file     Social History   Socioeconomic History   Marital status: Single  Tobacco Use   Smoking status: Former    Types: Cigarettes  Substance and Sexual Activity   Alcohol use: Not Currently   Drug use: Not Currently   Social Drivers of Health   Food Insecurity: No Food Insecurity (12/30/2022)   Received from [facility]   Hunger Vital Sign    Worried About Running Out of Food in the Last Year: Never true    Ran Out of Food in the Last Year: Never true  Transportation Needs: No Transportation Needs (12/30/2022)   Received from [facility]    Lack of Transportation (Medical): No    Lack of Transportation (Non-Medical): No    Objective:    Vitals:   05/21/23 0942  BP: 124/82  Pulse: 105  Weight: 57.9 kg (127 lb 9.6 oz)  Height: 162.6 cm (5\' 4" )     Exam Gen: NAD CV: RRR Lungs:  CTA Abd: soft, non-distended    Labs, Imaging and Diagnostic Testing: CT reviewed.  Patient appears to have an apple core lesion in the area of the splenic flexure. CT chest is completed but not read yet. Assessment and Plan:  Diagnoses and all orders for this visit:  Malignant neoplasm of transverse colon (CMS/HHS-HCC)  52 year old female with newly diagnosed partially obstructing splenic flexure colon cancer.  I have recommended a partial colectomy.  We have discussed the importance of continuing her daily MiraLAX to avoid an obstruction prior to surgery.  The surgery and anatomy were described to the  patient as well as the risks of surgery and the possible complications.  These include: Bleeding, deep abdominal infections and possible wound complications such as hernia and infection, damage to adjacent structures, leak of surgical connections, which can lead to other surgeries and possibly an ostomy, possible need for other procedures, such as abscess drains in radiology, possible prolonged hospital stay, possible diarrhea from removal of part of the colon, possible constipation from narcotics, possible bowel, bladder or sexual dysfunction if having rectal surgery, prolonged fatigue/weakness or appetite loss, possible early recurrence of of disease, possible complications of their medical problems such as heart disease or arrhythmias or lung problems, death (less than 1%). I believe the patient understands and wishes to proceed with the surgery.    Vanita Panda, MD Colon and Rectal Surgery [facility]

## 2023-05-26 ENCOUNTER — Ambulatory Visit (HOSPITAL_COMMUNITY): Payer: Commercial Managed Care - PPO

## 2023-06-02 ENCOUNTER — Other Ambulatory Visit: Payer: Self-pay | Admitting: Primary Care

## 2023-06-02 ENCOUNTER — Ambulatory Visit (HOSPITAL_COMMUNITY)
Admission: RE | Admit: 2023-06-02 | Discharge: 2023-06-02 | Disposition: A | Discharge status: Home / Self Care | Payer: Commercial Managed Care - PPO | Source: Ambulatory Visit | Attending: Gastroenterology | Admitting: Gastroenterology

## 2023-06-02 DIAGNOSIS — N611 Abscess of the breast and nipple: Secondary | ICD-10-CM

## 2023-06-02 DIAGNOSIS — K769 Liver disease, unspecified: Secondary | ICD-10-CM | POA: Diagnosis present

## 2023-06-02 MED ORDER — GADOBUTROL 1 MMOL/ML IV SOLN
6.0000 mL | Freq: Once | INTRAVENOUS | Status: AC | PRN
Start: 2023-06-02 — End: 2023-06-02
  Administered 2023-06-02: 6 mL via INTRAVENOUS

## 2023-06-03 ENCOUNTER — Telehealth: Payer: Self-pay

## 2023-06-03 NOTE — Telephone Encounter (Signed)
Called patient there was no answer and I was not able to leave a VM.

## 2023-06-04 ENCOUNTER — Ambulatory Visit
Admission: RE | Admit: 2023-06-04 | Discharge: 2023-06-04 | Disposition: A | Discharge status: Home / Self Care | Payer: Commercial Managed Care - PPO | Source: Ambulatory Visit | Attending: Primary Care | Admitting: Primary Care

## 2023-06-04 DIAGNOSIS — Z1231 Encounter for screening mammogram for malignant neoplasm of breast: Secondary | ICD-10-CM

## 2023-06-04 NOTE — Telephone Encounter (Signed)
Returned patient's call and spoke to her regarding FMLA paper work and doctors note. Patient stated that she was okay with receiving a doctors on mychart.

## 2023-06-04 NOTE — Telephone Encounter (Signed)
Patient returned called requested a call back to discuss. Please advise.

## 2023-06-04 NOTE — Telephone Encounter (Signed)
Patient called asking that return to work not be revived with no restrictions and patient wanted to be able to wear Crocs. Note was revived.

## 2023-06-04 NOTE — Telephone Encounter (Signed)
Left Vm for patient letting her know she could have note to return to work and to call back to discuss FMLA paper work.

## 2023-06-04 NOTE — Telephone Encounter (Signed)
Patient called, wanting to speak to [facility] again.

## 2023-06-19 ENCOUNTER — Encounter (HOSPITAL_COMMUNITY): Payer: Self-pay

## 2023-06-19 NOTE — Patient Instructions (Addendum)
SURGICAL WAITING ROOM VISITATION Patients having surgery or a procedure may have no more than 2 support people in the waiting area - these visitors may rotate.    Children under the age of 3 must have an adult with them who is not the patient.  If the patient needs to stay at the hospital during part of their recovery, the visitor guidelines for inpatient rooms apply. Pre-op nurse will coordinate an appropriate time for 1 support person to accompany patient in pre-op.  This support person may not rotate.    Please refer to the [facility] website for the visitor guidelines for Inpatients (after your surgery is over and you are in a regular room).       Your procedure is scheduled on: 06-26-23   Report to [facility] Main Entrance    Report to admitting at 8:15 AM   Call this number if you have problems the morning of surgery [phone]   Follow a clear liquid diet the day before surgery    After Midnight you may have the following liquids until 7:30 AM DAY OF SURGERY  Water Non-Citrus Juices (without pulp, NO RED-Apple, White grape, White cranberry) Black Coffee (NO MILK/CREAM OR CREAMERS, sugar ok)  Clear Tea (NO MILK/CREAM OR CREAMERS, sugar ok) regular and decaf                             Plain Jell-O (NO RED)                                           Fruit ices (not with fruit pulp, NO RED)                                     Popsicles (NO RED)                                                               Sports drinks like Gatorade (NO RED)  Drink 2 Pre-surgery Ensure the evening before surgery                    The day of surgery:  Drink ONE (1) Pre-Surgery Clear Ensure by 7:30 AM the morning of surgery. Drink in one sitting. Do not sip.  This drink was given to you during your hospital  pre-op appointment visit. Nothing else to drink after completing the Pre-Surgery Clear Ensure          If you have questions, please contact your surgeon's  office.   FOLLOW BOWEL PREP AND ANY ADDITIONAL PRE OP INSTRUCTIONS YOU RECEIVED FROM YOUR SURGEON'S OFFICE!!!    -Clear liquids day of prep to prevent dehydration  - Bisacodyl (Dulcolax) 20 mg - Give with water the day prior to surgery.   - Miralax 255g - Mix with 64 oz Gatorade/Powerade.  Drink gradually over the next few hours (8 oz glass every 15-30 minutes) until gone the day prior to surgery.   -Metronidazole (Flagyl) 1000 mg - At 2 pm, 3 pm and 10 pm after Miralax  bowel prep the day prior to surgery.       -Neomycin 1000 mg - At 2 pm, 3 pm and 10 pm after Miralax  bowel prep the day prior to surgery.   Oral Hygiene is also important to reduce your risk of infection.                                    Remember - BRUSH YOUR TEETH THE MORNING OF SURGERY WITH YOUR REGULAR TOOTHPASTE   Do NOT smoke after Midnight   Take these medicines the morning of surgery with A SIP OF WATER:   Amlodipine  Albuterol inhaler  Nasacort nasal spray  Stop all vitamins and herbal supplements 7 days before surgery                              You may not have any metal on your body including hair pins, jewelry, and body piercing             Do not wear make-up, lotions, powders, perfumes or deodorant  Do not wear nail polish including gel and S&S, artificial/acrylic nails, or any other type of covering on natural nails including finger and toenails. If you have artificial nails, gel coating, etc. that needs to be removed by a nail salon please have this removed prior to surgery or surgery may need to be canceled/ delayed if the surgeon/ anesthesia feels like they are unable to be safely monitored.   Do not shave  48 hours prior to surgery.    Do not bring valuables to the hospital. [facility] IS NOT RESPONSIBLE   FOR VALUABLES.   Contacts, dentures or bridgework may not be worn into surgery.   Bring small overnight bag day of surgery.   DO NOT BRING YOUR HOME MEDICATIONS TO THE  HOSPITAL. PHARMACY WILL DISPENSE MEDICATIONS LISTED ON YOUR MEDICATION LIST TO YOU DURING YOUR ADMISSION IN THE HOSPITAL!              Please read over the following fact sheets you were given: IF YOU HAVE QUESTIONS ABOUT YOUR PRE-OP INSTRUCTIONS PLEASE CALL [phone] [facility]  If you received a COVID test during your pre-op visit  it is requested that you wear a mask when out in public, stay away from anyone that may not be feeling well and notify your surgeon if you develop symptoms. If you test positive for Covid or have been in contact with anyone that has tested positive in the last 10 days please notify you surgeon.  [facility] - Preparing for Surgery Before surgery, you can play an important role.  Because skin is not sterile, your skin needs to be as free of germs as possible.  You can reduce the number of germs on your skin by washing with CHG (chlorahexidine gluconate) soap before surgery.  CHG is an antiseptic cleaner which kills germs and bonds with the skin to continue killing germs even after washing. Please DO NOT use if you have an allergy to CHG or antibacterial soaps.  If your skin becomes reddened/irritated stop using the CHG and inform your nurse when you arrive at Short Stay. Do not shave (including legs and underarms) for at least 48 hours prior to the first CHG shower.  You may shave your face/neck.  Please follow these instructions carefully:  1.  Shower with CHG  Soap the night before surgery and the  morning of surgery.  2.  If you choose to wash your hair, wash your hair first as usual with your normal  shampoo.  3.  After you shampoo, rinse your hair and body thoroughly to remove the shampoo.                             4.  Use CHG as you would any other liquid soap.  You can apply chg directly to the skin and wash.  Gently with a scrungie or clean washcloth.  5.  Apply the CHG Soap to your body ONLY FROM THE NECK DOWN.   Do   not use on face/ open                            Wound or open sores. Avoid contact with eyes, ears mouth and   genitals (private parts).                       Wash face,  Genitals (private parts) with your normal soap.             6.  Wash thoroughly, paying special attention to the area where your    surgery  will be performed.  7.  Thoroughly rinse your body with warm water from the neck down.  8.  DO NOT shower/wash with your normal soap after using and rinsing off the CHG Soap.                9.  Pat yourself dry with a clean towel.            10.  Wear clean pajamas.            11.  Place clean sheets on your bed the night of your first shower and do not  sleep with pets. Day of Surgery : Do not apply any lotions/deodorants the morning of surgery.  Please wear clean clothes to the hospital/surgery center.  FAILURE TO FOLLOW THESE INSTRUCTIONS MAY RESULT IN THE CANCELLATION OF YOUR SURGERY  PATIENT SIGNATURE_________________________________  NURSE SIGNATURE__________________________________  ________________________________________________________________________      Carol Mack  An incentive spirometer is a tool that can help keep your lungs clear and active. This tool measures how well you are filling your lungs with each breath. Taking long deep breaths may help reverse or decrease the chance of developing breathing (pulmonary) problems (especially infection) following: A long period of time when you are unable to move or be active. BEFORE THE PROCEDURE  If the spirometer includes an indicator to show your best effort, your nurse or respiratory therapist will set it to a desired goal. If possible, sit up straight or lean slightly forward. Try not to slouch. Hold the incentive spirometer in an upright position. INSTRUCTIONS FOR USE  Sit on the edge of your bed if possible, or sit up as far as you can in bed or on a chair. Hold the incentive spirometer in an upright position. Breathe out normally. Place the  mouthpiece in your mouth and seal your lips tightly around it. Breathe in slowly and as deeply as possible, raising the piston or the ball toward the top of the column. Hold your breath for 3-5 seconds or for as long as possible. Allow the piston or ball to fall to the bottom of the column.  Remove the mouthpiece from your mouth and breathe out normally. Rest for a few seconds and repeat Steps 1 through 7 at least 10 times every 1-2 hours when you are awake. Take your time and take a few normal breaths between deep breaths. The spirometer may include an indicator to show your best effort. Use the indicator as a goal to work toward during each repetition. After each set of 10 deep breaths, practice coughing to be sure your lungs are clear. If you have an incision (the cut made at the time of surgery), support your incision when coughing by placing a pillow or rolled up towels firmly against it. Once you are able to get out of bed, walk around indoors and cough well. You may stop using the incentive spirometer when instructed by your caregiver.  RISKS AND COMPLICATIONS Take your time so you do not get dizzy or light-headed. If you are in pain, you may need to take or ask for pain medication before doing incentive spirometry. It is harder to take a deep breath if you are having pain. AFTER USE Rest and breathe slowly and easily. It can be helpful to keep track of a log of your progress. Your caregiver can provide you with a simple table to help with this. If you are using the spirometer at home, follow these instructions: SEEK MEDICAL CARE IF:  You are having difficultly using the spirometer. You have trouble using the spirometer as often as instructed. Your pain medication is not giving enough relief while using the spirometer. You develop fever of 100.5 F (38.1 C) or higher. SEEK IMMEDIATE MEDICAL CARE IF:  You cough up bloody sputum that had not been present before. You develop fever of 102 F  (38.9 C) or greater. You develop worsening pain at or near the incision site. MAKE SURE YOU:  Understand these instructions. Will watch your condition. Will get help right away if you are not doing well or get worse. Document Released: 11/11/2006 Document Revised: 09/23/2011 Document Reviewed: 01/12/2007 [employer] Patient Information 2014 [employer], [place].   ________________________________________________________________________  WHAT IS A BLOOD TRANSFUSION? Blood Transfusion Information  A transfusion is the replacement of blood or some of its parts. Blood is made up of multiple cells which provide different functions. Red blood cells carry oxygen and are used for blood loss replacement. White blood cells fight against infection. Platelets control bleeding. Plasma helps clot blood. Other blood products are available for specialized needs, such as hemophilia or other clotting disorders. BEFORE THE TRANSFUSION  Who gives blood for transfusions?  Healthy volunteers who are fully evaluated to make sure their blood is safe. This is blood bank blood. Transfusion therapy is the safest it has ever been in the practice of medicine. Before blood is taken from a donor, a complete history is taken to make sure that person has no history of diseases nor engages in risky social behavior (examples are intravenous drug use or sexual activity with multiple partners). The donor's travel history is screened to minimize risk of transmitting infections, such as malaria. The donated blood is tested for signs of infectious diseases, such as HIV and hepatitis. The blood is then tested to be sure it is compatible with you in order to minimize the chance of a transfusion reaction. If you or a relative donates blood, this is often done in anticipation of surgery and is not appropriate for emergency situations. It takes many days to process the donated blood. RISKS AND COMPLICATIONS Although transfusion therapy  is  very safe and saves many lives, the main dangers of transfusion include:  Getting an infectious disease. Developing a transfusion reaction. This is an allergic reaction to something in the blood you were given. Every precaution is taken to prevent this. The decision to have a blood transfusion has been considered carefully by your caregiver before blood is given. Blood is not given unless the benefits outweigh the risks. AFTER THE TRANSFUSION Right after receiving a blood transfusion, you will usually feel much better and more energetic. This is especially true if your red blood cells have gotten low (anemic). The transfusion raises the level of the red blood cells which carry oxygen, and this usually causes an energy increase. The nurse administering the transfusion will monitor you carefully for complications. HOME CARE INSTRUCTIONS  No special instructions are needed after a transfusion. You may find your energy is better. Speak with your caregiver about any limitations on activity for underlying diseases you may have. SEEK MEDICAL CARE IF:  Your condition is not improving after your transfusion. You develop redness or irritation at the intravenous (IV) site. SEEK IMMEDIATE MEDICAL CARE IF:  Any of the following symptoms occur over the next 12 hours: Shaking chills. You have a temperature by mouth above 102 F (38.9 C), not controlled by medicine. Chest, back, or muscle pain. People around you feel you are not acting correctly or are confused. Shortness of breath or difficulty breathing. Dizziness and fainting. You get a rash or develop hives. You have a decrease in urine output. Your urine turns a dark color or changes to pink, red, or brown. Any of the following symptoms occur over the next 10 days: You have a temperature by mouth above 102 F (38.9 C), not controlled by medicine. Shortness of breath. Weakness after normal activity. The white part of the eye turns yellow  (jaundice). You have a decrease in the amount of urine or are urinating less often. Your urine turns a dark color or changes to pink, red, or brown. Document Released: 06/28/2000 Document Revised: 09/23/2011 Document Reviewed: 02/15/2008 [facility] Patient Information 2014 [place], [place].  _______________________________________________________________________

## 2023-06-19 NOTE — Progress Notes (Addendum)
COVID Vaccine Completed:  Yes  Date of COVID positive in last 90 days:  No  PCP - Gwinda Passe, NP Cardiologist - N/A  Chest x-ray - CT chest 05-14-23 Epic EKG - 03-31-23 Epic Stress Test - N/A ECHO - N/A Cardiac Cath - N/A Pacemaker/ICD device last checked: Spinal Cord Stimulator:N/A  Bowel Prep -  Yes, patient states that she has prep and instructions  Sleep Study - N/A CPAP -   Fasting Blood Sugar - N/A Checks Blood Sugar _____ times a day  Last dose of GLP1 agonist-  N/A GLP1 instructions:  Hold 7 days before surgery    Last dose of SGLT-2 inhibitors-  N/A SGLT-2 instructions:  Hold 3 days before surgery    Blood Thinner Instructions N/A Aspirin Instructions: Last Dose:  Activity level:  Can go up a flight of stairs and perform activities of daily living without stopping and without symptoms of chest pain or shortness of breath.  Anesthesia review: Insterstitial lung disease.    Enlarged pulmonic trunk indicative of pulmonary HTN and emphysema seen on CT chest   BP elevated at PAT, patient has not taken her BP meds in a few days.  She was advised to take BP meds regularly until surgery.  She denied any headaches, chest pain or SOB.  Patient denies shortness of breath, fever, cough and chest pain at PAT appointment  Patient verbalized understanding of instructions that were given to them at the PAT appointment. Patient was also instructed that they will need to review over the PAT instructions again at home before surgery.

## 2023-06-20 ENCOUNTER — Encounter (HOSPITAL_COMMUNITY)
Admission: RE | Admit: 2023-06-20 | Discharge: 2023-06-20 | Disposition: A | Discharge status: Home / Self Care | Payer: Commercial Managed Care - PPO | Source: Ambulatory Visit | Attending: General Surgery

## 2023-06-20 ENCOUNTER — Encounter (HOSPITAL_COMMUNITY): Payer: Self-pay

## 2023-06-20 ENCOUNTER — Other Ambulatory Visit: Payer: Self-pay

## 2023-06-20 VITALS — BP 150/103 | HR 87 | Temp 98.8°F | Resp 20 | Ht 64.0 in | Wt 123.2 lb

## 2023-06-20 DIAGNOSIS — Z01818 Encounter for other preprocedural examination: Secondary | ICD-10-CM

## 2023-06-20 DIAGNOSIS — Z01812 Encounter for preprocedural laboratory examination: Secondary | ICD-10-CM | POA: Diagnosis present

## 2023-06-20 DIAGNOSIS — I10 Essential (primary) hypertension: Secondary | ICD-10-CM | POA: Insufficient documentation

## 2023-06-20 HISTORY — DX: Interstitial pulmonary disease, unspecified: J84.9

## 2023-06-20 HISTORY — DX: Anemia, unspecified: D64.9

## 2023-06-20 LAB — CBC
HCT: 33.2 % — ABNORMAL LOW (ref 36.0–46.0)
Hemoglobin: 10.8 g/dL — ABNORMAL LOW (ref 12.0–15.0)
MCH: 21.2 pg — ABNORMAL LOW (ref 26.0–34.0)
MCHC: 32.5 g/dL (ref 30.0–36.0)
MCV: 65.1 fL — ABNORMAL LOW (ref 80.0–100.0)
Platelets: 482 10*3/uL — ABNORMAL HIGH (ref 150–400)
RBC: 5.1 MIL/uL (ref 3.87–5.11)
RDW: 20.1 % — ABNORMAL HIGH (ref 11.5–15.5)
WBC: 8.6 10*3/uL (ref 4.0–10.5)
nRBC: 0 % (ref 0.0–0.2)

## 2023-06-20 LAB — BASIC METABOLIC PANEL
Anion gap: 7 (ref 5–15)
BUN: 9 mg/dL (ref 6–20)
CO2: 25 mmol/L (ref 22–32)
Calcium: 8.7 mg/dL — ABNORMAL LOW (ref 8.9–10.3)
Chloride: 105 mmol/L (ref 98–111)
Creatinine, Ser: 0.59 mg/dL (ref 0.44–1.00)
GFR, Estimated: >60 mL/min (ref >60)
Glucose, Bld: 101 mg/dL — ABNORMAL HIGH (ref 70–99)
Potassium: 4.3 mmol/L (ref 3.5–5.1)
Sodium: 137 mmol/L (ref 135–145)

## 2023-06-22 ENCOUNTER — Emergency Department (HOSPITAL_COMMUNITY)
Admission: EM | Admit: 2023-06-22 | Discharge: 2023-06-22 | Disposition: A | Discharge status: Home / Self Care | Payer: Commercial Managed Care - PPO | Attending: Emergency Medicine | Admitting: Emergency Medicine

## 2023-06-22 ENCOUNTER — Encounter (HOSPITAL_COMMUNITY): Payer: Self-pay | Admitting: Emergency Medicine

## 2023-06-22 ENCOUNTER — Other Ambulatory Visit: Payer: Self-pay

## 2023-06-22 DIAGNOSIS — R1110 Vomiting, unspecified: Secondary | ICD-10-CM | POA: Insufficient documentation

## 2023-06-22 DIAGNOSIS — R1030 Lower abdominal pain, unspecified: Secondary | ICD-10-CM | POA: Diagnosis present

## 2023-06-22 DIAGNOSIS — J45909 Unspecified asthma, uncomplicated: Secondary | ICD-10-CM | POA: Diagnosis not present

## 2023-06-22 DIAGNOSIS — I10 Essential (primary) hypertension: Secondary | ICD-10-CM | POA: Diagnosis not present

## 2023-06-22 DIAGNOSIS — Z79899 Other long term (current) drug therapy: Secondary | ICD-10-CM | POA: Insufficient documentation

## 2023-06-22 LAB — URINALYSIS, ROUTINE W REFLEX MICROSCOPIC
Bilirubin Urine: NEGATIVE
Glucose, UA: NEGATIVE mg/dL
Hgb urine dipstick: NEGATIVE
Ketones, ur: NEGATIVE mg/dL
Leukocytes,Ua: NEGATIVE
Nitrite: NEGATIVE
Protein, ur: NEGATIVE mg/dL
Specific Gravity, Urine: 1.019 (ref 1.005–1.030)
pH: 5 (ref 5.0–8.0)

## 2023-06-22 LAB — CBC WITH DIFFERENTIAL/PLATELET
Abs Immature Granulocytes: 0.01 10*3/uL (ref 0.00–0.07)
Basophils Absolute: 0 10*3/uL (ref 0.0–0.1)
Basophils Relative: 0 %
Eosinophils Absolute: 0.2 10*3/uL (ref 0.0–0.5)
Eosinophils Relative: 2 %
HCT: 31.5 % — ABNORMAL LOW (ref 36.0–46.0)
Hemoglobin: 10.4 g/dL — ABNORMAL LOW (ref 12.0–15.0)
Immature Granulocytes: 0 %
Lymphocytes Relative: 24 %
Lymphs Abs: 2.3 10*3/uL (ref 0.7–4.0)
MCH: 21.2 pg — ABNORMAL LOW (ref 26.0–34.0)
MCHC: 33 g/dL (ref 30.0–36.0)
MCV: 64.2 fL — ABNORMAL LOW (ref 80.0–100.0)
Monocytes Absolute: 1.1 10*3/uL — ABNORMAL HIGH (ref 0.1–1.0)
Monocytes Relative: 11 %
Neutro Abs: 5.9 10*3/uL (ref 1.7–7.7)
Neutrophils Relative %: 63 %
Platelets: 463 10*3/uL — ABNORMAL HIGH (ref 150–400)
RBC: 4.91 MIL/uL (ref 3.87–5.11)
RDW: 19.6 % — ABNORMAL HIGH (ref 11.5–15.5)
WBC: 9.6 10*3/uL (ref 4.0–10.5)
nRBC: 0 % (ref 0.0–0.2)

## 2023-06-22 LAB — COMPREHENSIVE METABOLIC PANEL
ALT: 10 U/L (ref 0–44)
AST: 13 U/L — ABNORMAL LOW (ref 15–41)
Albumin: 2.9 g/dL — ABNORMAL LOW (ref 3.5–5.0)
Alkaline Phosphatase: 58 U/L (ref 38–126)
Anion gap: 9 (ref 5–15)
BUN: 10 mg/dL (ref 6–20)
CO2: 25 mmol/L (ref 22–32)
Calcium: 8.7 mg/dL — ABNORMAL LOW (ref 8.9–10.3)
Chloride: 103 mmol/L (ref 98–111)
Creatinine, Ser: 0.62 mg/dL (ref 0.44–1.00)
GFR, Estimated: >60 mL/min (ref >60)
Glucose, Bld: 105 mg/dL — ABNORMAL HIGH (ref 70–99)
Potassium: 4.1 mmol/L (ref 3.5–5.1)
Sodium: 137 mmol/L (ref 135–145)
Total Bilirubin: 0.3 mg/dL (ref <1.2)
Total Protein: 6.7 g/dL (ref 6.5–8.1)

## 2023-06-22 LAB — HCG, SERUM, QUALITATIVE: Preg, Serum: NEGATIVE

## 2023-06-22 LAB — LIPASE, BLOOD: Lipase: 22 U/L (ref 11–51)

## 2023-06-22 MED ORDER — HYDROMORPHONE HCL 1 MG/ML IJ SOLN
1.0000 mg | Freq: Once | INTRAMUSCULAR | Status: AC
  Administered 2023-06-22: 1 mg via INTRAVENOUS
  Filled 2023-06-22: qty 1

## 2023-06-22 MED ORDER — ONDANSETRON HCL 4 MG/2ML IJ SOLN
4.0000 mg | Freq: Once | INTRAMUSCULAR | Status: AC
  Administered 2023-06-22: 4 mg via INTRAVENOUS
  Filled 2023-06-22: qty 2

## 2023-06-22 MED ORDER — SODIUM CHLORIDE 0.9 % IV BOLUS
1000.0000 mL | Freq: Once | INTRAVENOUS | Status: AC
  Administered 2023-06-22: 1000 mL via INTRAVENOUS

## 2023-06-22 NOTE — ED Triage Notes (Signed)
PT BIB EMS for lower abdominal pain that she rates a 10/10, 3 episodes of emesis. Recently dx with colon cancer.

## 2023-06-22 NOTE — ED Notes (Signed)
Pt tolerated ginger ale and some graham crackers.

## 2023-06-22 NOTE — ED Provider Notes (Signed)
[place] EMERGENCY DEPARTMENT AT [facility] Provider Note   CSN: [id number] Arrival date & time: 06/22/23  0043     History  Chief Complaint  Patient presents with   Abdominal Pain   Emesis    Carol Mack is a 52 y.o. female.  52 year old female with a history of asthma, hypertension, Chiari malformation, colonic mass presents to the emergency department for abdominal pain and vomiting.  Symptoms began fairly suddenly, just prior to arrival.  She reports the pain to be in her lower abdomen.  She had 3 episodes of vomiting, but states that her pain has improved and nausea has resolved.  States that she feels like her symptoms may be related to a urinary tract infection as she has had some mild dysuria, but denies frequency as well as urgency.  No associated fevers, melena, hematochezia.    Is presently scheduled for a partial colectomy on 06/26/2023.  The history is provided by the patient. No language interpreter was used.  Abdominal Pain Associated symptoms: vomiting   Emesis Associated symptoms: abdominal pain        Home Medications Prior to Admission medications   Medication Sig Start Date End Date Taking? Authorizing Provider  albuterol (VENTOLIN HFA) 108 (90 Base) MCG/ACT inhaler Inhale 1-2 puffs into the lungs every 6 (six) hours as needed for shortness of breath. 12/30/22  Yes Milas Hock, MD  amLODipine (NORVASC) 10 MG tablet Take 10 mg by mouth daily.   Yes [provider]  amLODipine-olmesartan (AZOR) 10-40 MG tablet Take 1 tablet by mouth daily. Patient not taking: Reported on 06/19/2023 04/14/23   [name], NP  polyethylene glycol (MIRALAX) 17 g packet Take 17 g by mouth 2 (two) times daily. Patient not taking: Reported on 06/22/2023 05/11/23   [name], MD  triamcinolone (NASACORT ALLERGY 24HR) 55 MCG/ACT AERO nasal inhaler Place 1 spray into the nose daily as needed (congestion).    [provider]      Allergies     Percocet [oxycodone-acetaminophen]    Review of Systems   Review of Systems  Gastrointestinal:  Positive for abdominal pain and vomiting.  Ten systems reviewed and are negative for acute change, except as noted in the HPI.    Physical Exam Updated Vital Signs BP (!) 148/97 (BP Location: Right Arm)   Pulse 74   Temp 98.7 F (37.1 C) (Oral)   Resp 16   LMP  (LMP Unknown)   SpO2 97%   Physical Exam Vitals and nursing note reviewed.  Constitutional:      General: She is not in acute distress.    Appearance: She is well-developed. She is not diaphoretic.     Comments: Nontoxic appearing and in NAD  HENT:     Head: Normocephalic and atraumatic.  Eyes:     General: No scleral icterus.    Extraocular Movements: EOM normal.     Conjunctiva/sclera: Conjunctivae normal.  Cardiovascular:     Rate and Rhythm: Normal rate and regular rhythm.     Pulses: Normal pulses.  Pulmonary:     Effort: Pulmonary effort is normal. No respiratory distress.     Comments: Respirations even and unlabored Abdominal:     Palpations: Abdomen is soft.     Tenderness: There is no abdominal tenderness. There is no guarding.     Comments: Soft, nontender, nondistended abdomen. No peritoneal signs.  Musculoskeletal:        General: Normal range of motion.  Cervical back: Normal range of motion.  Skin:    General: Skin is warm and dry.     Coloration: Skin is not pale.     Findings: No erythema or rash.  Neurological:     Mental Status: She is alert and oriented to person, place, and time.     Coordination: Coordination normal.  Psychiatric:        Mood and Affect: Mood is depressed.        Behavior: Behavior is withdrawn.     ED Results / Procedures / Treatments   Labs (all labs ordered are listed, but only abnormal results are displayed) Labs Reviewed  CBC WITH DIFFERENTIAL/PLATELET - Abnormal; Notable for the following components:      Result Value   Hemoglobin 10.4 (*)    HCT 31.5  (*)    MCV 64.2 (*)    MCH 21.2 (*)    RDW 19.6 (*)    Platelets 463 (*)    Monocytes Absolute 1.1 (*)    All other components within normal limits  COMPREHENSIVE METABOLIC PANEL - Abnormal; Notable for the following components:   Glucose, Bld 105 (*)    Calcium 8.7 (*)    Albumin 2.9 (*)    AST 13 (*)    All other components within normal limits  LIPASE, BLOOD  HCG, SERUM, QUALITATIVE  URINALYSIS, ROUTINE W REFLEX MICROSCOPIC    EKG None  Radiology No results found.  Procedures Procedures    Medications Ordered in ED Medications  sodium chloride 0.9 % bolus 1,000 mL (0 mLs Intravenous Stopped 06/22/23 0242)  ondansetron (ZOFRAN) injection 4 mg (4 mg Intravenous Given 06/22/23 0115)  HYDROmorphone (DILAUDID) injection 1 mg (1 mg Intravenous Given 06/22/23 0116)    ED Course/ Medical Decision Making/ A&P Clinical Course as of 06/22/23 0430  Sun Jun 22, 2023  0107 Patient declines abdominal imaging at this time. Would like to see if labs are c/w baseline before continuing with additional work up. [KH]  0137 Hemoglobin(!): 10.4 Likely related to recently diagnosed colon cancer. Stable compared to 2 days ago. [KH]  0234 UA negative. PO trial ordered. [KH]  0331 Tolerating PO fluids and crackers w/o issue. Repeat abdominal exam stable; nontender. [KH]    Clinical Course User Index [KH] Antony Madura, PA-C                                 Medical Decision Making Amount and/or Complexity of Data Reviewed Labs: ordered. Decision-making details documented in ED Course.  Risk Prescription drug management.   This patient presents to the ED for concern of abdominal pain w/nausea and vomiting, this involves an extensive number of treatment options, and is a complaint that carries with it a high risk of complications and morbidity.  The differential diagnosis includes PUD/gastritis vs pancreatitis vs viral illness vs biliary pathology vs pSBO/SBO   Co morbidities that  complicate the patient evaluation  Asthma HTN Colonic mass   Additional history obtained:  Additional history obtained from EMS personnel External records from outside source obtained and reviewed including CT from 05/11/23, negative for obstruction   Lab Tests:  I Ordered, and personally interpreted labs.  The pertinent results include:  Hgb 10.4. labs otherwise noncontributory, stable compared to baseline.   Cardiac Monitoring:  The patient was maintained on a cardiac monitor.  I personally viewed and interpreted the cardiac monitored which showed an underlying rhythm of:  NSR   Medicines ordered and prescription drug management:  I ordered medication including Dilaudid and Zofran for pain and nausea  Reevaluation of the patient after these medicines showed that the patient improved I have reviewed the patients home medicines and have made adjustments as needed   Test Considered:  Xray abdomen   Problem List / ED Course:  As above   Reevaluation:  After the interventions noted above, I reevaluated the patient and found that they have :improved   Social Determinants of Health:  Good social support   Dispostion:  After consideration of the diagnostic results and the patients response to treatment, I feel that the patent would benefit from outpatient follow-up with primary care doctor as well as specialist.  She reports having Zofran at home to use for nausea management as needed. Return precautions discussed and provided. Patient discharged in stable condition with no unaddressed concerns.          Final Clinical Impression(s) / ED Diagnoses Final diagnoses:  Lower abdominal pain    Rx / DC Orders ED Discharge Orders     None         Antony Madura, PA-C 06/22/23 0439    Nira Conn, MD 06/22/23 [phone]

## 2023-06-22 NOTE — Discharge Instructions (Signed)
Your in the emergency department was reassuring.  We recommend the use of Zofran for management of ongoing nausea.  Follow-up with your primary care doctor and specialists.  Return for new or concerning symptoms.

## 2023-06-25 NOTE — Anesthesia Preprocedure Evaluation (Signed)
Anesthesia Evaluation  Patient identified by MRN, date of birth, ID band Patient awake    Reviewed: Allergy & Precautions, NPO status , Patient's Chart, lab work & pertinent test results  Airway Mallampati: II  TM Distance: >3 FB Neck ROM: Full    Dental no notable dental hx. (+) Poor Dentition, Dental Advisory Given, Chipped, Missing,    Pulmonary asthma , Current Smoker and Patient abstained from smoking., former smoker   Pulmonary exam normal breath sounds clear to auscultation       Cardiovascular hypertension, Pt. on medications Normal cardiovascular exam Rhythm:Regular Rate:Normal     Neuro/Psych negative neurological ROS  negative psych ROS   GI/Hepatic negative GI ROS, Neg liver ROS,,,  Endo/Other   Hyperthyroidism   Renal/GU negative Renal ROS  negative genitourinary   Musculoskeletal negative musculoskeletal ROS (+)    Abdominal   Peds negative pediatric ROS (+)  Hematology  (+) Blood dyscrasia, anemia   Anesthesia Other Findings   Reproductive/Obstetrics negative OB ROS                             Anesthesia Physical Anesthesia Plan  ASA: 2  Anesthesia Plan: General   Post-op Pain Management: Minimal or no pain anticipated   Induction: Intravenous  PONV Risk Score and Plan: 3 and Ondansetron, Dexamethasone, Treatment may vary due to age or medical condition and Scopolamine patch - Pre-op  Airway Management Planned: Oral ETT  Additional Equipment: None  Intra-op Plan:   Post-operative Plan: Extubation in OR  Informed Consent: I have reviewed the patients History and Physical, chart, labs and discussed the procedure including the risks, benefits and alternatives for the proposed anesthesia with the patient or authorized representative who has indicated his/her understanding and acceptance.     Dental advisory given  Plan Discussed with: CRNA and  Anesthesiologist  Anesthesia Plan Comments: (  )        Anesthesia Quick Evaluation

## 2023-06-26 ENCOUNTER — Encounter (HOSPITAL_COMMUNITY): Payer: Self-pay | Admitting: General Surgery

## 2023-06-26 ENCOUNTER — Encounter (HOSPITAL_COMMUNITY)
Admission: RE | Disposition: A | Discharge status: Home / Self Care | Payer: Self-pay | Source: Ambulatory Visit | Attending: General Surgery

## 2023-06-26 ENCOUNTER — Other Ambulatory Visit: Payer: Self-pay

## 2023-06-26 ENCOUNTER — Inpatient Hospital Stay (HOSPITAL_COMMUNITY): Payer: Commercial Managed Care - PPO | Admitting: Anesthesiology

## 2023-06-26 ENCOUNTER — Inpatient Hospital Stay (HOSPITAL_COMMUNITY)
Admission: RE | Admit: 2023-06-26 | Discharge: 2023-07-02 | DRG: 329 | Disposition: A | Discharge status: Home / Self Care | Payer: Commercial Managed Care - PPO | Source: Ambulatory Visit | Attending: General Surgery | Admitting: General Surgery

## 2023-06-26 DIAGNOSIS — C772 Secondary and unspecified malignant neoplasm of intra-abdominal lymph nodes: Secondary | ICD-10-CM | POA: Diagnosis present

## 2023-06-26 DIAGNOSIS — J690 Pneumonitis due to inhalation of food and vomit: Secondary | ICD-10-CM | POA: Diagnosis not present

## 2023-06-26 DIAGNOSIS — Z87891 Personal history of nicotine dependence: Secondary | ICD-10-CM

## 2023-06-26 DIAGNOSIS — R0902 Hypoxemia: Secondary | ICD-10-CM | POA: Diagnosis not present

## 2023-06-26 DIAGNOSIS — I10 Essential (primary) hypertension: Secondary | ICD-10-CM | POA: Diagnosis present

## 2023-06-26 DIAGNOSIS — Z8249 Family history of ischemic heart disease and other diseases of the circulatory system: Secondary | ICD-10-CM | POA: Diagnosis not present

## 2023-06-26 DIAGNOSIS — Z885 Allergy status to narcotic agent status: Secondary | ICD-10-CM | POA: Diagnosis not present

## 2023-06-26 DIAGNOSIS — K567 Ileus, unspecified: Secondary | ICD-10-CM | POA: Diagnosis not present

## 2023-06-26 DIAGNOSIS — J45909 Unspecified asthma, uncomplicated: Secondary | ICD-10-CM | POA: Diagnosis present

## 2023-06-26 DIAGNOSIS — C189 Malignant neoplasm of colon, unspecified: Secondary | ICD-10-CM

## 2023-06-26 DIAGNOSIS — C188 Malignant neoplasm of overlapping sites of colon: Principal | ICD-10-CM | POA: Diagnosis present

## 2023-06-26 DIAGNOSIS — K635 Polyp of colon: Secondary | ICD-10-CM | POA: Diagnosis present

## 2023-06-26 LAB — TYPE AND SCREEN
ABO/RH(D): O POS
Antibody Screen: NEGATIVE

## 2023-06-26 SURGERY — COLECTOMY, PARTIAL, ROBOT-ASSISTED, LAPAROSCOPIC
Anesthesia: General

## 2023-06-26 MED ORDER — ACETAMINOPHEN 500 MG PO TABS
1000.0000 mg | ORAL_TABLET | Freq: Four times a day (QID) | ORAL | Status: DC
  Administered 2023-06-26 – 2023-07-01 (×15): 1000 mg via ORAL
  Filled 2023-06-26 (×20): qty 2

## 2023-06-26 MED ORDER — CHLORHEXIDINE GLUCONATE 0.12 % MT SOLN: 15.0000 mL | Freq: Once | OROMUCOSAL | Status: DC

## 2023-06-26 MED ORDER — 0.9 % SODIUM CHLORIDE (POUR BTL) OPTIME
TOPICAL | Status: DC | PRN
  Administered 2023-06-26: 1000 mL

## 2023-06-26 MED ORDER — LACTATED RINGERS IV SOLN: INTRAVENOUS | Status: DC

## 2023-06-26 MED ORDER — RINGERS IRRIGATION IR SOLN
Status: DC | PRN
  Administered 2023-06-26: 1000 mL

## 2023-06-26 MED ORDER — STERILE WATER FOR IRRIGATION IR SOLN
Status: DC | PRN
  Administered 2023-06-26: 500 mL

## 2023-06-26 MED ORDER — ONDANSETRON HCL 4 MG/2ML IJ SOLN
INTRAMUSCULAR | Status: AC
  Filled 2023-06-26: qty 8

## 2023-06-26 MED ORDER — BUPIVACAINE-EPINEPHRINE 0.25% -1:200000 IJ SOLN
INTRAMUSCULAR | Status: DC | PRN
  Administered 2023-06-26: 30 mL

## 2023-06-26 MED ORDER — ALBUTEROL SULFATE (2.5 MG/3ML) 0.083% IN NEBU: 3.0000 mL | INHALATION_SOLUTION | Freq: Four times a day (QID) | RESPIRATORY_TRACT | Status: DC | PRN

## 2023-06-26 MED ORDER — MIDAZOLAM HCL 5 MG/5ML IJ SOLN
INTRAMUSCULAR | Status: DC | PRN
  Administered 2023-06-26: 2 mg via INTRAVENOUS

## 2023-06-26 MED ORDER — ACETAMINOPHEN 160 MG/5ML PO SOLN: 325.0000 mg | ORAL | Status: DC | PRN

## 2023-06-26 MED ORDER — GABAPENTIN 100 MG PO CAPS
300.0000 mg | ORAL_CAPSULE | Freq: Two times a day (BID) | ORAL | Status: DC
  Administered 2023-06-26 – 2023-07-02 (×12): 300 mg via ORAL
  Filled 2023-06-26 (×12): qty 3

## 2023-06-26 MED ORDER — SODIUM CHLORIDE 0.9 % IV SOLN
2.0000 g | Freq: Two times a day (BID) | INTRAVENOUS | Status: AC
  Administered 2023-06-26: 2 g via INTRAVENOUS
  Filled 2023-06-26: qty 2

## 2023-06-26 MED ORDER — PROPOFOL 10 MG/ML IV BOLUS
INTRAVENOUS | Status: AC
  Filled 2023-06-26: qty 20

## 2023-06-26 MED ORDER — SUGAMMADEX SODIUM 200 MG/2ML IV SOLN
INTRAVENOUS | Status: DC | PRN
  Administered 2023-06-26: 200 mg via INTRAVENOUS

## 2023-06-26 MED ORDER — OXYCODONE HCL 5 MG/5ML PO SOLN: 5.0000 mg | Freq: Once | ORAL | Status: DC | PRN

## 2023-06-26 MED ORDER — ACETAMINOPHEN 325 MG PO TABS
325.0000 mg | ORAL_TABLET | ORAL | Status: DC | PRN
Start: 2023-06-26 — End: 2023-06-26

## 2023-06-26 MED ORDER — BUPIVACAINE LIPOSOME 1.3 % IJ SUSP
INTRAMUSCULAR | Status: AC
  Filled 2023-06-26: qty 20

## 2023-06-26 MED ORDER — DIPHENHYDRAMINE HCL 50 MG/ML IJ SOLN: 12.5000 mg | Freq: Four times a day (QID) | INTRAMUSCULAR | Status: DC | PRN

## 2023-06-26 MED ORDER — METOPROLOL TARTRATE 5 MG/5ML IV SOLN: 5.0000 mg | Freq: Four times a day (QID) | INTRAVENOUS | Status: DC | PRN

## 2023-06-26 MED ORDER — OXYCODONE HCL 5 MG PO TABS: 5.0000 mg | ORAL_TABLET | Freq: Once | ORAL | Status: DC | PRN

## 2023-06-26 MED ORDER — HEPARIN SODIUM (PORCINE) 5000 UNIT/ML IJ SOLN
5000.0000 [IU] | Freq: Once | INTRAMUSCULAR | Status: AC
  Administered 2023-06-26: 5000 [IU] via SUBCUTANEOUS
  Filled 2023-06-26: qty 1

## 2023-06-26 MED ORDER — BUPIVACAINE LIPOSOME 1.3 % IJ SUSP: 20.0000 mL | Freq: Once | INTRAMUSCULAR | Status: DC

## 2023-06-26 MED ORDER — ACETAMINOPHEN 500 MG PO TABS
1000.0000 mg | ORAL_TABLET | ORAL | Status: AC
  Administered 2023-06-26: 1000 mg via ORAL
  Filled 2023-06-26: qty 2

## 2023-06-26 MED ORDER — SACCHAROMYCES BOULARDII 250 MG PO CAPS
250.0000 mg | ORAL_CAPSULE | Freq: Two times a day (BID) | ORAL | Status: DC
  Administered 2023-06-26 – 2023-07-02 (×12): 250 mg via ORAL
  Filled 2023-06-26 (×12): qty 1

## 2023-06-26 MED ORDER — TRAMADOL HCL 50 MG PO TABS
50.0000 mg | ORAL_TABLET | Freq: Four times a day (QID) | ORAL | Status: DC | PRN
  Administered 2023-06-26: 50 mg via ORAL
  Administered 2023-06-27 – 2023-06-30 (×8): 100 mg via ORAL
  Administered 2023-06-30: 50 mg via ORAL
  Filled 2023-06-26 (×3): qty 2
  Filled 2023-06-26: qty 1
  Filled 2023-06-26 (×6): qty 2

## 2023-06-26 MED ORDER — MIDAZOLAM HCL 2 MG/2ML IJ SOLN
INTRAMUSCULAR | Status: AC
  Filled 2023-06-26: qty 2

## 2023-06-26 MED ORDER — FENTANYL CITRATE PF 50 MCG/ML IJ SOSY
PREFILLED_SYRINGE | INTRAMUSCULAR | Status: AC
  Administered 2023-06-26: 50 ug via INTRAVENOUS
  Filled 2023-06-26: qty 1

## 2023-06-26 MED ORDER — ENSURE SURGERY PO LIQD
237.0000 mL | Freq: Two times a day (BID) | ORAL | Status: DC
  Administered 2023-06-27 – 2023-07-02 (×9): 237 mL via ORAL

## 2023-06-26 MED ORDER — LIDOCAINE HCL (PF) 2 % IJ SOLN
INTRAMUSCULAR | Status: AC
  Filled 2023-06-26: qty 5

## 2023-06-26 MED ORDER — DEXAMETHASONE SODIUM PHOSPHATE 10 MG/ML IJ SOLN
INTRAMUSCULAR | Status: AC
  Filled 2023-06-26: qty 3

## 2023-06-26 MED ORDER — SPY AGENT GREEN - (INDOCYANINE FOR INJECTION)
INTRAMUSCULAR | Status: DC | PRN
  Administered 2023-06-26: 2.5 mL via INTRAVENOUS

## 2023-06-26 MED ORDER — ENSURE PRE-SURGERY PO LIQD: 592.0000 mL | Freq: Once | ORAL | Status: DC

## 2023-06-26 MED ORDER — FENTANYL CITRATE (PF) 100 MCG/2ML IJ SOLN
INTRAMUSCULAR | Status: AC
  Filled 2023-06-26: qty 2

## 2023-06-26 MED ORDER — ROCURONIUM BROMIDE 10 MG/ML (PF) SYRINGE
PREFILLED_SYRINGE | INTRAVENOUS | Status: AC
Start: 2023-06-26 — End: ?
  Filled 2023-06-26: qty 10

## 2023-06-26 MED ORDER — BUPIVACAINE LIPOSOME 1.3 % IJ SUSP
INTRAMUSCULAR | Status: DC | PRN
  Administered 2023-06-26: 20 mL

## 2023-06-26 MED ORDER — FENTANYL CITRATE PF 50 MCG/ML IJ SOSY
PREFILLED_SYRINGE | INTRAMUSCULAR | Status: AC
  Filled 2023-06-26: qty 2

## 2023-06-26 MED ORDER — FENTANYL CITRATE PF 50 MCG/ML IJ SOSY
25.0000 ug | PREFILLED_SYRINGE | INTRAMUSCULAR | Status: DC | PRN
  Administered 2023-06-26: 50 ug via INTRAVENOUS

## 2023-06-26 MED ORDER — ONDANSETRON HCL 4 MG/2ML IJ SOLN: 4.0000 mg | Freq: Once | INTRAMUSCULAR | Status: DC | PRN

## 2023-06-26 MED ORDER — ALVIMOPAN 12 MG PO CAPS
12.0000 mg | ORAL_CAPSULE | Freq: Two times a day (BID) | ORAL | Status: DC
  Administered 2023-06-27 – 2023-06-30 (×7): 12 mg via ORAL
  Filled 2023-06-26 (×7): qty 1

## 2023-06-26 MED ORDER — CARMEX CLASSIC LIP BALM EX OINT
TOPICAL_OINTMENT | CUTANEOUS | Status: DC | PRN
  Filled 2023-06-26: qty 10

## 2023-06-26 MED ORDER — DIPHENHYDRAMINE HCL 12.5 MG/5ML PO ELIX
12.5000 mg | ORAL_SOLUTION | Freq: Four times a day (QID) | ORAL | Status: DC | PRN
Start: 2023-06-26 — End: 2023-07-02

## 2023-06-26 MED ORDER — ENSURE PRE-SURGERY PO LIQD: 296.0000 mL | Freq: Once | ORAL | Status: DC

## 2023-06-26 MED ORDER — DEXAMETHASONE SODIUM PHOSPHATE 10 MG/ML IJ SOLN
INTRAMUSCULAR | Status: DC | PRN
  Administered 2023-06-26: 5 mg via INTRAVENOUS

## 2023-06-26 MED ORDER — LIDOCAINE HCL (CARDIAC) PF 100 MG/5ML IV SOSY
PREFILLED_SYRINGE | INTRAVENOUS | Status: DC | PRN
  Administered 2023-06-26: 100 mg via INTRAVENOUS

## 2023-06-26 MED ORDER — ROCURONIUM BROMIDE 100 MG/10ML IV SOLN
INTRAVENOUS | Status: DC | PRN
  Administered 2023-06-26: 100 mg via INTRAVENOUS

## 2023-06-26 MED ORDER — ONDANSETRON HCL 4 MG/2ML IJ SOLN
INTRAMUSCULAR | Status: DC | PRN
  Administered 2023-06-26: 4 mg via INTRAVENOUS

## 2023-06-26 MED ORDER — PHENYLEPHRINE HCL-NACL 20-0.9 MG/250ML-% IV SOLN
INTRAVENOUS | Status: DC | PRN
  Administered 2023-06-26: 20 ug/min via INTRAVENOUS

## 2023-06-26 MED ORDER — ENOXAPARIN SODIUM 40 MG/0.4ML IJ SOSY
40.0000 mg | PREFILLED_SYRINGE | INTRAMUSCULAR | Status: DC
  Administered 2023-06-27 – 2023-07-02 (×6): 40 mg via SUBCUTANEOUS
  Filled 2023-06-26 (×6): qty 0.4

## 2023-06-26 MED ORDER — ALVIMOPAN 12 MG PO CAPS
12.0000 mg | ORAL_CAPSULE | ORAL | Status: AC
  Administered 2023-06-26: 12 mg via ORAL
  Filled 2023-06-26: qty 1

## 2023-06-26 MED ORDER — KCL IN DEXTROSE-NACL 20-5-0.45 MEQ/L-%-% IV SOLN
INTRAVENOUS | Status: DC
  Filled 2023-06-26 (×2): qty 1000

## 2023-06-26 MED ORDER — PHENYLEPHRINE 80 MCG/ML (10ML) SYRINGE FOR IV PUSH (FOR BLOOD PRESSURE SUPPORT)
PREFILLED_SYRINGE | INTRAVENOUS | Status: AC
  Filled 2023-06-26: qty 10

## 2023-06-26 MED ORDER — PHENYLEPHRINE 80 MCG/ML (10ML) SYRINGE FOR IV PUSH (FOR BLOOD PRESSURE SUPPORT)
PREFILLED_SYRINGE | INTRAVENOUS | Status: DC | PRN
  Administered 2023-06-26: 120 ug via INTRAVENOUS
  Administered 2023-06-26: 160 ug via INTRAVENOUS

## 2023-06-26 MED ORDER — SIMETHICONE 80 MG PO CHEW: 40.0000 mg | CHEWABLE_TABLET | Freq: Four times a day (QID) | ORAL | Status: DC | PRN

## 2023-06-26 MED ORDER — ONDANSETRON HCL 4 MG PO TABS: 4.0000 mg | ORAL_TABLET | Freq: Four times a day (QID) | ORAL | Status: DC | PRN

## 2023-06-26 MED ORDER — SODIUM CHLORIDE 0.9 % IV SOLN
2.0000 g | INTRAVENOUS | Status: AC
  Administered 2023-06-26: 2 g via INTRAVENOUS
  Filled 2023-06-26: qty 2

## 2023-06-26 MED ORDER — ALUM & MAG HYDROXIDE-SIMETH 200-200-20 MG/5ML PO SUSP: 30.0000 mL | Freq: Four times a day (QID) | ORAL | Status: DC | PRN

## 2023-06-26 MED ORDER — MEPERIDINE HCL 50 MG/ML IJ SOLN: 6.2500 mg | INTRAMUSCULAR | Status: DC | PRN

## 2023-06-26 MED ORDER — LIDOCAINE HCL (PF) 2 % IJ SOLN
INTRAMUSCULAR | Status: AC
  Filled 2023-06-26: qty 10

## 2023-06-26 MED ORDER — GABAPENTIN 300 MG PO CAPS
300.0000 mg | ORAL_CAPSULE | ORAL | Status: AC
  Administered 2023-06-26: 300 mg via ORAL
  Filled 2023-06-26: qty 1

## 2023-06-26 MED ORDER — METHYLENE BLUE (ANTIDOTE) 1 % IV SOLN
INTRAVENOUS | Status: AC
  Filled 2023-06-26: qty 10

## 2023-06-26 MED ORDER — AMLODIPINE BESYLATE 10 MG PO TABS
10.0000 mg | ORAL_TABLET | Freq: Every day | ORAL | Status: DC
  Administered 2023-06-26 – 2023-07-02 (×7): 10 mg via ORAL
  Filled 2023-06-26 (×7): qty 1

## 2023-06-26 MED ORDER — ORAL CARE MOUTH RINSE: 15.0000 mL | Freq: Once | OROMUCOSAL | Status: DC

## 2023-06-26 MED ORDER — FENTANYL CITRATE (PF) 100 MCG/2ML IJ SOLN
INTRAMUSCULAR | Status: DC | PRN
  Administered 2023-06-26: 50 ug via INTRAVENOUS
  Administered 2023-06-26: 100 ug via INTRAVENOUS

## 2023-06-26 MED ORDER — ONDANSETRON HCL 4 MG/2ML IJ SOLN
4.0000 mg | Freq: Four times a day (QID) | INTRAMUSCULAR | Status: DC | PRN
  Administered 2023-06-28 – 2023-06-30 (×2): 4 mg via INTRAVENOUS
  Filled 2023-06-26 (×2): qty 2

## 2023-06-26 MED ORDER — PROPOFOL 10 MG/ML IV BOLUS
INTRAVENOUS | Status: DC | PRN
  Administered 2023-06-26: 150 mg via INTRAVENOUS

## 2023-06-26 MED ORDER — BUPIVACAINE-EPINEPHRINE 0.25% -1:200000 IJ SOLN
INTRAMUSCULAR | Status: AC
  Filled 2023-06-26: qty 1

## 2023-06-26 MED ORDER — HYDROMORPHONE HCL 1 MG/ML IJ SOLN
0.5000 mg | INTRAMUSCULAR | Status: DC | PRN
  Administered 2023-06-26 – 2023-06-30 (×7): 0.5 mg via INTRAVENOUS
  Filled 2023-06-26 (×7): qty 0.5

## 2023-06-26 SURGICAL SUPPLY — 80 items
BAG COUNTER SPONGE SURGICOUNT (BAG) ×1 IMPLANT
BLADE EXTENDED COATED 6.5IN (ELECTRODE) IMPLANT
CANNULA REDUCER 12-8 DVNC XI (CANNULA) IMPLANT
COVER SURGICAL LIGHT HANDLE (MISCELLANEOUS) ×2 IMPLANT
COVER TIP SHEARS 8 DVNC (MISCELLANEOUS) ×1 IMPLANT
DEFOGGER SCOPE WARMER CLEARIFY (MISCELLANEOUS) IMPLANT
DRAIN CHANNEL 19F RND (DRAIN) IMPLANT
DRAPE ARM DVNC X/XI (DISPOSABLE) ×4 IMPLANT
DRAPE COLUMN DVNC XI (DISPOSABLE) ×1 IMPLANT
DRAPE SURG IRRIG POUCH 19X23 (DRAPES) ×1 IMPLANT
DRIVER NDL LRG 8 DVNC XI (INSTRUMENTS) ×1 IMPLANT
DRIVER NDL MEGA 8 DVNC XI (INSTRUMENTS) IMPLANT
DRIVER NDLE LRG 8 DVNC XI (INSTRUMENTS) ×1
DRIVER NDLE MEGA DVNC XI (INSTRUMENTS) ×1
DRSG OPSITE POSTOP 4X10 (GAUZE/BANDAGES/DRESSINGS) IMPLANT
DRSG OPSITE POSTOP 4X6 (GAUZE/BANDAGES/DRESSINGS) IMPLANT
DRSG OPSITE POSTOP 4X8 (GAUZE/BANDAGES/DRESSINGS) IMPLANT
ELECT PENCIL ROCKER SW 15FT (MISCELLANEOUS) ×1 IMPLANT
ELECT REM PT RETURN 15FT ADLT (MISCELLANEOUS) ×1 IMPLANT
ENDOLOOP SUT PDS II 0 18 (SUTURE) IMPLANT
EVACUATOR SILICONE 100CC (DRAIN) IMPLANT
GLOVE BIO SURGEON STRL SZ 6.5 (GLOVE) ×3 IMPLANT
GLOVE INDICATOR 6.5 STRL GRN (GLOVE) ×3 IMPLANT
GOWN SRG XL LVL 4 BRTHBL STRL (GOWNS) ×1 IMPLANT
GOWN STRL REUS W/ TWL XL LVL3 (GOWN DISPOSABLE) ×3 IMPLANT
GRASPER SUT TROCAR 14GX15 (MISCELLANEOUS) IMPLANT
GRASPER TIP-UP FEN DVNC XI (INSTRUMENTS) ×1 IMPLANT
HOLDER FOLEY CATH W/STRAP (MISCELLANEOUS) ×1 IMPLANT
IRRIG SUCT STRYKERFLOW 2 WTIP (MISCELLANEOUS) ×1
IRRIGATION SUCT STRKRFLW 2 WTP (MISCELLANEOUS) ×1 IMPLANT
KIT PROCEDURE DVNC SI (MISCELLANEOUS) IMPLANT
KIT TURNOVER KIT A (KITS) IMPLANT
NDL INSUFFLATION 14GA 120MM (NEEDLE) ×1 IMPLANT
NEEDLE INSUFFLATION 14GA 120MM (NEEDLE) ×1
PACK CARDIOVASCULAR III (CUSTOM PROCEDURE TRAY) ×1 IMPLANT
PACK COLON (CUSTOM PROCEDURE TRAY) ×1 IMPLANT
PAD POSITIONING PINK XL (MISCELLANEOUS) ×1 IMPLANT
RELOAD STAPLE 60 2.5 WHT DVNC (STAPLE) IMPLANT
RELOAD STAPLE 60 3.5 BLU DVNC (STAPLE) IMPLANT
RELOAD STAPLE 60 4.3 GRN DVNC (STAPLE) IMPLANT
RETRACTOR WND ALEXIS 18 MED (MISCELLANEOUS) IMPLANT
RTRCTR WOUND ALEXIS 18CM MED (MISCELLANEOUS) ×1
SCISSORS LAP 5X35 DISP (ENDOMECHANICALS) IMPLANT
SCISSORS MNPLR CVD DVNC XI (INSTRUMENTS) ×1 IMPLANT
SEAL UNIV 5-12 XI (MISCELLANEOUS) ×3 IMPLANT
SEALER VESSEL EXT DVNC XI (MISCELLANEOUS) ×1 IMPLANT
SOL ELECTROSURG ANTI STICK (MISCELLANEOUS) ×1
SOLUTION ELECTROSURG ANTI STCK (MISCELLANEOUS) ×1 IMPLANT
SPIKE FLUID TRANSFER (MISCELLANEOUS) IMPLANT
STAPLER 60 SUREFORM DVNC (STAPLE) IMPLANT
STAPLER ECHELON POWER CIR 29 (STAPLE) IMPLANT
STAPLER ECHELON POWER CIR 31 (STAPLE) IMPLANT
STAPLER RELOAD 2.5X60 WHT DVNC (STAPLE) ×1
STAPLER RELOAD 3.5X60 BLU DVNC (STAPLE)
STAPLER RELOAD 4.3X60 GRN DVNC (STAPLE) ×3
STOPCOCK 4 WAY LG BORE MALE ST (IV SETS) ×2 IMPLANT
SUT ETHILON 2 0 PS N (SUTURE) IMPLANT
SUT NOVA NAB GS-21 1 T12 (SUTURE) ×2 IMPLANT
SUT PROLENE 2 0 KS (SUTURE) IMPLANT
SUT SILK 2 0 SH CR/8 (SUTURE) IMPLANT
SUT SILK 2-0 18XBRD TIE 12 (SUTURE) ×1 IMPLANT
SUT SILK 3 0 SH CR/8 (SUTURE) ×1 IMPLANT
SUT SILK 3-0 18XBRD TIE 12 (SUTURE) IMPLANT
SUT V-LOC BARB 180 2/0GR6 GS22 (SUTURE) ×3
SUT VIC AB 2-0 SH 18 (SUTURE) IMPLANT
SUT VIC AB 2-0 SH 27X BRD (SUTURE) IMPLANT
SUT VIC AB 3-0 SH 18 (SUTURE) IMPLANT
SUT VIC AB 4-0 PS2 27 (SUTURE) ×2 IMPLANT
SUT VICRYL 0 UR6 27IN ABS (SUTURE) ×1 IMPLANT
SUTURE V-LC BRB 180 2/0GR6GS22 (SUTURE) IMPLANT
SYR 20ML ECCENTRIC (SYRINGE) ×1 IMPLANT
SYS LAPSCP GELPORT 120MM (MISCELLANEOUS)
SYS WOUND ALEXIS 18CM MED (MISCELLANEOUS)
SYSTEM LAPSCP GELPORT 120MM (MISCELLANEOUS) IMPLANT
SYSTEM WOUND ALEXIS 18CM MED (MISCELLANEOUS) IMPLANT
TOWEL OR 17X26 10 PK STRL BLUE (TOWEL DISPOSABLE) IMPLANT
TRAY FOLEY MTR SLVR 16FR STAT (SET/KITS/TRAYS/PACK) ×1 IMPLANT
TROCAR ADV FIXATION 5X100MM (TROCAR) ×1 IMPLANT
TUBING CONNECTING 10 (TUBING) ×2 IMPLANT
TUBING INSUFFLATION 10FT LAP (TUBING) ×1 IMPLANT

## 2023-06-26 NOTE — Transfer of Care (Signed)
Immediate Anesthesia Transfer of Care Note  Patient: Carol Mack  Procedure(s) Performed: XI ROBOT ASSISTED LAPAROSCOPIC PARTIAL COLECTOMY  Patient Location: PACU  Anesthesia Type:General  Level of Consciousness: drowsy  Airway & Oxygen Therapy: Patient Spontanous Breathing and Patient connected to face mask oxygen  Post-op Assessment: Report given to RN and Post -op Vital signs reviewed and stable  Post vital signs: Reviewed and stable  Last Vitals:  Vitals Value Taken Time  BP 161/107 06/26/23 1430  Temp    Pulse 83 06/26/23 1431  Resp 30 06/26/23 1431  SpO2 100 % 06/26/23 1431  Vitals shown include unfiled device data.  Last Pain:  Vitals:   06/26/23 0816  TempSrc: Oral  PainSc:          Complications: No notable events documented.

## 2023-06-26 NOTE — Plan of Care (Signed)
  Problem: Education: Goal: Understanding of discharge needs will improve Outcome: Progressing Goal: Verbalization of understanding of the causes of altered bowel function will improve Outcome: Progressing   Problem: Activity: Goal: Ability to tolerate increased activity will improve Outcome: Progressing   Problem: Bowel/Gastric: Goal: Gastrointestinal status for postoperative course will improve Outcome: Progressing   Problem: Health Behavior/Discharge Planning: Goal: Identification of community resources to assist with postoperative recovery needs will improve Outcome: Progressing

## 2023-06-26 NOTE — Anesthesia Postprocedure Evaluation (Signed)
Anesthesia Post Note  Patient: Carol Mack  Procedure(s) Performed: XI ROBOT ASSISTED LAPAROSCOPIC PARTIAL COLECTOMY     Patient location during evaluation: PACU Anesthesia Type: General Level of consciousness: awake and alert Pain management: pain level controlled Vital Signs Assessment: post-procedure vital signs reviewed and stable Respiratory status: spontaneous breathing, nonlabored ventilation, respiratory function stable and patient connected to nasal cannula oxygen Cardiovascular status: blood pressure returned to baseline and stable Postop Assessment: no apparent nausea or vomiting Anesthetic complications: no   No notable events documented.  Last Vitals:  Vitals:   06/26/23 1427 06/26/23 1430  BP: (!) 171/108 (!) 161/107  Pulse: 86 84  Resp:  (!) 24  Temp:    SpO2:  100%    Last Pain:  Vitals:   06/26/23 0816  TempSrc: Oral  PainSc:                  [name]

## 2023-06-26 NOTE — H&P (Signed)
REFERRING PHYSICIAN:  Room, Emergency   PROVIDER:  Elenora Gamma, MD   MRN: [id number] DOB: [birth year]    Subjective    Chief Complaint: Colon Cancer       History of Present Illness: Carol Mack is a 52 y.o. female who is seen today as an office consultation at the request of the ER and Dr. Tomasa Rand for evaluation of Colon Cancer .  Patient presented to the ED with abdominal pain in September 2024.  Irregular wall thickening and luminal narrowing of the left transverse colon was identified.  No signs of metastatic disease were noted.  She underwent a colonoscopy on May 15, 2023.  The mass was found in the distal transverse colon.  This was biopsied and tattooed.  Mass was not traversed endoscopically.  Biopsy showed invasive moderately differentiated adenocarcinoma.  CEA level is 14.4.  She reports better bowel habits after the colonoscopy.  She is taking MiraLAX as needed.  Patient has no major medical history.  Surgical history significant for fibroid surgery.  Patient has approximately 20 pound weight loss in the last 6 months.     Review of Systems: A complete review of systems was obtained from the patient.  I have reviewed this information and discussed as appropriate with the patient.  See HPI as well for other ROS.     Medical History:     Past Medical History:  Diagnosis Date   Asthma, unspecified asthma severity, unspecified whether complicated, unspecified whether persistent (HHS-HCC)     Hypertension     Thyroid disease        There is no problem list on file for this patient.          Past Surgical History:  Procedure Laterality Date   fibroids              Allergies  Allergen Reactions   Percocet [Oxycodone-Acetaminophen] Unknown            Current Outpatient Medications on File Prior to Visit  Medication Sig Dispense Refill   albuterol MDI, PROVENTIL, VENTOLIN, PROAIR, HFA 90 mcg/actuation inhaler Inhale into the lungs        amLODIPine-olmesartan (AZOR) 10-40 mg tablet Take 1 tablet by mouth once daily       polyethylene glycol (MIRALAX) packet Take 17 g by mouth 2 (two) times daily        No current facility-administered medications on file prior to visit.           Family History  Problem Relation Age of Onset   High blood pressure (Hypertension) Mother     High blood pressure (Hypertension) Father        Social History        Tobacco Use  Smoking Status Former   Types: Cigarettes  Smokeless Tobacco Not on file      Social History         Socioeconomic History   Marital status: Single  Tobacco Use   Smoking status: Former      Types: Cigarettes  Substance and Sexual Activity   Alcohol use: Not Currently   Drug use: Not Currently    Social Drivers of Health        Food Insecurity: No Food Insecurity (12/30/2022)    Received from [facility]    Hunger Vital Sign     Worried About Running Out of Food in the Last Year: Never true     Ran Out of Food in the Last  Year: Never true  Transportation Needs: No Transportation Needs (12/30/2022)    Received from [facility]     Lack of Transportation (Medical): No     Lack of Transportation (Non-Medical): No      Objective:      Vitals:   06/26/23 0816  BP: (!) 139/100  Pulse: 92  Resp: 16  Temp: 98.7 F (37.1 C)  SpO2: 97%    Exam Gen: NAD CV: RRR Lungs: CTA Abd: soft, non-distended       Labs, Imaging and Diagnostic Testing: CT reviewed.  Patient appears to have an apple core lesion in the area of the splenic flexure. CT chest shows some cystic lung disease with some nodules that will need to be followed Assessment and Plan:  Diagnoses and all orders for this visit:   Malignant neoplasm of transverse colon (CMS/HHS-HCC)   52 year old female with newly diagnosed partially obstructing splenic flexure colon cancer.  I have recommended a partial colectomy.  We have discussed the importance of  continuing her daily MiraLAX to avoid an obstruction prior to surgery.   The surgery and anatomy were described to the patient as well as the risks of surgery and the possible complications.  These include: Bleeding, deep abdominal infections and possible wound complications such as hernia and infection, damage to adjacent structures, leak of surgical connections, which can lead to other surgeries and possibly an ostomy, possible need for other procedures, such as abscess drains in radiology, possible prolonged hospital stay, possible diarrhea from removal of part of the colon, possible constipation from narcotics, possible bowel, bladder or sexual dysfunction if having rectal surgery, prolonged fatigue/weakness or appetite loss, possible early recurrence of of disease, possible complications of their medical problems such as heart disease or arrhythmias or lung problems, death (less than 1%). I believe the patient understands and wishes to proceed with the surgery.      Vanita Panda, MD Colon and Rectal Surgery [facility]

## 2023-06-26 NOTE — Op Note (Signed)
06/26/2023  11:32 AM  PATIENT:  Carol Mack  52 y.o. female  Patient Care Team: [name], NP as PCP - General (Internal Medicine)  PRE-OPERATIVE DIAGNOSIS:  COLON CANCER  POST-OPERATIVE DIAGNOSIS:  COLON CANCER  PROCEDURE:   XI ROBOT ASSISTED PARTIAL COLECTOMY   Surgeon(s): [name], MD Andria Meuse, MD  ASSISTANT: Dr Cliffton Asters   ANESTHESIA:   local and general  EBL:  30 ml  Delay start of Pharmacological VTE agent (>24hrs) due to surgical blood loss or risk of bleeding:  no  DRAINS: none   SPECIMEN:  Source of Specimen:  Splenic flexure  DISPOSITION OF SPECIMEN:  PATHOLOGY  COUNTS:  YES  PLAN OF CARE: Admit to inpatient   PATIENT DISPOSITION:  PACU - hemodynamically stable.  INDICATION:    52 y.o. F with partially obstructing transverse colon cancer.  I recommended segmental resection:  The anatomy & physiology of the digestive tract was discussed.  The pathophysiology was discussed.  Natural history risks without surgery was discussed.   I worked to give an overview of the disease and the frequent need to have multispecialty involvement.  I feel the risks of no intervention will lead to serious problems that outweigh the operative risks; therefore, I recommended a partial colectomy to remove the pathology.  Laparoscopic & open techniques were discussed.   Risks such as bleeding, infection, abscess, leak, reoperation, possible ostomy, hernia, heart attack, death, and other risks were discussed.  I noted a good likelihood this will help address the problem.   Goals of post-operative recovery were discussed as well.    The patient expressed understanding & wished to proceed with surgery.  OR FINDINGS:   Patient had mass with proximal and distal tattoo at the splenic flexure.  Large mesenteric mass with biopsy showing adenocarcinoma.   No obvious metastatic disease on visceral parietal peritoneum or liver.   DESCRIPTION:   Informed  consent was confirmed.  The patient underwent general anaesthesia without difficulty.  The patient was positioned appropriately.  VTE prevention in place.  The patient's abdomen was clipped, prepped, & draped in a sterile fashion.  Surgical timeout confirmed our plan.  The patient was positioned in reverse Trendelenburg.  Abdominal entry was gained using a Varies needle in the LUQ.  Entry was clean.  I induced carbon dioxide insufflation.  An 8mm robotic port was placed in the RLQ.  Camera inspection revealed no injury.  Extra ports were carefully placed under direct laparoscopic visualization.  I laparoscopically reflected the greater omentum and the upper abdomen the small bowel in the upper abdomen. The patient was appropriately positioned and the robot was docked to the patient's right side.  Instruments were placed under direct visualization.  I began by identifying the mass with proximal distal tattoo.  This was noted in the distal transverse colon at the level of the splenic flexure.  There was a large mesenteric mass at the base of the middle colic artery.  Frozen specimen was sent and determined to be adenocarcinoma.  I began by separating the omentum from the colon using the robotic vessel sealer.  This was continued out to the splenic flexure.  I then entered into the lesser sac and divided this out to the level of the splenic flexure as well.  I continued this dissection down around the colon dividing the white line of Toldt descending down into the pelvis.  Once this was completed the remaining portions of the splenic flexure were mobilized off of  the retroperitoneum.  I then began dissecting out the mesentery.  I started on the right side of the mesenteric mass and carefully dissected around and under this area.  I was able to expose the IMV and takeoff of the splenic vein.  This was preserved.  I divided the middle colic artery and vein using the robotic vessel sealer.  I continued to divide the  mesentery over the ligament of Treitz.  I then continued this into the mesentery of the splenic flexure and divided the IMV at this level.  I continued dividing the mesentery out to the level of the proximal descending colon.  Once this was complete intravenous firefly was used to confirm good perfusion of the retroperitoneal structures, small bowel and remaining colon.  The colon was divided using a green load robotic 60 mm staplers at the demarcation sites of the firefly.  The specimen was then placed in the left upper quadrant.  The remaining omentum was resected as well, as it did not have good blood flow after our dissection.  Once this was complete the remaining transverse colon was arranged in overlapping isoperistaltic fashion.  An enterotomy was made in both sides of the colon using robotic scissors.  A white load robotic 60 mm stapler was then placed into the colon and anastomosis was created.  The common enterotomy Mack was closed using 2, 2-0 V-Loc sutures.  The abdomen was then irrigated with approximately 2 L of warm normal saline.  Hemostasis was good.  At this point the robot was undocked.  The 12 mm suprapubic port was enlarged to a Pfannenstiel incision and an Alexis wound protector was placed.  The colon was brought out through this as well as the omental specimen.  These were both sent to pathology for further examination.  The peritoneum of the Pfannenstiel incision was closed using a running 0 Vicryl suture.  The fascia was closed using 2, #1 Novafil sutures in a running fashion.  The subcutaneous tissue was closed over this using a running 2-0 Vicryl suture and the skin was closed using a running 4-0 Vicryl subcuticular suture.  A sterile dressing was applied.  The remaining port sites were closed using interrupted 2-0 Vicryl suture and Dermabond.  The patient was then awakened from anesthesia and sent to the postanesthesia care unit in stable condition.  All counts were correct per  operating room staff.    An MD assistant was necessary for tissue manipulation, retraction and positioning due to the complexity of the case and hospital policies   Vanita Panda, MD  Colorectal and General Surgery [facility]

## 2023-06-26 NOTE — Anesthesia Procedure Notes (Signed)
Procedure Name: Intubation Date/Time: 06/26/2023 11:34 AM  Performed by: Orest Dikes, CRNAPre-anesthesia Checklist: Patient identified, Emergency Drugs available, Suction available and Patient being monitored Patient Re-evaluated:Patient Re-evaluated prior to induction Oxygen Delivery Method: Circle system utilized Preoxygenation: Pre-oxygenation with 100% oxygen Induction Type: IV induction Ventilation: Mask ventilation without difficulty Laryngoscope Size: Mac and 4 Grade View: Grade II Tube type: Oral Tube size: 7.0 mm Number of attempts: 1 Airway Equipment and Method: Stylet Placement Confirmation: ETT inserted through vocal cords under direct vision, positive ETCO2 and breath sounds checked- equal and bilateral Secured at: 21 cm Tube secured with: Tape Dental Injury: Teeth and Oropharynx as per pre-operative assessment

## 2023-06-27 LAB — CBC
HCT: 33.8 % — ABNORMAL LOW (ref 36.0–46.0)
Hemoglobin: 11.4 g/dL — ABNORMAL LOW (ref 12.0–15.0)
MCH: 21.2 pg — ABNORMAL LOW (ref 26.0–34.0)
MCHC: 33.7 g/dL (ref 30.0–36.0)
MCV: 62.9 fL — ABNORMAL LOW (ref 80.0–100.0)
Platelets: 467 10*3/uL — ABNORMAL HIGH (ref 150–400)
RBC: 5.37 MIL/uL — ABNORMAL HIGH (ref 3.87–5.11)
RDW: 19.5 % — ABNORMAL HIGH (ref 11.5–15.5)
WBC: 30.5 10*3/uL — ABNORMAL HIGH (ref 4.0–10.5)
nRBC: 0 % (ref 0.0–0.2)

## 2023-06-27 LAB — BASIC METABOLIC PANEL
Anion gap: 9 (ref 5–15)
BUN: 10 mg/dL (ref 6–20)
CO2: 25 mmol/L (ref 22–32)
Calcium: 8.6 mg/dL — ABNORMAL LOW (ref 8.9–10.3)
Chloride: 99 mmol/L (ref 98–111)
Creatinine, Ser: 0.65 mg/dL (ref 0.44–1.00)
GFR, Estimated: >60 mL/min (ref >60)
Glucose, Bld: 155 mg/dL — ABNORMAL HIGH (ref 70–99)
Potassium: 4.3 mmol/L (ref 3.5–5.1)
Sodium: 133 mmol/L — ABNORMAL LOW (ref 135–145)

## 2023-06-27 NOTE — Progress Notes (Signed)
1 Day Post-Op Robotic Resection of Splenic Flexure Subjective: Doing well, feels sore, no nausea  Objective: Vital signs in last 24 hours: Temp:  [97 F (36.1 C)-98.6 F (37 C)] 98.6 F (37 C) (12/13 0925) Pulse Rate:  [65-91] 77 (12/13 0925) Resp:  [15-27] 17 (12/13 0925) BP: (125-171)/(84-108) 127/84 (12/13 0925) SpO2:  [90 %-100 %] 92 % (12/13 0925) Weight:  [57.4 kg-57.5 kg] 57.5 kg (12/13 0550)   Intake/Output from previous day: 12/12 0701 - 12/13 0700 In: 1876.2 [P.O.:360; I.V.:1416.2; IV Piggyback:100] Out: 1325 [Urine:1275; Blood:50] Intake/Output this shift: No intake/output data recorded.   General appearance: alert and cooperative GI: soft, non-distended  Incision: no significant drainage  Lab Results:  Recent Labs    06/27/23 0452  WBC 30.5*  HGB 11.4*  HCT 33.8*  PLT 467*   BMET Recent Labs    06/27/23 0452  NA 133*  K 4.3  CL 99  CO2 25  GLUCOSE 155*  BUN 10  CREATININE 0.65  CALCIUM 8.6*   PT/INR No results for input(s): "LABPROT", "INR" in the last 72 hours. ABG No results for input(s): "PHART", "HCO3" in the last 72 hours.  Invalid input(s): "PCO2", "PO2"  MEDS, Scheduled  acetaminophen  1,000 mg Oral Q6H   alvimopan  12 mg Oral BID   amLODipine  10 mg Oral Daily   enoxaparin (LOVENOX) injection  40 mg Subcutaneous Q24H   feeding supplement  237 mL Oral BID BM   gabapentin  300 mg Oral BID   saccharomyces boulardii  250 mg Oral BID    Studies/Results: No results found.  Assessment: s/p Procedure(s): XI ROBOT ASSISTED LAPAROSCOPIC PARTIAL COLECTOMY Patient Active Problem List   Diagnosis Date Noted   Colon polyp 06/26/2023   S/P laparoscopic hysterectomy 09/15/2018   Left breast lump 08/07/2018   Essential hypertension 05/28/2018   Cigarette nicotine dependence without complication 05/28/2018   Multinodular goiter 01/19/2016   Hyperthyroidism 09/25/2015    Expected post op course.  Pathology of metastatic lymph  nodes discussed with pt  Plan: d/c foley Advance diet as tolerated SL IVFs Ambulate in hall Poss d/c on Sun   LOS: 1 day     .Vanita Panda, MD [facility], [place]    06/27/2023 9:30 AM

## 2023-06-27 NOTE — Plan of Care (Signed)
  Problem: Education: Goal: Understanding of discharge needs will improve Outcome: Progressing Goal: Verbalization of understanding of the causes of altered bowel function will improve Outcome: Progressing   Problem: Activity: Goal: Ability to tolerate increased activity will improve Outcome: Progressing   Problem: Bowel/Gastric: Goal: Gastrointestinal status for postoperative course will improve Outcome: Progressing   Problem: Health Behavior/Discharge Planning: Goal: Identification of community resources to assist with postoperative recovery needs will improve Outcome: Progressing

## 2023-06-27 NOTE — TOC CM/SW Note (Signed)
Transition of Care [facility]) - Inpatient Brief Assessment   Patient Details  Name: Carol Mack MRN: [id number] Date of Birth: 05-13-1971  Transition of Care [facility]) CM/SW Contact:    Darleene Cleaver, LCSW Phone Number: 06/27/2023, 9:43 AM   Clinical Narrative:  Patient has insurance, she has a PCP, and does not have any SDOH needs.    CSW received a message from [employer].  Salvatore Marvel case manager, [phone] can help with DC needs and network providers.  No anticipated TOC needs.   Transition of Care Asessment: Insurance and Status: Insurance coverage has been reviewed Patient has primary care physician: Yes Home environment has been reviewed: Yes Prior level of function:: Indep Prior/Current Home Services: No current home services Social Drivers of Health Review: SDOH reviewed no interventions necessary Readmission risk has been reviewed: Yes Transition of care needs: no transition of care needs at this time

## 2023-06-28 ENCOUNTER — Inpatient Hospital Stay (HOSPITAL_COMMUNITY): Payer: Commercial Managed Care - PPO

## 2023-06-28 LAB — CBC
HCT: 31.8 % — ABNORMAL LOW (ref 36.0–46.0)
Hemoglobin: 10.9 g/dL — ABNORMAL LOW (ref 12.0–15.0)
MCH: 21.7 pg — ABNORMAL LOW (ref 26.0–34.0)
MCHC: 34.3 g/dL (ref 30.0–36.0)
MCV: 63.3 fL — ABNORMAL LOW (ref 80.0–100.0)
Platelets: 381 10*3/uL (ref 150–400)
RBC: 5.02 MIL/uL (ref 3.87–5.11)
RDW: 19.7 % — ABNORMAL HIGH (ref 11.5–15.5)
WBC: 28 10*3/uL — ABNORMAL HIGH (ref 4.0–10.5)
nRBC: 0 % (ref 0.0–0.2)

## 2023-06-28 LAB — BASIC METABOLIC PANEL
Anion gap: 8 (ref 5–15)
BUN: 10 mg/dL (ref 6–20)
CO2: 23 mmol/L (ref 22–32)
Calcium: 8.4 mg/dL — ABNORMAL LOW (ref 8.9–10.3)
Chloride: 101 mmol/L (ref 98–111)
Creatinine, Ser: 0.64 mg/dL (ref 0.44–1.00)
GFR, Estimated: >60 mL/min (ref >60)
Glucose, Bld: 72 mg/dL (ref 70–99)
Potassium: 4 mmol/L (ref 3.5–5.1)
Sodium: 132 mmol/L — ABNORMAL LOW (ref 135–145)

## 2023-06-28 LAB — GLUCOSE, CAPILLARY: Glucose-Capillary: 92 mg/dL (ref 70–99)

## 2023-06-28 MED ORDER — SODIUM CHLORIDE 0.9 % IV BOLUS
1000.0000 mL | Freq: Once | INTRAVENOUS | Status: AC
  Administered 2023-06-28: 1000 mL via INTRAVENOUS

## 2023-06-28 NOTE — Plan of Care (Signed)
  Problem: Skin Integrity: Goal: Will show signs of wound healing Outcome: Progressing   Problem: Clinical Measurements: Goal: Will remain free from infection Outcome: Progressing Goal: Cardiovascular complication will be avoided Outcome: Progressing   Problem: Coping: Goal: Level of anxiety will decrease Outcome: Progressing   Problem: Elimination: Goal: Will not experience complications related to urinary retention Outcome: Progressing   Problem: Safety: Goal: Ability to remain free from injury will improve Outcome: Progressing

## 2023-06-28 NOTE — Progress Notes (Signed)
2 Days Post-Op   Subjective/Chief Complaint: No complaints other than soreness   Objective: Vital signs in last 24 hours: Temp:  [98.8 F (37.1 C)] 98.8 F (37.1 C) (12/14 0530) Pulse Rate:  [90-103] 101 (12/14 0530) Resp:  [14-20] 17 (12/14 0530) BP: (113-125)/(77-91) 125/91 (12/14 0903) SpO2:  [82 %-91 %] 88 % (12/14 0530) Last BM Date : 06/26/23  Intake/Output from previous day: 12/13 0701 - 12/14 0700 In: 1452.5 [P.O.:870; I.V.:582.5] Out: 800 [Urine:800] Intake/Output this shift: No intake/output data recorded.  General appearance: alert and cooperative Resp: clear to auscultation bilaterally Cardio: regular rate and rhythm GI: Soft.  Mild tenderness.  Lab Results:  Recent Labs    06/27/23 0452 06/28/23 0517  WBC 30.5* 28.0*  HGB 11.4* 10.9*  HCT 33.8* 31.8*  PLT 467* 381   BMET Recent Labs    06/27/23 0452 06/28/23 0517  NA 133* 132*  K 4.3 4.0  CL 99 101  CO2 25 23  GLUCOSE 155* 72  BUN 10 10  CREATININE 0.65 0.64  CALCIUM 8.6* 8.4*   PT/INR No results for input(s): "LABPROT", "INR" in the last 72 hours. ABG No results for input(s): "PHART", "HCO3" in the last 72 hours.  Invalid input(s): "PCO2", "PO2"  Studies/Results: No results found.  Anti-infectives: Anti-infectives (From admission, onward)    Start     Dose/Rate Route Frequency Ordered Stop   06/26/23 2200  cefoTEtan (CEFOTAN) 2 g in sodium chloride 0.9 % 100 mL IVPB        2 g 200 mL/hr over 30 Minutes Intravenous Every 12 hours 06/26/23 1612 06/27/23 0700   06/26/23 0815  cefoTEtan (CEFOTAN) 2 g in sodium chloride 0.9 % 100 mL IVPB        2 g 200 mL/hr over 30 Minutes Intravenous On call to O.R. 06/26/23 0803 06/26/23 1623       Assessment/Plan: s/p Procedure(s): XI ROBOT ASSISTED LAPAROSCOPIC PARTIAL COLECTOMY (N/A) Advance diet Ambulate  LOS: 2 days    Chevis Pretty III 06/28/2023

## 2023-06-28 NOTE — Progress Notes (Signed)
Patient ambulated in the hallway with assistance.  Unsteady gait.  Two staff walked steadied patient to walk short distance back to room.  Patient nauseated and given Zofran.

## 2023-06-29 LAB — URINALYSIS, ROUTINE W REFLEX MICROSCOPIC
Bacteria, UA: NONE SEEN
Bilirubin Urine: NEGATIVE
Glucose, UA: NEGATIVE mg/dL
Hgb urine dipstick: NEGATIVE
Ketones, ur: 5 mg/dL — AB
Leukocytes,Ua: NEGATIVE
Nitrite: NEGATIVE
Protein, ur: 30 mg/dL — AB
Specific Gravity, Urine: 1.027 (ref 1.005–1.030)
pH: 5 (ref 5.0–8.0)

## 2023-06-29 LAB — BASIC METABOLIC PANEL
Anion gap: 6 (ref 5–15)
BUN: 14 mg/dL (ref 6–20)
CO2: 24 mmol/L (ref 22–32)
Calcium: 8.3 mg/dL — ABNORMAL LOW (ref 8.9–10.3)
Chloride: 103 mmol/L (ref 98–111)
Creatinine, Ser: 0.61 mg/dL (ref 0.44–1.00)
GFR, Estimated: >60 mL/min (ref >60)
Glucose, Bld: 91 mg/dL (ref 70–99)
Potassium: 3.6 mmol/L (ref 3.5–5.1)
Sodium: 133 mmol/L — ABNORMAL LOW (ref 135–145)

## 2023-06-29 LAB — CBC
HCT: 30 % — ABNORMAL LOW (ref 36.0–46.0)
Hemoglobin: 10.6 g/dL — ABNORMAL LOW (ref 12.0–15.0)
MCH: 22 pg — ABNORMAL LOW (ref 26.0–34.0)
MCHC: 35.3 g/dL (ref 30.0–36.0)
MCV: 62.4 fL — ABNORMAL LOW (ref 80.0–100.0)
Platelets: 380 10*3/uL (ref 150–400)
RBC: 4.81 MIL/uL (ref 3.87–5.11)
RDW: 19.2 % — ABNORMAL HIGH (ref 11.5–15.5)
WBC: 28.3 10*3/uL — ABNORMAL HIGH (ref 4.0–10.5)
nRBC: 0 % (ref 0.0–0.2)

## 2023-06-29 LAB — LACTIC ACID, PLASMA: Lactic Acid, Venous: 0.9 mmol/L (ref 0.5–1.9)

## 2023-06-29 LAB — LACTATE DEHYDROGENASE: LDH: 215 U/L — ABNORMAL HIGH (ref 98–192)

## 2023-06-29 MED ORDER — PIPERACILLIN-TAZOBACTAM 3.375 G IVPB
3.3750 g | Freq: Three times a day (TID) | INTRAVENOUS | Status: DC
  Administered 2023-06-29 – 2023-06-30 (×5): 3.375 g via INTRAVENOUS
  Filled 2023-06-29 (×5): qty 50

## 2023-06-29 NOTE — Progress Notes (Signed)
See vital signs for 2100 and 2200 via flowsheet. Placed call to rapid response nurse- Janelle and given report on pt having non productive cough, encouraged pt. To use incentive spirometer every hour when awake, oxygen sats 85 on room air; the application of o2 @2 -3lpm via [place] to get sats to 91%. Warm, dry, no visible distress. Have pt sitting up in recliner with feet elevated. Pt  having elevated heart rate of 120-130s and wbc count from this morning of 28.0. Informed rapid response that nurse would be calling provider on call. She said okay and to keep her updated. Nurse placed call to [facility] for provider on call at [phone]. Dr. Carolynne Edouard returned call. Informed him of the above information. Orders received for NS 1L bolus and to get chest x-ray. Inquired if he wanted to order a set of blood cultures at this time. He said "no".

## 2023-06-29 NOTE — Progress Notes (Signed)
Dr. Carolynne Edouard was paged and he returned call to nurse. Nurse shared results of chest x-ray that was ordered. Verbal orders received- Zosyn 3.375mg  IV Q8hours.

## 2023-06-29 NOTE — Plan of Care (Signed)
  Problem: Education: Goal: Understanding of discharge needs will improve Outcome: Progressing   Problem: Activity: Goal: Ability to tolerate increased activity will improve Outcome: Progressing

## 2023-06-29 NOTE — Progress Notes (Signed)
3 Days Post-Op   Subjective/Chief Complaint: Started on abx overnight, she is having flatus, no real complaints, does have a cough, voiding, hungry, wants to go home   Objective: Vital signs in last 24 hours: Temp:  [98.6 F (37 C)-101.2 F (38.4 C)] 98.6 F (37 C) (12/15 0425) Pulse Rate:  [95-129] 103 (12/15 0425) Resp:  [18-20] 18 (12/15 0425) BP: (113-133)/(77-96) 113/77 (12/15 0425) SpO2:  [87 %-99 %] 92 % (12/15 0425) Weight:  [59.5 kg] 59.5 kg (12/15 0500) Last BM Date : 06/26/23  Intake/Output from previous day: 12/14 0701 - 12/15 0700 In: 1728.2 [P.O.:720; IV Piggyback:8.2] Out: 850 [Urine:850] Intake/Output this shift: No intake/output data recorded.  General nad Cv low grade tachycardia Pulm no wheezes effort normal Ab dressing dry, soft approp tender without peritonitis, nondistended  Lab Results:  Recent Labs    06/28/23 0517 06/29/23 0448  WBC 28.0* 28.3*  HGB 10.9* 10.6*  HCT 31.8* 30.0*  PLT 381 380   BMET Recent Labs    06/28/23 0517 06/29/23 0448  NA 132* 133*  K 4.0 3.6  CL 101 103  CO2 23 24  GLUCOSE 72 91  BUN 10 14  CREATININE 0.64 0.61  CALCIUM 8.4* 8.3*   PT/INR No results for input(s): "LABPROT", "INR" in the last 72 hours. ABG No results for input(s): "PHART", "HCO3" in the last 72 hours.  Invalid input(s): "PCO2", "PO2"  Studies/Results: DG Chest 1 View Result Date: 06/28/2023 CLINICAL DATA:  Fever EXAM: CHEST  1 VIEW COMPARISON:  05/08/2023 FINDINGS: Interval development of a left-sided volume loss with elevation of the left hemidiaphragm and left basilar atelectasis. Previously noted right basilar pulmonary infiltrate has resolved. Right lung is clear. No pneumothorax. No pleural effusion. Cardiac size within normal limits. Pulmonary vascularity is normal. No acute bone abnormality. Deviation of the trachea to the right is unchanged, related to a thyroid goiter. IMPRESSION: 1. Interval development of left-sided volume loss  with elevation of the left hemidiaphragm and left basilar atelectasis. 2. Interval resolution of right basilar pulmonary infiltrate. Electronically Signed   By: Helyn Numbers M.D.   On: 06/28/2023 22:50    Anti-infectives: Anti-infectives (From admission, onward)    Start     Dose/Rate Route Frequency Ordered Stop   06/29/23 0100  piperacillin-tazobactam (ZOSYN) IVPB 3.375 g        3.375 g 12.5 mL/hr over 240 Minutes Intravenous Every 8 hours 06/29/23 0018     06/26/23 2200  cefoTEtan (CEFOTAN) 2 g in sodium chloride 0.9 % 100 mL IVPB        2 g 200 mL/hr over 30 Minutes Intravenous Every 12 hours 06/26/23 1612 06/27/23 0700   06/26/23 0815  cefoTEtan (CEFOTAN) 2 g in sodium chloride 0.9 % 100 mL IVPB        2 g 200 mL/hr over 30 Minutes Intravenous On call to O.R. 06/26/23 0803 06/26/23 1623       Assessment/Plan: POD 3 partial colectomy -fever overnight with elevated wbc (has been since surgery), she does have cough but cxr not really concerning, will check ua.  Could consider ct abdomen but early and her exam is not concerning and she is having flatus -will continue abx started I suppose -follow wbc and temp curve for now -will keep on fulls today -lovenox, scds, pulm toilet  Emelia Loron 06/29/2023

## 2023-06-30 ENCOUNTER — Inpatient Hospital Stay (HOSPITAL_COMMUNITY): Payer: Commercial Managed Care - PPO

## 2023-06-30 LAB — CBC
HCT: 29.2 % — ABNORMAL LOW (ref 36.0–46.0)
Hemoglobin: 10 g/dL — ABNORMAL LOW (ref 12.0–15.0)
MCH: 21.4 pg — ABNORMAL LOW (ref 26.0–34.0)
MCHC: 34.2 g/dL (ref 30.0–36.0)
MCV: 62.5 fL — ABNORMAL LOW (ref 80.0–100.0)
Platelets: 360 10*3/uL (ref 150–400)
RBC: 4.67 MIL/uL (ref 3.87–5.11)
RDW: 19 % — ABNORMAL HIGH (ref 11.5–15.5)
WBC: 28 10*3/uL — ABNORMAL HIGH (ref 4.0–10.5)
nRBC: 0 % (ref 0.0–0.2)

## 2023-06-30 MED ORDER — AMOXICILLIN-POT CLAVULANATE 875-125 MG PO TABS
1.0000 | ORAL_TABLET | Freq: Two times a day (BID) | ORAL | Status: DC
  Administered 2023-06-30: 1 via ORAL
  Filled 2023-06-30: qty 1

## 2023-06-30 MED ORDER — PIPERACILLIN-TAZOBACTAM 3.375 G IVPB
3.3750 g | Freq: Three times a day (TID) | INTRAVENOUS | Status: DC
  Administered 2023-06-30 – 2023-07-01 (×3): 3.375 g via INTRAVENOUS
  Filled 2023-06-30 (×2): qty 50

## 2023-06-30 NOTE — Plan of Care (Signed)
  Problem: Education: Goal: Understanding of discharge needs will improve Outcome: Progressing   Problem: Activity: Goal: Ability to tolerate increased activity will improve Outcome: Progressing

## 2023-06-30 NOTE — Progress Notes (Signed)
Patient temp is 100.6 F. Temperature in room was turned down and blankets were removed. Patient was encouraged to use her IS.

## 2023-06-30 NOTE — Progress Notes (Signed)
4 Days Post-Op Robotic Resection of Splenic Flexure Subjective: Doing well, feels sore, no nausea, tolerating fulls, had a BM  Objective: Vital signs in last 24 hours: Temp:  [98.4 F (36.9 C)-100.4 F (38 C)] 98.4 F (36.9 C) (12/16 0607) Pulse Rate:  [93-110] 95 (12/16 0607) Resp:  [16-20] 18 (12/16 0607) BP: (104-121)/(67-84) 116/67 (12/16 0607) SpO2:  [90 %-97 %] 96 % (12/16 0607)   Intake/Output from previous day: 12/15 0701 - 12/16 0700 In: 1057.3 [P.O.:900; IV Piggyback:157.3] Out: 1500 [Urine:1500] Intake/Output this shift: No intake/output data recorded.   General appearance: alert and cooperative GI: soft, non-distended Non-tender to palpation, mild crepitus in RUQ Incision: no significant drainage  Lab Results:  Recent Labs    06/29/23 0448 06/30/23 0438  WBC 28.3* 28.0*  HGB 10.6* 10.0*  HCT 30.0* 29.2*  PLT 380 360   BMET Recent Labs    06/28/23 0517 06/29/23 0448  NA 132* 133*  K 4.0 3.6  CL 101 103  CO2 23 24  GLUCOSE 72 91  BUN 10 14  CREATININE 0.64 0.61  CALCIUM 8.4* 8.3*   PT/INR No results for input(s): "LABPROT", "INR" in the last 72 hours. ABG No results for input(s): "PHART", "HCO3" in the last 72 hours.  Invalid input(s): "PCO2", "PO2"  MEDS, Scheduled  acetaminophen  1,000 mg Oral Q6H   amLODipine  10 mg Oral Daily   amoxicillin-clavulanate  1 tablet Oral Q12H   enoxaparin (LOVENOX) injection  40 mg Subcutaneous Q24H   feeding supplement  237 mL Oral BID BM   gabapentin  300 mg Oral BID   saccharomyces boulardii  250 mg Oral BID    Studies/Results: DG Chest 1 View Result Date: 06/28/2023 CLINICAL DATA:  Fever EXAM: CHEST  1 VIEW COMPARISON:  05/08/2023 FINDINGS: Interval development of a left-sided volume loss with elevation of the left hemidiaphragm and left basilar atelectasis. Previously noted right basilar pulmonary infiltrate has resolved. Right lung is clear. No pneumothorax. No pleural effusion. Cardiac size  within normal limits. Pulmonary vascularity is normal. No acute bone abnormality. Deviation of the trachea to the right is unchanged, related to a thyroid goiter. IMPRESSION: 1. Interval development of left-sided volume loss with elevation of the left hemidiaphragm and left basilar atelectasis. 2. Interval resolution of right basilar pulmonary infiltrate. Electronically Signed   By: Helyn Numbers M.D.   On: 06/28/2023 22:50    Assessment: s/p Procedure(s): XI ROBOT ASSISTED LAPAROSCOPIC PARTIAL COLECTOMY Patient Active Problem List   Diagnosis Date Noted   Colon polyp 06/26/2023   S/P laparoscopic hysterectomy 09/15/2018   Left breast lump 08/07/2018   Essential hypertension 05/28/2018   Cigarette nicotine dependence without complication 05/28/2018   Multinodular goiter 01/19/2016   Hyperthyroidism 09/25/2015    Pathology of metastatic lymph nodes discussed with pt  Plan:  Reg diet SL IVFs Pt on Zosyn for fever and wbc 28.  It does not appear that she has a known infection.  GI exam benign.  UA and CXR clear but her fever has decreased, so we will complete a course of PO Abx Ambulate in hall Recheck CBC in AM   LOS: 4 days     .Vanita Panda, MD [facility], [place]    06/30/2023 8:40 AM

## 2023-07-01 LAB — CBC
HCT: 30.4 % — ABNORMAL LOW (ref 36.0–46.0)
Hemoglobin: 10.1 g/dL — ABNORMAL LOW (ref 12.0–15.0)
MCH: 21.1 pg — ABNORMAL LOW (ref 26.0–34.0)
MCHC: 33.2 g/dL (ref 30.0–36.0)
MCV: 63.5 fL — ABNORMAL LOW (ref 80.0–100.0)
Platelets: 384 10*3/uL (ref 150–400)
RBC: 4.79 MIL/uL (ref 3.87–5.11)
RDW: 18.6 % — ABNORMAL HIGH (ref 11.5–15.5)
WBC: 21.8 10*3/uL — ABNORMAL HIGH (ref 4.0–10.5)
nRBC: 0 % (ref 0.0–0.2)

## 2023-07-01 LAB — SURGICAL PATHOLOGY

## 2023-07-01 MED ORDER — AZITHROMYCIN 250 MG PO TABS
250.0000 mg | ORAL_TABLET | Freq: Every day | ORAL | Status: DC
Start: 2023-07-01 — End: 2023-07-02
  Administered 2023-07-01 – 2023-07-02 (×2): 250 mg via ORAL
  Filled 2023-07-01 (×2): qty 1

## 2023-07-01 NOTE — Plan of Care (Signed)
TBD

## 2023-07-01 NOTE — Plan of Care (Signed)
  Problem: Education: Goal: Understanding of discharge needs will improve Outcome: Progressing   Problem: Activity: Goal: Ability to tolerate increased activity will improve Outcome: Progressing

## 2023-07-01 NOTE — H&P (View-Only) (Signed)
 5 Days Post-Op Robotic Resection of Splenic Flexure Subjective: Doing well, vomited after taking Augmentin yesterday, tolerating reg diet, had a BM  Objective: Vital signs in last 24 hours: Temp:  [97.9 F (36.6 C)-100.6 F (38.1 C)] 98.5 F (36.9 C) (12/17 0556) Pulse Rate:  [89-120] 92 (12/17 0556) Resp:  [16-20] 19 (12/17 0556) BP: (111-134)/(73-82) 114/81 (12/17 0556) SpO2:  [92 %-99 %] 92 % (12/17 0556) Weight:  [60.4 kg] 60.4 kg (12/17 0500)   Intake/Output from previous day: 12/16 0701 - 12/17 0700 In: 838.2 [P.O.:720; IV Piggyback:118.2] Out: 1200 [Urine:950; Emesis/NG output:250] Intake/Output this shift: No intake/output data recorded.   General appearance: alert and cooperative GI: soft, non-distended Lungs: minimal lung volumes on IS Non-tender to palpation, mild distention Incision: no significant drainage  Lab Results:  Recent Labs    06/30/23 0438 07/01/23 0445  WBC 28.0* 21.8*  HGB 10.0* 10.1*  HCT 29.2* 30.4*  PLT 360 384   BMET Recent Labs    06/29/23 0448  NA 133*  K 3.6  CL 103  CO2 24  GLUCOSE 91  BUN 14  CREATININE 0.61  CALCIUM 8.3*   PT/INR No results for input(s): "LABPROT", "INR" in the last 72 hours. ABG No results for input(s): "PHART", "HCO3" in the last 72 hours.  Invalid input(s): "PCO2", "PO2"  MEDS, Scheduled  acetaminophen  1,000 mg Oral Q6H   amLODipine  10 mg Oral Daily   azithromycin  250 mg Oral Daily   enoxaparin (LOVENOX) injection  40 mg Subcutaneous Q24H   feeding supplement  237 mL Oral BID BM   gabapentin  300 mg Oral BID   saccharomyces boulardii  250 mg Oral BID    Studies/Results: DG ABD ACUTE 2+V W 1V CHEST Result Date: 06/30/2023 CLINICAL DATA:  Vomiting. Per electronic medical records, recent abdominal surgery. EXAM: DG ABDOMEN ACUTE WITH 1 VIEW CHEST COMPARISON:  Radiograph 06/28/2023. Chest CT 05/14/2023. Abdominopelvic CT 05/11/2023 FINDINGS: Chronic rightward tracheal deviation due to  enlarged thyroid gland. Again seen emphysema. Increasing left basilar opacity from prior exam. Small left pleural effusion. Stable heart size and mediastinal contours. Free air under the right hemidiaphragm is similar to prior exam and not unexpected given recent surgery. Multiple enteric sutures in the central abdomen. Nonspecific air-filled loops of bowel in the right abdomen with air-fluid levels. Suspected prominent loop of colon centrally. No visible radiopaque calculi. IMPRESSION: 1. Increasing left basilar opacity from prior exam, may represent atelectasis or pneumonia. Small left pleural effusion. 2. Free air under the right hemidiaphragm is similar to prior exam and not unexpected given recent surgery. 3. Nonspecific air-filled loops of bowel in the right abdomen with air-fluid levels. Suspected prominent loop of colon centrally. Findings may represent postoperative ileus. Electronically Signed   By: Narda Rutherford M.D.   On: 06/30/2023 18:28    Assessment: s/p Procedure(s): XI ROBOT ASSISTED LAPAROSCOPIC PARTIAL COLECTOMY Patient Active Problem List   Diagnosis Date Noted   Colon polyp 06/26/2023   S/P laparoscopic hysterectomy 09/15/2018   Left breast lump 08/07/2018   Essential hypertension 05/28/2018   Cigarette nicotine dependence without complication 05/28/2018   Multinodular goiter 01/19/2016   Hyperthyroidism 09/25/2015    Pathology of metastatic lymph nodes discussed with pt  Plan:  Reg diet SL IVFs Pt on Zosyn for fever and wbc 28. GI exam benign and tolerating a diet. CXR initially looked ok but repeat shows atelectasis vs infiltrate. UA clear as well. Her fever has decreased since starting Zosyn, so we  will complete a course of PO Abx for assumed aspiration pneumonia: will try Azithromycin since she did not tolerate Augmentin  Ambulate in hall, Incentive Spirometry q1h while awake, OOB and in a chair Recheck CBC in AM Possible d/c tom if breathing better and wbc still  trending down   LOS: 5 days     .Vanita Panda, MD [facility], [place]    07/01/2023 8:22 AM

## 2023-07-01 NOTE — Progress Notes (Signed)
5 Days Post-Op Robotic Resection of Splenic Flexure Subjective: Doing well, vomited after taking Augmentin yesterday, tolerating reg diet, had a BM  Objective: Vital signs in last 24 hours: Temp:  [97.9 F (36.6 C)-100.6 F (38.1 C)] 98.5 F (36.9 C) (12/17 0556) Pulse Rate:  [89-120] 92 (12/17 0556) Resp:  [16-20] 19 (12/17 0556) BP: (111-134)/(73-82) 114/81 (12/17 0556) SpO2:  [92 %-99 %] 92 % (12/17 0556) Weight:  [60.4 kg] 60.4 kg (12/17 0500)   Intake/Output from previous day: 12/16 0701 - 12/17 0700 In: 838.2 [P.O.:720; IV Piggyback:118.2] Out: 1200 [Urine:950; Emesis/NG output:250] Intake/Output this shift: No intake/output data recorded.   General appearance: alert and cooperative GI: soft, non-distended Lungs: minimal lung volumes on IS Non-tender to palpation, mild distention Incision: no significant drainage  Lab Results:  Recent Labs    06/30/23 0438 07/01/23 0445  WBC 28.0* 21.8*  HGB 10.0* 10.1*  HCT 29.2* 30.4*  PLT 360 384   BMET Recent Labs    06/29/23 0448  NA 133*  K 3.6  CL 103  CO2 24  GLUCOSE 91  BUN 14  CREATININE 0.61  CALCIUM 8.3*   PT/INR No results for input(s): "LABPROT", "INR" in the last 72 hours. ABG No results for input(s): "PHART", "HCO3" in the last 72 hours.  Invalid input(s): "PCO2", "PO2"  MEDS, Scheduled  acetaminophen  1,000 mg Oral Q6H   amLODipine  10 mg Oral Daily   azithromycin  250 mg Oral Daily   enoxaparin (LOVENOX) injection  40 mg Subcutaneous Q24H   feeding supplement  237 mL Oral BID BM   gabapentin  300 mg Oral BID   saccharomyces boulardii  250 mg Oral BID    Studies/Results: DG ABD ACUTE 2+V W 1V CHEST Result Date: 06/30/2023 CLINICAL DATA:  Vomiting. Per electronic medical records, recent abdominal surgery. EXAM: DG ABDOMEN ACUTE WITH 1 VIEW CHEST COMPARISON:  Radiograph 06/28/2023. Chest CT 05/14/2023. Abdominopelvic CT 05/11/2023 FINDINGS: Chronic rightward tracheal deviation due to  enlarged thyroid gland. Again seen emphysema. Increasing left basilar opacity from prior exam. Small left pleural effusion. Stable heart size and mediastinal contours. Free air under the right hemidiaphragm is similar to prior exam and not unexpected given recent surgery. Multiple enteric sutures in the central abdomen. Nonspecific air-filled loops of bowel in the right abdomen with air-fluid levels. Suspected prominent loop of colon centrally. No visible radiopaque calculi. IMPRESSION: 1. Increasing left basilar opacity from prior exam, may represent atelectasis or pneumonia. Small left pleural effusion. 2. Free air under the right hemidiaphragm is similar to prior exam and not unexpected given recent surgery. 3. Nonspecific air-filled loops of bowel in the right abdomen with air-fluid levels. Suspected prominent loop of colon centrally. Findings may represent postoperative ileus. Electronically Signed   By: Narda Rutherford M.D.   On: 06/30/2023 18:28    Assessment: s/p Procedure(s): XI ROBOT ASSISTED LAPAROSCOPIC PARTIAL COLECTOMY Patient Active Problem List   Diagnosis Date Noted   Colon polyp 06/26/2023   S/P laparoscopic hysterectomy 09/15/2018   Left breast lump 08/07/2018   Essential hypertension 05/28/2018   Cigarette nicotine dependence without complication 05/28/2018   Multinodular goiter 01/19/2016   Hyperthyroidism 09/25/2015    Pathology of metastatic lymph nodes discussed with pt  Plan:  Reg diet SL IVFs Pt on Zosyn for fever and wbc 28. GI exam benign and tolerating a diet. CXR initially looked ok but repeat shows atelectasis vs infiltrate. UA clear as well. Her fever has decreased since starting Zosyn, so we  will complete a course of PO Abx for assumed aspiration pneumonia: will try Azithromycin since she did not tolerate Augmentin  Ambulate in hall, Incentive Spirometry q1h while awake, OOB and in a chair Recheck CBC in AM Possible d/c tom if breathing better and wbc still  trending down   LOS: 5 days     .Vanita Panda, MD [facility], [place]    07/01/2023 8:22 AM

## 2023-07-02 LAB — CBC
HCT: 32.9 % — ABNORMAL LOW (ref 36.0–46.0)
Hemoglobin: 11 g/dL — ABNORMAL LOW (ref 12.0–15.0)
MCH: 21.2 pg — ABNORMAL LOW (ref 26.0–34.0)
MCHC: 33.4 g/dL (ref 30.0–36.0)
MCV: 63.5 fL — ABNORMAL LOW (ref 80.0–100.0)
Platelets: 438 10*3/uL — ABNORMAL HIGH (ref 150–400)
RBC: 5.18 MIL/uL — ABNORMAL HIGH (ref 3.87–5.11)
RDW: 18.8 % — ABNORMAL HIGH (ref 11.5–15.5)
WBC: 19.8 10*3/uL — ABNORMAL HIGH (ref 4.0–10.5)
nRBC: 0 % (ref 0.0–0.2)

## 2023-07-02 MED ORDER — TRAMADOL HCL 50 MG PO TABS: 50.0000 mg | ORAL_TABLET | Freq: Four times a day (QID) | ORAL | 0 refills | Status: DC | PRN

## 2023-07-02 MED ORDER — AZITHROMYCIN 250 MG PO TABS: 250.0000 mg | ORAL_TABLET | Freq: Every day | ORAL | 0 refills | Status: DC

## 2023-07-02 NOTE — Discharge Instructions (Signed)
SURGERY: POST OP INSTRUCTIONS (Surgery for small bowel obstruction, colon resection, etc)   ######################################################################  EAT Gradually transition to a high fiber diet with a fiber supplement over the next few days after discharge  WALK Walk an hour a day.  Control your pain to do that.    CONTROL PAIN Control pain so that you can walk, sleep, tolerate sneezing/coughing, go up/down stairs.  HAVE A BOWEL MOVEMENT DAILY Keep your bowels regular to avoid problems.  OK to try a laxative to override constipation.  OK to use an antidairrheal to slow down diarrhea.  Call if not better after 2 tries  CALL IF YOU HAVE PROBLEMS/CONCERNS Call if you are still struggling despite following these instructions. Call if you have concerns not answered by these instructions  ######################################################################   DIET Follow a light diet the first few days at home.  Start with a bland diet such as soups, liquids, starchy foods, low fat foods, etc.  If you feel full, bloated, or constipated, stay on a ful liquid or pureed/blenderized diet for a few days until you feel better and no longer constipated. Be sure to drink plenty of fluids every day to avoid getting dehydrated (feeling dizzy, not urinating, etc.). Gradually add a fiber supplement to your diet over the next week.  Gradually get back to a regular solid diet.  Avoid fast food or heavy meals the first week as you are more likely to get nauseated. It is expected for your digestive tract to need a few months to get back to normal.  It is common for your bowel movements and stools to be irregular.  You will have occasional bloating and cramping that should eventually fade away.  Until you are eating solid food normally, off all pain medications, and back to regular activities; your bowels will not be normal. Focus on eating a low-fat, high fiber diet the rest of your life  (See Getting to Good Bowel Health, below).  CARE of your INCISION or WOUND  It is good for closed incisions and even open wounds to be washed every day.  Shower every day.  Short baths are fine.  Wash the incisions and wounds clean with soap & water.    You may leave closed incisions open to air if it is dry.   You may cover the incision with clean gauze & replace it after your daily shower for comfort.  STAPLES: You have skin staples.  Leave them in place & set up an appointment for them to be removed by a surgery office nurse ~10 days after surgery. = 1st week of January 2024    ACTIVITIES as tolerated Start light daily activities --- self-care, walking, climbing stairs-- beginning the day after surgery.  Gradually increase activities as tolerated.  Control your pain to be active.  Stop when you are tired.  Ideally, walk several times a day, eventually an hour a day.   Most people are back to most day-to-day activities in a few weeks.  It takes 4-8 weeks to get back to unrestricted, intense activity. If you can walk 30 minutes without difficulty, it is safe to try more intense activity such as jogging, treadmill, bicycling, low-impact aerobics, swimming, etc. Save the most intensive and strenuous activity for last (Usually 4-8 weeks after surgery) such as sit-ups, heavy lifting, contact sports, etc.  Refrain from any intense heavy lifting or straining until you are off narcotics for pain control.  You will have off days, but things should improve   week-by-week. DO NOT PUSH THROUGH PAIN.  Let pain be your guide: If it hurts to do something, don't do it.  Pain is your body warning you to avoid that activity for another week until the pain goes down. You may drive when you are no longer taking narcotic prescription pain medication, you can comfortably wear a seatbelt, and you can safely make sudden turns/stops to protect yourself without hesitating due to pain. You may have sexual intercourse when it  is comfortable. If it hurts to do something, stop.  MEDICATIONS Take your usually prescribed home medications unless otherwise directed.   Blood thinners:  Usually you can restart any strong blood thinners after the second postoperative day.  It is OK to take aspirin right away.     If you are on strong blood thinners (warfarin/Coumadin, Plavix, Xerelto, Eliquis, Pradaxa, etc), discuss with your surgeon, medicine PCP, and/or cardiologist for instructions on when to restart the blood thinner & if blood monitoring is needed (PT/INR blood check, etc).     PAIN CONTROL Pain after surgery or related to activity is often due to strain/injury to muscle, tendon, nerves and/or incisions.  This pain is usually short-term and will improve in a few months.  To help speed the process of healing and to get back to regular activity more quickly, DO THE FOLLOWING THINGS TOGETHER: Increase activity gradually.  DO NOT PUSH THROUGH PAIN Use Ice and/or Heat Try Gentle Massage and/or Stretching Take over the counter pain medication Take Narcotic prescription pain medication for more severe pain  Good pain control = faster recovery.  It is better to take more medicine to be more active than to stay in bed all day to avoid medications.  Increase activity gradually Avoid heavy lifting at first, then increase to lifting as tolerated over the next 6 weeks. Do not "push through" the pain.  Listen to your body and avoid positions and maneuvers than reproduce the pain.  Wait a few days before trying something more intense Walking an hour a day is encouraged to help your body recover faster and more safely.  Start slowly and stop when getting sore.  If you can walk 30 minutes without stopping or pain, you can try more intense activity (running, jogging, aerobics, cycling, swimming, treadmill, sex, sports, weightlifting, etc.) Remember: If it hurts to do it, then don't do it! Use Ice and/or Heat You will have swelling and  bruising around the incisions.  This will take several weeks to resolve. Ice packs or heating pads (6-8 times a day, 30-60 minutes at a time) will help sooth soreness & bruising. Some people prefer to use ice alone, heat alone, or alternate between ice & heat.  Experiment and see what works best for you.  Consider trying ice for the first few days to help decrease swelling and bruising; then, switch to heat to help relax sore spots and speed recovery. Shower every day.  Short baths are fine.  It feels good!  Keep the incisions and wounds clean with soap & water.   Try Gentle Massage and/or Stretching Massage at the area of pain many times a day Stop if you feel pain - do not overdo it Take over the counter pain medication This helps the muscle and nerve tissues become less irritable and calm down faster Choose ONE of the following over-the-counter anti-inflammatory medications: Acetaminophen 500mg tabs (Tylenol) 1-2 pills with every meal and just before bedtime (avoid if you have liver problems or if you have   acetaminophen in you narcotic prescription) Naproxen 220mg tabs (ex. Aleve, Naprosyn) 1-2 pills twice a day (avoid if you have kidney, stomach, IBD, or bleeding problems) Ibuprofen 200mg tabs (ex. Advil, Motrin) 3-4 pills with every meal and just before bedtime (avoid if you have kidney, stomach, IBD, or bleeding problems) Take with food/snack several times a day as directed for at least 2 weeks to help keep pain / soreness down & more manageable. Take Narcotic prescription pain medication for more severe pain A prescription for strong pain control is often given to you upon discharge (for example: oxycodone/Percocet, hydrocodone/Norco/Vicodin, or tramadol/Ultram) Take your pain medication as prescribed. Be mindful that most narcotic prescriptions contain Tylenol (acetaminophen) as well - avoid taking too much Tylenol. If you are having problems/concerns with the prescription medicine (does  not control pain, nausea, vomiting, rash, itching, etc.), please call us [phone] to see if we need to switch you to a different pain medicine that will work better for you and/or control your side effects better. If you need a refill on your pain medication, you must call the office before 4 pm and on weekdays only.  By federal law, prescriptions for narcotics cannot be called into a pharmacy.  They must be filled out on paper & picked up from our office by the patient or authorized caretaker.  Prescriptions cannot be filled after 4 pm nor on weekends.    WHEN TO CALL US [phone] Severe uncontrolled or worsening pain  Fever over 101 F (38.5 C) Concerns with the incision: Worsening pain, redness, rash/hives, swelling, bleeding, or drainage Reactions / problems with new medications (itching, rash, hives, nausea, etc.) Nausea and/or vomiting Difficulty urinating Difficulty breathing Worsening fatigue, dizziness, lightheadedness, blurred vision Other concerns If you are not getting better after two weeks or are noticing you are getting worse, contact our office [phone] for further advice.  We may need to adjust your medications, re-evaluate you in the office, send you to the emergency room, or see what other things we can do to help. The clinic staff is available to answer your questions during regular business hours (8:30am-5pm).  Please don't hesitate to call and ask to speak to one of our nurses for clinical concerns.    A surgeon from [facility] is always on call at the hospitals 24 hours/day If you have a medical emergency, go to the nearest emergency room or call 911.  FOLLOW UP in our office One the day of your discharge from the hospital (or the next business weekday), please call [facility] to set up or confirm an appointment to see your surgeon in the office for a follow-up appointment.  Usually it is 2-3 weeks after your surgery.   If you  have skin staples at your incision(s), let the office know so we can set up a time in the office for the nurse to remove them (usually around 10 days after surgery). Make sure that you call for appointments the day of discharge (or the next business weekday) from the hospital to ensure a convenient appointment time. IF YOU HAVE DISABILITY OR FAMILY LEAVE FORMS, BRING THEM TO THE OFFICE FOR PROCESSING.  DO NOT GIVE THEM TO YOUR DOCTOR.  [facility] [address], [address], [place], [place]  [address] ? [phone] - Main [phone] - Toll Free,  [phone] - Fax [email or web address]    GETTING TO GOOD BOWEL HEALTH. It is expected for your digestive tract to   need a few months to get back to normal.  It is common for your bowel movements and stools to be irregular.  You will have occasional bloating and cramping that should eventually fade away.  Until you are eating solid food normally, off all pain medications, and back to regular activities; your bowels will not be normal.   Avoiding constipation The goal: ONE SOFT BOWEL MOVEMENT A DAY!    Drink plenty of fluids.  Choose water first. TAKE A FIBER SUPPLEMENT EVERY DAY THE REST OF YOUR LIFE During your first week back home, gradually add back a fiber supplement every day Experiment which form you can tolerate.   There are many forms such as powders, tablets, wafers, gummies, etc Psyllium bran (Metamucil), methylcellulose (Citrucel), Miralax or Glycolax, Benefiber, Flax Seed.  Adjust the dose week-by-week (1/2 dose/day to 6 doses a day) until you are moving your bowels 1-2 times a day.  Cut back the dose or try a different fiber product if it is giving you problems such as diarrhea or bloating. Sometimes a laxative is needed to help jump-start bowels if constipated until the fiber supplement can help regulate your bowels.  If you are tolerating eating & you are farting, it is okay to try a gentle  laxative such as double dose MiraLax, prune juice, or Milk of Magnesia.  Avoid using laxatives too often. Stool softeners can sometimes help counteract the constipating effects of narcotic pain medicines.  It can also cause diarrhea, so avoid using for too long. If you are still constipated despite taking fiber daily, eating solids, and a few doses of laxatives, call our office. Controlling diarrhea Try drinking liquids and eating bland foods for a few days to avoid stressing your intestines further. Avoid dairy products (especially milk & ice cream) for a short time.  The intestines often can lose the ability to digest lactose when stressed. Avoid foods that cause gassiness or bloating.  Typical foods include beans and other legumes, cabbage, broccoli, and dairy foods.  Avoid greasy, spicy, fast foods.  Every person has some sensitivity to other foods, so listen to your body and avoid those foods that trigger problems for you. Probiotics (such as active yogurt, Align, etc) may help repopulate the intestines and colon with normal bacteria and calm down a sensitive digestive tract Adding a fiber supplement gradually can help thicken stools by absorbing excess fluid and retrain the intestines to act more normally.  Slowly increase the dose over a few weeks.  Too much fiber too soon can backfire and cause cramping & bloating. It is okay to try and slow down diarrhea with a few doses of antidiarrheal medicines.   Bismuth subsalicylate (ex. Kayopectate, Pepto Bismol) for a few doses can help control diarrhea.  Avoid if pregnant.   Loperamide (Imodium) can slow down diarrhea.  Start with one tablet (2mg) first.  Avoid if you are having fevers or severe pain.  ILEOSTOMY PATIENTS WILL HAVE CHRONIC DIARRHEA since their colon is not in use.    Drink plenty of liquids.  You will need to drink even more glasses of water/liquid a day to avoid getting dehydrated. Record output from your ileostomy.  Expect to empty  the bag every 3-4 hours at first.  Most people with a permanent ileostomy empty their bag 4-6 times at the least.   Use antidiarrheal medicine (especially Imodium) several times a day to avoid getting dehydrated.  Start with a dose at bedtime & breakfast.  Adjust up or   down as needed.  Increase antidiarrheal medications as directed to avoid emptying the bag more than 8 times a day (every 3 hours). Work with your wound ostomy nurse to learn care for your ostomy.  See ostomy care instructions. TROUBLESHOOTING IRREGULAR BOWELS 1) Start with a soft & bland diet. No spicy, greasy, or fried foods.  2) Avoid gluten/wheat or dairy products from diet to see if symptoms improve. 3) Miralax 17gm or flax seed mixed in 8oz. water or juice-daily. May use 2-4 times a day as needed. 4) Gas-X, Phazyme, etc. as needed for gas & bloating.  5) Prilosec (omeprazole) over-the-counter as needed 6)  Consider probiotics (Align, Activa, etc) to help calm the bowels down  Call your doctor if you are getting worse or not getting better.  Sometimes further testing (cultures, endoscopy, X-ray studies, CT scans, bloodwork, etc.) may be needed to help diagnose and treat the cause of the diarrhea. [facility] [address], [address], [place], [place]  [address] [phone] - Main.    [phone]  - Toll Free.   [phone] - Fax [email or web address]   ###############################   #######################################################  Ostomy Support Information  You've heard that people get along just fine with only one of their eyes, or one of their lungs, or one of their kidneys. But you also know that you have only one intestine and only one bladder, and that leaves you feeling awfully empty, both physically and emotionally: You think no other people go around without part of their intestine with the ends of their intestines sticking out through their abdominal walls.    YOU ARE NOT ALONE.  There are nearly three quarters of a million people in the [place] who have an ostomy; people who have had surgery to remove all or part of their colons or bladders.   There is even a national association, the [employer] with over 350 local affiliated support groups that are organized by volunteers who provide peer support and counseling. [employer] has a toll free telephone num-ber, 800-826-0826 and an educational, interactive website, www.ostomy.org   An ostomy is an opening in the belly (abdominal wall) made by surgery. Ostomates are people who have had this procedure. The opening (stoma) allows the kidney or bowel to grdischarge waste. An external pouch covers the stoma to collect waste. Pouches are are a simple bag and are odor free. Different companies have disposable or reusable pouches to fit one's lifestyle. An ostomy can either be temporary or permanent.   THERE ARE THREE MAIN TYPES OF OSTOMIES Colostomy. A colostomy is a surgically created opening in the large intestine (colon). Ileostomy. An ileostomy is a surgically created opening in the small intestine. Urostomy. A urostomy is a surgically created opening to divert urine away from the bladder.  OSTOMY Care  The following guidelines will make care of your colostomy easier. Keep this information close by for quick reference.  Helpful DIET hints Eat a well-balanced diet including vegetables and fresh fruits. Eat on a regular schedule.  Drink at least 6 to 8 glasses of fluids daily. Eat slowly in a relaxed atmosphere. Chew your food thoroughly. Avoid chewing gum, smoking, and drinking from a straw. This will help decrease the amount of air you swallow, which may help reduce gas. Eating yogurt or drinking buttermilk may help reduce gas.  To control gas at night, do not eat after 8 p.m. This will give your bowel time to quiet down before you go   to bed.  If gas is a problem, you can purchase  Beano. Sprinkle Beano on the first bite of food before eating to reduce gas. It has no flavor and should not change the taste of your food. You can buy Beano over the counter at your local drugstore.  Foods like fish, onions, garlic, broccoli, asparagus, and cabbage produce odor. Although your pouch is odor-proof, if you eat these foods you may notice a stronger odor when emptying your pouch. If this is a concern, you may want to limit these foods in your diet.  If you have an ileostomy, you will have chronic diarrhea & need to drink more liquids to avoid getting dehydrated.  Consider antidiarrheal medicine like imodium (loperamide) or Lomotil to help slow down bowel movements / diarrhea into your ileostomy bag.  GETTING TO GOOD BOWEL HEALTH WITH AN ILEOSTOMY    With the colon bypassed & not in use, you will have small bowel diarrhea.   It is important to thicken & slow your bowel movements down.   The goal: 4-6 small BOWEL MOVEMENTS A DAY It is important to drink plenty of liquids to avoid getting dehydrated  CONTROLLING ILEOSTOMY DIARRHEA  TAKE A FIBER SUPPLEMENT (FiberCon or Benefiner soluble fiber) twice a day - to thicken stools by absorbing excess fluid and retrain the intestines to act more normally.  Slowly increase the dose over a few weeks.  Too much fiber too soon can backfire and cause cramping & bloating.  TAKE AN IRON SUPPLEMENT twice a day to naturally constipate your bowels.  Usually ferrous sulfate 325mg twice a day)  TAKE ANTI-DIARRHEAL MEDICINES: Loperamide (Imodium) can slow down diarrhea.  Start with two tablets (= 4mg) first and then try one tablet every 6 hours.  Can go up to 2 pills four times day (8 pills of 2mg max) Avoid if you are having fevers or severe pain.  If you are not better or start feeling worse, stop all medicines and call your doctor for advice LoMotil (Diphenoxylate / Atropine) is another medicine that can constipate & slow down bowel moevements Pepto  Bismol (bismuth) can gently thicken bowels as well  If diarrhea is worse,: drink plenty of liquids and try simpler foods for a few days to avoid stressing your intestines further. Avoid dairy products (especially milk & ice cream) for a short time.  The intestines often can lose the ability to digest lactose when stressed. Avoid foods that cause gassiness or bloating.  Typical foods include beans and other legumes, cabbage, broccoli, and dairy foods.  Every person has some sensitivity to other foods, so listen to our body and avoid those foods that trigger problems for you.Call your doctor if you are getting worse or not better.  Sometimes further testing (cultures, endoscopy, X-ray studies, bloodwork, etc) may be needed to help diagnose and treat the cause of the diarrhea. Take extra anti-diarrheal medicines (maximum is 8 pills of 2mg loperamide a day)   Tips for POUCHING an OSTOMY   Changing Your Pouch The best time to change your pouch is in the morning, before eating or drinking anything. Your stoma can function at any time, but it will function more after eating or drinking.   Applying the pouching system  Place all your equipment close at hand before removing your pouch.  Wash your hands.  Stand or sit in front of a mirror. Use the position that works best for you. Remember that you must keep the skin around the stoma   wrinkle-free for a good seal.  Gently remove the used pouch (1-piece system) or the pouch and old wafer (2-piece system). Empty the pouch into the toilet. Save the closure clip to use again.  Wash the stoma itself and the skin around the stoma. Your stoma may bleed a little when being washed. This is normal. Rinse and pat dry. You may use a wash cloth or soft paper towels (like Bounty), mild soap (like Dial, Safeguard, or Ivory), and water. Avoid soaps that contain perfumes or lotions.  For a new pouch (1-piece system) or a new wafer (2-piece system), measure your  stoma using the stoma guide in each box of supplies.  Trace the shape of your stoma onto the back of the new pouch or the back of the new wafer. Cut out the opening. Remove the paper backing and set it aside.  Optional: Apply a skin barrier powder to surrounding skin if it is irritated (bare or weeping), and dust off the excess. Optional: Apply a skin-prep wipe (such as Skin Prep or All-Kare) to the skin around the stoma, and let it dry. Do not apply this solution if the skin is irritated (red, tender, or broken) or if you have shaved around the stoma. Optional: Apply a skin barrier paste (such as Stomahesive, Coloplast, or Premium) around the opening cut in the back of the pouch or wafer. Allow it to dry for 30 to 60 seconds.  Hold the pouch (1-piece system) or wafer (2-piece system) with the sticky side toward your body. Make sure the skin around the stoma is wrinkle-free. Center the opening on the stoma, then press firmly to your abdomen (Fig. 4). Look in the mirror to check if you are placing the pouch, or wafer, in the right position. For a 2-piece system, snap the pouch onto the wafer. Make sure it snaps into place securely.  Place your hand over the stoma and the pouch or wafer for about 30 seconds. The heat from your hand can help the pouch or wafer stick to your skin.  Add deodorant (such as Super Banish or Nullo) to your pouch. Other options include food extracts such as vanilla oil and peppermint extract. Add about 10 drops of the deodorant to the pouch. Then apply the closure clamp. Note: Do not use toxic  chemicals or commercial cleaning agents in your pouch. These substances may harm the stoma.  Optional: For extra seal, apply tape to all 4 sides around the pouch or wafer, as if you were framing a picture. You may use any brand of medical adhesive tape. Change your pouch every 5 to 7 days. Change it immediately if a leak occurs.  Wash your hands afterwards.  If you are wearing a  2-piece system, you may use 2 new pouches per week and alternate them. Rinse the pouch with mild soap and warm water and hang it to dry for the next day. Apply the fresh pouch. Alternate the 2 pouches like this for a week. After a week, change the wafer and begin with 2 new pouches. Place the old pouches in a plastic bag, and put them in the trash.   LIVING WITH AN OSTOMY  Emptying Your Pouch Empty your pouch when it is one-third full (of urine, stool, and/or gas). If you wait until your pouch is fuller than this, it will be more difficult to empty and more noticeable. When you empty your pouch, either put toilet paper in the toilet bowl first, or flush the   toilet while you empty the pouch. This will reduce splashing. You can empty the pouch between your legs or to one side while sitting, or while standing or stooping. If you have a 2-piece system, you can snap off the pouch to empty it. Remember that your stoma may function during this time. If you wish to rinse your pouch after you empty it, a turkey baster can be helpful. When using a baster, squirt water up into the pouch through the opening at the bottom. With a 2-piece system, you can snap off the pouch to rinse it. After rinsing  your pouch, empty it into the toilet. When rinsing your pouch at home, put a few granules of Dreft soap in the rinse water. This helps lubricate and freshen your pouch. The inside of your pouch can be sprayed with non-stick cooking oil (Pam spray). This may help reduce stool sticking to the inside of the pouch.  Bathing You may shower or bathe with your pouch on or off. Remember that your stoma may function during this time.  The materials you use to wash your stoma and the skin around it should be clean, but they do not need to be sterile.  Wearing Your Pouch During hot weather, or if you perspire a lot in general, wear a cover over your pouch. This may prevent a rash on your skin under the pouch. Pouch covers are  sold at ostomy supply stores. Wear the pouch inside your underwear for better support. Watch your weight. Any gain or loss of 10 to 15 pounds or more can change the way your pouch fits.  Going Away From Home A collapsible cup (like those that come in travel kits) or a soft plastic squirt bottle with a pull-up top (like a travel bottle for shampoo) can be used for rinsing your pouch when you are away from home. Tilt the opening of the pouch at an upward angle when using a cup to rinse.  Carry wet wipes or extra tissues to use in public bathrooms.  Carry an extra pouching system with you at all times.  Never keep ostomy supplies in the glove compartment of your car. Extreme heat or cold can damage the skin barriers and adhesive wafers on the pouch.  When you travel, carry your ostomy supplies with you at all times. Keep them within easy reach. Do not pack ostomy supplies in baggage that will be checked or otherwise separated from you, because your baggage might be lost. If you're traveling out of the country, it is helpful to have a letter stating that you are carrying ostomy supplies as a medical necessity.  If you need ostomy supplies while traveling, look in the yellow pages of the telephone book under "Surgical Supplies." Or call the local ostomy organization to find out where supplies are available.  Do not let your ostomy supplies get low. Always order new pouches before you use the last one.  Reducing Odor Limit foods such as broccoli, cabbage, onions, fish, and garlic in your diet to help reduce odor. Each time you empty your pouch, carefully clean the opening of the pouch, both inside and outside, with toilet paper. Rinse your pouch 1 or 2 times daily after you empty it (see directions for emptying your pouch and going away from home). Add deodorant (such as Super Banish or Nullo) to your pouch. Use air deodorizers in your bathroom. Do not add aspirin to your pouch. Even though  aspirin can help prevent odor, it   could cause ulcers on your stoma.  When to call the doctor Call the doctor if you have any of the following symptoms: Purple, black, or white stoma Severe cramps lasting more than 6 hours Severe watery discharge from the stoma lasting more than 6 hours No output from the colostomy for 3 days Excessive bleeding from your stoma Swelling of your stoma to more than 1/2-inch larger than usual Pulling inward of your stoma below skin level Severe skin irritation or deep ulcers Bulging or other changes in your abdomen  When to call your ostomy nurse Call your ostomy/enterostomal therapy (WOCN) nurse if any of the following occurs: Frequent leaking of your pouching system Change in size or appearance of your stoma, causing discomfort or problems with your pouch Skin rash or rawness Weight gain or loss that causes problems with your pouch     FREQUENTLY ASKED QUESTIONS   Why haven't you met any of these folks who have an ostomy?  Well, maybe you have! You just did not recognize them because an ostomy doesn't show. It can be kept secret if you wish. Why, maybe some of your best friends, office associates or neighbors have an ostomy ... you never can tell. People facing ostomy surgery have many quality-of-life questions like: Will you bulge? Smell? Make noises? Will you feel waste leaving your body? Will you be a captive of the toilet? Will you starve? Be a social outcast? Get/stay married? Have babies? Easily bathe, go swimming, bend over?  OK, let's look at what you can expect:   Will you bulge?  Remember, without part of the intestine or bladder, and its contents, you should have a flatter tummy than before. You can expect to wear, with little exception, what you wore before surgery ... and this in-cludes tight clothing and bathing suits.   Will you smell?  Today, thanks to modern odor proof pouching systems, you can walk into an ostomy support group  meeting and not smell anything that is foul or offensive. And, for those with an ileostomy or colostomy who are concerned about odor when emptying their pouch, there are in-pouch deodorants that can be used to eliminate any waste odors that may exist.   Will you make noises?  Everyone produces gas, especially if they are an air-swallower. But intestinal sounds that occur from time to time are no differ-ent than a gurgling tummy, and quite often your clothing will muffle any sounds.   Will you feel the waste discharges?  For those with a colostomy or ileostomy there might be a slight pressure when waste leaves your body, but understand that the intestines have no nerve endings, so there will be no unpleasant sensations. Those with a urostomy will probably be unaware of any kidney drainage.   Will you be a captive of the toilet?  Immediately post-op you will spend more time in the bathroom than you will after your body recovers from surgery. Every person is different, but on average those with an ileostomy or urostomy may empty their pouches 4 to 6 times a day; a little  less if you have a colostomy. The average wear time between pouch system changes is 3 to 5 days and the changing process should take less than 30 minutes.   Will I need to be on a special diet? Most people return to their normal diet when they have recovered from surgery. Be sure to chew your food well, eat a well-balanced diet and drink plenty of fluids. If   you experience problems with a certain food, wait a couple of weeks and try it again.  Will there be odor and noises? Pouching systems are designed to be odor-proof or odor-resistant. There are deodorants that can be used in the pouch. Medications are also available to help reduce odor. Limit gas-producing foods and carbonated beverages. You will experience less gas and fewer noises as you heal from surgery.  How much time will it take to care for my ostomy? At first, you may  spend a lot of time learning about your ostomy and how to take care of it. As you become more comfortable and skilled at changing the pouching system, it will take very little time to care for it.   Will I be able to return to work? People with ostomies can perform most jobs. As soon as you have healed from surgery, you should be able to return to work. Heavy lifting (more than 10 pounds) may be discouraged.   What about intimacy? Sexual relationships and intimacy are important and fulfilling aspects of your life. They should continue after ostomy surgery. Intimacy-related concerns should be discussed openly between you and your partner.   Can I wear regular clothing? You do not need to wear special clothing. Ostomy pouches are fairly flat and barely noticeable. Elastic undergarments will not hurt the stoma or prevent the ostomy from functioning.   Can I participate in sports? An ostomy should not limit your involvement in sports. Many people with ostomies are runners, skiers, swimmers or participate in other active lifestyles. Talk with your caregiver first before doing heavy physical activity.  Will you starve?  Not if you follow doctor's orders at each stage of your post-op adjustment. There is no such thing as an "ostomy diet". Some people with an ostomy will be able to eat and tolerate anything; others may find diffi-culty with some foods. Each person is an individual and must determine, by trial, what is best for them. A good practice for all is to drink plenty of water.   Will you be a social outcast?  Have you met anyone who has an ostomy and is a social outcast? Why should you be the first? Only your attitude and self image will effect how you are treated. No confi-dent person is an outcast.    PROFESSIONAL HELP   Resources are available if you need help or have questions about your ostomy.   Specially trained nurses called Wound, Ostomy Continence Nurses (WOCN) are available for  consultation in most major medical centers.  Consider getting an ostomy consult at an outpatient ostomy clinic.   [facility] has an [facility] Clinic run by an WOCN ostomy nurse at the [facility].  [phone]. [facility] can help set up an appointment   The United Ostomy Association (UOA) is a group made up of many local chapters throughout the [place]. These local groups hold meetings and provide support to prospective and existing ostomates. They sponsor educational events and have qualified visitors to make personal or telephone visits. Contact the UOA for the chapter nearest you and for other educational publications.  More detailed information can be found in Colostomy Guide, a publication of the United Ostomy Association (UOA). Contact UOA at 1-800-826-0826 or visit their web site at www.uoaa.org. The website contains links to other sites, suppliers and resources.  [facility] Secure Start Services: Start at the website to enlist for support.  Your Wound Ostomy (WOCN) nurse may have started this   process. https://www.[employer].com/en/securestart Secure Start services are designed to support people as they live their lives with an ostomy or neurogenic bladder. Enrolling is easy and at no cost to the patient. We realize that each person's needs and life journey are different. Through Secure Start services, we want to help people live their life, their way.  #######################################################  

## 2023-07-02 NOTE — Plan of Care (Signed)
  Problem: Education: Goal: Understanding of discharge needs will improve Outcome: Adequate for Discharge Goal: Verbalization of understanding of the causes of altered bowel function will improve Outcome: Adequate for Discharge   Problem: Activity: Goal: Ability to tolerate increased activity will improve Outcome: Adequate for Discharge   Problem: Bowel/Gastric: Goal: Gastrointestinal status for postoperative course will improve Outcome: Adequate for Discharge   Problem: Health Behavior/Discharge Planning: Goal: Identification of community resources to assist with postoperative recovery needs will improve Outcome: Adequate for Discharge   Problem: Nutritional: Goal: Will attain and maintain optimal nutritional status will improve Outcome: Adequate for Discharge   Problem: Clinical Measurements: Goal: Postoperative complications will be avoided or minimized Outcome: Adequate for Discharge   Problem: Respiratory: Goal: Respiratory status will improve Outcome: Adequate for Discharge   Problem: Skin Integrity: Goal: Will show signs of wound healing Outcome: Adequate for Discharge   Problem: Education: Goal: Knowledge of General Education information will improve Description: Including pain rating scale, medication(s)/side effects and non-pharmacologic comfort measures Outcome: Adequate for Discharge   Problem: Health Behavior/Discharge Planning: Goal: Ability to manage health-related needs will improve Outcome: Adequate for Discharge   Problem: Clinical Measurements: Goal: Ability to maintain clinical measurements within normal limits will improve Outcome: Adequate for Discharge Goal: Will remain free from infection Outcome: Adequate for Discharge Goal: Diagnostic test results will improve Outcome: Adequate for Discharge Goal: Cardiovascular complication will be avoided Outcome: Adequate for Discharge   Problem: Activity: Goal: Risk for activity intolerance will  decrease Outcome: Adequate for Discharge   Problem: Nutrition: Goal: Adequate nutrition will be maintained Outcome: Adequate for Discharge   Problem: Coping: Goal: Level of anxiety will decrease Outcome: Adequate for Discharge   Problem: Elimination: Goal: Will not experience complications related to bowel motility Outcome: Adequate for Discharge Goal: Will not experience complications related to urinary retention Outcome: Adequate for Discharge   Problem: Pain Management: Goal: General experience of comfort will improve Outcome: Adequate for Discharge   Problem: Safety: Goal: Ability to remain free from injury will improve Outcome: Adequate for Discharge   Problem: Skin Integrity: Goal: Risk for impaired skin integrity will decrease Outcome: Adequate for Discharge

## 2023-07-02 NOTE — Discharge Summary (Signed)
Physician Discharge Summary  Patient ID: Carol Mack MRN: [id number] DOB/AGE: May 27, 1971 52 y.o.  Admit date: 06/26/2023 Discharge date: 07/02/2023  Admission Diagnoses: Colon cancer Discharge Diagnoses:  Colon cancer metastasized to local lymph nodes   Discharged Condition: good  Hospital Course: Patient was admitted to the med surg floor after surgery.  Diet was advanced as tolerated. She developed a colonic ileus and then an episode of hypoxia, fever and tachycardia on POD 2 that was felt to be an aspiration event.  CXR 2 days later showed a developing infiltrate.  Patient began to have bowel function on postop day 4.  By postop day 6, she was tolerating a solid diet and pain was controlled with oral medications.  She was urinating without difficulty and ambulating without assistance.  Patient was felt to be in stable condition for discharge to home.   Consults: None  Significant Diagnostic Studies: labs: cbc, bmet and radiology: CXR: atelectasis bilaterally and KUB: showing a possible LLL infiltrate and RLL atelectasis  Treatments: antibiotics: Zosyn for aspiration pneuomnia  Discharge Exam: Blood pressure 113/85, pulse 98, temperature 99.3 F (37.4 C), temperature source Oral, resp. rate 16, height 5\' 4"  (1.626 m), weight 60.4 kg, SpO2 97%. General appearance: alert and cooperative GI: soft, mildly distended Incision/Wound: clean, dry, intact  Disposition: Discharge disposition: 01-Home or Self Care        Allergies as of 07/02/2023       Reactions   Percocet [oxycodone-acetaminophen] Anxiety, Other (See Comments)   Made the patient feel "jittery"        Medication List     TAKE these medications    albuterol 108 (90 Base) MCG/ACT inhaler Commonly known as: VENTOLIN HFA Inhale 1-2 puffs into the lungs every 6 (six) hours as needed for shortness of breath.   amLODipine 10 MG tablet Commonly known as: NORVASC Take 10 mg by mouth daily.    amLODipine-olmesartan 10-40 MG tablet Commonly known as: AZOR Take 1 tablet by mouth daily.   azithromycin 250 MG tablet Commonly known as: ZITHROMAX Take 1 tablet (250 mg total) by mouth daily.   Nasacort Allergy 24HR 55 MCG/ACT Aero nasal inhaler Generic drug: triamcinolone Place 1 spray into the nose daily as needed (congestion).   polyethylene glycol 17 g packet Commonly known as: MiraLax Take 17 g by mouth 2 (two) times daily.   traMADol 50 MG tablet Commonly known as: ULTRAM Take 1-2 tablets (50-100 mg total) by mouth every 6 (six) hours as needed for moderate pain (pain score 4-6).        Follow-up Information     [name], MD. Schedule an appointment as soon as possible for a visit in 2 week(s).   Specialties: General Surgery, Colon and Rectal Surgery Contact information: [address] [place] [address] [phone]                 Signed: Vanita Panda 07/02/2023, 8:38 AM

## 2023-07-03 ENCOUNTER — Telehealth: Payer: Self-pay

## 2023-07-03 NOTE — Transitions of Care (Post Inpatient/ED Visit) (Signed)
   07/03/2023  Name: Carol Mack MRN: [id number] DOB: [birth year]  Today's TOC FU Call Status: Today's TOC FU Call Status:: Unsuccessful Call (2nd Attempt) Unsuccessful Call (1st Attempt) Date: 07/03/23 Unsuccessful Call (2nd Attempt) Date: 07/03/23  Attempted to reach the patient regarding the most recent Inpatient/ED visit.  Follow Up Plan: Additional outreach attempts will be made to reach the patient to complete the Transitions of Care (Post Inpatient/ED visit) call.   Signature Robyne Peers, RN

## 2023-07-03 NOTE — Transitions of Care (Post Inpatient/ED Visit) (Signed)
   07/03/2023  Name: Angellina Rupnow MRN: [id number] DOB: 01/12/1971  Today's TOC FU Call Status: Today's TOC FU Call Status:: Unsuccessful Call (1st Attempt) Unsuccessful Call (1st Attempt) Date: 07/03/23  Attempted to reach the patient regarding the most recent Inpatient/ED visit.  Follow Up Plan: Additional outreach attempts will be made to reach the patient to complete the Transitions of Care (Post Inpatient/ED visit) call.   Signature  Robyne Peers, RN

## 2023-07-04 ENCOUNTER — Telehealth: Payer: Self-pay

## 2023-07-04 NOTE — Transitions of Care (Post Inpatient/ED Visit) (Signed)
   07/04/2023  Name: Carol Mack MRN: [id number] DOB: May 31, 1971  Today's TOC FU Call Status: Today's TOC FU Call Status:: Unsuccessful Call (3rd Attempt) Unsuccessful Call (3rd Attempt) Date: 07/04/23  Attempted to reach the patient regarding the most recent Inpatient/ED visit.  Follow Up Plan: No further outreach attempts will be made at this time. We have been unable to contact the patient.  Signature Elsie Lincoln, RN Triage Nurse [facility]

## 2023-07-08 ENCOUNTER — Encounter: Payer: Self-pay | Admitting: *Deleted

## 2023-07-08 NOTE — Progress Notes (Signed)
Patient's daughter Carol Mack called to get general information regarding financial assistance since mom had cancer surgery and will be starting chemotherapy. Introduced myself as Dance movement psychotherapist and discussed possibly applying for [employer] and [employer] if interested due to lack of income.   Advised once chemotherapy or radiation become part of her treatment plan, other services may be available through Child psychotherapist. Briefly discussed [employer] grant should she become approved for government assistance such as food stamps. Advised she may apply online or in person at [facility] DSS. She verbalized understanding.  She has my contact name and number for any additional financial questions or concerns.

## 2023-07-14 ENCOUNTER — Encounter (INDEPENDENT_AMBULATORY_CARE_PROVIDER_SITE_OTHER): Payer: Self-pay | Admitting: Primary Care

## 2023-07-14 ENCOUNTER — Ambulatory Visit (INDEPENDENT_AMBULATORY_CARE_PROVIDER_SITE_OTHER): Payer: Commercial Managed Care - PPO | Admitting: Primary Care

## 2023-07-14 VITALS — BP 117/87 | HR 100 | Resp 16 | Wt 109.2 lb

## 2023-07-14 DIAGNOSIS — I10 Essential (primary) hypertension: Secondary | ICD-10-CM | POA: Diagnosis not present

## 2023-07-14 DIAGNOSIS — R1084 Generalized abdominal pain: Secondary | ICD-10-CM

## 2023-07-18 ENCOUNTER — Encounter: Payer: Self-pay | Admitting: Gastroenterology

## 2023-07-18 ENCOUNTER — Other Ambulatory Visit (INDEPENDENT_AMBULATORY_CARE_PROVIDER_SITE_OTHER): Payer: Medicaid Other

## 2023-07-18 ENCOUNTER — Ambulatory Visit (INDEPENDENT_AMBULATORY_CARE_PROVIDER_SITE_OTHER)
Admission: RE | Admit: 2023-07-18 | Discharge: 2023-07-18 | Disposition: A | Discharge status: Home / Self Care | Payer: Medicaid Other | Source: Ambulatory Visit | Attending: Gastroenterology | Admitting: Gastroenterology

## 2023-07-18 ENCOUNTER — Ambulatory Visit (INDEPENDENT_AMBULATORY_CARE_PROVIDER_SITE_OTHER): Payer: Medicaid Other | Admitting: Gastroenterology

## 2023-07-18 VITALS — BP 110/70 | HR 102 | Ht 64.0 in | Wt 109.1 lb

## 2023-07-18 DIAGNOSIS — R1084 Generalized abdominal pain: Secondary | ICD-10-CM | POA: Diagnosis not present

## 2023-07-18 DIAGNOSIS — C189 Malignant neoplasm of colon, unspecified: Secondary | ICD-10-CM

## 2023-07-18 DIAGNOSIS — R634 Abnormal weight loss: Secondary | ICD-10-CM | POA: Diagnosis not present

## 2023-07-18 DIAGNOSIS — R430 Anosmia: Secondary | ICD-10-CM | POA: Diagnosis not present

## 2023-07-18 LAB — COMPREHENSIVE METABOLIC PANEL
ALT: 5 U/L (ref 0–35)
AST: 11 U/L (ref 0–37)
Albumin: 3.6 g/dL (ref 3.5–5.2)
Alkaline Phosphatase: 82 U/L (ref 39–117)
BUN: 11 mg/dL (ref 6–23)
CO2: 30 meq/L (ref 19–32)
Calcium: 9.3 mg/dL (ref 8.4–10.5)
Chloride: 100 meq/L (ref 96–112)
Creatinine, Ser: 0.63 mg/dL (ref 0.40–1.20)
GFR: 101.6 mL/min (ref >60.00)
Glucose, Bld: 126 mg/dL — ABNORMAL HIGH (ref 70–99)
Potassium: 3.9 meq/L (ref 3.5–5.1)
Sodium: 138 meq/L (ref 135–145)
Total Bilirubin: 0.3 mg/dL (ref 0.2–1.2)
Total Protein: 7.6 g/dL (ref 6.0–8.3)

## 2023-07-18 LAB — CBC WITH DIFFERENTIAL/PLATELET
Basophils Absolute: 0.1 10*3/uL (ref 0.0–0.1)
Basophils Relative: 1.4 % (ref 0.0–3.0)
Eosinophils Absolute: 0.1 10*3/uL (ref 0.0–0.7)
Eosinophils Relative: 1.5 % (ref 0.0–5.0)
HCT: 32.6 % — ABNORMAL LOW (ref 36.0–46.0)
Hemoglobin: 10.9 g/dL — ABNORMAL LOW (ref 12.0–15.0)
Lymphocytes Relative: 29.9 % (ref 12.0–46.0)
Lymphs Abs: 2.7 10*3/uL (ref 0.7–4.0)
MCHC: 33.4 g/dL (ref 30.0–36.0)
MCV: 63.9 fL — ABNORMAL LOW (ref 78.0–100.0)
Monocytes Absolute: 1.1 10*3/uL — ABNORMAL HIGH (ref 0.1–1.0)
Monocytes Relative: 11.8 % (ref 3.0–12.0)
Neutro Abs: 5 10*3/uL (ref 1.4–7.7)
Neutrophils Relative %: 55.4 % (ref 43.0–77.0)
Platelets: 606 10*3/uL — ABNORMAL HIGH (ref 150.0–400.0)
RBC: 5.1 Mil/uL (ref 3.87–5.11)
RDW: 19 % — ABNORMAL HIGH (ref 11.5–15.5)
WBC: 9.1 10*3/uL (ref 4.0–10.5)

## 2023-07-18 NOTE — Patient Instructions (Signed)
 Your provider has requested that you go to the basement level for lab work before leaving today. Press B on the elevator. The lab is located at the first door on the left as you exit the elevator.   Your provider has requested that you have an abdominal x ray before leaving today. Please go to the basement floor to our Radiology department for the test.  _______________________________________________________  If your blood pressure at your visit was 140/90 or greater, please contact your primary care physician to follow up on this.  _______________________________________________________  If you are age 76 or older, your body mass index should be between 23-30. Your Body mass index is 18.73 kg/m. If this is out of the aforementioned range listed, please consider follow up with your Primary Care Provider.  If you are age 81 or younger, your body mass index should be between 19-25. Your Body mass index is 18.73 kg/m. If this is out of the aformentioned range listed, please consider follow up with your Primary Care Provider.   ________________________________________________________  The [facility] providers would like to encourage you to use MYCHART to communicate with providers for non-urgent requests or questions.  Due to long hold times on the telephone, sending your provider a message by [facility] may be a faster and more efficient way to get a response.  Please allow 48 business hours for a response.  Please remember that this is for non-urgent requests.   It was a pleasure to see you today!  Thank you for trusting me with your gastrointestinal care!    Scott E.Stacia, MD

## 2023-07-18 NOTE — Progress Notes (Signed)
 Discussed the use of AI scribe software for clinical note transcription with the patient, who gave verbal consent to proceed.  HPI : Carol Mack is a 53 y.o. female with recently diagnosed colon cancer s/p partial colectomy who presents for further evaluation of abdominal pain.   She was last seen by me on October 31 when I did her colonoscopy revealing a partially obstructive colonic mass. She underwent a robot-assisted partial colectomy on December 12 by Dr. Debby.  A primary anastomosis was performed.  Her postop course was complicated by colonic ileus and an episode of hypoxia, fever and tachycardia felt to be secondary to an aspiration event.  She had return of bowel function on postop day 4, and by postop day 6 she was tolerating a solid diet and discharged home.  She was seen by her PCP on December 30 with symptoms of abdominal pain and hard stools.  She was taken MiraLAX  twice daily.  No imaging or interventions were done by her PCP at that time.   The pain is located at the incision sites and is constant, causing discomfort when lying flat and interrupting sleep. The patient reports no relief from prescribed Tramadol  or over-the-counter Tylenol . The pain has been persistent since her surgery and is not new.  Since the surgery, the patient has experienced a significant change in her ability to taste and smell, which has affected her appetite and ability to eat. She reports an aversion to most foods, with the exception of pickles and certain smoothies. She denies any dysphagia.  She is able to chew and swallow without difficulty, and there is no associated vomiting unless the food is particularly unpalatable.  She denies any recent COVID infection or recent symptoms suggestive of viral illness, other than the post op fever thought to be secondary to aspiration.  The patient has been supplementing her diet with protein shakes and Ensures, consuming two to three per day. Despite this, she  has experienced significant weight loss, dropping from an initial weight of 140 lbs prior to her cancer diagnosis to 109 lbs today.  She weighed 122lbs when I saw her in October.  The patient also reports regular bowel movements, occurring once a day, and denies problems with hard stools, straining, diarrhea or blood in the stool.  No fevers or chills.  The patient has not yet followed up with her surgeon, Dr. Debby, since the surgery, but she has appointment scheduled later this month.        HPI from Oct 29  The patient states that she was in her usual state of health and still she started having issues with abdominal pain  in September  She went to the emergency room on September 15, the day after the pain started.  A CT obtained at that ER visit showed a finding concerning for colon cancer.  A referral to GI was reportedly placed at that time.  She returned to the ED 2 days later with ongoing abdominal pain.  She was given some oxycodone  to help with pain. She then saw her PCP on September 30 who placed a referral to gastroenterology (referral was placed as routine). She reported to the emergency department again October 24 with ongoing constant abdominal pain and poor p.o. intake.  She was given refills of her analgesia and antiemetic therapy.   On October 25, I received a message from our schedulers that this patient had a referral for a colonic mass.  The patient was then given this  clinic appointment outside of our template for expedited further evaluation of colon mass. She reported to the ED again on October 27 with same symptoms and a CT scan again demonstrated a suspected distal transverse colon mass with luminal narrowing and prominence of colonic gas proximal to this lesion, but without evidence of obstruction.  There was a indistinct but stable 6 mm lesion in the liver, possibly representing metastasis and some small lymph nodes in the porta hepatis.   Past Medical History:   Diagnosis Date   Anemia    Asthma    Bartholin cyst    Chiari malformation type I (HCC)    Hernia, inguinal    Hernia, inguinal    Hypertension    ILD (interstitial lung disease) (HCC)      Past Surgical History:  Procedure Laterality Date   fibroid freezing     unsure   IR GENERIC HISTORICAL  07/27/2014   IR RADIOLOGIST EVAL & MGMT 07/27/2014 Juliene Balder, MD [facility] INTERV RAD   ROBOTIC ASSISTED TOTAL HYSTERECTOMY N/A 09/15/2018   Procedure: XI ROBOTIC ASSISTED TOTAL HYSTERECTOMY WITH SALPINGECTOMY;  Surgeon: Corene Coy, MD;  Location: [facility];  Service: Gynecology;  Laterality: N/A;   Family History  Problem Relation Age of Onset   Thyroid  disease Mother        large benign goiter   Colon cancer Neg Hx    Esophageal cancer Neg Hx    Rectal cancer Neg Hx    Stomach cancer Neg Hx    Social History   Tobacco Use   Smoking status: Former    Current packs/day: 1.00    Average packs/day: 1 pack/day for 29.0 years (29.0 ttl pk-yrs)    Types: Cigarettes    Start date: 1996    Passive exposure: Current   Smokeless tobacco: Never  Vaping Use   Vaping status: Never Used  Substance Use Topics   Alcohol use: No   Drug use: No   Current Outpatient Medications  Medication Sig Dispense Refill   albuterol  (VENTOLIN  HFA) 108 (90 Base) MCG/ACT inhaler Inhale 1-2 puffs into the lungs every 6 (six) hours as needed for shortness of breath. 2 each 1   azithromycin  (ZITHROMAX ) 250 MG tablet Take 1 tablet (250 mg total) by mouth daily. 4 each 0   polyethylene glycol (MIRALAX ) 17 g packet Take 17 g by mouth 2 (two) times daily. (Patient not taking: Reported on 06/22/2023) 30 each 0   traMADol  (ULTRAM ) 50 MG tablet Take 1-2 tablets (50-100 mg total) by mouth every 6 (six) hours as needed for moderate pain (pain score 4-6). 30 tablet 0   triamcinolone (NASACORT ALLERGY 24HR) 55 MCG/ACT AERO nasal inhaler Place 1 spray into the nose daily as needed (congestion).      No current facility-administered medications for this visit.   Allergies  Allergen Reactions   Percocet [Oxycodone -Acetaminophen ] Anxiety and Other (See Comments)    Made the patient feel jittery     Review of Systems: All systems reviewed and negative except where noted in HPI.    DG ABD ACUTE 2+V W 1V CHEST Result Date: 06/30/2023 CLINICAL DATA:  Vomiting. Per electronic medical records, recent abdominal surgery. EXAM: DG ABDOMEN ACUTE WITH 1 VIEW CHEST COMPARISON:  Radiograph 06/28/2023. Chest CT 05/14/2023. Abdominopelvic CT 05/11/2023 FINDINGS: Chronic rightward tracheal deviation due to enlarged thyroid  gland. Again seen emphysema. Increasing left basilar opacity from prior exam. Small left pleural effusion. Stable heart size and mediastinal contours. Free air under the  right hemidiaphragm is similar to prior exam and not unexpected given recent surgery. Multiple enteric sutures in the central abdomen. Nonspecific air-filled loops of bowel in the right abdomen with air-fluid levels. Suspected prominent loop of colon centrally. No visible radiopaque calculi. IMPRESSION: 1. Increasing left basilar opacity from prior exam, may represent atelectasis or pneumonia. Small left pleural effusion. 2. Free air under the right hemidiaphragm is similar to prior exam and not unexpected given recent surgery. 3. Nonspecific air-filled loops of bowel in the right abdomen with air-fluid levels. Suspected prominent loop of colon centrally. Findings may represent postoperative ileus. Electronically Signed   By: Andrea Gasman M.D.   On: 06/30/2023 18:28   DG Chest 1 View Result Date: 06/28/2023 CLINICAL DATA:  Fever EXAM: CHEST  1 VIEW COMPARISON:  05/08/2023 FINDINGS: Interval development of a left-sided volume loss with elevation of the left hemidiaphragm and left basilar atelectasis. Previously noted right basilar pulmonary infiltrate has resolved. Right lung is clear. No pneumothorax. No pleural  effusion. Cardiac size within normal limits. Pulmonary vascularity is normal. No acute bone abnormality. Deviation of the trachea to the right is unchanged, related to a thyroid  goiter. IMPRESSION: 1. Interval development of left-sided volume loss with elevation of the left hemidiaphragm and left basilar atelectasis. 2. Interval resolution of right basilar pulmonary infiltrate. Electronically Signed   By: Dorethia Molt M.D.   On: 06/28/2023 22:50    Physical Exam: BP 110/70 (BP Location: Right Arm, Patient Position: Sitting, Cuff Size: Normal)   Pulse (!) 102   Ht 5' 4 (1.626 m)   Wt 109 lb 2 oz (49.5 kg)   LMP  (LMP Unknown)   SpO2 98%   BMI 18.73 kg/m  Constitutional: Pleasant,well-developed, thin African American female in no acute distress.  Accompanied by mother and daughter. HEENT: Normocephalic and atraumatic. Conjunctivae are normal. No scleral icterus. Neck supple.  Cardiovascular: Normal rate, regular rhythm.  Pulmonary/chest: Effort normal and breath sounds normal. No wheezing, rales or rhonchi. Abdominal: Soft, nondistended, diffuse, tenderness to palpation in the lower abdomen and over incision sites without rigidity or guarding.  Laparoscopy port scars well healed, no erythematous. Bowel sounds active throughout. There are no masses palpable. No hepatomegaly. Extremities: no edema Lymphadenopathy: No cervical adenopathy noted. Neurological: Alert and oriented to person place and time. Skin: Skin is warm and dry. No rashes noted. Psychiatric: Normal mood and affect. Behavior is normal.  CBC    Component Value Date/Time   WBC 19.8 (H) 07/02/2023 0508   RBC 5.18 (H) 07/02/2023 0508   HGB 11.0 (L) 07/02/2023 0508   HCT 32.9 (L) 07/02/2023 0508   PLT 438 (H) 07/02/2023 0508   MCV 63.5 (L) 07/02/2023 0508   MCH 21.2 (L) 07/02/2023 0508   MCHC 33.4 07/02/2023 0508   RDW 18.8 (H) 07/02/2023 0508   LYMPHSABS 2.3 06/22/2023 0119   MONOABS 1.1 (H) 06/22/2023 0119   EOSABS  0.2 06/22/2023 0119   BASOSABS 0.0 06/22/2023 0119    CMP     Component Value Date/Time   NA 133 (L) 06/29/2023 0448   K 3.6 06/29/2023 0448   CL 103 06/29/2023 0448   CO2 24 06/29/2023 0448   GLUCOSE 91 06/29/2023 0448   BUN 14 06/29/2023 0448   CREATININE 0.61 06/29/2023 0448   CALCIUM  8.3 (L) 06/29/2023 0448   PROT 6.7 06/22/2023 0119   ALBUMIN 2.9 (L) 06/22/2023 0119   AST 13 (L) 06/22/2023 0119   ALT 10 06/22/2023 0119   ALKPHOS 58  06/22/2023 0119   BILITOT 0.3 06/22/2023 0119   GFRNONAA >60 06/29/2023 0448   GFRAA >60 06/09/2019 1613       Latest Ref Rng & Units 07/02/2023    5:08 AM 07/01/2023    4:45 AM 06/30/2023    4:38 AM  CBC EXTENDED  WBC 4.0 - 10.5 K/uL 19.8  21.8  28.0   RBC 3.87 - 5.11 MIL/uL 5.18  4.79  4.67   Hemoglobin 12.0 - 15.0 g/dL 88.9  89.8  89.9   HCT 36.0 - 46.0 % 32.9  30.4  29.2   Platelets 150 - 400 K/uL 438  384  360       ASSESSMENT AND PLAN:  53 year old female with colon cancer s/p partial colectomy Dec 12 with persistent vague abdominal pain, anosmia, and poor PO intake.  Postoperative Abdominal Pain Persistent abdominal pain localized to incision sites, unrelieved by tramadol  or acetaminophen . Low suspicion for surgical complication.  Emphasized the importance of surgical follow-up for potential further imaging or interventions. Discussed potential need for CT scan if pain persists. Although patient reports regular bowel movements, constipation can be source of diffuse abdominal pain, poor appetite/nausea.  Will get plain film to assess colonic stool burden.  Recommend daily MiraLax  - Order abdominal x-ray - Order CBC and CMP - Alternate tramadol  and acetaminophen  for pain management - Consider ibuprofen  at night for pain relief - Daily MiraLax   Nutritional Deficiency Significant weight loss since in the past few months related to cancer, surgery and ongoing poor PO intake. See above regarding possible role of constipation.   Consuming 2-3 Ensure shakes daily with some solid foods but experiencing early satiety and lack of taste. Emphasized maintaining nutritional intake for recovery. - Encourage continued use of protein shakes and nutritional supplements - Monitor weight and nutritional intake - Abdominal film as above - CMP  Loss of Taste and Smell Loss of taste and smell post-surgery, with no recent COVID-19 symptoms. Patient states that this started after surgery.  Unclear how surgery/anesthesia would cause anosmia. - Consider referral to ENT for evaluation of taste and smell loss if not improving  Colon cancer s/p resection - Follow up with Dr. Debby; will likely need oncology evaluation/follow up.     Nekita Pita E. Stacia, MD [facility]    Celestia Rosaline SQUIBB, NP

## 2023-07-21 ENCOUNTER — Other Ambulatory Visit: Payer: Self-pay

## 2023-07-21 MED ORDER — FERROUS SULFATE 325 (65 FE) MG PO TABS: 325.0000 mg | ORAL_TABLET | Freq: Every day | ORAL | 0 refills | Status: AC

## 2023-07-21 NOTE — Progress Notes (Signed)
 Ms. Camerer,  Your labs looked good overall.  Your hemoglobin remains slightly low, although it is stable.  You would likely benefit from iron supplementation.  Will prescribe ferrous sulfate  325 mg once daily.  Please take this every day.  It may cause your stools to turn black and sometimes causes GI upset and constipation.  I would continue to take the MiraLax  daily.  Rock,  Can you please prescribe ferrous sulfate  325 mg PO daily #90 rf0?

## 2023-07-23 ENCOUNTER — Other Ambulatory Visit: Payer: Self-pay | Admitting: *Deleted

## 2023-07-23 NOTE — Progress Notes (Signed)
 The proposed treatment discussed in conference is for discussion purpose only and is not a binding recommendation.  The patients have not been physically examined, or presented with their treatment options.  Therefore, final treatment plans cannot be decided.

## 2023-07-25 ENCOUNTER — Encounter: Payer: Self-pay | Admitting: Hematology

## 2023-07-25 ENCOUNTER — Inpatient Hospital Stay: Payer: Medicaid Other | Attending: Hematology

## 2023-07-25 ENCOUNTER — Inpatient Hospital Stay (HOSPITAL_BASED_OUTPATIENT_CLINIC_OR_DEPARTMENT_OTHER): Payer: Medicaid Other | Admitting: Hematology

## 2023-07-25 VITALS — BP 127/100 | HR 103 | Temp 98.4°F | Resp 16 | Wt 109.9 lb

## 2023-07-25 DIAGNOSIS — D649 Anemia, unspecified: Secondary | ICD-10-CM | POA: Diagnosis not present

## 2023-07-25 DIAGNOSIS — G8918 Other acute postprocedural pain: Secondary | ICD-10-CM | POA: Diagnosis not present

## 2023-07-25 DIAGNOSIS — C772 Secondary and unspecified malignant neoplasm of intra-abdominal lymph nodes: Secondary | ICD-10-CM | POA: Diagnosis not present

## 2023-07-25 DIAGNOSIS — Z5111 Encounter for antineoplastic chemotherapy: Secondary | ICD-10-CM | POA: Diagnosis present

## 2023-07-25 DIAGNOSIS — C189 Malignant neoplasm of colon, unspecified: Secondary | ICD-10-CM | POA: Insufficient documentation

## 2023-07-25 DIAGNOSIS — I10 Essential (primary) hypertension: Secondary | ICD-10-CM | POA: Insufficient documentation

## 2023-07-25 DIAGNOSIS — Z87891 Personal history of nicotine dependence: Secondary | ICD-10-CM | POA: Diagnosis not present

## 2023-07-25 DIAGNOSIS — C787 Secondary malignant neoplasm of liver and intrahepatic bile duct: Secondary | ICD-10-CM | POA: Diagnosis not present

## 2023-07-25 DIAGNOSIS — N898 Other specified noninflammatory disorders of vagina: Secondary | ICD-10-CM | POA: Diagnosis not present

## 2023-07-25 DIAGNOSIS — Z90710 Acquired absence of both cervix and uterus: Secondary | ICD-10-CM | POA: Diagnosis not present

## 2023-07-25 DIAGNOSIS — Z9079 Acquired absence of other genital organ(s): Secondary | ICD-10-CM | POA: Insufficient documentation

## 2023-07-25 LAB — CBC WITH DIFFERENTIAL/PLATELET
Abs Immature Granulocytes: 0.02 10*3/uL (ref 0.00–0.07)
Basophils Absolute: 0.1 10*3/uL (ref 0.0–0.1)
Basophils Relative: 1 %
Eosinophils Absolute: 0.2 10*3/uL (ref 0.0–0.5)
Eosinophils Relative: 3 %
HCT: 32.7 % — ABNORMAL LOW (ref 36.0–46.0)
Hemoglobin: 11.1 g/dL — ABNORMAL LOW (ref 12.0–15.0)
Immature Granulocytes: 0 %
Lymphocytes Relative: 29 %
Lymphs Abs: 2.7 10*3/uL (ref 0.7–4.0)
MCH: 20.5 pg — ABNORMAL LOW (ref 26.0–34.0)
MCHC: 33.9 g/dL (ref 30.0–36.0)
MCV: 60.4 fL — ABNORMAL LOW (ref 80.0–100.0)
Monocytes Absolute: 0.9 10*3/uL (ref 0.1–1.0)
Monocytes Relative: 10 %
Neutro Abs: 5.3 10*3/uL (ref 1.7–7.7)
Neutrophils Relative %: 57 %
Platelets: 398 10*3/uL (ref 150–400)
RBC: 5.41 MIL/uL — ABNORMAL HIGH (ref 3.87–5.11)
RDW: 18.8 % — ABNORMAL HIGH (ref 11.5–15.5)
Smear Review: NORMAL
WBC: 9.3 10*3/uL (ref 4.0–10.5)
nRBC: 0 % (ref 0.0–0.2)

## 2023-07-25 LAB — COMPREHENSIVE METABOLIC PANEL
ALT: 7 U/L (ref 0–44)
AST: 12 U/L — ABNORMAL LOW (ref 15–41)
Albumin: 3.5 g/dL (ref 3.5–5.0)
Alkaline Phosphatase: 76 U/L (ref 38–126)
Anion gap: 7 (ref 5–15)
BUN: 8 mg/dL (ref 6–20)
CO2: 28 mmol/L (ref 22–32)
Calcium: 9.5 mg/dL (ref 8.9–10.3)
Chloride: 105 mmol/L (ref 98–111)
Creatinine, Ser: 0.58 mg/dL (ref 0.44–1.00)
GFR, Estimated: >60 mL/min (ref >60)
Glucose, Bld: 110 mg/dL — ABNORMAL HIGH (ref 70–99)
Potassium: 3.7 mmol/L (ref 3.5–5.1)
Sodium: 140 mmol/L (ref 135–145)
Total Bilirubin: 0.3 mg/dL (ref 0.0–1.2)
Total Protein: 7.4 g/dL (ref 6.5–8.1)

## 2023-07-25 LAB — FOLATE: Folate: 10.5 ng/mL (ref >5.9)

## 2023-07-25 LAB — FERRITIN: Ferritin: 19 ng/mL (ref 11–307)

## 2023-07-25 LAB — VITAMIN B12: Vitamin B-12: 757 pg/mL (ref 180–914)

## 2023-07-25 MED ORDER — ONDANSETRON HCL 8 MG PO TABS: 8.0000 mg | ORAL_TABLET | Freq: Three times a day (TID) | ORAL | 1 refills | Status: DC | PRN

## 2023-07-25 MED ORDER — PROCHLORPERAZINE MALEATE 10 MG PO TABS: 10.0000 mg | ORAL_TABLET | Freq: Four times a day (QID) | ORAL | 1 refills | Status: DC | PRN

## 2023-07-25 MED ORDER — LIDOCAINE-PRILOCAINE 2.5-2.5 % EX CREA: TOPICAL_CREAM | CUTANEOUS | 3 refills | Status: DC

## 2023-07-25 NOTE — Progress Notes (Signed)
 Karalee Wilkie POUR, MD  Hildreth Robart Discussed with Dr. Lanny.  Very small 8 mm lesion in segment 2 of the liver.  Challenging bx but she wants us  to try.  Ok to cancel if no visible target at time of biopsy.  HKM       Previous Messages    ----- Message ----- From: Durenda Pechacek Sent: 07/25/2023  12:52 PM EST To: Lucine Bilski; Ir Procedure Requests Subject: US  Biopsy liver                                Procedure : US  liver biopsy  Reason : confirm liver metastasis Dx: Colon cancer metastasized to mesenteric lymph nodes (HCC) [C18.9, C77.2 (ICD-10-CM)]    History : MR liver , xray abd, CT abd pev  Provider: Lanny Callander, MD  Provider contact : [phone]

## 2023-07-25 NOTE — Progress Notes (Signed)
START ON PATHWAY REGIMEN - Colorectal     A cycle is every 14 days:     Bevacizumab-xxxx      Oxaliplatin      Leucovorin      Fluorouracil      Fluorouracil   **Always confirm dose/schedule in your pharmacy ordering system**  Patient Characteristics: Distant Metastases, Nonsurgical Candidate, Non-KRAS G12C, RAS Mutation Positive/Unknown (BRAF V600 Wild-Type/Unknown), Standard Cytotoxic Therapy, First Line Standard Cytotoxic Therapy, Bevacizumab Eligible, PS = 0,1 Tumor Location: Colon Therapeutic Status: Distant Metastases Microsatellite/Mismatch Repair Status: MSS/pMMR BRAF Mutation Status: Awaiting Test Results KRAS/NRAS Mutation Status: Awaiting Test Results Preferred Therapy Approach: Standard Cytotoxic Therapy Standard Cytotoxic Line of Therapy: First Line Standard Cytotoxic Therapy ECOG Performance Status: 1 Bevacizumab Eligibility: Eligible Intent of Therapy: Non-Curative / Palliative Intent, Discussed with Patient

## 2023-07-25 NOTE — Progress Notes (Signed)
 [facility]   [phone] [phone]   Clinic New Consult Note   Patient Care Team: Celestia Rosaline SQUIBB, NP as PCP - General (Internal Medicine) 07/25/2023  CHIEF COMPLAINTS/PURPOSE OF CONSULTATION:  Newly diagnosed colon cancer  REFERRING PHYSICIAN: Dr. Debby  Discussed the use of AI scribe software for clinical note transcription with the patient, who gave verbal consent to proceed.  History of Present Illness   The patient, a 53 year old with a history of hypertension, presents with recently diagnosed colon cancer.  She was referred by her surgeon Dr. Debby.  She presented to clinic with her daughter and cousin.    She first started experiencing abdominal pain in September of the previous year. A colonoscopy revealed a tumor in the left side of her colon. She underwent surgery in December to remove the tumor. However, she continues to experience abdominal pain, particularly at night. The pain is described as cramp-like and appears to be new since the surgery.  In addition to the pain, the patient has been struggling with a lack of appetite and significant weight loss. Prior to her surgery, she was on a liquid diet and has had difficulty transitioning back to regular food. Her taste buds have been off since the surgery, leading to nausea when she tries to eat. She has tried nutritional supplements like Ensure Boost, but has not been taking them recently. She has lost approximately 30-40 pounds and has not been able to regain any weight. Her energy levels have been fluctuating, but she has been able to move around more compared to the previous week.  The patient has a history of smoking but quit when her current health issues began. She does not consume alcohol. She has a family history of cancer, with her maternal uncles having had cancer, although the type is unknown.         MEDICAL HISTORY:  Past Medical History:  Diagnosis Date   Anemia     Asthma    Bartholin cyst    Chiari malformation type I (HCC)    Hernia, inguinal    Hernia, inguinal    Hypertension    ILD (interstitial lung disease) (HCC)     SURGICAL HISTORY: Past Surgical History:  Procedure Laterality Date   fibroid freezing     unsure   IR GENERIC HISTORICAL  07/27/2014   IR RADIOLOGIST EVAL & MGMT 07/27/2014 Juliene Balder, MD [facility] INTERV RAD   ROBOTIC ASSISTED TOTAL HYSTERECTOMY N/A 09/15/2018   Procedure: XI ROBOTIC ASSISTED TOTAL HYSTERECTOMY WITH SALPINGECTOMY;  Surgeon: Corene Coy, MD;  Location: [facility] [place];  Service: Gynecology;  Laterality: N/A;    SOCIAL HISTORY: Social History   Socioeconomic History   Marital status: Single    Spouse name: Not on file   Number of children: 2   Years of education: Not on file   Highest education level: Not on file  Occupational History   Not on file  Tobacco Use   Smoking status: Former    Current packs/day: 1.00    Average packs/day: 1 pack/day for 29.0 years (29.0 ttl pk-yrs)    Types: Cigarettes    Start date: 1996    Passive exposure: Current   Smokeless tobacco: Never  Vaping Use   Vaping status: Never Used  Substance and Sexual Activity   Alcohol use: No   Drug use: No   Sexual activity: Not on file  Other Topics Concern   Not on file  Social History Narrative  Not on file   Social Drivers of Health   Financial Resource Strain: Not on file  Food Insecurity: No Food Insecurity (06/26/2023)   Hunger Vital Sign    Worried About Running Out of Food in the Last Year: Never true    Ran Out of Food in the Last Year: Never true  Transportation Needs: No Transportation Needs (06/26/2023)   PRAPARE - Administrator, Civil Service (Medical): No    Lack of Transportation (Non-Medical): No  Physical Activity: Not on file  Stress: Not on file  Social Connections: Not on file  Intimate Partner Violence: Patient Declined (06/26/2023)   Humiliation, Afraid, Rape,  and Kick questionnaire    Fear of Current or Ex-Partner: Patient declined    Emotionally Abused: Patient declined    Physically Abused: Patient declined    Sexually Abused: Patient declined    FAMILY HISTORY: Family History  Problem Relation Age of Onset   Thyroid  disease Mother        large benign goiter   Cancer Maternal Uncle        UNKNOWN TYPE CANCER   Colon cancer Neg Hx    Esophageal cancer Neg Hx    Rectal cancer Neg Hx    Stomach cancer Neg Hx     ALLERGIES:  is allergic to percocet [oxycodone -acetaminophen ].  MEDICATIONS:  Current Outpatient Medications  Medication Sig Dispense Refill   albuterol  (VENTOLIN  HFA) 108 (90 Base) MCG/ACT inhaler Inhale 1-2 puffs into the lungs every 6 (six) hours as needed for shortness of breath. 2 each 1   azithromycin  (ZITHROMAX ) 250 MG tablet Take 1 tablet (250 mg total) by mouth daily. 4 each 0   ferrous sulfate  325 (65 FE) MG tablet Take 1 tablet (325 mg total) by mouth daily with breakfast. 90 tablet 0   lidocaine -prilocaine  (EMLA ) cream Apply to affected area once 30 g 3   ondansetron  (ZOFRAN ) 8 MG tablet Take 1 tablet (8 mg total) by mouth every 8 (eight) hours as needed for nausea or vomiting. Start on the third day after chemotherapy. 30 tablet 1   polyethylene glycol (MIRALAX ) 17 g packet Take 17 g by mouth 2 (two) times daily. 30 each 0   prochlorperazine  (COMPAZINE ) 10 MG tablet Take 1 tablet (10 mg total) by mouth every 6 (six) hours as needed for nausea or vomiting. 30 tablet 1   traMADol  (ULTRAM ) 50 MG tablet Take 1-2 tablets (50-100 mg total) by mouth every 6 (six) hours as needed for moderate pain (pain score 4-6). 30 tablet 0   triamcinolone (NASACORT ALLERGY 24HR) 55 MCG/ACT AERO nasal inhaler Place 1 spray into the nose daily as needed (congestion).     No current facility-administered medications for this visit.    REVIEW OF SYSTEMS:   Constitutional: Denies fevers, chills or abnormal night sweats, (+) fatigue and a  total of 30 pound weight loss Eyes: Denies blurriness of vision, double vision or watery eyes Ears, nose, mouth, throat, and face: Denies mucositis or sore throat Respiratory: Denies cough, dyspnea or wheezes Cardiovascular: Denies palpitation, chest discomfort or lower extremity swelling Gastrointestinal:  Denies nausea, heartburn or change in bowel habits Skin: Denies abnormal skin rashes Lymphatics: Denies new lymphadenopathy or easy bruising Neurological:Denies numbness, tingling or new weaknesses Behavioral/Psych: Mood is stable, no new changes  All other systems were reviewed with the patient and are negative.  PHYSICAL EXAMINATION: ECOG PERFORMANCE STATUS: 1 - Symptomatic but completely ambulatory  Vitals:   07/25/23 1145  BP: ROLLEN)  127/100  Pulse: (!) 103  Resp: 16  Temp: 98.4 F (36.9 C)  SpO2: 100%   Filed Weights   07/25/23 1145  Weight: 109 lb 14.4 oz (49.9 kg)    GENERAL:alert, no distress and comfortable SKIN: skin color, texture, turgor are normal, no rashes or significant lesions EYES: normal, conjunctiva are pink and non-injected, sclera clear OROPHARYNX:no exudate, no erythema and lips, buccal mucosa, and tongue normal  NECK: supple, thyroid  normal size, non-tender, without nodularity LYMPH:  no palpable lymphadenopathy in the cervical, axillary or inguinal LUNGS: clear to auscultation and percussion with normal breathing effort HEART: regular rate & rhythm and no murmurs and no lower extremity edema ABDOMEN:abdomen soft, non-tender and normal bowel sounds Musculoskeletal:no cyanosis of digits and no clubbing  PSYCH: alert & oriented x 3 with fluent speech NEURO: no focal motor/sensory deficits  Physical Exam   MEASUREMENTS: WT- 109 ABDOMEN: Incision healing well, no tenderness on palpation.      LABORATORY DATA:  I have reviewed the data as listed    Latest Ref Rng & Units 07/25/2023   12:35 PM 07/18/2023    2:44 PM 07/02/2023    5:08 AM  CBC  WBC  4.0 - 10.5 K/uL 9.3  9.1  19.8   Hemoglobin 12.0 - 15.0 g/dL 88.8  89.0  88.9   Hematocrit 36.0 - 46.0 % 32.7  32.6  32.9   Platelets 150 - 400 K/uL 398  606.0  438     @cmpl @  RADIOGRAPHIC STUDIES: I have personally reviewed the radiological images as listed and agreed with the findings in the report. DG Abd 1 View Result Date: 07/22/2023 CLINICAL DATA:  Abdominal pain. History of colon carcinoma with partial colectomy on 06/26/2023. EXAM: ABDOMEN - 1 VIEW COMPARISON:  06/30/2023 FINDINGS: Normal bowel gas pattern without evidence of obstruction or ileus. Surgical bowel anastomosis again visualized near the expected region of the distal transverse colon. No signs of free air. No abnormal calcifications. Visualized bony structures are unremarkable. IMPRESSION: No acute findings. Surgical bowel anastomosis near the expected region of the distal transverse colon. Electronically Signed   By: Marcey Moan M.D.   On: 07/22/2023 11:55   DG ABD ACUTE 2+V W 1V CHEST Result Date: 06/30/2023 CLINICAL DATA:  Vomiting. Per electronic medical records, recent abdominal surgery. EXAM: DG ABDOMEN ACUTE WITH 1 VIEW CHEST COMPARISON:  Radiograph 06/28/2023. Chest CT 05/14/2023. Abdominopelvic CT 05/11/2023 FINDINGS: Chronic rightward tracheal deviation due to enlarged thyroid  gland. Again seen emphysema. Increasing left basilar opacity from prior exam. Small left pleural effusion. Stable heart size and mediastinal contours. Free air under the right hemidiaphragm is similar to prior exam and not unexpected given recent surgery. Multiple enteric sutures in the central abdomen. Nonspecific air-filled loops of bowel in the right abdomen with air-fluid levels. Suspected prominent loop of colon centrally. No visible radiopaque calculi. IMPRESSION: 1. Increasing left basilar opacity from prior exam, may represent atelectasis or pneumonia. Small left pleural effusion. 2. Free air under the right hemidiaphragm is similar to  prior exam and not unexpected given recent surgery. 3. Nonspecific air-filled loops of bowel in the right abdomen with air-fluid levels. Suspected prominent loop of colon centrally. Findings may represent postoperative ileus. Electronically Signed   By: Andrea Gasman M.D.   On: 06/30/2023 18:28   DG Chest 1 View Result Date: 06/28/2023 CLINICAL DATA:  Fever EXAM: CHEST  1 VIEW COMPARISON:  05/08/2023 FINDINGS: Interval development of a left-sided volume loss with elevation of the left hemidiaphragm  and left basilar atelectasis. Previously noted right basilar pulmonary infiltrate has resolved. Right lung is clear. No pneumothorax. No pleural effusion. Cardiac size within normal limits. Pulmonary vascularity is normal. No acute bone abnormality. Deviation of the trachea to the right is unchanged, related to a thyroid  goiter. IMPRESSION: 1. Interval development of left-sided volume loss with elevation of the left hemidiaphragm and left basilar atelectasis. 2. Interval resolution of right basilar pulmonary infiltrate. Electronically Signed   By: Dorethia Molt M.D.   On: 06/28/2023 22:50    ASSESSMENT & PLAN:  [age] year old female   Metastatic left side Colon Cancer, [id number] with mesentery node and probable liver metastasis, MMR proficient Stage IV adenocarcinoma of the colon with metastasis to the mesentery and a suspicious 0.8 cm lesion in the left lobe of the liver. Post-surgery (June 26, 2023), experiencing abdominal pain, poor appetite, and weight loss. Chemotherapy recommended to shrink the tumor and control the disease. Discussed that chemotherapy typically does not cure metastatic colon cancer, with a cure rate of less than 10% with chemo alone. Most people live 1.5 to 2 years with metastatic colon cancer.   -Given the limited metastasis, she may be a candidate for local therapy after chemo.  Potential future treatments include radiation or ablation if chemotherapy is effective.  Chemotherapy regimen includes 5FU pump infusion and oxaliplatin  every two weeks, with potential side effects including fatigue, appetite changes, taste changes, and cold sensitivity. - Order liver biopsy to confirm metastasis - Refer to dietician for nutritional support - Refer to child psychotherapist for financial and social support - Schedule chemotherapy with 5FU pump infusion and oxaliplatin  every two weeks - Schedule chemo class - Order port placement for chemotherapy administration - Prescribe nausea medications and lidocaine  cream for port site - Order anemia workup including iron studies and B12 levels - Order IV iron infusion if indicated - Schedule follow-up for chemotherapy and lab work every two weeks  Post-Surgical Pain Persistent abdominal pain post-surgery, likely related to both the surgical site and the mesenteric mass. - Continue Percocet for pain management as prescribed by Dr. Debby - Monitor pain levels and adjust pain management as needed  Hypertension Hypertension, at risk of fluctuation due to poor nutritional intake. - Monitor blood pressure regularly - Ensure adequate nutritional intake to stabilize blood pressure  General Health Maintenance Former smoker, no alcohol use. Lives with a friend for support during treatment. - Encourage high-protein, high-calorie diet - Encourage physical activity as tolerated - Provide social support through family and child psychotherapist  Plan -I reviewed her cancer diagnosis, staging, and treatment options. -I recommend first-line chemotherapy with FOLFOX, starting in about 2 weeks - Schedule follow-up appointments for chemotherapy every two weeks - Schedule follow-up with dietician and social worker -port placement by Dr. Debby -IR liver biopsy  -f/u with first cycle chemo      Orders Placed This Encounter  Procedures   Consent Attestation for Oncology Treatment    The patient is informed of risks, benefits, side-effects of  the prescribed oncology treatment. Potential short term and long term side effects and response rates discussed. After a long discussion, the patient made informed decision to proceed.:   Yes   US  BIOPSY (LIVER)    Standing Status:   Future    Expected Date:   08/01/2023    Expiration Date:   07/24/2024    Lab orders requested (DO NOT place separate lab orders, these will be automatically ordered during procedure specimen collection)::   Surgical  Pathology    Reason for Exam (SYMPTOM  OR DIAGNOSIS REQUIRED):   confirm liver metastasis    Preferred location?:   [facility]   CBC with Differential (Cancer Center Only)    Standing Status:   Future    Expected Date:   08/06/2023    Expiration Date:   08/05/2024   CMP (Cancer Center only)    Standing Status:   Future    Expected Date:   08/06/2023    Expiration Date:   08/05/2024   Total Protein, Urine dipstick    Standing Status:   Future    Expected Date:   08/06/2023    Expiration Date:   08/05/2024   CBC with Differential (Cancer Center Only)    Standing Status:   Future    Expected Date:   08/20/2023    Expiration Date:   08/19/2024   CMP (Cancer Center only)    Standing Status:   Future    Expected Date:   08/20/2023    Expiration Date:   08/19/2024   CBC with Differential (Cancer Center Only)    Standing Status:   Future    Expected Date:   09/03/2023    Expiration Date:   09/02/2024   CMP (Cancer Center only)    Standing Status:   Future    Expected Date:   09/03/2023    Expiration Date:   09/02/2024   Total Protein, Urine dipstick    Standing Status:   Future    Expected Date:   09/03/2023    Expiration Date:   09/02/2024   CBC with Differential (Cancer Center Only)    Standing Status:   Future    Expected Date:   09/17/2023    Expiration Date:   09/16/2024   CMP (Cancer Center only)    Standing Status:   Future    Expected Date:   09/17/2023    Expiration Date:   09/16/2024   CEA (IN HOUSE-[facility])    Standing Status:   Standing     Number of Occurrences:   20    Expiration Date:   07/24/2024   CBC with Differential/Platelet    Standing Status:   Standing    Number of Occurrences:   50    Expiration Date:   07/24/2024   Comprehensive metabolic panel    Standing Status:   Standing    Number of Occurrences:   50    Expiration Date:   07/24/2024   Ferritin    Standing Status:   Standing    Number of Occurrences:   20    Expiration Date:   07/24/2024   Vitamin B12    Standing Status:   Future    Number of Occurrences:   1    Expiration Date:   07/24/2024   Folate, Serum    Standing Status:   Future    Number of Occurrences:   1    Expiration Date:   07/24/2024   Ambulatory Referral to [facility] Nutrition    Referral Priority:   Routine    Referral Type:   Consultation    Referral Reason:   Specialty Services Required    Number of Visits Requested:   1   PHYSICIAN COMMUNICATION ORDER    UA Protein, Dipstick required prior to every 3rd cycle bevacizumab .   ONCBCN PHYSICIAN COMMUNICATION 1    0 Number of doses of oxaliplatin  received at [facility] or outside facility. signature of Provider. If patient  has received greater than 5 doses of oxaliplatin , the following pre-medications should be ordered: dexamethasone , diphenhydramine , and formulary histamine H2 antagonist. If patient cannot tolerate oral histamine H2 antagonist, IV may be given.    All questions were answered. The patient knows to call the clinic with any problems, questions or concerns. I spent 50 minutes counseling the patient face to face. The total time spent in the appointment was 60 minutes and more than 50% was on counseling.     Onita Mattock, MD 07/25/2023 1:49 PM

## 2023-07-25 NOTE — Assessment & Plan Note (Signed)
-  [id number] with mesentery node and possible liver metastasis, G2, MMR proficient -Patient initially resented with abdominal pain to the emergency room, underwent a colonoscopy in October 2024 which showed a large mass in the splenic lecture of left side colon. -She underwent left hemicolectomy on June 26, 2023.  Large mesenteric mass was also biopsied which confirmed metastatic adenocarcinoma. -Staging CT scan and a liver MRI showed a suspicious 0.8 cm mass in the left lobe, concerning for metastasis.  No other evidence of distant metastatic disease.  I recommend ultrasound-guided left lobe liver mass biopsy. -I discussed her metastatic disease and the probable incurable nature of her disease. -I recommend systemic treatment with chemotherapy FOLFOX. -Will order NGS [employer] on her surgical sample, to see if she is a candidate for targeted therapy. -Her tumor was tested of for MMR which were normal, she is not a candidate for immunotherapy.

## 2023-07-26 ENCOUNTER — Other Ambulatory Visit: Payer: Self-pay

## 2023-07-28 ENCOUNTER — Ambulatory Visit: Payer: Self-pay | Admitting: Surgery

## 2023-07-28 ENCOUNTER — Other Ambulatory Visit: Payer: Self-pay

## 2023-07-28 DIAGNOSIS — C189 Malignant neoplasm of colon, unspecified: Secondary | ICD-10-CM

## 2023-07-29 NOTE — Progress Notes (Addendum)
 COVID Vaccine Completed:  Yes  Date of COVID positive in last 90 days:  No  PCP - Madelyn Schick, NP Cardiologist - N/A  Chest x-ray - 06-28-23 Epic EKG - 03-31-23 Epic Stress Test -  N/A ECHO -  N/A Cardiac Cath -  N/A Pacemaker/ICD device last checked: Spinal Cord Stimulator: N/A  Bowel Prep -  N/A  Sleep Study -  N/A CPAP -   Fasting Blood Sugar -  N/A Checks Blood Sugar _____ times a day  Last dose of GLP1 agonist-  N/A GLP1 instructions:  Hold 7 days before surgery    Last dose of SGLT-2 inhibitors-  N/A SGLT-2 instructions:  Hold 3 days before surgery    Blood Thinner Instructions:  N/A Aspirin Instructions: Last Dose:  Activity level:  Can go up a flight of stairs and perform activities of daily living without stopping and without symptoms of chest pain or shortness of breath.  Anesthesia review:  Hx of ILD  Patient denies shortness of breath, fever, cough and chest pain at PAT appointment (completed over the phone)  Patient verbalized understanding of instructions that were given to them at the PAT appointment. Patient was also instructed that they will need to review over the PAT instructions again at home before surgery.

## 2023-07-29 NOTE — Patient Instructions (Addendum)
 SURGICAL WAITING ROOM VISITATION Patients having surgery or a procedure may have no more than 2 support people in the waiting area - these visitors may rotate.    Children under the age of 24 must have an adult with them who is not the patient.  Due to an increase in RSV and influenza rates and associated hospitalizations, children ages 82 and under may not visit patients in [facility] hospitals.   If the patient needs to stay at the hospital during part of their recovery, the visitor guidelines for inpatient rooms apply. Pre-op nurse will coordinate an appropriate time for 1 support person to accompany patient in pre-op.  This support person may not rotate.    Please refer to the [facility] website for the visitor guidelines for Inpatients (after your surgery is over and you are in a regular room).       Your procedure is scheduled on: 07-31-23   Report to [facility] Main Entrance    Report to admitting at 11:45 AM   Call this number if you have problems the morning of surgery [phone]   Do not eat food :After Midnight.   After Midnight you may have the following liquids until 10:50 AM DAY OF SURGERY  Water  Non-Citrus Juices (without pulp, NO RED-Apple, White grape, White cranberry) Black Coffee (NO MILK/CREAM OR CREAMERS, sugar ok)  Clear Tea (NO MILK/CREAM OR CREAMERS, sugar ok) regular and decaf                             Plain Jell-O (NO RED)                                           Fruit ices (not with fruit pulp, NO RED)                                     Popsicles (NO RED)                                                               Sports drinks like Gatorade (NO RED)                       If you have questions, please contact your surgeon's office.   FOLLOW  ANY ADDITIONAL PRE OP INSTRUCTIONS YOU RECEIVED FROM YOUR SURGEON'S OFFICE!!!     Oral Hygiene is also important to reduce your risk of infection.                                    Remember  - BRUSH YOUR TEETH THE MORNING OF SURGERY WITH YOUR REGULAR TOOTHPASTE   Do NOT smoke after Midnight   Take these medicines the morning of surgery with A SIP OF WATER :   None  Stop all vitamins and herbal supplements 7 days before surgery  You may not have any metal on your body including hair pins, jewelry, and body piercing             Do not wear make-up, lotions, powders, perfumes or deodorant  Do not wear nail polish including gel and S&S, artificial/acrylic nails, or any other type of covering on natural nails including finger and toenails. If you have artificial nails, gel coating, etc. that needs to be removed by a nail salon please have this removed prior to surgery or surgery may need to be canceled/ delayed if the surgeon/ anesthesia feels like they are unable to be safely monitored.   Do not shave  48 hours prior to surgery.    Do not bring valuables to the hospital. [facility] IS NOT RESPONSIBLE   FOR VALUABLES.   Contacts, dentures or bridgework may not be worn into surgery.   DO NOT BRING YOUR HOME MEDICATIONS TO THE HOSPITAL. PHARMACY WILL DISPENSE MEDICATIONS LISTED ON YOUR MEDICATION LIST TO YOU DURING YOUR ADMISSION IN THE HOSPITAL!    Patients discharged on the day of surgery will not be allowed to drive home.  Someone NEEDS to stay with you for the first 24 hours after anesthesia.   Special Instructions: Bring a copy of your healthcare power of attorney and living will documents the day of surgery if you haven't scanned them before.              Please read over the following fact sheets you were given: IF YOU HAVE QUESTIONS ABOUT YOUR PRE-OP INSTRUCTIONS PLEASE CALL [phone] [facility]  If you received a COVID test during your pre-op visit  it is requested that you wear a mask when out in public, stay away from anyone that may not be feeling well and notify your surgeon if you develop symptoms. If you test positive for Covid or have been  in contact with anyone that has tested positive in the last 10 days please notify you surgeon.  [facility] - Preparing for Surgery Before surgery, you can play an important role.  Because skin is not sterile, your skin needs to be as free of germs as possible.  You can reduce the number of germs on your skin by washing with CHG (chlorahexidine gluconate) soap before surgery.  CHG is an antiseptic cleaner which kills germs and bonds with the skin to continue killing germs even after washing. Please DO NOT use if you have an allergy to CHG or antibacterial soaps.  If your skin becomes reddened/irritated stop using the CHG and inform your nurse when you arrive at Short Stay. Do not shave (including legs and underarms) for at least 48 hours prior to the first CHG shower.  You may shave your face/neck.  Please follow these instructions carefully:  1.  Shower with CHG Soap the night before surgery and the  morning of surgery.  2.  If you choose to wash your hair, wash your hair first as usual with your normal  shampoo.  3.  After you shampoo, rinse your hair and body thoroughly to remove the shampoo.                             4.  Use CHG as you would any other liquid soap.  You can apply chg directly to the skin and wash.  Gently with a scrungie or clean washcloth.  5.  Apply the CHG Soap to your body ONLY FROM  THE NECK DOWN.   Do   not use on face/ open                           Wound or open sores. Avoid contact with eyes, ears mouth and   genitals (private parts).                       Wash face,  Genitals (private parts) with your normal soap.             6.  Wash thoroughly, paying special attention to the area where your    surgery  will be performed.  7.  Thoroughly rinse your body with warm water  from the neck down.  8.  DO NOT shower/wash with your normal soap after using and rinsing off the CHG Soap.                9.  Pat yourself dry with a clean towel.            10.  Wear clean  pajamas.            11.  Place clean sheets on your bed the night of your first shower and do not  sleep with pets. Day of Surgery : Do not apply any lotions/deodorants the morning of surgery.  Please wear clean clothes to the hospital/surgery center.  FAILURE TO FOLLOW THESE INSTRUCTIONS MAY RESULT IN THE CANCELLATION OF YOUR SURGERY  PATIENT SIGNATURE_________________________________  NURSE SIGNATURE__________________________________  ________________________________________________________________________

## 2023-07-30 ENCOUNTER — Other Ambulatory Visit: Payer: Self-pay

## 2023-07-30 ENCOUNTER — Encounter (HOSPITAL_COMMUNITY)
Admission: RE | Admit: 2023-07-30 | Discharge: 2023-07-30 | Disposition: A | Discharge status: Home / Self Care | Payer: Self-pay | Source: Ambulatory Visit | Attending: Surgery | Admitting: Surgery

## 2023-07-30 ENCOUNTER — Telehealth: Payer: Self-pay | Admitting: Hematology

## 2023-07-30 ENCOUNTER — Encounter (HOSPITAL_COMMUNITY): Payer: Self-pay

## 2023-07-30 ENCOUNTER — Telehealth: Payer: Self-pay

## 2023-07-30 NOTE — Telephone Encounter (Signed)
 [facility] CSW Progress Note  Patient's daughter called support services wanting information on applying for various benefits. CSW provided general information to patient and contact for primary social worker.   Maudie Sorrow, LCSW Clinical Social Worker [facility]

## 2023-07-30 NOTE — Telephone Encounter (Signed)
 Patient is aware of rescheduled appointment times/dates for patient education

## 2023-07-31 ENCOUNTER — Encounter: Payer: Self-pay | Admitting: Hematology

## 2023-07-31 ENCOUNTER — Telehealth (INDEPENDENT_AMBULATORY_CARE_PROVIDER_SITE_OTHER): Payer: Self-pay | Admitting: Primary Care

## 2023-07-31 ENCOUNTER — Ambulatory Visit (HOSPITAL_COMMUNITY): Payer: Medicaid Other | Admitting: Certified Registered"

## 2023-07-31 ENCOUNTER — Ambulatory Visit (HOSPITAL_COMMUNITY): Payer: Medicaid Other

## 2023-07-31 ENCOUNTER — Ambulatory Visit (HOSPITAL_BASED_OUTPATIENT_CLINIC_OR_DEPARTMENT_OTHER): Payer: Medicaid Other | Admitting: Certified Registered"

## 2023-07-31 ENCOUNTER — Other Ambulatory Visit: Payer: Self-pay

## 2023-07-31 ENCOUNTER — Encounter (HOSPITAL_COMMUNITY): Payer: Self-pay | Admitting: Surgery

## 2023-07-31 ENCOUNTER — Encounter (HOSPITAL_COMMUNITY)
Admission: RE | Disposition: A | Discharge status: Home / Self Care | Payer: Self-pay | Source: Home / Self Care | Attending: Surgery

## 2023-07-31 ENCOUNTER — Ambulatory Visit (HOSPITAL_COMMUNITY)
Admission: RE | Admit: 2023-07-31 | Discharge: 2023-07-31 | Disposition: A | Discharge status: Home / Self Care | Payer: Medicaid Other | Attending: Surgery | Admitting: Surgery

## 2023-07-31 DIAGNOSIS — Z87891 Personal history of nicotine dependence: Secondary | ICD-10-CM | POA: Insufficient documentation

## 2023-07-31 DIAGNOSIS — I10 Essential (primary) hypertension: Secondary | ICD-10-CM | POA: Insufficient documentation

## 2023-07-31 DIAGNOSIS — J439 Emphysema, unspecified: Secondary | ICD-10-CM | POA: Insufficient documentation

## 2023-07-31 DIAGNOSIS — C189 Malignant neoplasm of colon, unspecified: Secondary | ICD-10-CM | POA: Diagnosis present

## 2023-07-31 DIAGNOSIS — G935 Compression of brain: Secondary | ICD-10-CM | POA: Insufficient documentation

## 2023-07-31 DIAGNOSIS — J45909 Unspecified asthma, uncomplicated: Secondary | ICD-10-CM | POA: Diagnosis not present

## 2023-07-31 HISTORY — PX: PORTACATH PLACEMENT: SHX2246

## 2023-07-31 SURGERY — INSERTION, TUNNELED CENTRAL VENOUS DEVICE, WITH PORT
Anesthesia: General

## 2023-07-31 MED ORDER — CHLORHEXIDINE GLUCONATE CLOTH 2 % EX PADS: 6.0000 | MEDICATED_PAD | Freq: Once | CUTANEOUS | Status: DC

## 2023-07-31 MED ORDER — LACTATED RINGERS IV SOLN
INTRAVENOUS | Status: DC
Start: 2023-07-31 — End: 2023-07-31

## 2023-07-31 MED ORDER — CEFAZOLIN SODIUM-DEXTROSE 2-4 GM/100ML-% IV SOLN
2.0000 g | INTRAVENOUS | Status: AC
  Administered 2023-07-31: 2 g via INTRAVENOUS
  Filled 2023-07-31: qty 100

## 2023-07-31 MED ORDER — HEPARIN SOD (PORK) LOCK FLUSH 100 UNIT/ML IV SOLN
INTRAVENOUS | Status: AC
  Filled 2023-07-31: qty 5

## 2023-07-31 MED ORDER — GABAPENTIN 300 MG PO CAPS
300.0000 mg | ORAL_CAPSULE | ORAL | Status: AC
  Administered 2023-07-31: 300 mg via ORAL
  Filled 2023-07-31: qty 1

## 2023-07-31 MED ORDER — FENTANYL CITRATE PF 50 MCG/ML IJ SOSY
PREFILLED_SYRINGE | INTRAMUSCULAR | Status: AC
  Filled 2023-07-31: qty 1

## 2023-07-31 MED ORDER — BUPIVACAINE-EPINEPHRINE 0.25% -1:200000 IJ SOLN
INTRAMUSCULAR | Status: AC
  Filled 2023-07-31: qty 1

## 2023-07-31 MED ORDER — CHLORHEXIDINE GLUCONATE 0.12 % MT SOLN: 15.0000 mL | Freq: Once | OROMUCOSAL | Status: DC

## 2023-07-31 MED ORDER — FENTANYL CITRATE (PF) 100 MCG/2ML IJ SOLN
INTRAMUSCULAR | Status: AC
  Filled 2023-07-31: qty 2

## 2023-07-31 MED ORDER — ORAL CARE MOUTH RINSE: 15.0000 mL | Freq: Once | OROMUCOSAL | Status: DC

## 2023-07-31 MED ORDER — OXYCODONE HCL 5 MG PO TABS: 5.0000 mg | ORAL_TABLET | Freq: Once | ORAL | Status: DC | PRN

## 2023-07-31 MED ORDER — ORAL CARE MOUTH RINSE: 15.0000 mL | Freq: Once | OROMUCOSAL | Status: AC

## 2023-07-31 MED ORDER — ONDANSETRON HCL 4 MG/2ML IJ SOLN
INTRAMUSCULAR | Status: AC
Start: 2023-07-31 — End: ?
  Filled 2023-07-31: qty 2

## 2023-07-31 MED ORDER — ONDANSETRON HCL 4 MG/2ML IJ SOLN: 4.0000 mg | Freq: Four times a day (QID) | INTRAMUSCULAR | Status: DC | PRN

## 2023-07-31 MED ORDER — FENTANYL CITRATE PF 50 MCG/ML IJ SOSY
25.0000 ug | PREFILLED_SYRINGE | INTRAMUSCULAR | Status: DC | PRN
  Administered 2023-07-31 (×2): 25 ug via INTRAVENOUS

## 2023-07-31 MED ORDER — HEPARIN 6000 UNIT IRRIGATION SOLUTION
Freq: Once | Status: AC
  Administered 2023-07-31: 1
  Filled 2023-07-31: qty 6000

## 2023-07-31 MED ORDER — LIDOCAINE HCL (CARDIAC) PF 100 MG/5ML IV SOSY
PREFILLED_SYRINGE | INTRAVENOUS | Status: DC | PRN
  Administered 2023-07-31: 80 mg via INTRATRACHEAL

## 2023-07-31 MED ORDER — BISACODYL 5 MG PO TBEC: 20.0000 mg | DELAYED_RELEASE_TABLET | Freq: Once | ORAL | Status: DC

## 2023-07-31 MED ORDER — PROPOFOL 10 MG/ML IV BOLUS
INTRAVENOUS | Status: DC | PRN
  Administered 2023-07-31: 120 mg via INTRAVENOUS

## 2023-07-31 MED ORDER — MIDAZOLAM HCL 2 MG/2ML IJ SOLN
INTRAMUSCULAR | Status: AC
  Filled 2023-07-31: qty 2

## 2023-07-31 MED ORDER — 0.9 % SODIUM CHLORIDE (POUR BTL) OPTIME
TOPICAL | Status: DC | PRN
  Administered 2023-07-31: 1000 mL

## 2023-07-31 MED ORDER — POLYETHYLENE GLYCOL 3350 17 GM/SCOOP PO POWD: 1.0000 | Freq: Once | ORAL | Status: DC

## 2023-07-31 MED ORDER — ROCURONIUM BROMIDE 10 MG/ML (PF) SYRINGE
PREFILLED_SYRINGE | INTRAVENOUS | Status: AC
  Filled 2023-07-31: qty 20

## 2023-07-31 MED ORDER — OXYCODONE HCL 5 MG/5ML PO SOLN: 5.0000 mg | Freq: Once | ORAL | Status: DC | PRN

## 2023-07-31 MED ORDER — ACETAMINOPHEN 500 MG PO TABS
1000.0000 mg | ORAL_TABLET | ORAL | Status: AC
  Administered 2023-07-31: 1000 mg via ORAL
  Filled 2023-07-31: qty 2

## 2023-07-31 MED ORDER — FENTANYL CITRATE (PF) 100 MCG/2ML IJ SOLN
INTRAMUSCULAR | Status: DC | PRN
  Administered 2023-07-31: 50 ug via INTRAVENOUS

## 2023-07-31 MED ORDER — PHENYLEPHRINE 80 MCG/ML (10ML) SYRINGE FOR IV PUSH (FOR BLOOD PRESSURE SUPPORT)
PREFILLED_SYRINGE | INTRAVENOUS | Status: AC
  Filled 2023-07-31: qty 10

## 2023-07-31 MED ORDER — PHENYLEPHRINE HCL (PRESSORS) 10 MG/ML IV SOLN
INTRAVENOUS | Status: DC | PRN
  Administered 2023-07-31 (×2): 80 ug via INTRAVENOUS

## 2023-07-31 MED ORDER — BUPIVACAINE-EPINEPHRINE 0.25% -1:200000 IJ SOLN
INTRAMUSCULAR | Status: DC | PRN
  Administered 2023-07-31: 20 mL

## 2023-07-31 MED ORDER — ONDANSETRON HCL 4 MG/2ML IJ SOLN
INTRAMUSCULAR | Status: DC | PRN
  Administered 2023-07-31: 4 mg via INTRAVENOUS

## 2023-07-31 MED ORDER — DEXAMETHASONE SODIUM PHOSPHATE 10 MG/ML IJ SOLN
INTRAMUSCULAR | Status: DC | PRN
  Administered 2023-07-31: 5 mg via INTRAVENOUS

## 2023-07-31 MED ORDER — DEXAMETHASONE SODIUM PHOSPHATE 10 MG/ML IJ SOLN
INTRAMUSCULAR | Status: AC
Start: 2023-07-31 — End: ?
  Filled 2023-07-31: qty 1

## 2023-07-31 MED ORDER — CHLORHEXIDINE GLUCONATE 0.12 % MT SOLN
15.0000 mL | Freq: Once | OROMUCOSAL | Status: AC
  Administered 2023-07-31: 15 mL via OROMUCOSAL

## 2023-07-31 MED ORDER — ONDANSETRON HCL 4 MG/2ML IJ SOLN
INTRAMUSCULAR | Status: AC
Start: 2023-07-31 — End: ?
  Filled 2023-07-31: qty 4

## 2023-07-31 MED ORDER — HEPARIN SOD (PORK) LOCK FLUSH 100 UNIT/ML IV SOLN
INTRAVENOUS | Status: DC | PRN
  Administered 2023-07-31: 500 [IU]

## 2023-07-31 MED ORDER — DEXAMETHASONE SODIUM PHOSPHATE 10 MG/ML IJ SOLN
INTRAMUSCULAR | Status: AC
Start: 2023-07-31 — End: ?
  Filled 2023-07-31: qty 2

## 2023-07-31 MED ORDER — LACTATED RINGERS IV SOLN: INTRAVENOUS | Status: DC | PRN

## 2023-07-31 SURGICAL SUPPLY — 31 items
BAG COUNTER SPONGE SURGICOUNT (BAG) IMPLANT
BAG DECANTER FOR FLEXI CONT (MISCELLANEOUS) ×1 IMPLANT
BLADE SURG SZ11 CARB STEEL (BLADE) ×1 IMPLANT
CHLORAPREP W/TINT 26 (MISCELLANEOUS) ×1 IMPLANT
COVER PROBE U/S 5X48 (MISCELLANEOUS) ×1 IMPLANT
COVER SURGICAL LIGHT HANDLE (MISCELLANEOUS) ×1 IMPLANT
DRAPE C-ARM 42X120 X-RAY (DRAPES) ×1 IMPLANT
DRAPE LAPAROTOMY T 98X78 PEDS (DRAPES) ×1 IMPLANT
DRSG IV TEGADERM 3.5X4.5 STRL (GAUZE/BANDAGES/DRESSINGS) IMPLANT
DRSG TEGADERM 2-3/8X2-3/4 SM (GAUZE/BANDAGES/DRESSINGS) IMPLANT
DRSG TEGADERM 4X4.75 (GAUZE/BANDAGES/DRESSINGS) IMPLANT
ELECT REM PT RETURN 15FT ADLT (MISCELLANEOUS) ×1 IMPLANT
GAUZE 4X4 16PLY ~~LOC~~+RFID DBL (SPONGE) ×1 IMPLANT
GAUZE SPONGE 2X2 8PLY STRL LF (GAUZE/BANDAGES/DRESSINGS) ×1 IMPLANT
GLOVE ECLIPSE 8.0 STRL XLNG CF (GLOVE) ×1 IMPLANT
GLOVE INDICATOR 8.0 STRL GRN (GLOVE) ×1 IMPLANT
GOWN STRL REUS W/ TWL XL LVL3 (GOWN DISPOSABLE) ×3 IMPLANT
KIT BASIN OR (CUSTOM PROCEDURE TRAY) ×1 IMPLANT
KIT PORT POWER 8FR ISP CVUE (Port) IMPLANT
KIT TURNOVER KIT A (KITS) IMPLANT
NDL HYPO 22X1.5 SAFETY MO (MISCELLANEOUS) ×1 IMPLANT
NEEDLE HYPO 22X1.5 SAFETY MO (MISCELLANEOUS) ×1
PACK BASIC VI WITH GOWN DISP (CUSTOM PROCEDURE TRAY) ×1 IMPLANT
PENCIL SMOKE EVACUATOR (MISCELLANEOUS) IMPLANT
SET PORT ACCESS POWERLOC (SET/KITS/TRAYS/PACK) IMPLANT
SPIKE FLUID TRANSFER (MISCELLANEOUS) ×1 IMPLANT
SUT MNCRL AB 4-0 PS2 18 (SUTURE) ×1 IMPLANT
SUT PROLENE 2 0 SH DA (SUTURE) ×2 IMPLANT
SYR 10ML LL (SYRINGE) ×1 IMPLANT
SYR 20ML LL LF (SYRINGE) ×1 IMPLANT
TOWEL OR 17X26 10 PK STRL BLUE (TOWEL DISPOSABLE) ×1 IMPLANT

## 2023-07-31 NOTE — Anesthesia Preprocedure Evaluation (Signed)
Anesthesia Evaluation  Patient identified by MRN, date of birth, ID band Patient awake    Reviewed: Allergy & Precautions, H&P , NPO status , Patient's Chart, lab work & pertinent test results  Airway Mallampati: II   Neck ROM: full    Dental   Pulmonary asthma , former smoker Interstitial lung dz   breath sounds clear to auscultation       Cardiovascular hypertension,  Rhythm:regular Rate:Normal     Neuro/Psych Chiari malformation    GI/Hepatic Colon CA   Endo/Other   Hyperthyroidism   Renal/GU      Musculoskeletal   Abdominal   Peds  Hematology   Anesthesia Other Findings   Reproductive/Obstetrics                             Anesthesia Physical Anesthesia Plan  ASA: 3  Anesthesia Plan: General   Post-op Pain Management:    Induction: Intravenous  PONV Risk Score and Plan: 3 and Ondansetron, Dexamethasone, Treatment may vary due to age or medical condition and Midazolam  Airway Management Planned: LMA  Additional Equipment:   Intra-op Plan:   Post-operative Plan: Extubation in OR  Informed Consent: I have reviewed the patients History and Physical, chart, labs and discussed the procedure including the risks, benefits and alternatives for the proposed anesthesia with the patient or authorized representative who has indicated his/her understanding and acceptance.     Dental advisory given  Plan Discussed with: CRNA, Anesthesiologist and Surgeon  Anesthesia Plan Comments:        Anesthesia Quick Evaluation

## 2023-07-31 NOTE — Op Note (Signed)
07/31/2023  3:17 PM  PATIENT:  Carol Mack  53 y.o. female  Patient Care Team: [name], NP as PCP - General (Internal Medicine) [name], MD as Consulting Physician (Hematology and Oncology) [name], MD as Consulting Physician (General Surgery) [name], MD as Consulting Physician (Gastroenterology)  PRE-OPERATIVE DIAGNOSIS:  COLON CANCER  POST-OPERATIVE DIAGNOSIS:  COLON CANCER  PROCEDURE:  Procedure(s): PLACEMENT OF PORT A CATHETER UNDER ULTRASOUND AND FLUOROSCOPIC GUIDANCE  SURGEON:  Ardeth Sportsman, MD  ASSISTANT: none   ANESTHESIA:   local and MAC w LMA  EBL:  Total I/O In: 700 [I.V.:600; IV Piggyback:100] Out: -   Delay start of Pharmacological VTE agent (>24hrs) due to surgical blood loss or risk of bleeding:  no  DRAINS: none   SPECIMEN:  No Specimen  DISPOSITION OF SPECIMEN:  N/A  COUNTS:  YES  PLAN OF CARE: Discharge to home after PACU  PATIENT DISPOSITION:  PACU - hemodynamically stable.  INDICATION: Patient with metastatic colon cancer and need for IV therapy.  Port-A-Cath placement was requested.  Use of a central venous catheter for intravenous therapy was discussed.  Technique of catheter placement using ultrasound and fluoroscopy guidance was discussed.  Risks such as bleeding, infection, pneumothorax, catheter occlusion, reoperation, and other risks were discussed.   I noted a good likelihood this will help address the problem.  Questions were answered.  The patient expressed understanding & wishes to proceed.  OR FINDINGS: Normal-appearing anatomy.  Is an 8 [place] power port. It goes through the right internal jugular vein.  ERAS at the SVC/RA junction  DESCRIPTION:   Procedure: Informed consent was confirmed. Patient was brought the operating room. and positioned supine. Arms were tucked. The patient underwent deep sedation. Neck and chest were clipped and prepped and draped in a sterile fashion. A surgical  timeout confirmed our plan.  I placed a field block of local anesthesia on the neck & chest  I used the ultrasound to identify the right internal jugular vein. I entered into it using on the first venipuncture under direct ultrasound guidance. Wire was passed into the inferior vena cava by fluoroscopy.  I made an incision in the lateral infraclavicular pocket. Made a subcutaneous pocket. I tunneled the power port from the chest wound to the neck puncture site. The port was secured to the left anterior chest wall using 2-0 Prolene interrupted stitches x4. Catheter flushed well.  I used a dilator on the wire using Seldinger technique to dilate the neck tract. Then I used a dilator with a peel away sheath.  We used fluoroscopy.  I pulled the wire back into the right atrial/superior vena cava junction.  I pulled the wire back until it was at the neck puncture site. This gave the true measurement of the intravenous segment. I cut the catheter to appropriate length. I removed the wire and dilator. The catheter was placed within the sheath. The sheath was peeled away.  Fluoroscopy confirmed the tip in the right atrium at first but was able to adjust the catheter tip.  With that repositioning, the catheter aspirated and flushed well. On final fluoro reevaluation the tip seen to be in good position in the distal SVC.    I closed the wounds using 4 Monocryl stitch & placed sterile dressings. Patient should go home later today. Catheter is okay to use.  I discussed operative findings, updated the patient's status, discussed probable steps to recovery, and gave postoperative recommendations to the patient's sister,  Joni Reining .  Recommendations were made.  Questions were answered.  She expressed understanding & appreciation.   Ardeth Sportsman, M.D., F.A.C.S. Gastrointestinal and Minimally Invasive Surgery [facility] [address], [address] [place], [place] [address] [phone] Main /  Paging

## 2023-07-31 NOTE — Progress Notes (Addendum)
Pharmacist Chemotherapy Monitoring - Initial Assessment    Anticipated start date: 08/06/23   The following has been reviewed per standard work regarding the patient's treatment regimen: The patient's diagnosis, treatment plan and drug doses, and organ/hematologic function Lab orders and baseline tests specific to treatment regimen  The treatment plan start date, drug sequencing, and pre-medications Prior authorization status  Patient's documented medication list, including drug-drug interaction screen and prescriptions for anti-emetics and supportive care specific to the treatment regimen The drug concentrations, fluid compatibility, administration routes, and timing of the medications to be used The patient's access for treatment and lifetime cumulative dose history, if applicable  The patient's medication allergies and previous infusion related reactions, if applicable   Changes made to treatment plan:  Mvasi deleted from C1D1 per Dr. Mosetta Putt, as patient is getting port placed on 07/31/23.  Follow up needed:  N/A  Drusilla Kanner, PharmD, MBA

## 2023-07-31 NOTE — Transfer of Care (Signed)
Immediate Anesthesia Transfer of Care Note  Patient: Carol Mack  Procedure(s) Performed: PLACEMENT OF PORT A CATHETER UNDER ULTRASOUND AND FLUOROSCOPIC GUIDANCE  Patient Location: PACU  Anesthesia Type:General  Level of Consciousness: awake and alert   Airway & Oxygen Therapy: Patient Spontanous Breathing and Patient connected to face mask oxygen  Post-op Assessment: Report given to RN and Post -op Vital signs reviewed and stable  Post vital signs: Reviewed and stable  Last Vitals:  Vitals Value Taken Time  BP 114/84 07/31/23 1522  Temp    Pulse 87 07/31/23 1525  Resp 20 07/31/23 1525  SpO2 100 % 07/31/23 1525  Vitals shown include unfiled device data.  Last Pain:  Vitals:   07/31/23 1226  TempSrc:   PainSc: 0-No pain         Complications: No notable events documented.

## 2023-07-31 NOTE — Discharge Instructions (Signed)
PORT-A-CATHETER PLACEMENT:  INSTRUCTIONS AFTER SURGERY  The Chemotherapy / Oncology nurse will remove your waterproof bandages in their office a few days after surgery.  Do not remove the bandages unless you are at least 5 days out from surgery or as otherwise instructed  DIET: Follow a light bland diet the first night after arrival home, such as soup, liquids, crackers, etc.  Be sure to include lots of fluids daily.  Avoid fast food or heavy meals as your are more likely to get nauseated.    Take your usually prescribed home medications unless otherwise directed.  PAIN CONTROL: Pain is best controlled by a usual combination of three different methods TOGETHER: Ice/Heat Over the counter acetaminophen pain medication Prescription pain medication  Most patients will experience some swelling and bruising around the incisions.  Ice packs or heating pads (30-60 minutes up to 6 times a day) will help. Use ice for the first few days to help decrease swelling and bruising, then switch to heat to help relax tight/sore spots and speed recovery.  Some people prefer to use ice alone, heat alone, alternating between ice & heat.  Experiment to what works for you.  Swelling and bruising can take several weeks to resolve.    It is helpful to take an over-the-counter pain medication regularly for the first few weeks.  Using acetaminophen (Tylenol, etc) 500-650mg four times a day (every meal & bedtime) is usually safest since NSAIDs like ibuprofen are not advisable in patients with kidney disease.  A  prescription for pain medication (such as oxycodone, hydrocodone, etc) may be given to you upon discharge.  Take your pain medication as prescribed.   If you are having problems/concerns with the prescription medicine (does not control pain, nausea, vomiting, rash, itching, etc), please call us [phone] to see if we need to switch you to a different pain medicine that will work better for you and/or control  your side effect better. If you need a refill on your pain medication, please contact your pharmacy.  They will contact our office to request authorization. Prescriptions will not be filled after 5 pm or on week-ends.  Avoid getting constipated.   Between the surgery and the pain medications, it is common to experience some constipation.  Increasing fluid intake and taking a fiber supplement (such as Metamucil, Citrucel, FiberCon, MiraLax, etc) 1-2 times a day regularly will usually help prevent this problem from occurring.  A mild laxative (prune juice, Milk of Magnesia, MiraLax, etc) should be taken according to package directions if there are no bowel movements after 48 hours.    Wash / shower every day.   You may shower over the dressings as they are waterproof.  Continue to shower over incision(s) after the dressing is off.  The Chemotherapy / Oncology nurse will remove your waterproof bandages in their office a few days after surgery.  Do not remove the bandages unless you are 5 days out from surgery  ACTIVITIES as tolerated:   You may resume regular (light) daily activities beginning the next day--such as daily self-care, walking, climbing stairs--gradually increasing activities as tolerated.  If you can walk 30 minutes without difficulty, it is safe to try more intense activity such as jogging, treadmill, bicycling, low-impact aerobics, swimming, etc. Save the most intensive and strenuous activity for last such as push-ups, heavy lifting, contact sports, etc  Refrain from any heavy lifting or straining until you are off narcotics for pain control.    DO NOT PUSH   THROUGH PAIN.  Let pain be your guide: If it hurts to do something, don't do it.  Pain is your body warning you to avoid that activity for another week until the pain goes down. You may drive when you are no longer taking prescription pain medication, you can comfortably wear a seatbelt, and you can safely maneuver your car and apply  brakes. You may have sexual intercourse when it is comfortable.   FOLLOW UP with the Oncology / Chemotherapy nurses closely after surgery. Please call [facility] at [phone] only as needed.  The infusion nurses & Oncology usually follow you closely, making the need for follow-up in our office redundant and therefore not needed.  If they or you have concerns, please call us for possible follow-up in our office 9. IF YOU HAVE DISABILITY OR FAMILY LEAVE FORMS, BRING THEM TO THE OFFICE FOR PROCESSING.  DO NOT GIVE THEM TO YOUR DOCTOR.   WHEN TO CALL US [phone]: Poor pain control Reactions / problems with new medications (rash/itching, nausea, etc)  Fever over 101.5 F (38.5 C) Worsening swelling or bruising Continued bleeding from incision. Increased pain, redness, or drainage from the incision   The clinic staff is available to answer your questions during regular business hours (8:30am-5pm).  Please don't hesitate to call and ask to speak to one of our nurses for clinical concerns.   If you have a medical emergency, go to the nearest emergency room or call 911.  A surgeon from [facility] is always on call at the hospitals   [facility] [address], [address], [place], [place]  [address] ? MAIN: [phone] ? TOLL FREE: [phone] ?  FAX [phone] [email or web address]  

## 2023-07-31 NOTE — Telephone Encounter (Signed)
Referral Request - Did the patient discuss referral with their provider in the last year? Yes (If No - schedule appointment) (If Yes - send message)  Appointment offered? Yes  Type of order/referral and detailed reason for visit: pt dx w/ cancer lastweek.  Family wants 2nd opinion.  Preference of office, provider, location: [facility]  If referral order, have you been seen by this specialty before? Yes  family wants a 2nd opinion.  Please call (If Yes, this issue or another issue? When? Where? Went to [facility] center  Can we respond through MyChart? No Please call daughter Carol Mack

## 2023-07-31 NOTE — Anesthesia Postprocedure Evaluation (Signed)
Anesthesia Post Note  Patient: Treona Ueno  Procedure(s) Performed: PLACEMENT OF PORT A CATHETER UNDER ULTRASOUND AND FLUOROSCOPIC GUIDANCE     Patient location during evaluation: PACU Anesthesia Type: General Level of consciousness: awake and alert Pain management: pain level controlled Vital Signs Assessment: post-procedure vital signs reviewed and stable Respiratory status: spontaneous breathing, nonlabored ventilation, respiratory function stable and patient connected to nasal cannula oxygen Cardiovascular status: blood pressure returned to baseline and stable Postop Assessment: no apparent nausea or vomiting Anesthetic complications: no   No notable events documented.  Last Vitals:  Vitals:   07/31/23 1630 07/31/23 1645  BP: (!) 114/91 113/85  Pulse: 87 80  Resp:    Temp:    SpO2: 93% 94%    Last Pain:  Vitals:   07/31/23 1627  TempSrc: Oral  PainSc: 4                  Dereon Williamsen S

## 2023-07-31 NOTE — Telephone Encounter (Signed)
Call placed to patient unable to reach message left on VM.   

## 2023-07-31 NOTE — Interval H&P Note (Signed)
History and Physical Interval Note:  07/31/2023 1:41 PM  Carol Mack  has presented today for surgery, with the diagnosis of COLON CANCER.  The various methods of treatment have been discussed with the patient and family. After consideration of risks, benefits and other options for treatment, the patient has consented to  Procedure(s) with comments: PLACEMENT OF PORT A CATHETER UNDER ULTRASOUND AND FLUOROSCOPIC GUIDANCE (N/A) - GEN WITH ERAS 1.5 HOURS TOTAL as a surgical intervention.  The patient's history has been reviewed, patient examined, no change in status, stable for surgery.  I have reviewed the patient's chart and labs.  Questions were answered to the patient's satisfaction.     Metastatic left side Colon Cancer, [id number] with mesentery node and probable liver metastasis, MMR proficient  Discussed at GI tumor board and felt to benefit from post adjuvant chemotherapy.  FOLFOX regimen.  Dr. Mosetta Putt with [employer] request Port-A-Cath placement.  Dr. Maisie Fus not available but I am.  Recommend discussed with the patient who agrees to proceed.  Use of a central venous catheter for intravenous therapy was discussed.  Technique of catheter placement using ultrasound and fluoroscopy guidance was discussed.  Risks such as bleeding, infection, injury to other organs, need for repair of tissues / organs, need for further treatment, pneumothorax, catheter occlusion, reoperation, and other risks were discussed.   I noted a good likelihood this will help address the problem.  Questions were answered.  The patient expressed understanding & wishes to proceed.   I have re-reviewed the the patient's records, history, medications, and allergies.  I have re-examined the patient.  I again discussed intraoperative plans and goals of post-operative recovery.  The patient agrees to proceed.  Carol Mack  1970-10-28 [id number]  Patient Care Team: [name], NP as PCP - General (Internal  Medicine)  Patient Active Problem List   Diagnosis Date Noted   Colon cancer metastasized to mesenteric lymph nodes (HCC) 07/25/2023   Colon polyp 06/26/2023   S/P laparoscopic hysterectomy 09/15/2018   Left breast lump 08/07/2018   Essential hypertension 05/28/2018   Cigarette nicotine dependence without complication 05/28/2018   Multinodular goiter 01/19/2016   Hyperthyroidism 09/25/2015    Past Medical History:  Diagnosis Date   Anemia    Asthma    Bartholin cyst    Chiari malformation type I (HCC)    Hernia, inguinal    Hernia, inguinal    Hypertension    ILD (interstitial lung disease) (HCC)     Past Surgical History:  Procedure Laterality Date   fibroid freezing     unsure   IR GENERIC HISTORICAL  07/27/2014   IR RADIOLOGIST EVAL & MGMT 07/27/2014 [name], MD [facility] INTERV RAD   ROBOTIC ASSISTED TOTAL HYSTERECTOMY N/A 09/15/2018   Procedure: XI ROBOTIC ASSISTED TOTAL HYSTERECTOMY WITH SALPINGECTOMY;  Surgeon: Willodean Rosenthal, MD;  Location: [facility];  Service: Gynecology;  Laterality: N/A;    Social History   Socioeconomic History   Marital status: Single    Spouse name: Not on file   Number of children: 2   Years of education: Not on file   Highest education level: Not on file  Occupational History   Not on file  Tobacco Use   Smoking status: Former    Current packs/day: 1.00    Average packs/day: 1 pack/day for 29.0 years (29.0 ttl pk-yrs)    Types: Cigarettes    Start date: 1996    Passive exposure: Current   Smokeless tobacco: Never  Vaping Use   Vaping status: Never Used  Substance and Sexual Activity   Alcohol use: No   Drug use: No   Sexual activity: Not on file  Other Topics Concern   Not on file  Social History Narrative   Not on file   Social Drivers of Health   Financial Resource Strain: Not on file  Food Insecurity: No Food Insecurity (06/26/2023)   Hunger Vital Sign    Worried About Running Out of Food in  the Last Year: Never true    Ran Out of Food in the Last Year: Never true  Transportation Needs: No Transportation Needs (06/26/2023)   PRAPARE - Administrator, Civil Service (Medical): No    Lack of Transportation (Non-Medical): No  Physical Activity: Not on file  Stress: Not on file  Social Connections: Not on file  Intimate Partner Violence: Patient Declined (06/26/2023)   Humiliation, Afraid, Rape, and Kick questionnaire    Fear of Current or Ex-Partner: Patient declined    Emotionally Abused: Patient declined    Physically Abused: Patient declined    Sexually Abused: Patient declined    Family History  Problem Relation Age of Onset   Thyroid disease Mother        large benign goiter   Cancer Maternal Uncle        UNKNOWN TYPE CANCER   Colon cancer Neg Hx    Esophageal cancer Neg Hx    Rectal cancer Neg Hx    Stomach cancer Neg Hx     Medications Prior to Admission  Medication Sig Dispense Refill Last Dose/Taking   azithromycin (ZITHROMAX) 250 MG tablet Take 1 tablet (250 mg total) by mouth daily. 4 each 0 Past Month   ferrous sulfate 325 (65 FE) MG tablet Take 1 tablet (325 mg total) by mouth daily with breakfast. 90 tablet 0 07/30/2023   oxyCODONE-acetaminophen (PERCOCET/ROXICET) 5-325 MG tablet Take by mouth every 6 (six) hours as needed for severe pain (pain score 7-10) or moderate pain (pain score 4-6).   07/30/2023 at  6:00 PM   traMADol (ULTRAM) 50 MG tablet Take 1-2 tablets (50-100 mg total) by mouth every 6 (six) hours as needed for moderate pain (pain score 4-6). 30 tablet 0 Past Month   albuterol (VENTOLIN HFA) 108 (90 Base) MCG/ACT inhaler Inhale 1-2 puffs into the lungs every 6 (six) hours as needed for shortness of breath. 2 each 1 More than a month   lidocaine-prilocaine (EMLA) cream Apply to affected area once 30 g 3    ondansetron (ZOFRAN) 8 MG tablet Take 1 tablet (8 mg total) by mouth every 8 (eight) hours as needed for nausea or vomiting. Start  on the third day after chemotherapy. 30 tablet 1    polyethylene glycol (MIRALAX) 17 g packet Take 17 g by mouth 2 (two) times daily. 30 each 0 More than a month   prochlorperazine (COMPAZINE) 10 MG tablet Take 1 tablet (10 mg total) by mouth every 6 (six) hours as needed for nausea or vomiting. 30 tablet 1    triamcinolone (NASACORT ALLERGY 24HR) 55 MCG/ACT AERO nasal inhaler Place 1 spray into the nose daily as needed (congestion).   Unknown    Current Facility-Administered Medications  Medication Dose Route Frequency Provider Last Rate Last Admin   ceFAZolin (ANCEF) IVPB 2g/100 mL premix  2 g Intravenous On Call to OR Karie Soda, MD       chlorhexidine (PERIDEX) 0.12 % solution 15 mL  15  mL Mouth/Throat Once Lewie Loron, MD       Or   Oral care mouth rinse  15 mL Mouth Rinse Once Lewie Loron, MD       Chlorhexidine Gluconate Cloth 2 % PADS 6 each  6 each Topical Once Karie Soda, MD       And   Chlorhexidine Gluconate Cloth 2 % PADS 6 each  6 each Topical Once Karie Soda, MD       lactated ringers infusion   Intravenous Continuous Lewie Loron, MD 10 mL/hr at 07/31/23 1210 New Bag at 07/31/23 1210     Allergies  Allergen Reactions   Hydrocodone     Jittery    BP 101/77   Pulse 91   Temp 98.2 F (36.8 C) (Oral)   Resp 16   Ht 5\' 4"  (1.626 m)   Wt 49.4 kg   LMP  (LMP Unknown)   SpO2 96%   BMI 18.69 kg/m   Labs: No results found for this or any previous visit (from the past 48 hours).  Imaging / Studies: DG Abd 1 View Result Date: 07/22/2023 CLINICAL DATA:  Abdominal pain. History of colon carcinoma with partial colectomy on 06/26/2023. EXAM: ABDOMEN - 1 VIEW COMPARISON:  06/30/2023 FINDINGS: Normal bowel gas pattern without evidence of obstruction or ileus. Surgical bowel anastomosis again visualized near the expected region of the distal transverse colon. No signs of free air. No abnormal calcifications. Visualized bony structures are unremarkable.  IMPRESSION: No acute findings. Surgical bowel anastomosis near the expected region of the distal transverse colon. Electronically Signed   By: Irish Lack M.D.   On: 07/22/2023 11:55     .Ardeth Sportsman, M.D., F.A.C.S. Gastrointestinal and Minimally Invasive Surgery [facility] [address] [place], [place] [address] [phone] Main / Paging  07/31/2023 1:43 PM    Ardeth Sportsman

## 2023-07-31 NOTE — Anesthesia Procedure Notes (Signed)
Procedure Name: LMA Insertion Date/Time: 07/31/2023 2:50 PM  Performed by: Deri Fuelling, CRNAPre-anesthesia Checklist: Patient identified, Emergency Drugs available, Suction available and Patient being monitored Patient Re-evaluated:Patient Re-evaluated prior to induction Oxygen Delivery Method: Circle system utilized Preoxygenation: Pre-oxygenation with 100% oxygen Induction Type: IV induction Ventilation: Mask ventilation without difficulty LMA: LMA with gastric port inserted LMA Size: 4.0 Tube type: Oral Number of attempts: 1 Airway Equipment and Method: Stylet and Oral airway Placement Confirmation: ETT inserted through vocal cords under direct vision, positive ETCO2 and breath sounds checked- equal and bilateral Tube secured with: Tape Dental Injury: Teeth and Oropharynx as per pre-operative assessment

## 2023-08-01 ENCOUNTER — Encounter (HOSPITAL_COMMUNITY): Payer: Self-pay | Admitting: Surgery

## 2023-08-01 ENCOUNTER — Other Ambulatory Visit: Payer: Self-pay

## 2023-08-05 ENCOUNTER — Inpatient Hospital Stay: Payer: Medicaid Other

## 2023-08-05 NOTE — Assessment & Plan Note (Signed)
-  [id number] with mesentery node and possible liver metastasis, G2, MMR proficient -Patient initially resented with abdominal pain to the emergency room, underwent a colonoscopy in October 2024 which showed a large mass in the splenic lecture of left side colon. -She underwent left hemicolectomy on June 26, 2023.  Large mesenteric mass was also biopsied which confirmed metastatic adenocarcinoma. -Staging CT scan and a liver MRI showed a suspicious 0.8 cm mass in the left lobe, concerning for metastasis.  No other evidence of distant metastatic disease.  I recommend ultrasound-guided left lobe liver mass biopsy. -I discussed her metastatic disease and the probable incurable nature of her disease. -I recommend systemic treatment with chemotherapy FOLFOX. -NGS Tempus on her surgical sample is pending  -Her tumor was tested of for MMR which were normal, she is not a candidate for immunotherapy.

## 2023-08-06 ENCOUNTER — Inpatient Hospital Stay: Payer: Medicaid Other

## 2023-08-06 ENCOUNTER — Other Ambulatory Visit (INDEPENDENT_AMBULATORY_CARE_PROVIDER_SITE_OTHER): Payer: Self-pay | Admitting: Primary Care

## 2023-08-06 ENCOUNTER — Inpatient Hospital Stay (HOSPITAL_BASED_OUTPATIENT_CLINIC_OR_DEPARTMENT_OTHER): Payer: Medicaid Other | Admitting: Hematology

## 2023-08-06 ENCOUNTER — Encounter: Payer: Self-pay | Admitting: Hematology

## 2023-08-06 ENCOUNTER — Inpatient Hospital Stay: Payer: Medicaid Other | Admitting: Dietician

## 2023-08-06 VITALS — BP 121/90 | HR 99 | Temp 97.7°F | Resp 19 | Wt 109.8 lb

## 2023-08-06 DIAGNOSIS — C189 Malignant neoplasm of colon, unspecified: Secondary | ICD-10-CM

## 2023-08-06 DIAGNOSIS — C772 Secondary and unspecified malignant neoplasm of intra-abdominal lymph nodes: Secondary | ICD-10-CM

## 2023-08-06 DIAGNOSIS — Z95828 Presence of other vascular implants and grafts: Secondary | ICD-10-CM | POA: Insufficient documentation

## 2023-08-06 DIAGNOSIS — Z5111 Encounter for antineoplastic chemotherapy: Secondary | ICD-10-CM | POA: Diagnosis not present

## 2023-08-06 LAB — CBC WITH DIFFERENTIAL (CANCER CENTER ONLY)
Abs Immature Granulocytes: 0.02 10*3/uL (ref 0.00–0.07)
Basophils Absolute: 0 10*3/uL (ref 0.0–0.1)
Basophils Relative: 0 %
Eosinophils Absolute: 0.2 10*3/uL (ref 0.0–0.5)
Eosinophils Relative: 3 %
HCT: 33.5 % — ABNORMAL LOW (ref 36.0–46.0)
Hemoglobin: 11.2 g/dL — ABNORMAL LOW (ref 12.0–15.0)
Immature Granulocytes: 0 %
Lymphocytes Relative: 26 %
Lymphs Abs: 2.1 10*3/uL (ref 0.7–4.0)
MCH: 20.7 pg — ABNORMAL LOW (ref 26.0–34.0)
MCHC: 33.4 g/dL (ref 30.0–36.0)
MCV: 61.9 fL — ABNORMAL LOW (ref 80.0–100.0)
Monocytes Absolute: 1 10*3/uL (ref 0.1–1.0)
Monocytes Relative: 13 %
Neutro Abs: 4.7 10*3/uL (ref 1.7–7.7)
Neutrophils Relative %: 58 %
Platelet Count: 479 10*3/uL — ABNORMAL HIGH (ref 150–400)
RBC: 5.41 MIL/uL — ABNORMAL HIGH (ref 3.87–5.11)
RDW: 19.5 % — ABNORMAL HIGH (ref 11.5–15.5)
WBC Count: 8.1 10*3/uL (ref 4.0–10.5)
nRBC: 0 % (ref 0.0–0.2)

## 2023-08-06 LAB — CMP (CANCER CENTER ONLY)
ALT: 5 U/L (ref 0–44)
AST: 11 U/L — ABNORMAL LOW (ref 15–41)
Albumin: 3.3 g/dL — ABNORMAL LOW (ref 3.5–5.0)
Alkaline Phosphatase: 74 U/L (ref 38–126)
Anion gap: 7 (ref 5–15)
BUN: 9 mg/dL (ref 6–20)
CO2: 25 mmol/L (ref 22–32)
Calcium: 9.3 mg/dL (ref 8.9–10.3)
Chloride: 104 mmol/L (ref 98–111)
Creatinine: 0.52 mg/dL (ref 0.44–1.00)
GFR, Estimated: >60 mL/min (ref >60)
Glucose, Bld: 116 mg/dL — ABNORMAL HIGH (ref 70–99)
Potassium: 3.8 mmol/L (ref 3.5–5.1)
Sodium: 136 mmol/L (ref 135–145)
Total Bilirubin: 0.3 mg/dL (ref 0.0–1.2)
Total Protein: 7.1 g/dL (ref 6.5–8.1)

## 2023-08-06 MED ORDER — ALTEPLASE 2 MG IJ SOLR
2.0000 mg | Freq: Once | INTRAMUSCULAR | Status: AC
  Administered 2023-08-06: 2 mg
  Filled 2023-08-06: qty 2

## 2023-08-06 MED ORDER — DEXTROSE 5 % IV SOLN: INTRAVENOUS | Status: DC

## 2023-08-06 MED ORDER — FLUOROURACIL CHEMO INJECTION 2.5 GM/50ML
400.0000 mg/m2 | Freq: Once | INTRAVENOUS | Status: AC
  Administered 2023-08-06: 600 mg via INTRAVENOUS
  Filled 2023-08-06: qty 12

## 2023-08-06 MED ORDER — LORAZEPAM 1 MG PO TABS
0.5000 mg | ORAL_TABLET | Freq: Once | ORAL | Status: AC
  Administered 2023-08-06: 0.5 mg via ORAL
  Filled 2023-08-06: qty 1

## 2023-08-06 MED ORDER — DEXAMETHASONE SODIUM PHOSPHATE 10 MG/ML IJ SOLN
10.0000 mg | Freq: Once | INTRAMUSCULAR | Status: AC
  Administered 2023-08-06: 10 mg via INTRAVENOUS
  Filled 2023-08-06: qty 1

## 2023-08-06 MED ORDER — PALONOSETRON HCL INJECTION 0.25 MG/5ML
0.2500 mg | Freq: Once | INTRAVENOUS | Status: AC
  Administered 2023-08-06: 0.25 mg via INTRAVENOUS
  Filled 2023-08-06: qty 5

## 2023-08-06 MED ORDER — FLUCONAZOLE 100 MG PO TABS
100.0000 mg | ORAL_TABLET | Freq: Every day | ORAL | 0 refills | Status: DC
Start: 2023-08-06 — End: 2023-08-20

## 2023-08-06 MED ORDER — OXALIPLATIN CHEMO INJECTION 100 MG/20ML
85.0000 mg/m2 | Freq: Once | INTRAVENOUS | Status: AC
  Administered 2023-08-06: 130 mg via INTRAVENOUS
  Filled 2023-08-06: qty 20

## 2023-08-06 MED ORDER — MIRTAZAPINE 7.5 MG PO TABS: 7.5000 mg | ORAL_TABLET | Freq: Every day | ORAL | 0 refills | Status: DC

## 2023-08-06 MED ORDER — OXYCODONE-ACETAMINOPHEN 5-325 MG PO TABS: 1.0000 | ORAL_TABLET | Freq: Three times a day (TID) | ORAL | 0 refills | Status: DC | PRN

## 2023-08-06 MED ORDER — DEXTROSE 5 % IV SOLN
400.0000 mg/m2 | Freq: Once | INTRAVENOUS | Status: AC
  Administered 2023-08-06: 600 mg via INTRAVENOUS
  Filled 2023-08-06: qty 30

## 2023-08-06 MED ORDER — SODIUM CHLORIDE 0.9% FLUSH
10.0000 mL | Freq: Once | INTRAVENOUS | Status: AC
  Administered 2023-08-06: 10 mL

## 2023-08-06 MED ORDER — SODIUM CHLORIDE 0.9 % IV SOLN
2400.0000 mg/m2 | INTRAVENOUS | Status: DC
  Administered 2023-08-06: 3500 mg via INTRAVENOUS
  Filled 2023-08-06: qty 70

## 2023-08-06 NOTE — Telephone Encounter (Signed)
Please place referral per patient

## 2023-08-06 NOTE — Progress Notes (Signed)
[facility]   [phone] [phone]   Clinic Follow up Note   Patient Care Team: [name], NP as PCP - General (Internal Medicine) [name], MD as Consulting Physician (Hematology and Oncology) [name], MD as Consulting Physician (General Surgery) [name], MD as Consulting Physician (Gastroenterology)  Date of Service:  08/06/2023  CHIEF COMPLAINT: f/u of colon cancer  CURRENT THERAPY:  First-line chemotherapy FOLFOX  Oncology History   Colon cancer metastasized to mesenteric lymph nodes (HCC) -[id number] with mesentery node and possible liver metastasis, G2, MMR proficient -Patient initially resented with abdominal pain to the emergency room, underwent a colonoscopy in October 2024 which showed a large mass in the splenic lecture of left side colon. -She underwent left hemicolectomy on June 26, 2023.  Large mesenteric mass was also biopsied which confirmed metastatic adenocarcinoma. -Staging CT scan and a liver MRI showed a suspicious 0.8 cm mass in the left lobe, concerning for metastasis.  No other evidence of distant metastatic disease.  I recommend ultrasound-guided left lobe liver mass biopsy. -I discussed her metastatic disease and the probable incurable nature of her disease. -I recommend systemic treatment with chemotherapy FOLFOX. -NGS Tempus on her surgical sample is pending  -Her tumor was tested of for MMR which were normal, she is not a candidate for immunotherapy.    Assessment and Plan    Colon Cancer 53 year old with colon cancer, currently undergoing chemotherapy. Completed chemotherapy class with no questions. Chemotherapy scheduled biweekly, duration dependent on liver metastasis. If no liver involvement, treatment lasts six months, with the first three months being critical. Explained potential change in treatment duration if liver metastasis is present. - Proceed with chemotherapy as  scheduled - Monitor for side effects and adjust treatment as necessary - Schedule liver biopsy to assess for metastasis - Ensure follow-up with dietician and social worker  Decreased Appetite Reports decreased appetite and difficulty eating full meals. Suggested mirtazapine to stimulate appetite and improve sleep. Discussed synthetic marijuana as a second-line option if mirtazapine is ineffective. - Prescribe mirtazapine for appetite stimulation and sleep improvement - Consider synthetic marijuana if mirtazapine is ineffective  Post-Surgical Pain Reports ongoing abdominal pain post-surgery, managed with Percocet every six hours as needed. Pain affects sleep. Discussed potential for narcotic dependence and importance of gradual dose reduction. Switched to oxycodone for a few days to assess pain control. - Switch to oxycodone for the next few days - Refill prescription for 30 tablets of Percocet - Consider referral to a pain specialist if pain persists after a few months - Encourage gradual reduction of narcotic use  Vaginal Discharge Reports whitish vaginal discharge post-surgery, suspecting a fungal infection. No history of fungal infections but received prophylactic medication post-surgery. Suspected yeast infection, recommended fluconazole. Advised follow-up with gynecologist if symptoms persist. - Prescribe one dose of fluconazole - Advise follow-up with gynecologist if symptoms persist  General Health Maintenance Blood counts within normal limits, allowing for continuation of chemotherapy. Has not yet seen a dietician but has met with a Child psychotherapist. - Ensure meeting with dietician - Follow up with social worker for additional support  PLAN - Schedule liver biopsy - Follow up on medication costs with social worker - Ensure meeting with dietician -Lab reviewed, adequate for treatment, will proceed for cycle FOLFOX today - Continue chemotherapy every two weeks - Follow up in two  weeks for next chemotherapy session.  -I called in mirtazapine, and refilled Percocet for her  SUMMARY OF ONCOLOGIC HISTORY: Oncology History  Colon cancer metastasized to mesenteric lymph nodes (HCC)  06/26/2023 Cancer Staging   Staging form: Colon and Rectum, AJCC 8th Edition - Pathologic stage from 06/26/2023: Stage IVC (pT4a, pN1b, pM1c) - Signed by [name], MD on 07/25/2023 Residual tumor (R): R1 - Microscopic   07/25/2023 Initial Diagnosis   Colon cancer metastasized to mesenteric lymph nodes (HCC)   08/06/2023 -  Chemotherapy   Patient is on Treatment Plan : COLORECTAL FOLFOX + Bevacizumab q14d        Discussed the use of AI scribe software for clinical note transcription with the patient, who gave verbal consent to proceed.  History of Present Illness   The patient, a 53 year old female with a recent diagnosis of colon cancer, presents with concerns about her upcoming chemotherapy treatment. She has attended a chemotherapy class and has no questions about the treatment itself. However, she has several medication-related issues. She reports needing nausea medication, pain medication, and antibiotics, as she suspects she has developed an infection in her surgical incision. She describes a white discharge, which she believes is indicative of an infection. She denies any associated pain or itching.  The patient also reports ongoing abdominal pain since her surgery, which she manages with Percocet. She takes the medication as needed, usually at night, to help her sleep. She reports that the pain is sometimes triggered by the food she eats. She has been on pain medication since her surgery and was previously on Tramadol, which was ineffective.  The patient also reports a lack of appetite and is seeking medication to stimulate her appetite. She describes her appetite as small and states that she can't eat much of the food she wants to eat. She also mentions a concern about a liver  biopsy that was ordered but never scheduled.         All other systems were reviewed with the patient and are negative.  MEDICAL HISTORY:  Past Medical History:  Diagnosis Date   Anemia    Asthma    Bartholin cyst    Chiari malformation type I (HCC)    Hernia, inguinal    Hernia, inguinal    Hypertension    ILD (interstitial lung disease) (HCC)     SURGICAL HISTORY: Past Surgical History:  Procedure Laterality Date   fibroid freezing     unsure   IR GENERIC HISTORICAL  07/27/2014   IR RADIOLOGIST EVAL & MGMT 07/27/2014 [name], MD [facility] INTERV RAD   PORTACATH PLACEMENT N/A 07/31/2023   Procedure: PLACEMENT OF PORT A CATHETER UNDER ULTRASOUND AND FLUOROSCOPIC GUIDANCE;  Surgeon: Karie Soda, MD;  Location: [facility];  Service: General;  Laterality: N/A;  GEN WITH ERAS 1.5 HOURS TOTAL   ROBOTIC ASSISTED TOTAL HYSTERECTOMY N/A 09/15/2018   Procedure: XI ROBOTIC ASSISTED TOTAL HYSTERECTOMY WITH SALPINGECTOMY;  Surgeon: Willodean Rosenthal, MD;  Location: [facility] ;  Service: Gynecology;  Laterality: N/A;    I have reviewed the social history and family history with the patient and they are unchanged from previous note.  ALLERGIES:  is allergic to hydrocodone.  MEDICATIONS:  Current Outpatient Medications  Medication Sig Dispense Refill   fluconazole (DIFLUCAN) 100 MG tablet Take 1 tablet (100 mg total) by mouth daily. 1 tablet 0   albuterol (VENTOLIN HFA) 108 (90 Base) MCG/ACT inhaler Inhale 1-2 puffs into the lungs every 6 (six) hours as needed for shortness of breath. 2 each 1   azithromycin (ZITHROMAX) 250 MG tablet Take  1 tablet (250 mg total) by mouth daily. 4 each 0   ferrous sulfate 325 (65 FE) MG tablet Take 1 tablet (325 mg total) by mouth daily with breakfast. 90 tablet 0   lidocaine-prilocaine (EMLA) cream Apply to affected area once 30 g 3   ondansetron (ZOFRAN) 8 MG tablet Take 1 tablet (8 mg total) by mouth every 8 (eight) hours as needed for  nausea or vomiting. Start on the third day after chemotherapy. 30 tablet 1   oxyCODONE-acetaminophen (PERCOCET/ROXICET) 5-325 MG tablet Take 1 tablet by mouth every 8 (eight) hours as needed for severe pain (pain score 7-10) or moderate pain (pain score 4-6). 30 tablet 0   polyethylene glycol (MIRALAX) 17 g packet Take 17 g by mouth 2 (two) times daily. 30 each 0   prochlorperazine (COMPAZINE) 10 MG tablet Take 1 tablet (10 mg total) by mouth every 6 (six) hours as needed for nausea or vomiting. 30 tablet 1   traMADol (ULTRAM) 50 MG tablet Take 1-2 tablets (50-100 mg total) by mouth every 6 (six) hours as needed for moderate pain (pain score 4-6). 30 tablet 0   triamcinolone (NASACORT ALLERGY 24HR) 55 MCG/ACT AERO nasal inhaler Place 1 spray into the nose daily as needed (congestion).     No current facility-administered medications for this visit.   Facility-Administered Medications Ordered in Other Visits  Medication Dose Route Frequency Provider Last Rate Last Admin   dextrose 5 % solution   Intravenous Continuous [name], MD 10 mL/hr at 08/06/23 1235 New Bag at 08/06/23 1235   fluorouracil (ADRUCIL) 3,500 mg in sodium chloride 0.9 % 80 mL chemo infusion  2,400 mg/m2 (Treatment Plan Recorded) Intravenous 1 day or 1 dose [name], MD       fluorouracil (ADRUCIL) chemo injection 600 mg  400 mg/m2 (Treatment Plan Recorded) Intravenous Once [name], MD       leucovorin 600 mg in dextrose 5 % 250 mL infusion  400 mg/m2 (Treatment Plan Recorded) Intravenous Once [name], MD 140 mL/hr at 08/06/23 1330 600 mg at 08/06/23 1330   oxaliplatin (ELOXATIN) 130 mg in dextrose 5 % 500 mL chemo infusion  85 mg/m2 (Treatment Plan Recorded) Intravenous Once [name], MD 263 mL/hr at 08/06/23 1328 130 mg at 08/06/23 1328    PHYSICAL EXAMINATION: ECOG PERFORMANCE STATUS: 1 - Symptomatic but completely ambulatory  Vitals:   08/06/23 1135  BP: (!) 121/90  Pulse: 99  Resp: 19  Temp: 97.7 F (36.5 C)   SpO2: 99%   Wt Readings from Last 3 Encounters:  08/06/23 109 lb 12.8 oz (49.8 kg)  07/31/23 108 lb 14.5 oz (49.4 kg)  07/30/23 109 lb (49.4 kg)     GENERAL:alert, no distress and comfortable SKIN: skin color, texture, turgor are normal, no rashes or significant lesions EYES: normal, Conjunctiva are pink and non-injected, sclera clear NECK: supple, thyroid normal size, non-tender, without nodularity LYMPH:  no palpable lymphadenopathy in the cervical, axillary  LUNGS: clear to auscultation and percussion with normal breathing effort HEART: regular rate & rhythm and no murmurs and no lower extremity edema ABDOMEN:abdomen soft, non-tender and normal bowel sounds Musculoskeletal:no cyanosis of digits and no clubbing  NEURO: alert & oriented x 3 with fluent speech, no focal motor/sensory deficits   LABORATORY DATA:  I have reviewed the data as listed    Latest Ref Rng & Units 08/06/2023   11:17 AM 07/25/2023   12:35 PM 07/18/2023    2:44 PM  CBC  WBC 4.0 - 10.5 K/uL 8.1  9.3  9.1   Hemoglobin 12.0 - 15.0 g/dL 21.3  08.6  57.8   Hematocrit 36.0 - 46.0 % 33.5  32.7  32.6   Platelets 150 - 400 K/uL 479  398  606.0         Latest Ref Rng & Units 08/06/2023   11:17 AM 07/25/2023   12:35 PM 07/18/2023    2:44 PM  CMP  Glucose 70 - 99 mg/dL 469  629  528   BUN 6 - 20 mg/dL 9  8  11    Creatinine 0.44 - 1.00 mg/dL 4.13  2.44  0.10   Sodium 135 - 145 mmol/L 136  140  138   Potassium 3.5 - 5.1 mmol/L 3.8  3.7  3.9   Chloride 98 - 111 mmol/L 104  105  100   CO2 22 - 32 mmol/L 25  28  30    Calcium 8.9 - 10.3 mg/dL 9.3  9.5  9.3   Total Protein 6.5 - 8.1 g/dL 7.1  7.4  7.6   Total Bilirubin 0.0 - 1.2 mg/dL 0.3  0.3  0.3   Alkaline Phos 38 - 126 U/L 74  76  82   AST 15 - 41 U/L 11  12  11    ALT 0 - 44 U/L 5  7  5        RADIOGRAPHIC STUDIES: I have personally reviewed the radiological images as listed and agreed with the findings in the report. No results found.    Orders Placed  This Encounter  Procedures   Ambulatory referral to Social Work    Referral Priority:   Routine    Referral Type:   Consultation    Referral Reason:   Specialty Services Required    Number of Visits Requested:   1   All questions were answered. The patient knows to call the clinic with any problems, questions or concerns. No barriers to learning was detected. The total time spent in the appointment was 30 minutes.     [name], MD 08/06/2023

## 2023-08-06 NOTE — Patient Instructions (Addendum)
Liver Biopsy has been scheduled for February 10th. Arrive at [facility] Entrance A Admitting at 11am. You will need a driver and someone to stay with you for 24 hours.  Nothing to eat or drink after 7am. You may take blood pressure, heart and seizure medications with small sips of water. NO BLOOD THINNERS.     [facility]  Discharge Instructions: Thank you for choosing [facility] to provide your oncology and hematology care.   If you have a lab appointment with the Cancer Center, please go directly to the Cancer Center and check in at the registration area.   Wear comfortable clothing and clothing appropriate for easy access to any Portacath or PICC line.   We strive to give you quality time with your provider. You may need to reschedule your appointment if you arrive late (15 or more minutes).  Arriving late affects you and other patients whose appointments are after yours.  Also, if you miss three or more appointments without notifying the office, you may be dismissed from the clinic at the provider's discretion.      For prescription refill requests, have your pharmacy contact our office and allow 72 hours for refills to be completed.    Today you received the following chemotherapy and/or immunotherapy agents oxaliplatin, leucovorin, fluorourcil      To help prevent nausea and vomiting after your treatment, we encourage you to take your nausea medication as directed.  BELOW ARE SYMPTOMS THAT SHOULD BE REPORTED IMMEDIATELY: *FEVER GREATER THAN 100.4 F (38 C) OR HIGHER *CHILLS OR SWEATING *NAUSEA AND VOMITING THAT IS NOT CONTROLLED WITH YOUR NAUSEA MEDICATION *UNUSUAL SHORTNESS OF BREATH *UNUSUAL BRUISING OR BLEEDING *URINARY PROBLEMS (pain or burning when urinating, or frequent urination) *BOWEL PROBLEMS (unusual diarrhea, constipation, pain near the anus) TENDERNESS IN MOUTH AND THROAT WITH OR WITHOUT  PRESENCE OF ULCERS (sore throat, sores in mouth, or a toothache) UNUSUAL RASH, SWELLING OR PAIN  UNUSUAL VAGINAL DISCHARGE OR ITCHING   Items with * indicate a potential emergency and should be followed up as soon as possible or go to the Emergency Department if any problems should occur.  Please show the CHEMOTHERAPY ALERT CARD or IMMUNOTHERAPY ALERT CARD at check-in to the Emergency Department and triage nurse.  Should you have questions after your visit or need to cancel or reschedule your appointment, please contact [facility]  Dept: [phone]  and follow the prompts.  Office hours are 8:00 a.m. to 4:30 p.m. Monday - Friday. Please note that voicemails left after 4:00 p.m. may not be returned until the following business day.  We are closed weekends and major holidays. You have access to a nurse at all times for urgent questions. Please call the main number to the clinic Dept: [phone] and follow the prompts.   For any non-urgent questions, you may also contact your provider using MyChart. We now offer e-Visits for anyone 44 and older to request care online for non-urgent symptoms. For details visit [email or web address].   Also download the MyChart app! Go to the app store, search "MyChart", open the app, select [facility], and log in with your MyChart username and password.

## 2023-08-06 NOTE — Progress Notes (Signed)
Nutrition Assessment   Reason for Assessment: Referral    ASSESSMENT: 53 year old female with colon cancer metastatic to mesenteric lymph. S/p resection 12/12. She is receiving Folfox + Bevacizumab q14d. Patient is under the care of Dr. Mosetta Putt.  Past medical history includes HTN, hyperthyroidism, nicotine dependence  Met with patient and her cousin in infusion. Patient was given ativan for anxiousness prior to visit. Patient reports early satiety and altered taste. Patient unable to tolerate more than a few bites at a time, but will return to plate for additional bites when ready. Yesterday she had 2 spoonfuls of oatmeal for breakfast. Had roast with rice for lunch. Recalls piece of whiting, side salad, roll for dinner. Patient previously drinking strawberry Ensure HP. She stopped drinking after consuming one that tasted spoiled. Patient did check date and it was still good. Patient denies constipation, diarrhea.   Nutrition Focused Physical Exam: Unable to complete at this time   Medications: ferrous sulfate, diflucan, remeron, percocet   Labs: albumin 3.3   Anthropometrics:   Height: 5'4" Weight: 109 lb 12.8 oz  UBW: 136 lb (04/01/23) BMI: 18.85   NUTRITION DIAGNOSIS: Unintended wt loss related to cancer as evidenced by 20% decrease from usual weight in 4 months. This is severe for time frame.   INTERVENTION:  Educated on importance of adequate calorie/protein energy intake to preserve LBM, maintain strength, support post-op healing Continue small frequent meals and choosing high calorie, high protein foods - snack ideas + high protein foods list provided  Offered suggestions on ways to add calories and protein to foods (adding Ensure to coffee or hot chocolate, using butter/gravy/cream) Patient is agreeable to trying Ensure Complete, recommend 2/day as tolerated - samples provided Educated on cold sensitivity side effects of Oxaliplatin, offered warm supplement suggestions -  handout with tips + ideas provided  Contact information given   MONITORING, EVALUATION, GOAL: Patient will tolerate increased calories and protein to minimize further wt loss during treatment    Next Visit: Wednesday February 5 during infusion

## 2023-08-07 ENCOUNTER — Telehealth: Payer: Self-pay

## 2023-08-07 ENCOUNTER — Other Ambulatory Visit: Payer: Self-pay

## 2023-08-07 ENCOUNTER — Inpatient Hospital Stay: Payer: Medicaid Other | Admitting: Licensed Clinical Social Worker

## 2023-08-07 DIAGNOSIS — C189 Malignant neoplasm of colon, unspecified: Secondary | ICD-10-CM

## 2023-08-07 NOTE — Telephone Encounter (Signed)
-----   Message from Nurse Guilford Shi sent at 08/06/2023  4:20 PM EST ----- Regarding: FT Chemo- Feng Pt had first time folfox today. Tolerated well. Dr. Mosetta Putt

## 2023-08-07 NOTE — Progress Notes (Signed)
[facility] CSW Progress Note  Clinical Social Worker  spoke w/ pt's daughter Lochlynn Kusek [phone]) regarding financial concerns.   Per Rishetta pt resides w/ her w/ a family member always available to stay w/ pt and provide support as necessary.  Pt has a son and a sister who are actively involved in addition to Rishetta.  Pt reportedly has applied for both Medicaid and food stamps.  CSW reiterated that pt will need to provide proof of income as well as an award letter for food stamps in order to apply for the [employer].  Pt's daughter to call CSW as soon as food stamps have been approved to start the application process.  Per Rishetta the family has been able to assist pt in obtaining her medication while Medicaid is pending.  CSW informed Rishetta of the food pantry as well.  CSW to remain available as appropriate throughout duration of treatment to provide support.      Rachel Moulds, LCSW Clinical Social Worker [facility]

## 2023-08-07 NOTE — Telephone Encounter (Signed)
Carol Mack states that she is doing fine. She is eating, drinking, and urinating well. She knows to call the office at [phone] if  she has any questions or concerns.

## 2023-08-08 ENCOUNTER — Inpatient Hospital Stay: Payer: Self-pay

## 2023-08-08 VITALS — BP 117/87 | HR 81 | Temp 98.9°F | Resp 18

## 2023-08-08 DIAGNOSIS — C189 Malignant neoplasm of colon, unspecified: Secondary | ICD-10-CM

## 2023-08-08 DIAGNOSIS — Z5111 Encounter for antineoplastic chemotherapy: Secondary | ICD-10-CM | POA: Diagnosis not present

## 2023-08-08 MED ORDER — SODIUM CHLORIDE 0.9% FLUSH
10.0000 mL | INTRAVENOUS | Status: DC | PRN
Start: 2023-08-08 — End: 2023-08-08
  Administered 2023-08-08: 10 mL

## 2023-08-08 MED ORDER — HEPARIN SOD (PORK) LOCK FLUSH 100 UNIT/ML IV SOLN
500.0000 [IU] | Freq: Once | INTRAVENOUS | Status: AC | PRN
  Administered 2023-08-08: 500 [IU]

## 2023-08-08 NOTE — Progress Notes (Signed)
FMLA forms completed as requested. Forms e-mailed to Employer as requested. Confirmation received. Copy of forms picked up by Daughter. No other needs or concerns noted at this time.

## 2023-08-09 ENCOUNTER — Encounter: Payer: Self-pay | Admitting: Hematology

## 2023-08-12 ENCOUNTER — Other Ambulatory Visit: Payer: Self-pay | Admitting: General Surgery

## 2023-08-12 DIAGNOSIS — G8918 Other acute postprocedural pain: Secondary | ICD-10-CM

## 2023-08-20 ENCOUNTER — Other Ambulatory Visit: Payer: Self-pay | Admitting: *Deleted

## 2023-08-20 ENCOUNTER — Inpatient Hospital Stay: Payer: Medicaid Other | Admitting: Dietician

## 2023-08-20 ENCOUNTER — Other Ambulatory Visit: Payer: Self-pay

## 2023-08-20 ENCOUNTER — Inpatient Hospital Stay (HOSPITAL_BASED_OUTPATIENT_CLINIC_OR_DEPARTMENT_OTHER): Payer: Medicaid Other | Admitting: Nurse Practitioner

## 2023-08-20 ENCOUNTER — Inpatient Hospital Stay: Payer: Medicaid Other | Attending: Hematology

## 2023-08-20 ENCOUNTER — Inpatient Hospital Stay: Payer: Medicaid Other

## 2023-08-20 ENCOUNTER — Encounter: Payer: Self-pay | Admitting: Nurse Practitioner

## 2023-08-20 VITALS — BP 122/93 | HR 75

## 2023-08-20 VITALS — BP 137/103 | HR 87 | Temp 97.8°F | Resp 15 | Wt 112.2 lb

## 2023-08-20 DIAGNOSIS — C189 Malignant neoplasm of colon, unspecified: Secondary | ICD-10-CM

## 2023-08-20 DIAGNOSIS — Z5112 Encounter for antineoplastic immunotherapy: Secondary | ICD-10-CM | POA: Diagnosis present

## 2023-08-20 DIAGNOSIS — C772 Secondary and unspecified malignant neoplasm of intra-abdominal lymph nodes: Secondary | ICD-10-CM | POA: Insufficient documentation

## 2023-08-20 DIAGNOSIS — Z5111 Encounter for antineoplastic chemotherapy: Secondary | ICD-10-CM | POA: Diagnosis present

## 2023-08-20 DIAGNOSIS — Z9079 Acquired absence of other genital organ(s): Secondary | ICD-10-CM | POA: Diagnosis not present

## 2023-08-20 DIAGNOSIS — R519 Headache, unspecified: Secondary | ICD-10-CM | POA: Diagnosis not present

## 2023-08-20 DIAGNOSIS — Z95828 Presence of other vascular implants and grafts: Secondary | ICD-10-CM

## 2023-08-20 LAB — CBC WITH DIFFERENTIAL (CANCER CENTER ONLY)
Abs Immature Granulocytes: 0.01 10*3/uL (ref 0.00–0.07)
Basophils Absolute: 0 10*3/uL (ref 0.0–0.1)
Basophils Relative: 1 %
Eosinophils Absolute: 0.2 10*3/uL (ref 0.0–0.5)
Eosinophils Relative: 3 %
HCT: 30.5 % — ABNORMAL LOW (ref 36.0–46.0)
Hemoglobin: 10.3 g/dL — ABNORMAL LOW (ref 12.0–15.0)
Immature Granulocytes: 0 %
Lymphocytes Relative: 49 %
Lymphs Abs: 2.8 10*3/uL (ref 0.7–4.0)
MCH: 21.1 pg — ABNORMAL LOW (ref 26.0–34.0)
MCHC: 33.8 g/dL (ref 30.0–36.0)
MCV: 62.4 fL — ABNORMAL LOW (ref 80.0–100.0)
Monocytes Absolute: 0.5 10*3/uL (ref 0.1–1.0)
Monocytes Relative: 10 %
Neutro Abs: 2.1 10*3/uL (ref 1.7–7.7)
Neutrophils Relative %: 37 %
Platelet Count: 345 10*3/uL (ref 150–400)
RBC: 4.89 MIL/uL (ref 3.87–5.11)
RDW: 19.3 % — ABNORMAL HIGH (ref 11.5–15.5)
WBC Count: 5.6 10*3/uL (ref 4.0–10.5)
nRBC: 0 % (ref 0.0–0.2)

## 2023-08-20 LAB — CMP (CANCER CENTER ONLY)
ALT: 8 U/L (ref 0–44)
AST: 13 U/L — ABNORMAL LOW (ref 15–41)
Albumin: 3.4 g/dL — ABNORMAL LOW (ref 3.5–5.0)
Alkaline Phosphatase: 63 U/L (ref 38–126)
Anion gap: 6 (ref 5–15)
BUN: 8 mg/dL (ref 6–20)
CO2: 26 mmol/L (ref 22–32)
Calcium: 8.8 mg/dL — ABNORMAL LOW (ref 8.9–10.3)
Chloride: 107 mmol/L (ref 98–111)
Creatinine: 0.6 mg/dL (ref 0.44–1.00)
GFR, Estimated: >60 mL/min (ref >60)
Glucose, Bld: 152 mg/dL — ABNORMAL HIGH (ref 70–99)
Potassium: 3.4 mmol/L — ABNORMAL LOW (ref 3.5–5.1)
Sodium: 139 mmol/L (ref 135–145)
Total Bilirubin: 0.4 mg/dL (ref 0.0–1.2)
Total Protein: 6.7 g/dL (ref 6.5–8.1)

## 2023-08-20 LAB — MISCELLANEOUS TEST

## 2023-08-20 LAB — TOTAL PROTEIN, URINE DIPSTICK: Protein, ur: NEGATIVE mg/dL

## 2023-08-20 MED ORDER — PALONOSETRON HCL INJECTION 0.25 MG/5ML
0.2500 mg | Freq: Once | INTRAVENOUS | Status: AC
  Administered 2023-08-20: 0.25 mg via INTRAVENOUS
  Filled 2023-08-20: qty 5

## 2023-08-20 MED ORDER — SODIUM CHLORIDE 0.9 % IV SOLN: INTRAVENOUS | Status: DC

## 2023-08-20 MED ORDER — LEUCOVORIN CALCIUM INJECTION 350 MG
400.0000 mg/m2 | Freq: Once | INTRAVENOUS | Status: AC
  Administered 2023-08-20: 600 mg via INTRAVENOUS
  Filled 2023-08-20: qty 30

## 2023-08-20 MED ORDER — BEVACIZUMAB-ADCD CHEMO INJECTION 400 MG/16ML
5.0000 mg/kg | Freq: Once | INTRAVENOUS | Status: AC
  Administered 2023-08-20: 250 mg via INTRAVENOUS
  Filled 2023-08-20: qty 10

## 2023-08-20 MED ORDER — LORAZEPAM 0.5 MG PO TABS: 0.5000 mg | ORAL_TABLET | Freq: Two times a day (BID) | ORAL | 0 refills | Status: DC | PRN

## 2023-08-20 MED ORDER — ACETAMINOPHEN 325 MG PO TABS
650.0000 mg | ORAL_TABLET | Freq: Once | ORAL | Status: AC
  Administered 2023-08-20: 650 mg via ORAL
  Filled 2023-08-20: qty 2

## 2023-08-20 MED ORDER — DEXTROSE 5 % IV SOLN: INTRAVENOUS | Status: DC

## 2023-08-20 MED ORDER — LORAZEPAM 1 MG PO TABS
0.5000 mg | ORAL_TABLET | Freq: Once | ORAL | Status: AC
  Administered 2023-08-20: 0.5 mg via ORAL
  Filled 2023-08-20: qty 1

## 2023-08-20 MED ORDER — SODIUM CHLORIDE 0.9 % IV SOLN
2400.0000 mg/m2 | INTRAVENOUS | Status: DC
  Administered 2023-08-20: 3500 mg via INTRAVENOUS
  Filled 2023-08-20: qty 70

## 2023-08-20 MED ORDER — SODIUM CHLORIDE 0.9% FLUSH
10.0000 mL | Freq: Once | INTRAVENOUS | Status: AC
  Administered 2023-08-20: 10 mL

## 2023-08-20 MED ORDER — DEXAMETHASONE SODIUM PHOSPHATE 10 MG/ML IJ SOLN
10.0000 mg | Freq: Once | INTRAMUSCULAR | Status: AC
  Administered 2023-08-20: 10 mg via INTRAVENOUS
  Filled 2023-08-20: qty 1

## 2023-08-20 MED ORDER — OXALIPLATIN CHEMO INJECTION 100 MG/20ML
85.0000 mg/m2 | Freq: Once | INTRAVENOUS | Status: AC
  Administered 2023-08-20: 130 mg via INTRAVENOUS
  Filled 2023-08-20: qty 20

## 2023-08-20 MED ORDER — FLUOROURACIL CHEMO INJECTION 2.5 GM/50ML
400.0000 mg/m2 | Freq: Once | INTRAVENOUS | Status: AC
  Administered 2023-08-20: 600 mg via INTRAVENOUS
  Filled 2023-08-20: qty 12

## 2023-08-20 NOTE — Progress Notes (Addendum)
 Patient Care Team: Carol Rosaline SQUIBB, NP as PCP - General (Internal Medicine) Carol Callander, MD as Consulting Physician (Hematology and Oncology) Carol Hila, MD as Consulting Physician (General Surgery) Carol Glendia BRAVO, MD as Consulting Physician (Gastroenterology)   CHIEF COMPLAINT: Follow-up colon cancer  Oncology History  Colon cancer metastasized to mesenteric lymph nodes (HCC)  06/26/2023 Cancer Staging   Staging form: Colon and Rectum, AJCC 8th Edition - Pathologic stage from 06/26/2023: Stage IVC (pT4a, pN1b, pM1c) - Signed by Carol Callander, MD on 07/25/2023 Residual tumor (R): R1 - Microscopic   07/25/2023 Initial Diagnosis   Colon cancer metastasized to mesenteric lymph nodes (HCC)   08/06/2023 -  Chemotherapy   Patient is on Treatment Plan : COLORECTAL FOLFOX + Bevacizumab  q14d        CURRENT THERAPY: First-line chemotherapy FOLFOX every 2 weeks, starting 1/22, bevacizumab  added with cycle 2  INTERVAL HISTORY Carol Mack returns for follow-up and treatment as scheduled, last seen by Dr. Wilmer 1/22 to begin FOLFOX.  She was tired on day 2 and 3 but still able to be out of bed and up at home.  She is eating and drinking, able to move around more, some baseline exertional dyspnea. Bowels fluctuate but overall normal. Has intermittent dysuria, no frequency/urgency or hematuria.  Denies nausea/vomiting, abdominal pain, fever, chills, cough, chest pain, dyspnea, leg edema, cold sensitivity.  She has a headache today and is requesting Tylenol .  ROS  All other systems reviewed and negative  Past Medical History:  Diagnosis Date   Anemia    Asthma    Bartholin cyst    Chiari malformation type I (HCC)    Hernia, inguinal    Hernia, inguinal    Hypertension    ILD (interstitial lung disease) (HCC)      Past Surgical History:  Procedure Laterality Date   fibroid freezing     unsure   IR GENERIC HISTORICAL  07/27/2014   IR RADIOLOGIST EVAL & MGMT 07/27/2014 Carol Balder, MD  [facility] INTERV RAD   PORTACATH PLACEMENT N/A 07/31/2023   Procedure: PLACEMENT OF PORT A CATHETER UNDER ULTRASOUND AND FLUOROSCOPIC GUIDANCE;  Surgeon: Carol Standing, MD;  Location: [facility];  Service: General;  Laterality: N/A;  GEN WITH ERAS 1.5 HOURS TOTAL   ROBOTIC ASSISTED TOTAL HYSTERECTOMY N/A 09/15/2018   Procedure: XI ROBOTIC ASSISTED TOTAL HYSTERECTOMY WITH SALPINGECTOMY;  Surgeon: Carol Coy, MD;  Location: [facility];  Service: Gynecology;  Laterality: N/A;     Outpatient Encounter Medications as of 08/20/2023  Medication Sig Note   albuterol  (VENTOLIN  HFA) 108 (90 Base) MCG/ACT inhaler Inhale 1-2 puffs into the lungs every 6 (six) hours as needed for shortness of breath.    ferrous sulfate  325 (65 FE) MG tablet Take 1 tablet (325 mg total) by mouth daily with breakfast.    lidocaine -prilocaine  (EMLA ) cream Apply to affected area once 07/31/2023: Hasn't started   mirtazapine  (REMERON ) 7.5 MG tablet Take 1 tablet (7.5 mg total) by mouth at bedtime.    ondansetron  (ZOFRAN ) 8 MG tablet Take 1 tablet (8 mg total) by mouth every 8 (eight) hours as needed for nausea or vomiting. Start on the third day after chemotherapy. 07/31/2023: Hasn't started   polyethylene glycol (MIRALAX ) 17 g packet Take 17 g by mouth 2 (two) times daily.    prochlorperazine  (COMPAZINE ) 10 MG tablet Take 1 tablet (10 mg total) by mouth every 6 (six) hours as needed for nausea or vomiting. 07/31/2023: Hasn't started  triamcinolone (NASACORT ALLERGY 24HR) 55 MCG/ACT AERO nasal inhaler Place 1 spray into the nose daily as needed (congestion).    [DISCONTINUED] oxyCODONE -acetaminophen  (PERCOCET/ROXICET) 5-325 MG tablet Take 1 tablet by mouth every 8 (eight) hours as needed for severe pain (pain score 7-10) or moderate pain (pain score 4-6).    [DISCONTINUED] azithromycin  (ZITHROMAX ) 250 MG tablet Take 1 tablet (250 mg total) by mouth daily. (Patient not taking: Reported on 08/20/2023)    [DISCONTINUED]  fluconazole  (DIFLUCAN ) 100 MG tablet Take 1 tablet (100 mg total) by mouth daily. (Patient not taking: Reported on 08/20/2023)    [DISCONTINUED] traMADol  (ULTRAM ) 50 MG tablet Take 1-2 tablets (50-100 mg total) by mouth every 6 (six) hours as needed for moderate pain (pain score 4-6). (Patient not taking: Reported on 08/20/2023)    No facility-administered encounter medications on file as of 08/20/2023.     Today's Vitals   08/20/23 1113 08/20/23 1114  BP: (!) 137/103   Pulse: 87   Resp: 15   Temp: 97.8 F (36.6 C)   TempSrc: Temporal   SpO2: 98%   Weight: 112 lb 3.2 oz (50.9 kg)   PainSc:  0-No pain   Body mass index is 19.26 kg/m.   PHYSICAL EXAM GENERAL:alert, no distress and comfortable SKIN: no rash  EYES: sclera clear NECK: without mass LYMPH:  no palpable cervical or supraclavicular lymphadenopathy  LUNGS: clear with normal breathing effort HEART: regular rate & rhythm, no lower extremity edema ABDOMEN: abdomen soft, non-tender and normal bowel sounds NEURO: alert & oriented x 3 with fluent speech, no focal motor/sensory deficits PAC without erythema    CBC    Component Value Date/Time   WBC 5.6 08/20/2023 1027   WBC 9.3 07/25/2023 1235   RBC 4.89 08/20/2023 1027   HGB 10.3 (L) 08/20/2023 1027   HCT 30.5 (L) 08/20/2023 1027   PLT 345 08/20/2023 1027   MCV 62.4 (L) 08/20/2023 1027   MCH 21.1 (L) 08/20/2023 1027   MCHC 33.8 08/20/2023 1027   RDW 19.3 (H) 08/20/2023 1027   LYMPHSABS 2.8 08/20/2023 1027   MONOABS 0.5 08/20/2023 1027   EOSABS 0.2 08/20/2023 1027   BASOSABS 0.0 08/20/2023 1027     CMP     Component Value Date/Time   NA 139 08/20/2023 1027   K 3.4 (L) 08/20/2023 1027   CL 107 08/20/2023 1027   CO2 26 08/20/2023 1027   GLUCOSE 152 (H) 08/20/2023 1027   BUN 8 08/20/2023 1027   CREATININE 0.60 08/20/2023 1027   CALCIUM  8.8 (L) 08/20/2023 1027   PROT 6.7 08/20/2023 1027   ALBUMIN 3.4 (L) 08/20/2023 1027   AST 13 (L) 08/20/2023 1027   ALT 8  08/20/2023 1027   ALKPHOS 63 08/20/2023 1027   BILITOT 0.4 08/20/2023 1027   GFRNONAA >60 08/20/2023 1027   GFRAA >60 06/09/2019 1613     ASSESSMENT & PLAN: 53 year old female  Colon cancer metastasized to mesenteric lymph nodes -pT4aN1bM1c with mesentery node and possible liver metastasis, G2, MMR proficient -Patient initially presented with abdominal pain to the emergency room, underwent a colonoscopy in October 2024 which showed a large mass in the splenic lecture of left side colon. -S/p left hemicolectomy on 06/26/23.  Large mesenteric mass was also biopsied which confirmed metastatic adenocarcinoma. -Staging CT scan and a liver MRI showed a suspicious 0.8 cm mass in the left lobe, concerning for metastasis.  No other evidence of distant metastatic disease. Biopsy 08/27/23 -MMR is intact, not a candidate for immunotherapy -  NGS TEMPUS has been requested today -Carol Mack appears stable.  She tolerated cycle 1 FOLFOX well overall with mild fatigue.  She was able to recover and function well after a few days, good PS.  No clinical evidence of recurrence/progression -We reviewed her treatment plan and labs, adequate to proceed with cycle 2 FOLFOX and add bevacizumab  today.  She is aware of the rationale, potential benefit, and side effects -Follow-up and cycle 3 in 2 weeks     PLAN: -Labs reviewed, UA is pending -Proceed with C2 FOLFOX and first Beva today as planned, no dose adjustment -Tylenol  x1 in clinic for headache. Rx: ativan  po PRN for anxiety/outbursts at home -Liver biopsy 08/27/23 as scheduled -Request NGS tempus on surgical sample -F/up and C3 in 2 weeks    All questions were answered. The patient knows to call the clinic with any problems, questions or concerns. No barriers to learning were detected.   Carlous Olivares, NP-C 08/20/2023

## 2023-08-20 NOTE — Patient Instructions (Signed)
 Liver Biopsy has been scheduled for February 10th. Arrive at [facility] Entrance A Admitting at 11am. You will need a driver and someone to stay with you for 24 hours.  Nothing to eat or drink after 7am. You may take blood pressure, heart and seizure medications with small sips of water. NO BLOOD THINNERS.     [facility]  Discharge Instructions: Thank you for choosing [facility] to provide your oncology and hematology care.   If you have a lab appointment with the Cancer Center, please go directly to the Cancer Center and check in at the registration area.   Wear comfortable clothing and clothing appropriate for easy access to any Portacath or PICC line.   We strive to give you quality time with your provider. You may need to reschedule your appointment if you arrive late (15 or more minutes).  Arriving late affects you and other patients whose appointments are after yours.  Also, if you miss three or more appointments without notifying the office, you may be dismissed from the clinic at the provider's discretion.      For prescription refill requests, have your pharmacy contact our office and allow 72 hours for refills to be completed.    Today you received the following chemotherapy and/or immunotherapy agents oxaliplatin, leucovorin, fluorourcil      To help prevent nausea and vomiting after your treatment, we encourage you to take your nausea medication as directed.  BELOW ARE SYMPTOMS THAT SHOULD BE REPORTED IMMEDIATELY: *FEVER GREATER THAN 100.4 F (38 C) OR HIGHER *CHILLS OR SWEATING *NAUSEA AND VOMITING THAT IS NOT CONTROLLED WITH YOUR NAUSEA MEDICATION *UNUSUAL SHORTNESS OF BREATH *UNUSUAL BRUISING OR BLEEDING *URINARY PROBLEMS (pain or burning when urinating, or frequent urination) *BOWEL PROBLEMS (unusual diarrhea, constipation, pain near the anus) TENDERNESS IN MOUTH AND THROAT WITH OR WITHOUT  PRESENCE OF ULCERS (sore throat, sores in mouth, or a toothache) UNUSUAL RASH, SWELLING OR PAIN  UNUSUAL VAGINAL DISCHARGE OR ITCHING   Items with * indicate a potential emergency and should be followed up as soon as possible or go to the Emergency Department if any problems should occur.  Please show the CHEMOTHERAPY ALERT CARD or IMMUNOTHERAPY ALERT CARD at check-in to the Emergency Department and triage nurse.  Should you have questions after your visit or need to cancel or reschedule your appointment, please contact [facility]  Dept: [phone]  and follow the prompts.  Office hours are 8:00 a.m. to 4:30 p.m. Monday - Friday. Please note that voicemails left after 4:00 p.m. may not be returned until the following business day.  We are closed weekends and major holidays. You have access to a nurse at all times for urgent questions. Please call the main number to the clinic Dept: [phone] and follow the prompts.   For any non-urgent questions, you may also contact your provider using MyChart. We now offer e-Visits for anyone 44 and older to request care online for non-urgent symptoms. For details visit [email or web address].   Also download the MyChart app! Go to the app store, search "MyChart", open the app, select [facility], and log in with your MyChart username and password.

## 2023-08-20 NOTE — Addendum Note (Signed)
 Addended by: Kristen Bushway K on: 08/20/2023 01:29 PM   Modules accepted: Orders

## 2023-08-20 NOTE — Progress Notes (Signed)
 Tempus drawn on Case#[id number] and blood sample drawn today while pt was in infusion.

## 2023-08-20 NOTE — Progress Notes (Signed)
 Nutrition Follow-up:  Pt with colon cancer metastatic to mesenteric lymph. S/p resection 12/12. She is receiving Folfox + Bevacizumab  q14d. Patient is under the care of Dr. Lanny.   Met with patient in infusion. Patient requesting additional sugar packets for coffee as she is unable to taste this. Patient agreeable to trying vanilla Ensure in coffee instead. She reports appetite has picked up and eating 3 good meals. Yesterday, had bacon, eggs, grits for breakfast, roast beef sandwich for lunch, pot roast, cornbread, mac/cheese, collards for dinner. Patient denies nutrition impact symptoms at this time.   Medications: reviewed   Labs: K 3.4, glucose 152, albumin 3.4  Anthropometrics: Wt 112 lb 3.2 oz today increased   1/22 - 109 lb 12.8 oz    NUTRITION DIAGNOSIS: Unintended wt loss improving    INTERVENTION:  Continue high calorie high protein foods to promote wt gain Encouraged daily Ensure Complete as tolerated    MONITORING, EVALUATION, GOAL: wt trends, intake   NEXT VISIT: To be scheduled as needed

## 2023-08-22 ENCOUNTER — Inpatient Hospital Stay: Payer: Medicaid Other

## 2023-08-22 DIAGNOSIS — Z95828 Presence of other vascular implants and grafts: Secondary | ICD-10-CM

## 2023-08-22 DIAGNOSIS — Z5112 Encounter for antineoplastic immunotherapy: Secondary | ICD-10-CM | POA: Diagnosis not present

## 2023-08-22 MED ORDER — SODIUM CHLORIDE 0.9% FLUSH
10.0000 mL | Freq: Once | INTRAVENOUS | Status: AC
  Administered 2023-08-22: 10 mL

## 2023-08-22 MED ORDER — HEPARIN SOD (PORK) LOCK FLUSH 100 UNIT/ML IV SOLN
500.0000 [IU] | Freq: Once | INTRAVENOUS | Status: AC
  Administered 2023-08-22: 500 [IU]

## 2023-08-26 ENCOUNTER — Other Ambulatory Visit: Payer: Self-pay | Admitting: Radiology

## 2023-08-26 DIAGNOSIS — C189 Malignant neoplasm of colon, unspecified: Secondary | ICD-10-CM

## 2023-08-27 ENCOUNTER — Other Ambulatory Visit: Payer: Self-pay

## 2023-08-27 ENCOUNTER — Encounter: Payer: Self-pay | Admitting: Hematology

## 2023-08-27 ENCOUNTER — Encounter (HOSPITAL_COMMUNITY): Payer: Self-pay

## 2023-08-27 ENCOUNTER — Ambulatory Visit (HOSPITAL_COMMUNITY)
Admission: RE | Admit: 2023-08-27 | Discharge: 2023-08-27 | Disposition: A | Discharge status: Home / Self Care | Payer: Medicaid Other | Source: Ambulatory Visit | Attending: Hematology | Admitting: Hematology

## 2023-08-27 ENCOUNTER — Other Ambulatory Visit: Payer: Self-pay | Admitting: Hematology

## 2023-08-27 DIAGNOSIS — C189 Malignant neoplasm of colon, unspecified: Secondary | ICD-10-CM | POA: Diagnosis present

## 2023-08-27 DIAGNOSIS — C787 Secondary malignant neoplasm of liver and intrahepatic bile duct: Secondary | ICD-10-CM

## 2023-08-27 DIAGNOSIS — C772 Secondary and unspecified malignant neoplasm of intra-abdominal lymph nodes: Secondary | ICD-10-CM | POA: Insufficient documentation

## 2023-08-27 LAB — CBC
HCT: 31.8 % — ABNORMAL LOW (ref 36.0–46.0)
Hemoglobin: 10.6 g/dL — ABNORMAL LOW (ref 12.0–15.0)
MCH: 21.2 pg — ABNORMAL LOW (ref 26.0–34.0)
MCHC: 33.3 g/dL (ref 30.0–36.0)
MCV: 63.7 fL — ABNORMAL LOW (ref 80.0–100.0)
Platelets: 386 10*3/uL (ref 150–400)
RBC: 4.99 MIL/uL (ref 3.87–5.11)
RDW: 20.9 % — ABNORMAL HIGH (ref 11.5–15.5)
WBC: 4.9 10*3/uL (ref 4.0–10.5)
nRBC: 0.4 % — ABNORMAL HIGH (ref 0.0–0.2)

## 2023-08-27 LAB — PROTIME-INR
INR: 1.1 (ref 0.8–1.2)
Prothrombin Time: 14.4 s (ref 11.4–15.2)

## 2023-08-27 MED ORDER — LIDOCAINE HCL (PF) 1 % IJ SOLN
10.0000 mL | Freq: Once | INTRAMUSCULAR | Status: AC
  Administered 2023-08-27: 10 mL

## 2023-08-27 MED ORDER — ONDANSETRON HCL 4 MG/2ML IJ SOLN: 4.0000 mg | Freq: Four times a day (QID) | INTRAMUSCULAR | Status: DC | PRN

## 2023-08-27 MED ORDER — OXYCODONE HCL 5 MG PO TABS: 5.0000 mg | ORAL_TABLET | ORAL | Status: DC | PRN

## 2023-08-27 MED ORDER — MIDAZOLAM HCL 2 MG/2ML IJ SOLN
INTRAMUSCULAR | Status: AC
  Filled 2023-08-27: qty 2

## 2023-08-27 MED ORDER — FENTANYL CITRATE (PF) 100 MCG/2ML IJ SOLN
INTRAMUSCULAR | Status: AC | PRN
  Administered 2023-08-27: 50 ug via INTRAVENOUS
  Administered 2023-08-27 (×2): 25 ug via INTRAVENOUS

## 2023-08-27 MED ORDER — FENTANYL CITRATE (PF) 100 MCG/2ML IJ SOLN
INTRAMUSCULAR | Status: AC
  Filled 2023-08-27: qty 2

## 2023-08-27 MED ORDER — MIDAZOLAM HCL 2 MG/2ML IJ SOLN
INTRAMUSCULAR | Status: AC | PRN
Start: 2023-08-27 — End: 2023-08-27
  Administered 2023-08-27: 1 mg via INTRAVENOUS
  Administered 2023-08-27: .5 mg via INTRAVENOUS

## 2023-08-27 MED ORDER — SODIUM CHLORIDE 0.9 % IV SOLN: INTRAVENOUS | Status: DC

## 2023-08-27 NOTE — H&P (Signed)
Chief Complaint: colon cancer, liver lesion - image guided liver lesion biopsy   Referring Provider(s): [name]   Supervising Physician: Roanna Banning  Patient Status: [facility] - Out-pt  History of Present Illness: Carol Mack is a 53 y.o. female with a history of anemia, asthma, hypertension, and colon cancer.  Pt was initially diagnosed after an emergency room visit for abdominal pain in 04/2023 where she underwent a colonoscopy and biopsy of a mesenteric mass that was subsequently found to be metastatic adenocarcinoma.  Pt is currently undergoing chemotherapy treatment for colon cancer; a staging CT scan was obtained on 05/11/23 that revealed an indistinct 6 mm lesion in segment 2 of the liver.  This lesion has increased to 8 mm as shown on liver MRI on 06/05/23.  Interventional radiology was consulted for image guided liver lesion biopsy.    Patient is Full Code  Past Medical History:  Diagnosis Date   Anemia    Asthma    Bartholin cyst    Chiari malformation type I (HCC)    Hernia, inguinal    Hernia, inguinal    Hypertension    ILD (interstitial lung disease) (HCC)     Past Surgical History:  Procedure Laterality Date   fibroid freezing     unsure   IR GENERIC HISTORICAL  07/27/2014   IR RADIOLOGIST EVAL & MGMT 07/27/2014 [name], MD [facility] INTERV RAD   PORTACATH PLACEMENT N/A 07/31/2023   Procedure: PLACEMENT OF PORT A CATHETER UNDER ULTRASOUND AND FLUOROSCOPIC GUIDANCE;  Surgeon: Karie Soda, MD;  Location: [facility];  Service: General;  Laterality: N/A;  GEN WITH ERAS 1.5 HOURS TOTAL   ROBOTIC ASSISTED TOTAL HYSTERECTOMY N/A 09/15/2018   Procedure: XI ROBOTIC ASSISTED TOTAL HYSTERECTOMY WITH SALPINGECTOMY;  Surgeon: Willodean Rosenthal, MD;  Location: [facility];  Service: Gynecology;  Laterality: N/A;    Allergies: Hydrocodone  Medications: Prior to Admission medications   Medication Sig Start Date End Date Taking? Authorizing Provider   ferrous sulfate 325 (65 FE) MG tablet Take 1 tablet (325 mg total) by mouth daily with breakfast. 07/21/23  Yes [name], MD  LORazepam (ATIVAN) 0.5 MG tablet Take 1 tablet (0.5 mg total) by mouth every 12 (twelve) hours as needed for anxiety. 08/20/23  Yes Pollyann Samples, NP  mirtazapine (REMERON) 7.5 MG tablet Take 1 tablet (7.5 mg total) by mouth at bedtime. 08/06/23  Yes [name], MD  oxyCODONE-acetaminophen (PERCOCET/ROXICET) 5-325 MG tablet Take by mouth every 4 (four) hours as needed for severe pain (pain score 7-10).   Yes [provider]  polyethylene glycol (MIRALAX) 17 g packet Take 17 g by mouth 2 (two) times daily. 05/11/23  Yes [name], MD  albuterol (VENTOLIN HFA) 108 (90 Base) MCG/ACT inhaler Inhale 1-2 puffs into the lungs every 6 (six) hours as needed for shortness of breath. 12/30/22   Milas Hock, MD  lidocaine-prilocaine (EMLA) cream Apply to affected area once 07/25/23   [name], MD  ondansetron (ZOFRAN) 8 MG tablet Take 1 tablet (8 mg total) by mouth every 8 (eight) hours as needed for nausea or vomiting. Start on the third day after chemotherapy. 07/25/23   [name], MD  prochlorperazine (COMPAZINE) 10 MG tablet Take 1 tablet (10 mg total) by mouth every 6 (six) hours as needed for nausea or vomiting. 07/25/23   [name], MD  triamcinolone (NASACORT ALLERGY 24HR) 55 MCG/ACT AERO nasal inhaler Place 1 spray into the nose daily as needed (  congestion).    [provider]     Family History  Problem Relation Age of Onset   Thyroid disease Mother        large benign goiter   Cancer Maternal Uncle        UNKNOWN TYPE CANCER   Colon cancer Neg Hx    Esophageal cancer Neg Hx    Rectal cancer Neg Hx    Stomach cancer Neg Hx     Social History   Socioeconomic History   Marital status: Single    Spouse name: Not on file   Number of children: 2   Years of education: Not on file   Highest education level: Not on file  Occupational History    Not on file  Tobacco Use   Smoking status: Former    Current packs/day: 1.00    Average packs/day: 1 pack/day for 29.1 years (29.1 ttl pk-yrs)    Types: Cigarettes    Start date: 1996    Passive exposure: Current   Smokeless tobacco: Never  Vaping Use   Vaping status: Never Used  Substance and Sexual Activity   Alcohol use: No   Drug use: No   Sexual activity: Not on file  Other Topics Concern   Not on file  Social History Narrative   Not on file   Social Drivers of Health   Financial Resource Strain: Not on file  Food Insecurity: No Food Insecurity (06/26/2023)   Hunger Vital Sign    Worried About Running Out of Food in the Last Year: Never true    Ran Out of Food in the Last Year: Never true  Transportation Needs: No Transportation Needs (06/26/2023)   PRAPARE - Administrator, Civil Service (Medical): No    Lack of Transportation (Non-Medical): No  Physical Activity: Not on file  Stress: Not on file  Social Connections: Not on file     Review of Systems: A 12 point ROS discussed and pertinent positives are indicated in the HPI above.  All other systems are negative.  Review of Systems  Constitutional:  Negative for chills, fatigue and fever.  Respiratory:  Negative for cough, shortness of breath and wheezing.   Gastrointestinal:  Negative for diarrhea, nausea and vomiting.  Neurological:  Negative for dizziness and headaches.  Psychiatric/Behavioral:  Negative for agitation, behavioral problems and confusion.     Vital Signs: BP (!) 145/90   Pulse 74   Temp 98.3 F (36.8 C) (Oral)   Resp 17   Ht 5\' 4"  (1.626 m)   Wt 114 lb (51.7 kg)   LMP  (LMP Unknown)   SpO2 100%   BMI 19.57 kg/m   Advance Care Plan: The advanced care place/surrogate decision maker was discussed at the time of visit and the patient did not wish to discuss or was not able to name a surrogate decision maker or provide an advance care plan.  Physical Exam Constitutional:       Appearance: She is well-developed.  HENT:     Head: Atraumatic.     Mouth/Throat:     Mouth: Mucous membranes are moist.  Cardiovascular:     Rate and Rhythm: Normal rate and regular rhythm.     Heart sounds: No murmur heard. Pulmonary:     Effort: Pulmonary effort is normal.     Breath sounds: Normal breath sounds.  Abdominal:     General: Bowel sounds are normal.     Palpations: Abdomen is soft.  Musculoskeletal:        General: Normal range of motion.  Skin:    General: Skin is warm.  Neurological:     Mental Status: She is alert and oriented to person, place, and time.  Psychiatric:        Mood and Affect: Mood normal.        Behavior: Behavior normal.     Imaging: X-ray chest PA or AP Result Date: 07/31/2023 CLINICAL DATA:  Port-A-Cath placement. EXAM: CHEST  1 VIEW COMPARISON:  Chest radiograph dated 06/30/2023. FINDINGS: Right-sided Port-A-Cath with tip over central SVC. The tip of the catheter veins superiorly within the SVC or at the junction with the innominate vein. No consolidative changes. Background of chronic intra coarsening. There is no pleural effusion or pneumothorax. Stable cardiac silhouette. No acute osseous pathology. IMPRESSION: Right-sided Port-A-Cath with tip curved in the SVC or innominate vein junction. No pneumothorax. Electronically Signed   By: Elgie Collard M.D.   On: 07/31/2023 16:19   DG C-Arm 1-60 Min-No Report Result Date: 07/31/2023 Fluoroscopy was utilized by the requesting physician.  No radiographic interpretation.    Labs:  CBC: Recent Labs    07/25/23 1235 08/06/23 1117 08/20/23 1027 08/27/23 0632  WBC 9.3 8.1 5.6 4.9  HGB 11.1* 11.2* 10.3* 10.6*  HCT 32.7* 33.5* 30.5* 31.8*  PLT 398 479* 345 386    COAGS: Recent Labs    08/27/23 0632  INR 1.1    BMP: Recent Labs    06/29/23 0448 07/18/23 1444 07/25/23 1235 08/06/23 1117 08/20/23 1027  NA 133* 138 140 136 139  K 3.6 3.9 3.7 3.8 3.4*  CL 103 100 105 104  107  CO2 24 30 28 25 26   GLUCOSE 91 126* 110* 116* 152*  BUN 14 11 8 9 8   CALCIUM 8.3* 9.3 9.5 9.3 8.8*  CREATININE 0.61 0.63 0.58 0.52 0.60  GFRNONAA >60  --  >60 >60 >60    LIVER FUNCTION TESTS: Recent Labs    07/18/23 1444 07/25/23 1235 08/06/23 1117 08/20/23 1027  BILITOT 0.3 0.3 0.3 0.4  AST 11 12* 11* 13*  ALT 5 7 5 8   ALKPHOS 82 76 74 63  PROT 7.6 7.4 7.1 6.7  ALBUMIN 3.6 3.5 3.3* 3.4*    TUMOR MARKERS: Recent Labs    05/13/23 1232  CEA 14.4*    Assessment and Plan:  Pt has colon cancer with possible metastasis to the liver and current liver lesion; she is scheduled for image guided liver lesion biopsy with interventional radiology 08/27/23.    Risks and benefits of image guided liver lesion biopsy was discussed with the patient and/or patient's family including, but not limited to bleeding, infection, damage to adjacent structures or low yield requiring additional tests.  All of the questions were answered and there is agreement to proceed.  Consent signed and in chart.   Thank you for allowing our service to participate in Franklin [place] 's care.  Electronically Signed: Loman Brooklyn, PA-C   08/27/2023, 7:05 AM    I spent a total of  30 Minutes   in face to face in clinical consultation, greater than 50% of which was counseling/coordinating care for image guided liver lesion biopsy.

## 2023-08-27 NOTE — Procedures (Signed)
Vascular and Interventional Radiology Procedure Note  Patient: Carol Mack DOB: 07-Dec-1970 Medical Record Number: [id number] Note Date/Time: 08/27/23 8:39 AM   Performing Physician: Roanna Banning, MD Assistant(s): None  Diagnosis: Hx CRC. Small liver mass on MR   Procedure: LIVER MASS BIOPSY  Anesthesia: Conscious Sedation Complications: None Estimated Blood Loss: Minimal Specimens: Sent for Pathology  Findings:  Successful Ultrasound-guided biopsy of liver mass. A total of 3 samples were obtained. Hemostasis of the tract was achieved using Manual Pressure.  Plan: Bed rest for 2 hours.  See detailed procedure note with images in PACS. The patient tolerated the procedure well without incident or complication and was returned to Recovery in stable condition.    Roanna Banning, MD [facility]   Pager. [phone] Clinic. [phone]

## 2023-08-28 LAB — SURGICAL PATHOLOGY

## 2023-09-02 NOTE — Assessment & Plan Note (Signed)
-  [id number] with mesentery node and possible liver metastasis, G2, MMR proficient -Patient initially resented with abdominal pain to the emergency room, underwent a colonoscopy in October 2024 which showed a large mass in the splenic lecture of left side colon. -She underwent left hemicolectomy on June 26, 2023.  Large mesenteric mass was also biopsied which confirmed metastatic adenocarcinoma. -Staging CT scan and a liver MRI showed a suspicious 0.8 cm mass in the left lobe, concerning for metastasis.  No other evidence of distant metastatic disease. Liver biopsy on 08/27/2023 was benign.  -I discussed her metastatic disease and the probable incurable nature of her disease. -I recommend systemic treatment with chemotherapy FOLFOX, she started on 08/06/23. -NGS Tempus on her surgical sample is pending  -Her tumor was tested of for MMR which were normal, she is not a candidate for immunotherapy.

## 2023-09-03 ENCOUNTER — Inpatient Hospital Stay (HOSPITAL_BASED_OUTPATIENT_CLINIC_OR_DEPARTMENT_OTHER): Payer: Medicaid Other | Admitting: Hematology

## 2023-09-03 ENCOUNTER — Inpatient Hospital Stay: Payer: Medicaid Other

## 2023-09-03 ENCOUNTER — Encounter: Payer: Self-pay | Admitting: Hematology

## 2023-09-03 VITALS — BP 139/112 | HR 73

## 2023-09-03 VITALS — BP 155/104 | HR 75 | Temp 97.7°F | Resp 17 | Wt 114.3 lb

## 2023-09-03 DIAGNOSIS — Z5112 Encounter for antineoplastic immunotherapy: Secondary | ICD-10-CM | POA: Diagnosis not present

## 2023-09-03 DIAGNOSIS — Z95828 Presence of other vascular implants and grafts: Secondary | ICD-10-CM

## 2023-09-03 DIAGNOSIS — C772 Secondary and unspecified malignant neoplasm of intra-abdominal lymph nodes: Secondary | ICD-10-CM | POA: Diagnosis not present

## 2023-09-03 DIAGNOSIS — C189 Malignant neoplasm of colon, unspecified: Secondary | ICD-10-CM

## 2023-09-03 LAB — CMP (CANCER CENTER ONLY)
ALT: 6 U/L (ref 0–44)
AST: 12 U/L — ABNORMAL LOW (ref 15–41)
Albumin: 3.3 g/dL — ABNORMAL LOW (ref 3.5–5.0)
Alkaline Phosphatase: 65 U/L (ref 38–126)
Anion gap: 7 (ref 5–15)
BUN: 9 mg/dL (ref 6–20)
CO2: 25 mmol/L (ref 22–32)
Calcium: 8.7 mg/dL — ABNORMAL LOW (ref 8.9–10.3)
Chloride: 107 mmol/L (ref 98–111)
Creatinine: 0.58 mg/dL (ref 0.44–1.00)
GFR, Estimated: >60 mL/min (ref >60)
Glucose, Bld: 112 mg/dL — ABNORMAL HIGH (ref 70–99)
Potassium: 3.5 mmol/L (ref 3.5–5.1)
Sodium: 139 mmol/L (ref 135–145)
Total Bilirubin: 0.2 mg/dL (ref 0.0–1.2)
Total Protein: 6.3 g/dL — ABNORMAL LOW (ref 6.5–8.1)

## 2023-09-03 LAB — CBC WITH DIFFERENTIAL (CANCER CENTER ONLY)
Abs Immature Granulocytes: 0 10*3/uL (ref 0.00–0.07)
Basophils Absolute: 0.1 10*3/uL (ref 0.0–0.1)
Basophils Relative: 1 %
Eosinophils Absolute: 0.1 10*3/uL (ref 0.0–0.5)
Eosinophils Relative: 2 %
HCT: 30 % — ABNORMAL LOW (ref 36.0–46.0)
Hemoglobin: 10.2 g/dL — ABNORMAL LOW (ref 12.0–15.0)
Immature Granulocytes: 0 %
Lymphocytes Relative: 57 %
Lymphs Abs: 3 10*3/uL (ref 0.7–4.0)
MCH: 21.3 pg — ABNORMAL LOW (ref 26.0–34.0)
MCHC: 34 g/dL (ref 30.0–36.0)
MCV: 62.6 fL — ABNORMAL LOW (ref 80.0–100.0)
Monocytes Absolute: 0.7 10*3/uL (ref 0.1–1.0)
Monocytes Relative: 14 %
Neutro Abs: 1.4 10*3/uL — ABNORMAL LOW (ref 1.7–7.7)
Neutrophils Relative %: 26 %
Platelet Count: 282 10*3/uL (ref 150–400)
RBC: 4.79 MIL/uL (ref 3.87–5.11)
RDW: 20.9 % — ABNORMAL HIGH (ref 11.5–15.5)
WBC Count: 5.3 10*3/uL (ref 4.0–10.5)
nRBC: 0 % (ref 0.0–0.2)

## 2023-09-03 LAB — TOTAL PROTEIN, URINE DIPSTICK: Protein, ur: NEGATIVE mg/dL

## 2023-09-03 MED ORDER — DEXTROSE 5 % IV SOLN: INTRAVENOUS | Status: DC

## 2023-09-03 MED ORDER — SODIUM CHLORIDE 0.9% FLUSH
10.0000 mL | Freq: Once | INTRAVENOUS | Status: AC
  Administered 2023-09-03: 10 mL

## 2023-09-03 MED ORDER — LIDOCAINE-PRILOCAINE 2.5-2.5 % EX CREA: TOPICAL_CREAM | CUTANEOUS | 3 refills | Status: AC

## 2023-09-03 MED ORDER — LEUCOVORIN CALCIUM INJECTION 350 MG
400.0000 mg/m2 | Freq: Once | INTRAVENOUS | Status: AC
  Administered 2023-09-03: 600 mg via INTRAVENOUS
  Filled 2023-09-03 (×2): qty 30

## 2023-09-03 MED ORDER — SODIUM CHLORIDE 0.9% FLUSH
10.0000 mL | INTRAVENOUS | Status: DC | PRN
  Administered 2023-09-03: 10 mL

## 2023-09-03 MED ORDER — DEXAMETHASONE SODIUM PHOSPHATE 10 MG/ML IJ SOLN
10.0000 mg | Freq: Once | INTRAMUSCULAR | Status: AC
Start: 2023-09-03 — End: 2023-09-03
  Administered 2023-09-03: 10 mg via INTRAVENOUS
  Filled 2023-09-03: qty 1

## 2023-09-03 MED ORDER — SODIUM CHLORIDE 0.9 % IV SOLN
2400.0000 mg/m2 | INTRAVENOUS | Status: DC
  Administered 2023-09-03: 3500 mg via INTRAVENOUS
  Filled 2023-09-03: qty 70

## 2023-09-03 MED ORDER — SODIUM CHLORIDE 0.9 % IV SOLN
5.0000 mg/kg | Freq: Once | INTRAVENOUS | Status: AC
  Administered 2023-09-03: 250 mg via INTRAVENOUS
  Filled 2023-09-03: qty 10

## 2023-09-03 MED ORDER — FLUOROURACIL CHEMO INJECTION 2.5 GM/50ML
400.0000 mg/m2 | Freq: Once | INTRAVENOUS | Status: AC
  Administered 2023-09-03: 600 mg via INTRAVENOUS
  Filled 2023-09-03: qty 12

## 2023-09-03 MED ORDER — LORAZEPAM 1 MG PO TABS
0.5000 mg | ORAL_TABLET | Freq: Once | ORAL | Status: AC
  Administered 2023-09-03: 0.5 mg via ORAL
  Filled 2023-09-03: qty 1

## 2023-09-03 MED ORDER — SODIUM CHLORIDE 0.9 % IV SOLN: INTRAVENOUS | Status: DC

## 2023-09-03 MED ORDER — DEXTROSE 5 % IV SOLN
85.0000 mg/m2 | Freq: Once | INTRAVENOUS | Status: AC
  Administered 2023-09-03: 130 mg via INTRAVENOUS
  Filled 2023-09-03: qty 8

## 2023-09-03 MED ORDER — PALONOSETRON HCL INJECTION 0.25 MG/5ML
0.2500 mg | Freq: Once | INTRAVENOUS | Status: AC
Start: 2023-09-03 — End: 2023-09-03
  Administered 2023-09-03: 0.25 mg via INTRAVENOUS
  Filled 2023-09-03: qty 5

## 2023-09-03 MED ORDER — OXYCODONE HCL 5 MG PO TABS: 5.0000 mg | ORAL_TABLET | ORAL | 0 refills | Status: DC | PRN

## 2023-09-03 NOTE — Progress Notes (Signed)
[facility]   Telephone:(336) [phone] Fax:(336) [phone]   Clinic Follow up Note   Patient Care Team: [name], NP as PCP - General (Internal Medicine) [name], MD as Consulting Physician (Hematology and Oncology) [name], MD as Consulting Physician (General Surgery) [name], MD as Consulting Physician (Gastroenterology)  Date of Service:  09/03/2023  CHIEF COMPLAINT: f/u of colon cancer   CURRENT THERAPY:  Chemo FOLFOX and beva   Oncology History   Colon cancer metastasized to mesenteric lymph nodes (HCC) -[id number] with mesentery node and possible liver metastasis, G2, MMR proficient -Patient initially resented with abdominal pain to the emergency room, underwent a colonoscopy in October 2024 which showed a large mass in the splenic lecture of left side colon. -She underwent left hemicolectomy on June 26, 2023.  Large mesenteric mass was also biopsied which confirmed metastatic adenocarcinoma. -Staging CT scan and a liver MRI showed a suspicious 0.8 cm mass in the left lobe, concerning for metastasis.  No other evidence of distant metastatic disease. Liver biopsy on 08/27/2023 was benign.  -I discussed her metastatic disease and the probable incurable nature of her disease. -I recommend systemic treatment with chemotherapy FOLFOX, she started on 08/06/23. -NGS Tempus on her surgical sample is pending  -Her tumor was tested of for MMR which were normal, she is not a candidate for immunotherapy.   Assessment and Plan    Colon Cancer with Metastasis 53 year old female with colon cancer undergoing chemotherapy. Recent liver biopsy showed benign tissue, negative for carcinoma. Known mesenteric mass with cancerous lymph nodes not fully resected. Currently on third chemotherapy cycle. Goal: shrink tumor, eliminate microscopic disease, potential radiation therapy. High recurrence risk due to incomplete resection. Discussed high likelihood  of recurrence post-treatment and potential need for further interventions. Goal: achieve remission, significant chance of tumor regrowth. - Continue chemotherapy for six months - Consider radiation therapy post-chemotherapy - Monitor for recurrence with regular follow-ups  Pain Management Difficulty swallowing large oxycodone pills, prefers smaller pills. Intermittent pain, sometimes in back, not fully relieved by acetaminophen. Previously did not tolerate tramadol. Discussed risks of oxycodone dependence and importance of minimal effective dose. - Prescribe oxycodone 5 mg small pills - Advise minimal effective dose - Monitor for dependence and side effects  Dry Nose Reports dry nose, likely due to constant heat use. No signs of allergies. - Recommend over-the-counter nasal saline spray  Mild Anemia Inquired about vitamins. Advised to take over-the-counter multivitamins or prenatal vitamins. - Recommend over-the-counter multivitamins or prenatal vitamins  Plan -Reviewed her liver biopsy results -Lab reviewed, adequate for treatment.  Proceed with third cycle of chemotherapy - Monitor for numbness, tingling, and cold sensitivity - Advise to avoid cold drinks during the first week post-chemotherapy - Follow-up in 2 weeks before next cycle chemo       SUMMARY OF ONCOLOGIC HISTORY: Oncology History  Colon cancer metastasized to mesenteric lymph nodes (HCC)  06/26/2023 Cancer Staging   Staging form: Colon and Rectum, AJCC 8th Edition - Pathologic stage from 06/26/2023: Stage IVC (pT4a, pN1b, pM1c) - Signed by [name], MD on 07/25/2023 Residual tumor (R): R1 - Microscopic   07/25/2023 Initial Diagnosis   Colon cancer metastasized to mesenteric lymph nodes (HCC)   08/06/2023 -  Chemotherapy   Patient is on Treatment Plan : COLORECTAL FOLFOX + Bevacizumab q14d        Discussed the use of AI scribe software for clinical note transcription with the patient, who gave verbal consent  to  proceed.  History of Present Illness   The patient, a 53 year old female with a history of colon cancer, presents for a follow-up visit and chemotherapy treatment. She expresses concern about her liver biopsy results, which she initially misunderstood due to medical terminology. The patient also inquires about the duration of her treatment, to which the doctor responds that it will likely continue for six months, with the possibility of radiation therapy afterwards. The patient expresses hope for a quicker recovery.  The patient also reports experiencing occasional pain, which she previously managed with a small oxycodone pill. However, she stopped taking the medication due to difficulty swallowing the larger pills she was recently prescribed. She requests a return to the smaller pills for occasional use when the pain becomes unbearable. She also mentions having a dry nose, for which she requests a nasal spray.  The patient also mentions having trouble sleeping, and the doctor reminds her to take her prescribed Mirtazapine, which is intended to help with sleep and appetite. The patient acknowledges that she has not been taking this medication regularly.         All other systems were reviewed with the patient and are negative.  MEDICAL HISTORY:  Past Medical History:  Diagnosis Date   Anemia    Asthma    Bartholin cyst    Chiari malformation type I (HCC)    Hernia, inguinal    Hernia, inguinal    Hypertension    ILD (interstitial lung disease) (HCC)     SURGICAL HISTORY: Past Surgical History:  Procedure Laterality Date   fibroid freezing     unsure   IR GENERIC HISTORICAL  07/27/2014   IR RADIOLOGIST EVAL & MGMT 07/27/2014 [name], MD [facility] INTERV RAD   PORTACATH PLACEMENT N/A 07/31/2023   Procedure: PLACEMENT OF PORT A CATHETER UNDER ULTRASOUND AND FLUOROSCOPIC GUIDANCE;  Surgeon: Karie Soda, MD;  Location: [facility];  Service: General;  Laterality: N/A;  GEN WITH ERAS 1.5  HOURS TOTAL   ROBOTIC ASSISTED TOTAL HYSTERECTOMY N/A 09/15/2018   Procedure: XI ROBOTIC ASSISTED TOTAL HYSTERECTOMY WITH SALPINGECTOMY;  Surgeon: Willodean Rosenthal, MD;  Location: [facility];  Service: Gynecology;  Laterality: N/A;    I have reviewed the social history and family history with the patient and they are unchanged from previous note.  ALLERGIES:  is allergic to hydrocodone.  MEDICATIONS:  Current Outpatient Medications  Medication Sig Dispense Refill   oxyCODONE (OXY IR/ROXICODONE) 5 MG immediate release tablet Take 1 tablet (5 mg total) by mouth every 4 (four) hours as needed for severe pain (pain score 7-10). 20 tablet 0   albuterol (VENTOLIN HFA) 108 (90 Base) MCG/ACT inhaler Inhale 1-2 puffs into the lungs every 6 (six) hours as needed for shortness of breath. 2 each 1   ferrous sulfate 325 (65 FE) MG tablet Take 1 tablet (325 mg total) by mouth daily with breakfast. 90 tablet 0   lidocaine-prilocaine (EMLA) cream Apply to affected area once 30 g 3   LORazepam (ATIVAN) 0.5 MG tablet Take 1 tablet (0.5 mg total) by mouth every 12 (twelve) hours as needed for anxiety. 30 tablet 0   mirtazapine (REMERON) 7.5 MG tablet Take 1 tablet (7.5 mg total) by mouth at bedtime. 30 tablet 0   ondansetron (ZOFRAN) 8 MG tablet Take 1 tablet (8 mg total) by mouth every 8 (eight) hours as needed for nausea or vomiting. Start on the third day after chemotherapy. 30 tablet 1   polyethylene glycol (MIRALAX) 17  g packet Take 17 g by mouth 2 (two) times daily. 30 each 0   prochlorperazine (COMPAZINE) 10 MG tablet Take 1 tablet (10 mg total) by mouth every 6 (six) hours as needed for nausea or vomiting. 30 tablet 1   triamcinolone (NASACORT ALLERGY 24HR) 55 MCG/ACT AERO nasal inhaler Place 1 spray into the nose daily as needed (congestion).     No current facility-administered medications for this visit.   Facility-Administered Medications Ordered in Other Visits  Medication  Dose Route Frequency Provider Last Rate Last Admin   0.9 %  sodium chloride infusion   Intravenous Continuous [name], MD 10 mL/hr at 09/03/23 1250 New Bag at 09/03/23 1250   dextrose 5 % solution   Intravenous Continuous [name], MD 10 mL/hr at 09/03/23 1338 New Bag at 09/03/23 1338   fluorouracil (ADRUCIL) 3,500 mg in sodium chloride 0.9 % 80 mL chemo infusion  2,400 mg/m2 (Treatment Plan Recorded) Intravenous 1 day or 1 dose [name], MD       fluorouracil (ADRUCIL) chemo injection 600 mg  400 mg/m2 (Treatment Plan Recorded) Intravenous Once [name], MD       leucovorin 600 mg in dextrose 5 % 250 mL infusion  400 mg/m2 (Treatment Plan Recorded) Intravenous Once [name], MD 140 mL/hr at 09/03/23 1343 600 mg at 09/03/23 1343   oxaliplatin (ELOXATIN) 130 mg in dextrose 5 % 500 mL chemo infusion  85 mg/m2 (Treatment Plan Recorded) Intravenous Once [name], MD 263 mL/hr at 09/03/23 1341 130 mg at 09/03/23 1341   sodium chloride flush (NS) 0.9 % injection 10 mL  10 mL Intracatheter PRN [name], MD        PHYSICAL EXAMINATION: ECOG PERFORMANCE STATUS: 1 - Symptomatic but completely ambulatory  Vitals:   09/03/23 1152  BP: (!) 155/104  Pulse: 75  Resp: 17  Temp: 97.7 F (36.5 C)  SpO2: 98%   Wt Readings from Last 3 Encounters:  09/03/23 114 lb 4.8 oz (51.8 kg)  08/27/23 114 lb (51.7 kg)  08/20/23 112 lb 3.2 oz (50.9 kg)     GENERAL:alert, no distress and comfortable SKIN: skin color, texture, turgor are normal, no rashes or significant lesions EYES: normal, Conjunctiva are pink and non-injected, sclera clear NECK: supple, thyroid normal size, non-tender, without nodularity LYMPH:  no palpable lymphadenopathy in the cervical, axillary . LUNGS: clear to auscultation and percussion with normal breathing effort HEART: regular rate & rhythm and no murmurs and no lower extremity edema ABDOMEN:abdomen soft, non-tender and normal bowel sounds Musculoskeletal:no cyanosis of digits  and no clubbing  NEURO: alert & oriented x 3 with fluent speech, no focal motor/sensory deficits      LABORATORY DATA:  I have reviewed the data as listed    Latest Ref Rng & Units 09/03/2023   11:16 AM 08/27/2023    6:32 AM 08/20/2023   10:27 AM  CBC  WBC 4.0 - 10.5 K/uL 5.3  4.9  5.6   Hemoglobin 12.0 - 15.0 g/dL 16.1  09.6  04.5   Hematocrit 36.0 - 46.0 % 30.0  31.8  30.5   Platelets 150 - 400 K/uL 282  386  345         Latest Ref Rng & Units 09/03/2023   11:16 AM 08/20/2023   10:27 AM 08/06/2023   11:17 AM  CMP  Glucose 70 - 99 mg/dL 409  811  914   BUN 6 - 20 mg/dL 9  8  9  Creatinine 0.44 - 1.00 mg/dL 4.09  8.11  9.14   Sodium 135 - 145 mmol/L 139  139  136   Potassium 3.5 - 5.1 mmol/L 3.5  3.4  3.8   Chloride 98 - 111 mmol/L 107  107  104   CO2 22 - 32 mmol/L 25  26  25    Calcium 8.9 - 10.3 mg/dL 8.7  8.8  9.3   Total Protein 6.5 - 8.1 g/dL 6.3  6.7  7.1   Total Bilirubin 0.0 - 1.2 mg/dL 0.2  0.4  0.3   Alkaline Phos 38 - 126 U/L 65  63  74   AST 15 - 41 U/L 12  13  11    ALT 0 - 44 U/L 6  8  5        RADIOGRAPHIC STUDIES: I have personally reviewed the radiological images as listed and agreed with the findings in the report. No results found.    Orders Placed This Encounter  Procedures   CBC with Differential (Cancer Center Only)    Standing Status:   Future    Expected Date:   10/01/2023    Expiration Date:   09/30/2024   CMP (Cancer Center only)    Standing Status:   Future    Expected Date:   10/01/2023    Expiration Date:   09/30/2024   CBC with Differential (Cancer Center Only)    Standing Status:   Future    Expected Date:   10/15/2023    Expiration Date:   10/14/2024   CMP (Cancer Center only)    Standing Status:   Future    Expected Date:   10/15/2023    Expiration Date:   10/14/2024   Total Protein, Urine dipstick    Standing Status:   Future    Expected Date:   10/15/2023    Expiration Date:   10/14/2024   All questions were answered. The patient knows  to call the clinic with any problems, questions or concerns. No barriers to learning was detected. The total time spent in the appointment was 25 minutes.     [name], MD 09/03/2023

## 2023-09-05 ENCOUNTER — Other Ambulatory Visit: Payer: Self-pay

## 2023-09-05 ENCOUNTER — Emergency Department (HOSPITAL_COMMUNITY): Payer: Medicaid Other

## 2023-09-05 ENCOUNTER — Other Ambulatory Visit: Payer: Medicaid Other

## 2023-09-05 ENCOUNTER — Emergency Department (HOSPITAL_COMMUNITY)
Admission: EM | Admit: 2023-09-05 | Discharge: 2023-09-05 | Disposition: A | Discharge status: Home / Self Care | Payer: Medicaid Other | Attending: Emergency Medicine | Admitting: Emergency Medicine

## 2023-09-05 ENCOUNTER — Inpatient Hospital Stay: Payer: Medicaid Other

## 2023-09-05 VITALS — BP 148/110 | HR 87 | Temp 99.0°F | Resp 16

## 2023-09-05 DIAGNOSIS — R0789 Other chest pain: Secondary | ICD-10-CM | POA: Insufficient documentation

## 2023-09-05 DIAGNOSIS — I10 Essential (primary) hypertension: Secondary | ICD-10-CM | POA: Diagnosis not present

## 2023-09-05 DIAGNOSIS — Z85038 Personal history of other malignant neoplasm of large intestine: Secondary | ICD-10-CM | POA: Insufficient documentation

## 2023-09-05 DIAGNOSIS — Z79899 Other long term (current) drug therapy: Secondary | ICD-10-CM | POA: Diagnosis not present

## 2023-09-05 DIAGNOSIS — Z5112 Encounter for antineoplastic immunotherapy: Secondary | ICD-10-CM | POA: Diagnosis not present

## 2023-09-05 DIAGNOSIS — R079 Chest pain, unspecified: Secondary | ICD-10-CM

## 2023-09-05 DIAGNOSIS — Z95828 Presence of other vascular implants and grafts: Secondary | ICD-10-CM

## 2023-09-05 LAB — CBC WITH DIFFERENTIAL/PLATELET
Abs Immature Granulocytes: 0.02 10*3/uL (ref 0.00–0.07)
Basophils Absolute: 0.1 10*3/uL (ref 0.0–0.1)
Basophils Relative: 1 %
Eosinophils Absolute: 0 10*3/uL (ref 0.0–0.5)
Eosinophils Relative: 1 %
HCT: 33.4 % — ABNORMAL LOW (ref 36.0–46.0)
Hemoglobin: 11 g/dL — ABNORMAL LOW (ref 12.0–15.0)
Immature Granulocytes: 0 %
Lymphocytes Relative: 31 %
Lymphs Abs: 2.1 10*3/uL (ref 0.7–4.0)
MCH: 21 pg — ABNORMAL LOW (ref 26.0–34.0)
MCHC: 32.9 g/dL (ref 30.0–36.0)
MCV: 63.6 fL — ABNORMAL LOW (ref 80.0–100.0)
Monocytes Absolute: 0.8 10*3/uL (ref 0.1–1.0)
Monocytes Relative: 12 %
Neutro Abs: 3.8 10*3/uL (ref 1.7–7.7)
Neutrophils Relative %: 55 %
Platelets: 283 10*3/uL (ref 150–400)
RBC: 5.25 MIL/uL — ABNORMAL HIGH (ref 3.87–5.11)
RDW: 21.8 % — ABNORMAL HIGH (ref 11.5–15.5)
WBC: 6.9 10*3/uL (ref 4.0–10.5)
nRBC: 0 % (ref 0.0–0.2)

## 2023-09-05 LAB — COMPREHENSIVE METABOLIC PANEL
ALT: 10 U/L (ref 0–44)
AST: 18 U/L (ref 15–41)
Albumin: 3.5 g/dL (ref 3.5–5.0)
Alkaline Phosphatase: 62 U/L (ref 38–126)
Anion gap: 10 (ref 5–15)
BUN: 9 mg/dL (ref 6–20)
CO2: 25 mmol/L (ref 22–32)
Calcium: 9.1 mg/dL (ref 8.9–10.3)
Chloride: 102 mmol/L (ref 98–111)
Creatinine, Ser: 0.52 mg/dL (ref 0.44–1.00)
GFR, Estimated: >60 mL/min (ref >60)
Glucose, Bld: 102 mg/dL — ABNORMAL HIGH (ref 70–99)
Potassium: 3.8 mmol/L (ref 3.5–5.1)
Sodium: 137 mmol/L (ref 135–145)
Total Bilirubin: 0.8 mg/dL (ref 0.0–1.2)
Total Protein: 7.3 g/dL (ref 6.5–8.1)

## 2023-09-05 LAB — LIPASE, BLOOD: Lipase: 29 U/L (ref 11–51)

## 2023-09-05 LAB — TROPONIN I (HIGH SENSITIVITY): Troponin I (High Sensitivity): <2 ng/L (ref <18)

## 2023-09-05 MED ORDER — SODIUM CHLORIDE 0.9% FLUSH: 10.0000 mL | INTRAVENOUS | Status: DC | PRN

## 2023-09-05 MED ORDER — HEPARIN SOD (PORK) LOCK FLUSH 100 UNIT/ML IV SOLN
500.0000 [IU] | Freq: Once | INTRAVENOUS | Status: AC
  Administered 2023-09-05: 500 [IU]
  Filled 2023-09-05: qty 5

## 2023-09-05 MED ORDER — FENTANYL CITRATE PF 50 MCG/ML IJ SOSY
50.0000 ug | PREFILLED_SYRINGE | Freq: Once | INTRAMUSCULAR | Status: AC
  Administered 2023-09-05: 50 ug via INTRAVENOUS
  Filled 2023-09-05: qty 1

## 2023-09-05 MED ORDER — SODIUM CHLORIDE 0.9 % IV SOLN: INTRAVENOUS | Status: DC

## 2023-09-05 MED ORDER — SODIUM CHLORIDE 0.9% FLUSH
10.0000 mL | Freq: Once | INTRAVENOUS | Status: AC
  Administered 2023-09-05: 10 mL

## 2023-09-05 MED ORDER — HEPARIN SOD (PORK) LOCK FLUSH 100 UNIT/ML IV SOLN
500.0000 [IU] | Freq: Once | INTRAVENOUS | Status: AC
  Administered 2023-09-05: 500 [IU]

## 2023-09-05 MED ORDER — SODIUM CHLORIDE 0.9% FLUSH: 10.0000 mL | Freq: Two times a day (BID) | INTRAVENOUS | Status: DC

## 2023-09-05 MED ORDER — MORPHINE SULFATE (PF) 4 MG/ML IV SOLN
4.0000 mg | Freq: Once | INTRAVENOUS | Status: AC
  Administered 2023-09-05: 4 mg via INTRAVENOUS
  Filled 2023-09-05: qty 1

## 2023-09-05 MED ORDER — AMLODIPINE BESYLATE 10 MG PO TABS: 10.0000 mg | ORAL_TABLET | Freq: Every day | ORAL | 2 refills | Status: DC

## 2023-09-05 MED ORDER — AMLODIPINE BESYLATE 5 MG PO TABS
10.0000 mg | ORAL_TABLET | Freq: Once | ORAL | Status: AC
  Administered 2023-09-05: 10 mg via ORAL
  Filled 2023-09-05: qty 2

## 2023-09-05 MED ORDER — IOHEXOL 350 MG/ML SOLN
100.0000 mL | Freq: Once | INTRAVENOUS | Status: AC | PRN
  Administered 2023-09-05: 100 mL via INTRAVENOUS

## 2023-09-05 MED ORDER — HYDRALAZINE HCL 20 MG/ML IJ SOLN
5.0000 mg | Freq: Once | INTRAMUSCULAR | Status: AC
  Administered 2023-09-05: 5 mg via INTRAVENOUS
  Filled 2023-09-05: qty 1

## 2023-09-05 MED ORDER — CHLORHEXIDINE GLUCONATE CLOTH 2 % EX PADS: 6.0000 | MEDICATED_PAD | Freq: Every day | CUTANEOUS | Status: DC

## 2023-09-05 NOTE — ED Triage Notes (Signed)
Pt brought over from cancer center, treatment complete.  Onset of 10/10 chest pain.  Pt states began this morning but has worsened.

## 2023-09-05 NOTE — ED Provider Notes (Signed)
[place] [facility]MENT AT [facility] Provider Note   CSN: [id number] Arrival date & time: 09/05/23  1516     History  Chief Complaint  Patient presents with   Chest Pain    Carol Mack is a 53 y.o. female.  53 year old female with history of metastatic colon cancer presents with chest pain that began today.  Pain is localized to her upper gastric area and does not radiate to her back.  Has been constant in nature.  Not associate with dizziness or diaphoresis.  No nausea or vomiting.  She does have a history of hypertension but has been off her blood pressure medication for the past 5 months.  Went to cancer center today for treatment and told them that she had 1010 chest pain.  Denies any shortness of complaint today.  Was sent here for further evaluation.  Denies any prior history of coronary disease       Home Medications Prior to Admission medications   Medication Sig Start Date End Date Taking? Authorizing Provider  albuterol (VENTOLIN HFA) 108 (90 Base) MCG/ACT inhaler Inhale 1-2 puffs into the lungs every 6 (six) hours as needed for shortness of breath. 12/30/22   Milas Hock, MD  ferrous sulfate 325 (65 FE) MG tablet Take 1 tablet (325 mg total) by mouth daily with breakfast. 07/21/23   [name], MD  lidocaine-prilocaine (EMLA) cream Apply to affected area once 09/03/23   [name], MD  LORazepam (ATIVAN) 0.5 MG tablet Take 1 tablet (0.5 mg total) by mouth every 12 (twelve) hours as needed for anxiety. 08/20/23   Pollyann Samples, NP  mirtazapine (REMERON) 7.5 MG tablet Take 1 tablet (7.5 mg total) by mouth at bedtime. 08/06/23   [name], MD  ondansetron (ZOFRAN) 8 MG tablet Take 1 tablet (8 mg total) by mouth every 8 (eight) hours as needed for nausea or vomiting. Start on the third day after chemotherapy. 07/25/23   [name], MD  oxyCODONE (OXY IR/ROXICODONE) 5 MG immediate release tablet Take 1 tablet (5 mg total) by mouth every 4 (four) hours  as needed for severe pain (pain score 7-10). 09/03/23   [name], MD  polyethylene glycol (MIRALAX) 17 g packet Take 17 g by mouth 2 (two) times daily. 05/11/23   [name], MD  prochlorperazine (COMPAZINE) 10 MG tablet Take 1 tablet (10 mg total) by mouth every 6 (six) hours as needed for nausea or vomiting. 07/25/23   [name], MD  triamcinolone (NASACORT ALLERGY 24HR) 55 MCG/ACT AERO nasal inhaler Place 1 spray into the nose daily as needed (congestion).    [provider]      Allergies    Hydrocodone    Review of Systems   Review of Systems  All other systems reviewed and are negative.   Physical Exam Updated Vital Signs BP (!) 157/112 (BP Location: Left Arm)   Pulse 80   Temp 98.2 F (36.8 C) (Oral)   Resp (!) 24   LMP  (LMP Unknown)   SpO2 100%  Physical Exam Vitals and nursing note reviewed.  Constitutional:      General: She is not in acute distress.    Appearance: Normal appearance. She is well-developed. She is not toxic-appearing.  HENT:     Head: Normocephalic and atraumatic.  Eyes:     General: Lids are normal.     Conjunctiva/sclera: Conjunctivae normal.     Pupils: Pupils are equal, round, and reactive to light.  Neck:     Thyroid: No thyroid mass.     Trachea: No tracheal deviation.  Cardiovascular:     Rate and Rhythm: Normal rate and regular rhythm.     Heart sounds: Normal heart sounds. No murmur heard.    No gallop.  Pulmonary:     Effort: Pulmonary effort is normal. No respiratory distress.     Breath sounds: Normal breath sounds. No stridor. No decreased breath sounds, wheezing, rhonchi or rales.  Abdominal:     General: There is no distension.     Palpations: Abdomen is soft.     Tenderness: There is abdominal tenderness in the epigastric area. There is no rebound.    Musculoskeletal:        General: No tenderness. Normal range of motion.     Cervical back: Normal range of motion and neck supple.  Skin:    General: Skin is warm  and dry.     Findings: No abrasion or rash.  Neurological:     Mental Status: She is alert and oriented to person, place, and time. Mental status is at baseline.     GCS: GCS eye subscore is 4. GCS verbal subscore is 5. GCS motor subscore is 6.     Cranial Nerves: No cranial nerve deficit.     Sensory: No sensory deficit.     Motor: Motor function is intact.  Psychiatric:        Attention and Perception: Attention normal.        Speech: Speech normal.        Behavior: Behavior normal.     ED Results / Procedures / Treatments   Labs (all labs ordered are listed, but only abnormal results are displayed) Labs Reviewed  CBC WITH DIFFERENTIAL/PLATELET  COMPREHENSIVE METABOLIC PANEL  LIPASE, BLOOD  TROPONIN I (HIGH SENSITIVITY)    EKG EKG Interpretation Date/Time:  Friday September 05 2023 15:20:04 EST Ventricular Rate:  79 PR Interval:  156 QRS Duration:  86 QT Interval:  412 QTC Calculation: 473 R Axis:   -4  Text Interpretation: Sinus rhythm No significant change since last tracing Confirmed by [name] ([id number]) on 09/05/2023 3:35:41 PM  Radiology No results found.  Procedures Procedures    Medications Ordered in ED Medications  0.9 %  sodium chloride infusion (has no administration in time range)  amLODipine (NORVASC) tablet 10 mg (has no administration in time range)  hydrALAZINE (APRESOLINE) injection 5 mg (has no administration in time range)  morphine (PF) 4 MG/ML injection 4 mg (has no administration in time range)    ED Course/ Medical Decision Making/ A&P                                 Medical Decision Making Amount and/or Complexity of Data Reviewed Labs: ordered. Radiology: ordered.  Risk OTC drugs. Prescription drug management.   Patient is EKG shows normal sinus rhythm.  Chest x-ray from interpretation shows no acute findings.  Patient medicated for headache morphine dose.  He better.  Concern for possible ACS versus PE versus  intra-abdominal process.  CT abdomen and pelvis along with chest was negative for PE.  She does have a goiter seen on the CT scan which is known to her.  Troponin negative.  She is at pain for over 6 hours and therefore ACS ruled out.  Patient hypertensive here and given hydralazine along with a dose of amlodipine.  Patient  has been out of her amlodipine for over 5 months.  Blood pressure improved.  Will give patient prescription for same and discharge   CRITICAL CARE Performed by: Toy Baker Total critical care time: 55 minutes Critical care time was exclusive of separately billable procedures and treating other patients. Critical care was necessary to treat or prevent imminent or life-threatening deterioration. Critical care was time spent personally by me on the following activities: development of treatment plan with patient and/or surrogate as well as nursing, discussions with consultants, evaluation of patient's response to treatment, examination of patient, obtaining history from patient or surrogate, ordering and performing treatments and interventions, ordering and review of laboratory studies, ordering and review of radiographic studies, pulse oximetry and re-evaluation of patient's condition.        Final Clinical Impression(s) / ED Diagnoses Final diagnoses:  None    Rx / DC Orders ED Discharge Orders     None         [name], MD 09/05/23 1939

## 2023-09-05 NOTE — Progress Notes (Signed)
Pt arrived to have chemo pump discontinued.  Obtained a set of vital on pt. Blood Pressure out of normal limits. Notified the charge nurse and symptom management PA. Pt was hypertensive @ 115/113 RA and 148/110 LA.  Pt. Stated chest pain rated @ 10/10. Charge nurse escorted pt to the ED per the guidance of the PA.

## 2023-09-09 ENCOUNTER — Encounter: Payer: Self-pay | Admitting: Hematology

## 2023-09-09 ENCOUNTER — Inpatient Hospital Stay: Payer: Medicaid Other | Admitting: Licensed Clinical Social Worker

## 2023-09-09 DIAGNOSIS — C189 Malignant neoplasm of colon, unspecified: Secondary | ICD-10-CM

## 2023-09-09 NOTE — Progress Notes (Signed)
 Patient's daughter Sharen Hones called to re-discuss Marketing executive.  Advised what would be needed to apply and to bring at next visit on 09/17/23 to provide to registration to be scanned and emailed. Discussed expenses of concern and what would be needed to submit.  She has my card to call at earliest convenience and for any additional financial questions or concerns.

## 2023-09-09 NOTE — Progress Notes (Signed)
 [facility] CSW Progress Note  Visual merchandiser  received a call from pt's daughter inquiring about applying for the [employer].  Pt's daughter confirmed pt has been approved for food stamps.  Contact information given to pt's daughter for the patient financial resource specialist to start the application process.  CSW to remain available as appropriate to provide support throughout duration of treatment.        Rachel Moulds, LCSW Clinical Social Worker [facility]    Patient is participating in a Managed Medicaid Plan:  Yes

## 2023-09-15 NOTE — Assessment & Plan Note (Signed)
-  [id number] with mesentery node and possible liver metastasis, G2, MMR proficient, KRAS G12D mutation (+) -Patient initially resented with abdominal pain to the emergency room, underwent a colonoscopy in October 2024 which showed a large mass in the splenic lecture of left side colon. -She underwent left hemicolectomy on June 26, 2023.  Large mesenteric mass was also biopsied which confirmed metastatic adenocarcinoma. -Staging CT scan and a liver MRI showed a suspicious 0.8 cm mass in the left lobe, concerning for metastasis.  No other evidence of distant metastatic disease. Liver biopsy on 08/27/2023 was benign.  -I discussed her metastatic disease and the probable incurable nature of her disease. -I recommend systemic treatment with chemotherapy FOLFOX, she started on 08/06/23. -NGS [facility] showed KRAS G12D and PIK3CA mutations, not a candidate for targeted therapy  -Her tumor was tested of for MMR which were normal, she is not a candidate for immunotherapy.

## 2023-09-17 ENCOUNTER — Encounter: Payer: Self-pay | Admitting: Hematology

## 2023-09-17 ENCOUNTER — Inpatient Hospital Stay: Payer: Self-pay | Attending: Hematology

## 2023-09-17 ENCOUNTER — Inpatient Hospital Stay: Payer: Self-pay | Attending: Hematology | Admitting: Hematology

## 2023-09-17 ENCOUNTER — Inpatient Hospital Stay: Payer: Self-pay

## 2023-09-17 ENCOUNTER — Other Ambulatory Visit: Payer: Self-pay

## 2023-09-17 ENCOUNTER — Inpatient Hospital Stay: Attending: Hematology

## 2023-09-17 VITALS — BP 120/78 | HR 95 | Temp 97.4°F | Resp 17 | Wt 111.2 lb

## 2023-09-17 DIAGNOSIS — C189 Malignant neoplasm of colon, unspecified: Secondary | ICD-10-CM

## 2023-09-17 DIAGNOSIS — Z9079 Acquired absence of other genital organ(s): Secondary | ICD-10-CM | POA: Insufficient documentation

## 2023-09-17 DIAGNOSIS — Z95828 Presence of other vascular implants and grafts: Secondary | ICD-10-CM

## 2023-09-17 DIAGNOSIS — Z5112 Encounter for antineoplastic immunotherapy: Secondary | ICD-10-CM | POA: Insufficient documentation

## 2023-09-17 DIAGNOSIS — F419 Anxiety disorder, unspecified: Secondary | ICD-10-CM | POA: Diagnosis not present

## 2023-09-17 DIAGNOSIS — D649 Anemia, unspecified: Secondary | ICD-10-CM

## 2023-09-17 DIAGNOSIS — Z90710 Acquired absence of both cervix and uterus: Secondary | ICD-10-CM | POA: Diagnosis not present

## 2023-09-17 DIAGNOSIS — Z79899 Other long term (current) drug therapy: Secondary | ICD-10-CM | POA: Insufficient documentation

## 2023-09-17 DIAGNOSIS — C772 Secondary and unspecified malignant neoplasm of intra-abdominal lymph nodes: Secondary | ICD-10-CM | POA: Insufficient documentation

## 2023-09-17 DIAGNOSIS — G8929 Other chronic pain: Secondary | ICD-10-CM | POA: Diagnosis not present

## 2023-09-17 DIAGNOSIS — R079 Chest pain, unspecified: Secondary | ICD-10-CM | POA: Insufficient documentation

## 2023-09-17 DIAGNOSIS — Z5111 Encounter for antineoplastic chemotherapy: Secondary | ICD-10-CM | POA: Insufficient documentation

## 2023-09-17 LAB — CBC WITH DIFFERENTIAL (CANCER CENTER ONLY)
Abs Immature Granulocytes: 0 10*3/uL (ref 0.00–0.07)
Basophils Absolute: 0 10*3/uL (ref 0.0–0.1)
Basophils Relative: 1 %
Eosinophils Absolute: 0.1 10*3/uL (ref 0.0–0.5)
Eosinophils Relative: 2 %
HCT: 35.2 % — ABNORMAL LOW (ref 36.0–46.0)
Hemoglobin: 11.7 g/dL — ABNORMAL LOW (ref 12.0–15.0)
Immature Granulocytes: 0 %
Lymphocytes Relative: 55 %
Lymphs Abs: 2.4 10*3/uL (ref 0.7–4.0)
MCH: 21.6 pg — ABNORMAL LOW (ref 26.0–34.0)
MCHC: 33.2 g/dL (ref 30.0–36.0)
MCV: 64.9 fL — ABNORMAL LOW (ref 80.0–100.0)
Monocytes Absolute: 0.7 10*3/uL (ref 0.1–1.0)
Monocytes Relative: 17 %
Neutro Abs: 1.1 10*3/uL — ABNORMAL LOW (ref 1.7–7.7)
Neutrophils Relative %: 25 %
Platelet Count: 241 10*3/uL (ref 150–400)
RBC: 5.42 MIL/uL — ABNORMAL HIGH (ref 3.87–5.11)
RDW: 23.5 % — ABNORMAL HIGH (ref 11.5–15.5)
WBC Count: 4.3 10*3/uL (ref 4.0–10.5)
nRBC: 0 % (ref 0.0–0.2)

## 2023-09-17 LAB — URINALYSIS, COMPLETE (UACMP) WITH MICROSCOPIC
Bacteria, UA: NONE SEEN
Bilirubin Urine: NEGATIVE
Glucose, UA: NEGATIVE mg/dL
Hgb urine dipstick: NEGATIVE
Ketones, ur: NEGATIVE mg/dL
Leukocytes,Ua: NEGATIVE
Nitrite: NEGATIVE
Protein, ur: NEGATIVE mg/dL
Specific Gravity, Urine: 1.002 — ABNORMAL LOW (ref 1.005–1.030)
pH: 6 (ref 5.0–8.0)

## 2023-09-17 LAB — CMP (CANCER CENTER ONLY)
ALT: 7 U/L (ref 0–44)
AST: 15 U/L (ref 15–41)
Albumin: 4 g/dL (ref 3.5–5.0)
Alkaline Phosphatase: 72 U/L (ref 38–126)
Anion gap: 7 (ref 5–15)
BUN: 9 mg/dL (ref 6–20)
CO2: 26 mmol/L (ref 22–32)
Calcium: 9.3 mg/dL (ref 8.9–10.3)
Chloride: 106 mmol/L (ref 98–111)
Creatinine: 0.57 mg/dL (ref 0.44–1.00)
GFR, Estimated: >60 mL/min (ref >60)
Glucose, Bld: 91 mg/dL (ref 70–99)
Potassium: 3.9 mmol/L (ref 3.5–5.1)
Sodium: 139 mmol/L (ref 135–145)
Total Bilirubin: 0.5 mg/dL (ref 0.0–1.2)
Total Protein: 7.8 g/dL (ref 6.5–8.1)

## 2023-09-17 LAB — FERRITIN: Ferritin: 27 ng/mL (ref 11–307)

## 2023-09-17 MED ORDER — SODIUM CHLORIDE 0.9 % IV SOLN: INTRAVENOUS | Status: DC

## 2023-09-17 MED ORDER — ACETAMINOPHEN 325 MG PO TABS
650.0000 mg | ORAL_TABLET | Freq: Once | ORAL | Status: AC
  Administered 2023-09-17: 650 mg via ORAL

## 2023-09-17 MED ORDER — PALONOSETRON HCL INJECTION 0.25 MG/5ML
0.2500 mg | Freq: Once | INTRAVENOUS | Status: AC
  Administered 2023-09-17: 0.25 mg via INTRAVENOUS
  Filled 2023-09-17: qty 5

## 2023-09-17 MED ORDER — SODIUM CHLORIDE 0.9% FLUSH
10.0000 mL | Freq: Once | INTRAVENOUS | Status: AC
  Administered 2023-09-17: 10 mL

## 2023-09-17 MED ORDER — SODIUM CHLORIDE 0.9 % IV SOLN
2400.0000 mg/m2 | INTRAVENOUS | Status: DC
  Administered 2023-09-17: 3500 mg via INTRAVENOUS
  Filled 2023-09-17: qty 70

## 2023-09-17 MED ORDER — LORAZEPAM 1 MG PO TABS
0.5000 mg | ORAL_TABLET | Freq: Once | ORAL | Status: AC
  Administered 2023-09-17: 0.5 mg via ORAL
  Filled 2023-09-17: qty 1

## 2023-09-17 MED ORDER — OXYCODONE HCL 5 MG PO TABS: 5.0000 mg | ORAL_TABLET | ORAL | 0 refills | Status: DC | PRN

## 2023-09-17 MED ORDER — OXYCODONE HCL 5 MG PO TABS: 5.0000 mg | ORAL_TABLET | Freq: Once | ORAL | Status: DC

## 2023-09-17 MED ORDER — LORAZEPAM 0.5 MG PO TABS: 0.5000 mg | ORAL_TABLET | Freq: Two times a day (BID) | ORAL | 0 refills | Status: DC | PRN

## 2023-09-17 MED ORDER — FLUOROURACIL CHEMO INJECTION 2.5 GM/50ML
400.0000 mg/m2 | Freq: Once | INTRAVENOUS | Status: AC
  Administered 2023-09-17: 600 mg via INTRAVENOUS
  Filled 2023-09-17: qty 12

## 2023-09-17 MED ORDER — BEVACIZUMAB-ADCD CHEMO INJECTION 400 MG/16ML
5.0000 mg/kg | Freq: Once | INTRAVENOUS | Status: AC
  Administered 2023-09-17: 250 mg via INTRAVENOUS
  Filled 2023-09-17: qty 10

## 2023-09-17 MED ORDER — OXALIPLATIN CHEMO INJECTION 100 MG/20ML
85.0000 mg/m2 | Freq: Once | INTRAVENOUS | Status: AC
  Administered 2023-09-17: 130 mg via INTRAVENOUS
  Filled 2023-09-17: qty 26

## 2023-09-17 MED ORDER — DEXTROSE 5 % IV SOLN: INTRAVENOUS | Status: DC

## 2023-09-17 MED ORDER — DEXAMETHASONE SODIUM PHOSPHATE 10 MG/ML IJ SOLN
10.0000 mg | Freq: Once | INTRAMUSCULAR | Status: AC
  Administered 2023-09-17: 10 mg via INTRAVENOUS
  Filled 2023-09-17: qty 1

## 2023-09-17 MED ORDER — DEXTROSE 5 % IV SOLN
400.0000 mg/m2 | Freq: Once | INTRAVENOUS | Status: AC
  Administered 2023-09-17: 600 mg via INTRAVENOUS
  Filled 2023-09-17: qty 30

## 2023-09-17 MED ORDER — ALTEPLASE 2 MG IJ SOLR
2.0000 mg | Freq: Once | INTRAMUSCULAR | Status: AC
Start: 2023-09-17 — End: 2023-09-17
  Administered 2023-09-17: 2 mg
  Filled 2023-09-17: qty 2

## 2023-09-17 MED ORDER — OXYCODONE HCL 5 MG PO TABS
5.0000 mg | ORAL_TABLET | Freq: Once | ORAL | Status: AC
  Administered 2023-09-17: 5 mg via ORAL
  Filled 2023-09-17: qty 1

## 2023-09-17 NOTE — Patient Instructions (Signed)
 [facility] - A DEPT OF [facility]  Discharge Instructions: Thank you for choosing [facility] to provide your oncology and hematology care.   If you have a lab appointment with the Cancer Center, please go directly to the Cancer Center and check in at the registration area.   Wear comfortable clothing and clothing appropriate for easy access to any Portacath or PICC line.   We strive to give you quality time with your provider. You may need to reschedule your appointment if you arrive late (15 or more minutes).  Arriving late affects you and other patients whose appointments are after yours.  Also, if you miss three or more appointments without notifying the office, you may be dismissed from the clinic at the provider's discretion.      For prescription refill requests, have your pharmacy contact our office and allow 72 hours for refills to be completed.    Today you received the following chemotherapy and/or immunotherapy agents: bevacizumab, oxaliplatin, leucovorin, fluorouracil       To help prevent nausea and vomiting after your treatment, we encourage you to take your nausea medication as directed.  BELOW ARE SYMPTOMS THAT SHOULD BE REPORTED IMMEDIATELY: *FEVER GREATER THAN 100.4 F (38 C) OR HIGHER *CHILLS OR SWEATING *NAUSEA AND VOMITING THAT IS NOT CONTROLLED WITH YOUR NAUSEA MEDICATION *UNUSUAL SHORTNESS OF BREATH *UNUSUAL BRUISING OR BLEEDING *URINARY PROBLEMS (pain or burning when urinating, or frequent urination) *BOWEL PROBLEMS (unusual diarrhea, constipation, pain near the anus) TENDERNESS IN MOUTH AND THROAT WITH OR WITHOUT PRESENCE OF ULCERS (sore throat, sores in mouth, or a toothache) UNUSUAL RASH, SWELLING OR PAIN  UNUSUAL VAGINAL DISCHARGE OR ITCHING   Items with * indicate a potential emergency and should be followed up as soon as possible or go to the Emergency Department if any problems should occur.  Please show the  CHEMOTHERAPY ALERT CARD or IMMUNOTHERAPY ALERT CARD at check-in to the Emergency Department and triage nurse.  Should you have questions after your visit or need to cancel or reschedule your appointment, please contact [facility] - A DEPT OF [facility]  Dept: [phone]  and follow the prompts.  Office hours are 8:00 a.m. to 4:30 p.m. Monday - Friday. Please note that voicemails left after 4:00 p.m. may not be returned until the following business day.  We are closed weekends and major holidays. You have access to a nurse at all times for urgent questions. Please call the main number to the clinic Dept: [phone] and follow the prompts.   For any non-urgent questions, you may also contact your provider using MyChart. We now offer e-Visits for anyone 56 and older to request care online for non-urgent symptoms. For details visit [email or web address].   Also download the MyChart app! Go to the app store, search "MyChart", open the app, select [place], and log in with your MyChart username and password.

## 2023-09-17 NOTE — Progress Notes (Signed)
 [facility]   [phone] Fax:([phone]   Clinic Follow up Note   Patient Care Team: [name], NP as PCP - General (Internal Medicine) [name], MD as Consulting Physician (Hematology and Oncology) [name], MD as Consulting Physician (General Surgery) [name], MD as Consulting Physician (Gastroenterology)  Date of Service:  09/17/2023  CHIEF COMPLAINT: f/u of metastatic colon cancer  CURRENT THERAPY:  First-line chemotherapy FOLFOX and bevacizumab  Oncology History   Colon cancer metastasized to mesenteric lymph nodes (HCC) -[id number] with mesentery node and possible liver metastasis, G2, MMR proficient, KRAS G12D mutation (+) -Patient initially resented with abdominal pain to the emergency room, underwent a colonoscopy in October 2024 which showed a large mass in the splenic lecture of left side colon. -She underwent left hemicolectomy on June 26, 2023.  Large mesenteric mass was also biopsied which confirmed metastatic adenocarcinoma. -Staging CT scan and a liver MRI showed a suspicious 0.8 cm mass in the left lobe, concerning for metastasis.  No other evidence of distant metastatic disease. Liver biopsy on 08/27/2023 was benign.  -I discussed her metastatic disease and the probable incurable nature of her disease. -I recommend systemic treatment with chemotherapy FOLFOX, she started on 08/06/23. -NGS [facility] showed KRAS G12D and PIK3CA mutations, not a candidate for targeted therapy  -Her tumor was tested of for MMR which were normal, she is not a candidate for immunotherapy.    Assessment and Plan    Metastatic colon cancer Undergoing chemotherapy for metastatic colon cancer with primary tumor in the abdomen and pelvis. Tolerating chemotherapy well without significant side effects such as nausea or neuropathy. Reports weight loss but maintains adequate nutrition despite appetite fluctuations. Anxious about cancer  progression and inquires about alternative treatments and follow-up imaging timing. Genetic testing revealed multiple mutations, but no current treatment options based on these findings. Future clinical trials may be considered. - Continue chemotherapy as scheduled with the next cycle on April 2nd. - Order CT scan of the abdomen and pelvis post-April 2nd treatment to assess cancer status. - Encourage nutritional support and consider dietician referral if needed. - Discuss potential future clinical trial options based on genetic mutations identified in the tumor.  Chronic chest pain Persistent chest pain rated as 10/10, which I do not think to be related to metastatic colon cancer. Differential diagnosis includes gastroesophageal reflux disease, anxiety-related pain, or chronic lung disease. Recent CT scan showed diffuse chronic cystic lung disease and pulmonary artery hypertension, but no acute findings to explain pain severity. Pain affects sleep and daily activities. - Refer to palliative care nurse practitioner for pain and symptom management. - Prescribe oxycodone for pain management.  I encouraged her to use as little as possible, due to her high risk of dependence.  - Prescribe lorazepam for anxiety management. - Discuss potential role of anxiety in exacerbating chest pain.  Pulmonary artery hypertension and chronic cystic lung disease CT scan revealed significant diffuse chronic cystic lung disease and pulmonary artery hypertension, likely related to past smoking history. These findings are chronic and may contribute to chest pain and overall respiratory status. - Monitor pulmonary status and consider pulmonologist referral if symptoms worsen.  Anxiety Exhibits significant anxiety, particularly related to cancer diagnosis and treatment. Currently taking lorazepam for anxiety management. Anxiety may contribute to perception of pain and overall distress. Declined counseling services,  expressing frustration with previous attempts to engage with support services. - Continue lorazepam for anxiety management. - Provide reassurance and support  regarding cancer treatment and prognosis.     Plan -Lab reviewed, adequate for treatment, will proceed to chemotherapy today and continue every 2 weeks -I refilled her oxycodone and lorazepam for next 2 weeks.  She requested 1 dose oxycodone in the infusion room due to her chest pain, I told her to bring her on oxycodone in the future, and I would not prescribe more oxycodone for her in the infusion room in future. -Refer her to palliative care for pain management, she is at very high risk for narcotics dependence  -Social work referral for her anxiety counseling -Follow-up in 2 weeks    SUMMARY OF ONCOLOGIC HISTORY: Oncology History  Colon cancer metastasized to mesenteric lymph nodes (HCC)  06/26/2023 Cancer Staging   Staging form: Colon and Rectum, AJCC 8th Edition - Pathologic stage from 06/26/2023: Stage IVC (pT4a, pN1b, pM1c) - Signed by [name], MD on 07/25/2023 Residual tumor (R): R1 - Microscopic   07/25/2023 Initial Diagnosis   Colon cancer metastasized to mesenteric lymph nodes (HCC)   08/06/2023 -  Chemotherapy   Patient is on Treatment Plan : COLORECTAL FOLFOX + Bevacizumab q14d        Discussed the use of AI scribe software for clinical note transcription with the patient, who gave verbal consent to proceed.  History of Present Illness   The patient, a 53 year old with a history of metastatic colon cancer, presents for a follow-up visit. She reports experiencing chest pain, describing it as a sensation of "something hurting" in the sternum area. The pain is persistent, causing discomfort and sleep disturbances. The patient also mentions anxiety and requests a refill of lorazepam, which she takes twice daily. She also requests a refill of oxycodone for pain management, which she takes at night to alleviate discomfort  and aid sleep. The patient also reports using marijuana occasionally to stimulate appetite. She expresses concern about weight loss, having lost three pounds recently. Despite these concerns, the patient reports tolerating chemotherapy well, with no significant side effects.         All other systems were reviewed with the patient and are negative.  MEDICAL HISTORY:  Past Medical History:  Diagnosis Date   Anemia    Asthma    Bartholin cyst    Chiari malformation type I (HCC)    Hernia, inguinal    Hernia, inguinal    Hypertension    ILD (interstitial lung disease) (HCC)     SURGICAL HISTORY: Past Surgical History:  Procedure Laterality Date   fibroid freezing     unsure   IR GENERIC HISTORICAL  07/27/2014   IR RADIOLOGIST EVAL & MGMT 07/27/2014 [name], MD [facility] INTERV RAD   PORTACATH PLACEMENT N/A 07/31/2023   Procedure: PLACEMENT OF PORT A CATHETER UNDER ULTRASOUND AND FLUOROSCOPIC GUIDANCE;  Surgeon: Karie Soda, MD;  Location: [facility];  Service: General;  Laterality: N/A;  GEN WITH ERAS 1.5 HOURS TOTAL   ROBOTIC ASSISTED TOTAL HYSTERECTOMY N/A 09/15/2018   Procedure: XI ROBOTIC ASSISTED TOTAL HYSTERECTOMY WITH SALPINGECTOMY;  Surgeon: Willodean Rosenthal, MD;  Location: [facility];  Service: Gynecology;  Laterality: N/A;    I have reviewed the social history and family history with the patient and they are unchanged from previous note.  ALLERGIES:  is allergic to hydrocodone.  MEDICATIONS:  Current Outpatient Medications  Medication Sig Dispense Refill   albuterol (VENTOLIN HFA) 108 (90 Base) MCG/ACT inhaler Inhale 1-2 puffs into the lungs every 6 (six) hours as needed for shortness of breath.  2 each 1   amLODipine (NORVASC) 10 MG tablet Take 1 tablet (10 mg total) by mouth daily. 30 tablet 2   ferrous sulfate 325 (65 FE) MG tablet Take 1 tablet (325 mg total) by mouth daily with breakfast. 90 tablet 0   lidocaine-prilocaine (EMLA) cream Apply to  affected area once (Patient taking differently: Apply 1 Application topically as needed (for port access).) 30 g 3   mirtazapine (REMERON) 7.5 MG tablet Take 1 tablet (7.5 mg total) by mouth at bedtime. 30 tablet 0   ondansetron (ZOFRAN) 8 MG tablet Take 1 tablet (8 mg total) by mouth every 8 (eight) hours as needed for nausea or vomiting. Start on the third day after chemotherapy. 30 tablet 1   polyethylene glycol (MIRALAX) 17 g packet Take 17 g by mouth 2 (two) times daily. 30 each 0   prochlorperazine (COMPAZINE) 10 MG tablet Take 1 tablet (10 mg total) by mouth every 6 (six) hours as needed for nausea or vomiting. 30 tablet 1   LORazepam (ATIVAN) 0.5 MG tablet Take 1 tablet (0.5 mg total) by mouth every 12 (twelve) hours as needed for anxiety. 30 tablet 0   oxyCODONE (OXY IR/ROXICODONE) 5 MG immediate release tablet Take 1 tablet (5 mg total) by mouth every 4 (four) hours as needed for severe pain (pain score 7-10). 20 tablet 0   No current facility-administered medications for this visit.   Facility-Administered Medications Ordered in Other Visits  Medication Dose Route Frequency Provider Last Rate Last Admin   0.9 %  sodium chloride infusion   Intravenous Continuous [name], MD   Stopped at 09/17/23 1330   dextrose 5 % solution   Intravenous Continuous [name], MD   Stopped at 09/17/23 1605   fluorouracil (ADRUCIL) 3,500 mg in sodium chloride 0.9 % 80 mL chemo infusion  2,400 mg/m2 (Treatment Plan Recorded) Intravenous 1 day or 1 dose [name], MD   Infusion Verify at 09/17/23 1622    PHYSICAL EXAMINATION: ECOG PERFORMANCE STATUS: 1 - Symptomatic but completely ambulatory  Vitals:   09/17/23 1144  BP: 120/78  Pulse: 95  Resp: 17  Temp: (!) 97.4 F (36.3 C)  SpO2: 98%   Wt Readings from Last 3 Encounters:  09/17/23 111 lb 3.2 oz (50.4 kg)  09/03/23 114 lb 4.8 oz (51.8 kg)  08/27/23 114 lb (51.7 kg)     GENERAL:alert, no distress and comfortable SKIN: skin color, texture,  turgor are normal, no rashes or significant lesions EYES: normal, Conjunctiva are pink and non-injected, sclera clear NECK: supple, thyroid normal size, non-tender, without nodularity LYMPH:  no palpable lymphadenopathy in the cervical, axillary  LUNGS: clear to auscultation and percussion with normal breathing effort HEART: regular rate & rhythm and no murmurs and no lower extremity edema ABDOMEN:abdomen soft, non-tender and normal bowel sounds Musculoskeletal:no cyanosis of digits and no clubbing  NEURO: alert & oriented x 3 with fluent speech, no focal motor/sensory deficits   LABORATORY DATA:  I have reviewed the data as listed    Latest Ref Rng & Units 09/17/2023   11:23 AM 09/05/2023    5:18 PM 09/03/2023   11:16 AM  CBC  WBC 4.0 - 10.5 K/uL 4.3  6.9  5.3   Hemoglobin 12.0 - 15.0 g/dL 16.1  09.6  04.5   Hematocrit 36.0 - 46.0 % 35.2  33.4  30.0   Platelets 150 - 400 K/uL 241  283  282         Latest  Ref Rng & Units 09/17/2023   11:23 AM 09/05/2023    5:18 PM 09/03/2023   11:16 AM  CMP  Glucose 70 - 99 mg/dL 91  161  096   BUN 6 - 20 mg/dL 9  9  9    Creatinine 0.44 - 1.00 mg/dL 0.45  4.09  8.11   Sodium 135 - 145 mmol/L 139  137  139   Potassium 3.5 - 5.1 mmol/L 3.9  3.8  3.5   Chloride 98 - 111 mmol/L 106  102  107   CO2 22 - 32 mmol/L 26  25  25    Calcium 8.9 - 10.3 mg/dL 9.3  9.1  8.7   Total Protein 6.5 - 8.1 g/dL 7.8  7.3  6.3   Total Bilirubin 0.0 - 1.2 mg/dL 0.5  0.8  0.2   Alkaline Phos 38 - 126 U/L 72  62  65   AST 15 - 41 U/L 15  18  12    ALT 0 - 44 U/L 7  10  6        RADIOGRAPHIC STUDIES: I have personally reviewed the radiological images as listed and agreed with the findings in the report. No results found.    Orders Placed This Encounter  Procedures   CT ABDOMEN PELVIS W CONTRAST    Standing Status:   Future    Expected Date:   10/22/2023    Expiration Date:   09/16/2024    If indicated for the ordered procedure, I authorize the administration of  contrast media per Radiology protocol:   Yes    Does the patient have a contrast media/X-ray dye allergy?:   No    Is patient pregnant?:   No    Preferred imaging location?:   [facility]    If indicated for the ordered procedure, I authorize the administration of oral contrast media per Radiology protocol:   Yes   CBC with Differential (Cancer Center Only)    Standing Status:   Future    Expected Date:   10/29/2023    Expiration Date:   10/28/2024   CMP (Cancer Center only)    Standing Status:   Future    Expected Date:   10/29/2023    Expiration Date:   10/28/2024   Total Protein, Urine dipstick    Standing Status:   Future    Expected Date:   10/29/2023    Expiration Date:   10/28/2024   CBC with Differential (Cancer Center Only)    Standing Status:   Future    Expected Date:   11/12/2023    Expiration Date:   11/11/2024   CMP (Cancer Center only)    Standing Status:   Future    Expected Date:   11/12/2023    Expiration Date:   11/11/2024   Total Protein, Urine dipstick    Standing Status:   Future    Expected Date:   11/12/2023    Expiration Date:   11/11/2024   CBC with Differential (Cancer Center Only)    Standing Status:   Future    Expected Date:   11/26/2023    Expiration Date:   11/25/2024   CMP (Cancer Center only)    Standing Status:   Future    Expected Date:   11/26/2023    Expiration Date:   11/25/2024   Total Protein, Urine dipstick    Standing Status:   Future    Expected Date:   11/26/2023  Expiration Date:   11/25/2024   Amb Referral to Palliative Care    Referral Priority:   Routine    Referral Type:   Consultation    Number of Visits Requested:   1   All questions were answered. The patient knows to call the clinic with any problems, questions or concerns. No barriers to learning was detected. The total time spent in the appointment was 40 minutes.     [name], MD 09/17/2023

## 2023-09-18 ENCOUNTER — Other Ambulatory Visit: Payer: Self-pay

## 2023-09-18 LAB — URINE CULTURE: Culture: NO GROWTH

## 2023-09-19 ENCOUNTER — Emergency Department (HOSPITAL_COMMUNITY)

## 2023-09-19 ENCOUNTER — Inpatient Hospital Stay: Payer: Self-pay

## 2023-09-19 ENCOUNTER — Emergency Department (HOSPITAL_COMMUNITY)
Admission: EM | Admit: 2023-09-19 | Discharge: 2023-09-19 | Disposition: A | Discharge status: Home / Self Care | Attending: Emergency Medicine | Admitting: Emergency Medicine

## 2023-09-19 ENCOUNTER — Other Ambulatory Visit: Payer: Self-pay

## 2023-09-19 ENCOUNTER — Encounter: Payer: Self-pay | Admitting: Hematology

## 2023-09-19 ENCOUNTER — Encounter (HOSPITAL_COMMUNITY): Payer: Self-pay

## 2023-09-19 VITALS — BP 127/86 | HR 84 | Resp 16

## 2023-09-19 DIAGNOSIS — F17210 Nicotine dependence, cigarettes, uncomplicated: Secondary | ICD-10-CM | POA: Insufficient documentation

## 2023-09-19 DIAGNOSIS — Z5112 Encounter for antineoplastic immunotherapy: Secondary | ICD-10-CM | POA: Diagnosis not present

## 2023-09-19 DIAGNOSIS — Z85038 Personal history of other malignant neoplasm of large intestine: Secondary | ICD-10-CM | POA: Insufficient documentation

## 2023-09-19 DIAGNOSIS — I10 Essential (primary) hypertension: Secondary | ICD-10-CM | POA: Insufficient documentation

## 2023-09-19 DIAGNOSIS — Z95828 Presence of other vascular implants and grafts: Secondary | ICD-10-CM

## 2023-09-19 DIAGNOSIS — R079 Chest pain, unspecified: Secondary | ICD-10-CM | POA: Insufficient documentation

## 2023-09-19 DIAGNOSIS — Z79899 Other long term (current) drug therapy: Secondary | ICD-10-CM | POA: Diagnosis not present

## 2023-09-19 LAB — CBC
HCT: 37.5 % (ref 36.0–46.0)
Hemoglobin: 12.3 g/dL (ref 12.0–15.0)
MCH: 21.7 pg — ABNORMAL LOW (ref 26.0–34.0)
MCHC: 32.8 g/dL (ref 30.0–36.0)
MCV: 66.1 fL — ABNORMAL LOW (ref 80.0–100.0)
Platelets: 233 10*3/uL (ref 150–400)
RBC: 5.67 MIL/uL — ABNORMAL HIGH (ref 3.87–5.11)
RDW: 23.6 % — ABNORMAL HIGH (ref 11.5–15.5)
WBC: 9.4 10*3/uL (ref 4.0–10.5)
nRBC: 0 % (ref 0.0–0.2)

## 2023-09-19 LAB — TROPONIN I (HIGH SENSITIVITY)
Troponin I (High Sensitivity): <2 ng/L (ref <18)
Troponin I (High Sensitivity): <2 ng/L (ref <18)

## 2023-09-19 LAB — D-DIMER, QUANTITATIVE: D-Dimer, Quant: 1.92 ug{FEU}/mL — ABNORMAL HIGH (ref 0.00–0.50)

## 2023-09-19 LAB — BASIC METABOLIC PANEL
Anion gap: 12 (ref 5–15)
BUN: 10 mg/dL (ref 6–20)
CO2: 21 mmol/L — ABNORMAL LOW (ref 22–32)
Calcium: 9.2 mg/dL (ref 8.9–10.3)
Chloride: 106 mmol/L (ref 98–111)
Creatinine, Ser: 0.49 mg/dL (ref 0.44–1.00)
GFR, Estimated: >60 mL/min (ref >60)
Glucose, Bld: 99 mg/dL (ref 70–99)
Potassium: 3.5 mmol/L (ref 3.5–5.1)
Sodium: 139 mmol/L (ref 135–145)

## 2023-09-19 MED ORDER — IOHEXOL 350 MG/ML SOLN
75.0000 mL | Freq: Once | INTRAVENOUS | Status: AC | PRN
  Administered 2023-09-19: 75 mL via INTRAVENOUS

## 2023-09-19 MED ORDER — KETOROLAC TROMETHAMINE 15 MG/ML IJ SOLN
15.0000 mg | Freq: Once | INTRAMUSCULAR | Status: AC
  Administered 2023-09-19: 15 mg via INTRAVENOUS
  Filled 2023-09-19: qty 1

## 2023-09-19 MED ORDER — HYDROMORPHONE HCL 1 MG/ML IJ SOLN
0.5000 mg | Freq: Once | INTRAMUSCULAR | Status: AC
  Administered 2023-09-19: 0.5 mg via INTRAVENOUS
  Filled 2023-09-19: qty 1

## 2023-09-19 MED ORDER — ONDANSETRON HCL 4 MG/2ML IJ SOLN
4.0000 mg | Freq: Once | INTRAMUSCULAR | Status: AC
  Administered 2023-09-19: 4 mg via INTRAVENOUS
  Filled 2023-09-19: qty 2

## 2023-09-19 MED ORDER — HEPARIN SOD (PORK) LOCK FLUSH 100 UNIT/ML IV SOLN
500.0000 [IU] | Freq: Once | INTRAVENOUS | Status: AC
  Administered 2023-09-19: 500 [IU]

## 2023-09-19 MED ORDER — HYDROMORPHONE HCL 1 MG/ML IJ SOLN
1.0000 mg | Freq: Once | INTRAMUSCULAR | Status: AC
  Administered 2023-09-19: 1 mg via INTRAVENOUS
  Filled 2023-09-19: qty 1

## 2023-09-19 MED ORDER — SODIUM CHLORIDE 0.9% FLUSH
10.0000 mL | Freq: Once | INTRAVENOUS | Status: AC
  Administered 2023-09-19: 10 mL

## 2023-09-19 NOTE — ED Provider Notes (Signed)
 [name] Provider Note   CSN: [id number] Arrival date & time: 09/19/23  0550     History  Chief Complaint  Patient presents with   Chest Pain    Carol Mack is a 53 y.o. female.   Chest Pain    Patient has a history of hypertension interstitial lung disease, chronic cigarette use, metastatic colon cancer who presents to the ED for chest pain.  Patient started having pain in her chest last night.  Patient states the pain is persistent.  Mostly to the right side of midline.  Nothing is helping.  The pain is 10 out of 10.  She does not have any shortness of breath.  No leg swelling.  Patient did have similar episode of pain back on February 21.  She was seen in the ED at that time.  Patient had laboratory tests as well as a CT scan of her chest abdomen pelvis.  No evidence of pulmonary embolism was noted.  Patient had stable chronic cystic lung disease.  She was also noted to have an asymmetric multinodular goiter no acute findings were noted in the abdomen  Home Medications Prior to Admission medications   Medication Sig Start Date End Date Taking? Authorizing Provider  albuterol (VENTOLIN HFA) 108 (90 Base) MCG/ACT inhaler Inhale 1-2 puffs into the lungs every 6 (six) hours as needed for shortness of breath. 12/30/22   Milas Hock, MD  amLODipine (NORVASC) 10 MG tablet Take 1 tablet (10 mg total) by mouth daily. 09/05/23   [name], MD  ferrous sulfate 325 (65 FE) MG tablet Take 1 tablet (325 mg total) by mouth daily with breakfast. 07/21/23   [name], MD  lidocaine-prilocaine (EMLA) cream Apply to affected area once Patient taking differently: Apply 1 Application topically as needed (for port access). 09/03/23   [name], MD  LORazepam (ATIVAN) 0.5 MG tablet Take 1 tablet (0.5 mg total) by mouth every 12 (twelve) hours as needed for anxiety. 09/17/23   [name], MD  mirtazapine (REMERON) 7.5 MG tablet Take 1 tablet (7.5  mg total) by mouth at bedtime. 08/06/23   [name], MD  ondansetron (ZOFRAN) 8 MG tablet Take 1 tablet (8 mg total) by mouth every 8 (eight) hours as needed for nausea or vomiting. Start on the third day after chemotherapy. 07/25/23   [name], MD  oxyCODONE (OXY IR/ROXICODONE) 5 MG immediate release tablet Take 1 tablet (5 mg total) by mouth every 4 (four) hours as needed for severe pain (pain score 7-10). 09/17/23   [name], MD  polyethylene glycol (MIRALAX) 17 g packet Take 17 g by mouth 2 (two) times daily. 05/11/23   [name], MD  prochlorperazine (COMPAZINE) 10 MG tablet Take 1 tablet (10 mg total) by mouth every 6 (six) hours as needed for nausea or vomiting. 07/25/23   [name], MD      Allergies    Hydrocodone    Review of Systems   Review of Systems  Cardiovascular:  Positive for chest pain.    Physical Exam Updated Vital Signs BP 121/89   Pulse 87   Temp 98.1 F (36.7 C)   Resp 17   LMP  (LMP Unknown)   SpO2 93%  Physical Exam Vitals and nursing note reviewed.  Constitutional:      Appearance: She is well-developed. She is ill-appearing.     Comments: Appears to be in pain  HENT:     Head:  Normocephalic and atraumatic.     Right Ear: External ear normal.     Left Ear: External ear normal.  Eyes:     General: No scleral icterus.       Right eye: No discharge.        Left eye: No discharge.     Conjunctiva/sclera: Conjunctivae normal.  Neck:     Trachea: No tracheal deviation.  Cardiovascular:     Rate and Rhythm: Normal rate and regular rhythm.  Pulmonary:     Effort: Pulmonary effort is normal. No respiratory distress.     Breath sounds: Normal breath sounds. No stridor. No wheezing or rales.  Chest:     Chest wall: Tenderness present.  Abdominal:     General: Bowel sounds are normal. There is no distension.     Palpations: Abdomen is soft.     Tenderness: There is no abdominal tenderness. There is no guarding or rebound.  Musculoskeletal:         General: No tenderness or deformity.     Cervical back: Neck supple.  Skin:    General: Skin is warm and dry.     Findings: No rash.  Neurological:     General: No focal deficit present.     Mental Status: She is alert.     Cranial Nerves: No cranial nerve deficit, dysarthria or facial asymmetry.     Sensory: No sensory deficit.     Motor: No abnormal muscle tone or seizure activity.     Coordination: Coordination normal.  Psychiatric:        Mood and Affect: Mood normal.     ED Results / Procedures / Treatments   Labs (all labs ordered are listed, but only abnormal results are displayed) Labs Reviewed  BASIC METABOLIC PANEL - Abnormal; Notable for the following components:      Result Value   CO2 21 (*)    All other components within normal limits  CBC - Abnormal; Notable for the following components:   RBC 5.67 (*)    MCV 66.1 (*)    MCH 21.7 (*)    RDW 23.6 (*)    All other components within normal limits  D-DIMER, QUANTITATIVE (NOT AT [facility]) - Abnormal; Notable for the following components:   D-Dimer, Quant 1.92 (*)    All other components within normal limits  TROPONIN I (HIGH SENSITIVITY)  TROPONIN I (HIGH SENSITIVITY)    EKG EKG Interpretation Date/Time:  Friday September 19 2023 06:14:17 EST Ventricular Rate:  101 PR Interval:  167 QRS Duration:  92 QT Interval:  329 QTC Calculation: 427 R Axis:   -64  Text Interpretation: Sinus tachycardia Left axis deviation Borderline T abnormalities, anterior leads Since last tracing rate faster Confirmed by [name] [phone]) on 09/19/2023 8:18:56 AM  Radiology CT Angio Chest PE W and/or Wo Contrast Result Date: 09/19/2023 CLINICAL DATA:  Chest pain for 12 hours. Colon cancer. Metastatic disease. * Tracking Code: BO * EXAM: CT ANGIOGRAPHY CHEST WITH CONTRAST TECHNIQUE: Multidetector CT imaging of the chest was performed using the standard protocol during bolus administration of intravenous contrast. Multiplanar CT image  reconstructions and MIPs were obtained to evaluate the vascular anatomy. RADIATION DOSE REDUCTION: This exam was performed according to the departmental dose-optimization program which includes automated exposure control, adjustment of the mA and/or kV according to patient size and/or use of iterative reconstruction technique. CONTRAST:  75mL OMNIPAQUE IOHEXOL 350 MG/ML SOLN COMPARISON:  CTA 09/05/2023. FINDINGS: Cardiovascular: There is enlargement of  the pulmonary arteries. Please correlate for pulmonary artery hypertension. No segmental or larger pulmonary embolism identified. Heart is otherwise nonenlarged. Small pericardial effusion. The thoracic aorta grossly has a normal course and caliber. Right IJ chest port is accessed. Tip of the catheter extends into the azygous vein. This may be repositioned. This was seen previously. Mediastinum/Nodes: Known markedly enlarged thyroid gland left thyroid lobe with mass effect and shift of the trachea from left-to-right. Slightly patulous thoracic esophagus with slight wall thickening. There is anasarca with scattered edema along the mediastinum as well. No specific abnormal node enlargement along the axillary regions or hila. There are some separate mediastinal soft tissue lesions identified such is anterior on the left consistent with lymph node enlargement measuring up to 4.0 by 2.0 cm on image 54 series 4. Not significant changed when adjusted for technique compared to the recent prior. Lungs/Pleura: Advanced emphysematous lung changes identified. No pneumothorax or consolidation. Mild bandlike opacities dependently along bases. Atelectasis is favored. There is increased from previous. Trace bilateral pleural fluid. Upper Abdomen: Upper abdomen is somewhat limited by technique. Known multiple abnormal nodes suggested. Poor visualization of the liver, likely to the technique in the contrast bolus. Musculoskeletal: Mild degenerative changes seen along the spine.  Critical Value/emergent results were called by telephone at the time of interpretation on 09/19/2023 at 2:58 pm to provider [facility] , who verbally acknowledged these results. Review of the MIP images confirms the above findings. IMPRESSION: Enlarged pulmonary arteries consistent with pulmonary hypertension. No segmental or larger pulmonary embolism. Extensive emphysematous lung changes. Right IJ chest port in place. Tip extends into the azygous vein. This may need to be repositioned. Multinodular goiter.  Persistent abnormal lymph nodes. Extensive anasarca. Limited evaluation of the abdomen and mediastinum Aortic Atherosclerosis (ICD10-I70.0) and Emphysema (ICD10-J43.9). Electronically Signed   By: Karen Kays M.D.   On: 09/19/2023 15:01   DG Chest 2 View Result Date: 09/19/2023 CLINICAL DATA:  53 year old female history of intermittent chest pain for the past 12 hours. EXAM: CHEST - 2 VIEW COMPARISON:  Chest x-ray 09/05/2023. FINDINGS: Right internal jugular single-lumen Port-A-Cath with tip projecting over the expected location of the azygous vein. Lung volumes are normal. No consolidative airspace disease. No pleural effusions. No pneumothorax. No pulmonary nodule or mass noted. Pulmonary vasculature and the cardiomediastinal silhouette are within normal limits. IMPRESSION: 1. Support apparatus, as above. 2. No radiographic evidence of acute cardiopulmonary disease. Electronically Signed   By: Trudie Reed M.D.   On: 09/19/2023 10:13    Procedures Procedures    Medications Ordered in ED Medications  HYDROmorphone (DILAUDID) injection 0.5 mg (has no administration in time range)  HYDROmorphone (DILAUDID) injection 1 mg (1 mg Intravenous Given 09/19/23 0857)  ondansetron (ZOFRAN) injection 4 mg (4 mg Intravenous Given 09/19/23 0901)  ketorolac (TORADOL) 15 MG/ML injection 15 mg (15 mg Intravenous Given 09/19/23 0950)  iohexol (OMNIPAQUE) 350 MG/ML injection 75 mL (75 mLs Intravenous Contrast Given  09/19/23 1308)    ED Course/ Medical Decision Making/ A&P Clinical Course as of 09/19/23 1506  Fri Sep 19, 2023  0928 CBC(!) Anemia stable.  Metabolic panel normal [JK]  0928 Troponin I (High Sensitivity) Troponin normal [JK]  1220 Troponin I (High Sensitivity) Serial troponin normal.  CBC and metabolic panel unremarkable.  D-dimer elevated at 1.19 [JK]  1220 Chest x-ray without acute abnormalities [JK]  1459 CT angio chest, no pe.  Port cath in the azogous vein.  Pt is scheduled to have her catheter removed today.   [  JK]    Clinical Course User Index [JK] [name], MD                                 Medical Decision Making Amount and/or Complexity of Data Reviewed Labs: ordered. Decision-making details documented in ED Course. Radiology: ordered.  Risk Prescription drug management.   Patient presented to the ED for evaluation of chest pain.  Patient does have history of metastatic colon cancer.  Patient is on chronic opiate pain medications.  ED workup did not show any evidence of pneumonia.  Cardiac enzymes were normal.  Symptoms atypical for ACS.  Patient did have an elevated D-dimer and with her history of cancer a CT angiogram was performed.  CT angiogram [facility] without evidence of PE.  However the catheter is in appropriate located in the azygous vein.  Patient was actually scheduled to have that catheter removed today.  She is otherwise stable for discharge and can follow-up for that appointment.        Final Clinical Impression(s) / ED Diagnoses Final diagnoses:  Chest pain, unspecified type    Rx / DC Orders ED Discharge Orders     None         [name], MD 09/19/23 1506

## 2023-09-19 NOTE — ED Triage Notes (Signed)
 Patient arrived with complaints chest pain. Declines NV or shortness of breath. Patient receiving chemo for colon cancer.

## 2023-09-19 NOTE — Progress Notes (Signed)
 Patient provided grant documents and was approved for one-time $1000 [employer] grant to assist with personal expenses while going through treatment. She has a copy of the approval letter and expense sheet in green folder to review along with my card to contact at earliest convenience to discuss in detail.

## 2023-09-19 NOTE — Discharge Instructions (Addendum)
 Follow-up with your cancer doctors to be rechecked.  The CT scan and blood test today did not show any signs of heart attack or blood clot.  Follow-up with your appointment to have your catheter removed as planned

## 2023-09-19 NOTE — ED Triage Notes (Signed)
 Pt comes via [facility] for CP that has been going on for the past 12 hours, hx of anxiety, PTA received ativan at home.

## 2023-09-30 ENCOUNTER — Ambulatory Visit: Payer: Medicaid Other

## 2023-09-30 ENCOUNTER — Other Ambulatory Visit: Payer: Medicaid Other

## 2023-09-30 ENCOUNTER — Ambulatory Visit: Payer: Medicaid Other | Admitting: Nurse Practitioner

## 2023-09-30 NOTE — Assessment & Plan Note (Addendum)
-  [id number] with mesentery node and possible liver metastasis, G2, MMR proficient, KRAS G12D mutation (+) -Patient initially presented with abdominal pain to the emergency room, underwent a colonoscopy in October 2024 which showed a large mass in the splenic lecture of left side colon. -She underwent left hemicolectomy on June 26, 2023.  Large mesenteric mass was also biopsied which confirmed metastatic adenocarcinoma. -Staging CT scan and a liver MRI showed a suspicious 0.8 cm mass in the left lobe of liver, concerning for metastasis.  No other evidence of distant metastatic disease. Liver biopsy on 08/27/2023 was benign.  -I discussed her metastatic disease and the probable incurable nature of her disease. -I recommend systemic treatment with chemotherapy FOLFOX, she started on 08/06/23. -NGS [facility] showed KRAS G12D and PIK3CA mutations, not a candidate for targeted therapy  -Her tumor was tested of for MMR which were normal, she is not a candidate for immunotherapy.

## 2023-10-01 ENCOUNTER — Inpatient Hospital Stay (HOSPITAL_BASED_OUTPATIENT_CLINIC_OR_DEPARTMENT_OTHER): Payer: Medicaid Other | Admitting: Hematology

## 2023-10-01 ENCOUNTER — Other Ambulatory Visit: Payer: Self-pay

## 2023-10-01 ENCOUNTER — Inpatient Hospital Stay: Payer: Medicaid Other

## 2023-10-01 ENCOUNTER — Telehealth: Payer: Self-pay

## 2023-10-01 ENCOUNTER — Inpatient Hospital Stay

## 2023-10-01 VITALS — BP 112/70 | HR 86 | Temp 98.4°F | Resp 21 | Ht 64.0 in | Wt 111.9 lb

## 2023-10-01 DIAGNOSIS — C772 Secondary and unspecified malignant neoplasm of intra-abdominal lymph nodes: Secondary | ICD-10-CM | POA: Diagnosis not present

## 2023-10-01 DIAGNOSIS — C189 Malignant neoplasm of colon, unspecified: Secondary | ICD-10-CM

## 2023-10-01 DIAGNOSIS — Z5112 Encounter for antineoplastic immunotherapy: Secondary | ICD-10-CM | POA: Diagnosis not present

## 2023-10-01 DIAGNOSIS — Z95828 Presence of other vascular implants and grafts: Secondary | ICD-10-CM

## 2023-10-01 LAB — CBC WITH DIFFERENTIAL (CANCER CENTER ONLY)
Abs Immature Granulocytes: 0.01 10*3/uL (ref 0.00–0.07)
Basophils Absolute: 0 10*3/uL (ref 0.0–0.1)
Basophils Relative: 1 %
Eosinophils Absolute: 0.1 10*3/uL (ref 0.0–0.5)
Eosinophils Relative: 2 %
HCT: 33.7 % — ABNORMAL LOW (ref 36.0–46.0)
Hemoglobin: 11.5 g/dL — ABNORMAL LOW (ref 12.0–15.0)
Immature Granulocytes: 0 %
Lymphocytes Relative: 49 %
Lymphs Abs: 3 10*3/uL (ref 0.7–4.0)
MCH: 22.2 pg — ABNORMAL LOW (ref 26.0–34.0)
MCHC: 34.1 g/dL (ref 30.0–36.0)
MCV: 64.9 fL — ABNORMAL LOW (ref 80.0–100.0)
Monocytes Absolute: 1 10*3/uL (ref 0.1–1.0)
Monocytes Relative: 17 %
Neutro Abs: 1.9 10*3/uL (ref 1.7–7.7)
Neutrophils Relative %: 31 %
Platelet Count: 256 10*3/uL (ref 150–400)
RBC: 5.19 MIL/uL — ABNORMAL HIGH (ref 3.87–5.11)
RDW: 23.5 % — ABNORMAL HIGH (ref 11.5–15.5)
WBC Count: 6.1 10*3/uL (ref 4.0–10.5)
nRBC: 0 % (ref 0.0–0.2)

## 2023-10-01 LAB — CMP (CANCER CENTER ONLY)
ALT: 7 U/L (ref 0–44)
AST: 14 U/L — ABNORMAL LOW (ref 15–41)
Albumin: 3.7 g/dL (ref 3.5–5.0)
Alkaline Phosphatase: 65 U/L (ref 38–126)
Anion gap: 8 (ref 5–15)
BUN: 12 mg/dL (ref 6–20)
CO2: 24 mmol/L (ref 22–32)
Calcium: 9.3 mg/dL (ref 8.9–10.3)
Chloride: 107 mmol/L (ref 98–111)
Creatinine: 0.57 mg/dL (ref 0.44–1.00)
GFR, Estimated: >60 mL/min (ref >60)
Glucose, Bld: 99 mg/dL (ref 70–99)
Potassium: 3.8 mmol/L (ref 3.5–5.1)
Sodium: 139 mmol/L (ref 135–145)
Total Bilirubin: 0.3 mg/dL (ref 0.0–1.2)
Total Protein: 7 g/dL (ref 6.5–8.1)

## 2023-10-01 NOTE — Progress Notes (Signed)
 Pt's last CT scan showed catheter tip malpositioned. Dr. Mosetta Putt and team along with Charge RN Lowella Bandy made aware. Unable to access port due to position. Dr. Mosetta Putt to contact Dr. Michaell Cowing regarding this. Pt informed. Lab appt made for peripheral draw. Pt arrived.

## 2023-10-01 NOTE — Progress Notes (Signed)
 Pt will go to GI for port reposition or replacement on 10/06/2023.  Dr. Mosetta Putt wants the pt to come in the week of 10/06/2023 to have missed cycle 5 Day 1 done if Infusion can accommodate.  Scheduling message was sent to high priority basket to get pt rescheduled.  Pt's future appts will also need to be corrected d/t pt's appts are now off by a wk.  Dr. Mosetta Putt and Vincent Gros, NP were sent a staff message along with Patricia Nettle regarding correcting tx plan date to next week and correct pt's future appts.

## 2023-10-01 NOTE — Telephone Encounter (Signed)
 Spoke with pt via telephone regarding port repair appt scheduled on 10/06/2023.  Pt stated she spoke with the scheduler from GI and the appt is scheduled on 10/06/2023.  Informed pt that the scheduler forgot to mention that the pt is to be NPO 6 hr prior to procedure.  Pt verbalized understanding of instructions.  Pt also mentioned that she needs a note from Dr. Mosetta Putt regarding her health issues and unable to climb a lot of stairs d/t these issues.  Pt stated the letter is needed to assist with obtain a new residence w/o steps and assistance with pt's May rent.  Asked pt has she spoken with the [facility] Social Worker in the past.  Pt stated "NO".  Stated this nurse will reach out to our Child psychotherapist regarding pt's request.  Stated one of the [employer] Social Workers will contact the pt to get further details regarding the pt's request.  Pt verbalized understanding and stated she will await their call.

## 2023-10-01 NOTE — Progress Notes (Signed)
 [facility]   [phone] Fax:(336) [phone]   Clinic Follow up Note   Patient Care Team: [name], NP as PCP - General (Internal Medicine) [name], MD as Consulting Physician (Hematology and Oncology) [name], MD as Consulting Physician (General Surgery) [name], MD as Consulting Physician (Gastroenterology)  Date of Service:  10/01/2023  CHIEF COMPLAINT: f/u of metastatic colon cancer  CURRENT THERAPY:  First-line chemotherapy FOLFOX and bevacizumab  Oncology History   Colon cancer metastasized to mesenteric lymph nodes (HCC) -[id number] with mesentery node and possible liver metastasis, G2, MMR proficient, KRAS G12D mutation (+) -Patient initially presented with abdominal pain to the emergency room, underwent a colonoscopy in October 2024 which showed a large mass in the splenic lecture of left side colon. -She underwent left hemicolectomy on June 26, 2023.  Large mesenteric mass was also biopsied which confirmed metastatic adenocarcinoma. -Staging CT scan and a liver MRI showed a suspicious 0.8 cm mass in the left lobe of liver, concerning for metastasis.  No other evidence of distant metastatic disease. Liver biopsy on 08/27/2023 was benign.  -I discussed her metastatic disease and the probable incurable nature of her disease. -I recommend systemic treatment with chemotherapy FOLFOX, she started on 08/06/23. -NGS [facility] showed KRAS G12D and PIK3CA mutations, not a candidate for targeted therapy  -Her tumor was tested of for MMR which were normal, she is not a candidate for immunotherapy.   Assessment and Plan    Metastatic colon cancer Metastatic colon cancer with a malfunctioning chemotherapy port requiring revision. Chemotherapy is on hold due to the port issue. She expressed concern about the port revision procedure and was reassured of its necessity and safety. Inquired about alternative chemotherapy methods but  was informed the current regimen requires the port. The port revision will involve sedation similar to the initial placement. - Schedule port revision with Dr. Michaell Cowing or an interventional radiologist. - Hold chemotherapy treatment until port issue is resolved.  Weight loss Weight loss to 111.9 pounds from 114 pounds a month ago, attributed to cancer and chemotherapy. She is attempting to eat more but experiences difficulty due to stomach tightness. - Encourage small, frequent meals to manage weight.  Pulmonary hypertension Pulmonary hypertension likely related to chronic lung disease, not associated with cancer, and does not require urgent treatment.  Emphysema Emphysema likely due to smoking. The condition is irreversible. She reported smoking half a cigarette two months ago due to stress. Experiences mild chest pain associated with lungs. - Advise smoking cessation to prevent further lung damage.  Chronic pain Chronic pain, particularly in the lower back. She avoids pain medication due to side effects like constipation. Previously used oxycodone but stopped due to adverse effects. Declined further pain management consultation. - Cancel palliative care appointment scheduled for March 26. - Do not refill oxycodone prescription.  Plan -Will reposition her port by IR in the next week -Postpone chemotherapy to next week        SUMMARY OF ONCOLOGIC HISTORY: Oncology History  Colon cancer metastasized to mesenteric lymph nodes (HCC)  06/26/2023 Cancer Staging   Staging form: Colon and Rectum, AJCC 8th Edition - Pathologic stage from 06/26/2023: Stage IVC (pT4a, pN1b, pM1c) - Signed by [name], MD on 07/25/2023 Residual tumor (R): R1 - Microscopic   07/25/2023 Initial Diagnosis   Colon cancer metastasized to mesenteric lymph nodes (HCC)   08/06/2023 -  Chemotherapy   Patient is on Treatment Plan : COLORECTAL FOLFOX + Bevacizumab q14d  Discussed the use of AI scribe software  for clinical note transcription with the patient, who gave verbal consent to proceed.  History of Present Illness   The patient, a 53 year old female with metastatic colon cancer, presents with concerns about her port placement, lung disease, and pain management. The patient reports that her port is not in the correct position and needs to be revised. She expresses concern about the procedure, but understands it is necessary for her ongoing chemotherapy treatment.  The patient also mentions a diagnosis of lung disease, specifically pulmonary hypertension and emphysema, which she believes is causing chest pain. She denies current smoking but admits to a past history. She also reports having pain in her lower back, which she manages without pain medication due to previous negative experiences with constipation.  The patient expresses frustration with her inability to gain weight despite eating regularly. She also mentions a desire to avoid further pain medication, expressing a preference for managing her pain without it.         All other systems were reviewed with the patient and are negative.  MEDICAL HISTORY:  Past Medical History:  Diagnosis Date   Anemia    Asthma    Bartholin cyst    Chiari malformation type I (HCC)    Hernia, inguinal    Hernia, inguinal    Hypertension    ILD (interstitial lung disease) (HCC)     SURGICAL HISTORY: Past Surgical History:  Procedure Laterality Date   fibroid freezing     unsure   IR GENERIC HISTORICAL  07/27/2014   IR RADIOLOGIST EVAL & MGMT 07/27/2014 [name], MD [facility] INTERV RAD   PORTACATH PLACEMENT N/A 07/31/2023   Procedure: PLACEMENT OF PORT A CATHETER UNDER ULTRASOUND AND FLUOROSCOPIC GUIDANCE;  Surgeon: Karie Soda, MD;  Location: [facility];  Service: General;  Laterality: N/A;  GEN WITH ERAS 1.5 HOURS TOTAL   ROBOTIC ASSISTED TOTAL HYSTERECTOMY N/A 09/15/2018   Procedure: XI ROBOTIC ASSISTED TOTAL HYSTERECTOMY WITH SALPINGECTOMY;   Surgeon: Willodean Rosenthal, MD;  Location: [facility];  Service: Gynecology;  Laterality: N/A;    I have reviewed the social history and family history with the patient and they are unchanged from previous note.  ALLERGIES:  is allergic to hydrocodone.  MEDICATIONS:  Current Outpatient Medications  Medication Sig Dispense Refill   albuterol (VENTOLIN HFA) 108 (90 Base) MCG/ACT inhaler Inhale 1-2 puffs into the lungs every 6 (six) hours as needed for shortness of breath. 2 each 1   amLODipine (NORVASC) 10 MG tablet Take 1 tablet (10 mg total) by mouth daily. 30 tablet 2   ferrous sulfate 325 (65 FE) MG tablet Take 1 tablet (325 mg total) by mouth daily with breakfast. 90 tablet 0   lidocaine-prilocaine (EMLA) cream Apply to affected area once (Patient taking differently: Apply 1 Application topically as needed (for port access).) 30 g 3   LORazepam (ATIVAN) 0.5 MG tablet Take 1 tablet (0.5 mg total) by mouth every 12 (twelve) hours as needed for anxiety. 30 tablet 0   mirtazapine (REMERON) 7.5 MG tablet Take 1 tablet (7.5 mg total) by mouth at bedtime. 30 tablet 0   ondansetron (ZOFRAN) 8 MG tablet Take 1 tablet (8 mg total) by mouth every 8 (eight) hours as needed for nausea or vomiting. Start on the third day after chemotherapy. 30 tablet 1   oxyCODONE (OXY IR/ROXICODONE) 5 MG immediate release tablet Take 1 tablet (5 mg total) by mouth every 4 (four) hours as  needed for severe pain (pain score 7-10). 20 tablet 0   polyethylene glycol (MIRALAX) 17 g packet Take 17 g by mouth 2 (two) times daily. 30 each 0   prochlorperazine (COMPAZINE) 10 MG tablet Take 1 tablet (10 mg total) by mouth every 6 (six) hours as needed for nausea or vomiting. 30 tablet 1   No current facility-administered medications for this visit.    PHYSICAL EXAMINATION: ECOG PERFORMANCE STATUS: 1 - Symptomatic but completely ambulatory  Vitals:   10/01/23 1020  BP: 112/70  Pulse: 86  Resp: (!) 21   Temp: 98.4 F (36.9 C)  SpO2: 98%   Wt Readings from Last 3 Encounters:  10/01/23 111 lb 14.4 oz (50.8 kg)  09/17/23 111 lb 3.2 oz (50.4 kg)  09/03/23 114 lb 4.8 oz (51.8 kg)     GENERAL:alert, no distress and comfortable SKIN: skin color, texture, turgor are normal, no rashes or significant lesions EYES: normal, Conjunctiva are pink and non-injected, sclera clear NECK: supple, thyroid normal size, non-tender, without nodularity LYMPH:  no palpable lymphadenopathy in the cervical, axillary  LUNGS: clear to auscultation and percussion with normal breathing effort HEART: regular rate & rhythm and no murmurs and no lower extremity edema ABDOMEN:abdomen soft, non-tender and normal bowel sounds Musculoskeletal:no cyanosis of digits and no clubbing  NEURO: alert & oriented x 3 with fluent speech, no focal motor/sensory deficits      LABORATORY DATA:  I have reviewed the data as listed    Latest Ref Rng & Units 10/01/2023    9:53 AM 09/19/2023    6:20 AM 09/17/2023   11:23 AM  CBC  WBC 4.0 - 10.5 K/uL 6.1  9.4  4.3   Hemoglobin 12.0 - 15.0 g/dL 78.2  95.6  21.3   Hematocrit 36.0 - 46.0 % 33.7  37.5  35.2   Platelets 150 - 400 K/uL 256  233  241         Latest Ref Rng & Units 10/01/2023    9:53 AM 09/19/2023    6:20 AM 09/17/2023   11:23 AM  CMP  Glucose 70 - 99 mg/dL 99  99  91   BUN 6 - 20 mg/dL 12  10  9    Creatinine 0.44 - 1.00 mg/dL 0.86  5.78  4.69   Sodium 135 - 145 mmol/L 139  139  139   Potassium 3.5 - 5.1 mmol/L 3.8  3.5  3.9   Chloride 98 - 111 mmol/L 107  106  106   CO2 22 - 32 mmol/L 24  21  26    Calcium 8.9 - 10.3 mg/dL 9.3  9.2  9.3   Total Protein 6.5 - 8.1 g/dL 7.0   7.8   Total Bilirubin 0.0 - 1.2 mg/dL 0.3   0.5   Alkaline Phos 38 - 126 U/L 65   72   AST 15 - 41 U/L 14   15   ALT 0 - 44 U/L 7   7       RADIOGRAPHIC STUDIES: I have personally reviewed the radiological images as listed and agreed with the findings in the report. No results found.     No orders of the defined types were placed in this encounter.  All questions were answered. The patient knows to call the clinic with any problems, questions or concerns. No barriers to learning was detected. The total time spent in the appointment was 25 minutes.     [name], MD 10/01/2023

## 2023-10-02 ENCOUNTER — Inpatient Hospital Stay: Admitting: Licensed Clinical Social Worker

## 2023-10-02 DIAGNOSIS — C189 Malignant neoplasm of colon, unspecified: Secondary | ICD-10-CM

## 2023-10-02 NOTE — Progress Notes (Signed)
 [facility] CSW Progress Note  Clinical Child psychotherapist contacted patient by phone to answer questions regarding a request for a letter.  Pt reports she needs to take stairs to get into her apartment and given that she becomes winded very easily this has become difficult for her.  Pt was informed by her landlord that if she provided them w/ a letter from her doctor confirming she has a medical condition she could be moved to a first floor apartment.  Requested letter signed by Dr. Mosetta Putt and emailed to pt.  CSW to remain available as appropriate to provide support throughout duration of treatment.      Rachel Moulds, LCSW Clinical Social Worker [facility]    Patient is participating in a Managed Medicaid Plan:  Yes

## 2023-10-03 NOTE — Progress Notes (Signed)
 Reminder call to patient regarding her Port placement on 10/06/23. Instructions provided on NPO status, clothing to wear, what to expect pre, during, and post procedure, reminded her she will need a driver, and we reviewed her mediations and allergies. Time allowed to ask questions Ms. Schnetzer verbalized understanding.

## 2023-10-03 NOTE — Progress Notes (Signed)
 Chief Complaint: Patient was seen in consultation today for metastatic colon cancer with malfunctioning port-a-catheter  Referring Physician(s): Feng,Yan  History of Present Illness: Carol Mack is a 53 y.o. female with a medical history significant for anemia, interstitial lung disease, Chiari malformation type I and metastatic colon cancer diagnosed December 2024. She is s/p partial colectomy 06/26/23. She is familiar to IR from a liver lesion biopsy 08/27/23.   She is currently receiving chemotherapy via port-a-catheter placed by Dr. Michaell Cowing 07/31/23. She has been evaluated in the ED several times in the past few months with complaints of chest pain. Her most recent visit was 09/19/23 and a CTA showed stable chronic cystic lung disease. It also showed the port-a-catheter tip to be located in the azygous vein. Dr. Latanya Maudlin office contacted Dr. Michaell Cowing for port revision but he was unable to get the patient in to correct the port.   Interventional Radiology has been asked to evaluate this patient for an image-guided port-a-catheter revision.   Past Medical History:  Diagnosis Date   Anemia    Asthma    Bartholin cyst    Chiari malformation type I (HCC)    Hernia, inguinal    Hernia, inguinal    Hypertension    ILD (interstitial lung disease) (HCC)     Past Surgical History:  Procedure Laterality Date   fibroid freezing     unsure   IR GENERIC HISTORICAL  07/27/2014   IR RADIOLOGIST EVAL & MGMT 07/27/2014 [name], MD [facility] INTERV RAD   PORTACATH PLACEMENT N/A 07/31/2023   Procedure: PLACEMENT OF PORT A CATHETER UNDER ULTRASOUND AND FLUOROSCOPIC GUIDANCE;  Surgeon: Karie Soda, MD;  Location: [facility];  Service: General;  Laterality: N/A;  GEN WITH ERAS 1.5 HOURS TOTAL   ROBOTIC ASSISTED TOTAL HYSTERECTOMY N/A 09/15/2018   Procedure: XI ROBOTIC ASSISTED TOTAL HYSTERECTOMY WITH SALPINGECTOMY;  Surgeon: Willodean Rosenthal, MD;  Location: [facility];  Service:  Gynecology;  Laterality: N/A;    Allergies: Hydrocodone  Medications: Prior to Admission medications   Medication Sig Start Date End Date Taking? Authorizing Provider  albuterol (VENTOLIN HFA) 108 (90 Base) MCG/ACT inhaler Inhale 1-2 puffs into the lungs every 6 (six) hours as needed for shortness of breath. 12/30/22   Milas Hock, MD  amLODipine (NORVASC) 10 MG tablet Take 1 tablet (10 mg total) by mouth daily. 09/05/23   [name], MD  ferrous sulfate 325 (65 FE) MG tablet Take 1 tablet (325 mg total) by mouth daily with breakfast. 07/21/23   [name], MD  lidocaine-prilocaine (EMLA) cream Apply to affected area once Patient taking differently: Apply 1 Application topically as needed (for port access). 09/03/23   [name], MD  LORazepam (ATIVAN) 0.5 MG tablet Take 1 tablet (0.5 mg total) by mouth every 12 (twelve) hours as needed for anxiety. 09/17/23   [name], MD  mirtazapine (REMERON) 7.5 MG tablet Take 1 tablet (7.5 mg total) by mouth at bedtime. 08/06/23   [name], MD  ondansetron (ZOFRAN) 8 MG tablet Take 1 tablet (8 mg total) by mouth every 8 (eight) hours as needed for nausea or vomiting. Start on the third day after chemotherapy. 07/25/23   [name], MD  oxyCODONE (OXY IR/ROXICODONE) 5 MG immediate release tablet Take 1 tablet (5 mg total) by mouth every 4 (four) hours as needed for severe pain (pain score 7-10). 09/17/23   [name], MD  polyethylene glycol (MIRALAX) 17 g packet Take 17 g by mouth 2 (two)  times daily. 05/11/23   [name], MD  prochlorperazine (COMPAZINE) 10 MG tablet Take 1 tablet (10 mg total) by mouth every 6 (six) hours as needed for nausea or vomiting. 07/25/23   [name], MD     Family History  Problem Relation Age of Onset   Thyroid disease Mother        large benign goiter   Cancer Maternal Uncle        UNKNOWN TYPE CANCER   Colon cancer Neg Hx    Esophageal cancer Neg Hx    Rectal cancer Neg Hx    Stomach cancer Neg Hx     Social  History   Socioeconomic History   Marital status: Single    Spouse name: Not on file   Number of children: 2   Years of education: Not on file   Highest education level: Not on file  Occupational History   Not on file  Tobacco Use   Smoking status: Former    Current packs/day: 1.00    Average packs/day: 1 pack/day for 29.2 years (29.2 ttl pk-yrs)    Types: Cigarettes    Start date: 1996    Passive exposure: Current   Smokeless tobacco: Never  Vaping Use   Vaping status: Never Used  Substance and Sexual Activity   Alcohol use: No   Drug use: No   Sexual activity: Not on file  Other Topics Concern   Not on file  Social History Narrative   Not on file   Social Drivers of Health   Financial Resource Strain: Not on file  Food Insecurity: No Food Insecurity (06/26/2023)   Hunger Vital Sign    Worried About Running Out of Food in the Last Year: Never true    Ran Out of Food in the Last Year: Never true  Transportation Needs: No Transportation Needs (06/26/2023)   PRAPARE - Administrator, Civil Service (Medical): No    Lack of Transportation (Non-Medical): No  Physical Activity: Not on file  Stress: Not on file  Social Connections: Not on file    Review of Systems: A 12 point ROS discussed and pertinent positives are indicated in the HPI above.  All other systems are negative.  Vital Signs: LMP  (LMP Unknown)   Physical Exam Constitutional:      General: She is not in acute distress. HENT:     Mouth/Throat:     Mouth: Mucous membranes are moist.     Comments: MP2 Eyes:     General: No scleral icterus. Cardiovascular:     Rate and Rhythm: Normal rate and regular rhythm.  Pulmonary:     Effort: No respiratory distress.  Abdominal:     General: There is no distension.  Skin:    General: Skin is warm and dry.     Coloration: Skin is not jaundiced.  Neurological:     Mental Status: She is alert and oriented to person, place, and time.      Imaging: Chest CT 09/19/23   CXR 09/19/23   CXR 07/31/23 (date of placement)     Labs:  CBC: Recent Labs    09/05/23 1718 09/17/23 1123 09/19/23 0620 10/01/23 0953  WBC 6.9 4.3 9.4 6.1  HGB 11.0* 11.7* 12.3 11.5*  HCT 33.4* 35.2* 37.5 33.7*  PLT 283 241 233 256    COAGS: Recent Labs    08/27/23 0632  INR 1.1    BMP: Recent Labs    09/05/23 1718 09/17/23  1123 09/19/23 0620 10/01/23 0953  NA 137 139 139 139  K 3.8 3.9 3.5 3.8  CL 102 106 106 107  CO2 25 26 21* 24  GLUCOSE 102* 91 99 99  BUN 9 9 10 12   CALCIUM 9.1 9.3 9.2 9.3  CREATININE 0.52 0.57 0.49 0.57  GFRNONAA >60 >60 >60 >60    LIVER FUNCTION TESTS: Recent Labs    09/03/23 1116 09/05/23 1718 09/17/23 1123 10/01/23 0953  BILITOT 0.2 0.8 0.5 0.3  AST 12* 18 15 14*  ALT 6 10 7 7   ALKPHOS 65 62 72 65  PROT 6.3* 7.3 7.8 7.0  ALBUMIN 3.3* 3.5 4.0 3.7    TUMOR MARKERS: Recent Labs    05/13/23 1232  CEA 14.4*    Assessment and Plan:  Metastatic colon cancer currently receiving chemotherapy; malpositioned port-a-catheter: Liliane Channel, 53 year old female, presents today for an image-guided port-a-catheter revision as the current catheter tip is positioned in the azygous vein.  Specifically, we discussed catheter and wire manipulation of the port catheter tip with avoidance of catheter replacement.  She is amenable to this approach, however refuses today to have the port pocket/reservoir revised today.  Risks and benefits of image-guided Port-a-catheter revision were discussed with the patient including, but not limited to bleeding, infection, pneumothorax, or fibrin sheath development and need for additional procedures.  All of the patient's questions were answered, patient is agreeable to proceed. She has been NPO.   Consent signed and in chart.  Marliss Coots, MD Pager: [phone]

## 2023-10-06 ENCOUNTER — Ambulatory Visit
Admission: RE | Admit: 2023-10-06 | Discharge: 2023-10-06 | Disposition: A | Discharge status: Home / Self Care | Source: Ambulatory Visit | Attending: Hematology

## 2023-10-06 DIAGNOSIS — C189 Malignant neoplasm of colon, unspecified: Secondary | ICD-10-CM

## 2023-10-06 DIAGNOSIS — Z95828 Presence of other vascular implants and grafts: Secondary | ICD-10-CM

## 2023-10-06 HISTORY — PX: IR PORT REPAIR CENTRAL VENOUS ACCESS DEVICE: IMG5775

## 2023-10-06 MED ORDER — FENTANYL CITRATE (PF) 100 MCG/2ML IJ SOLN
INTRAMUSCULAR | Status: AC | PRN
  Administered 2023-10-06 (×2): 50 ug via INTRAVENOUS

## 2023-10-06 MED ORDER — MIDAZOLAM HCL 2 MG/2ML IJ SOLN: 1.0000 mg | INTRAMUSCULAR | Status: DC | PRN

## 2023-10-06 MED ORDER — MIDAZOLAM HCL 2 MG/2ML IJ SOLN
INTRAMUSCULAR | Status: AC | PRN
  Administered 2023-10-06 (×2): 1 mg via INTRAVENOUS
  Administered 2023-10-06: .5 mg via INTRAVENOUS
  Administered 2023-10-06: 1 mg via INTRAVENOUS

## 2023-10-06 MED ORDER — HEPARIN SOD (PORK) LOCK FLUSH 100 UNIT/ML IV SOLN
500.0000 [IU] | Freq: Once | INTRAVENOUS | Status: AC
  Administered 2023-10-06: 500 [IU]

## 2023-10-06 MED ORDER — IOPAMIDOL (ISOVUE-M 200) INJECTION 41%
10.0000 mL | Freq: Once | INTRAMUSCULAR | Status: AC
  Administered 2023-10-06: 10 mL

## 2023-10-06 MED ORDER — SODIUM CHLORIDE 0.9 % IV SOLN: INTRAVENOUS | Status: DC

## 2023-10-06 MED ORDER — LIDOCAINE-EPINEPHRINE 1 %-1:100000 IJ SOLN
10.0000 mL | Freq: Once | INTRAMUSCULAR | Status: AC
  Administered 2023-10-06: 10 mL via INTRADERMAL

## 2023-10-06 MED ORDER — FENTANYL CITRATE PF 50 MCG/ML IJ SOSY: 25.0000 ug | PREFILLED_SYRINGE | INTRAMUSCULAR | Status: DC | PRN

## 2023-10-06 NOTE — Progress Notes (Signed)
 Carol Mack called at 1400 due to patient has not arrived no answer message left to return call. Arrival time was 1330.

## 2023-10-06 NOTE — Procedures (Signed)
 Interventional Radiology Procedure Note  Procedure: Port revision  Findings: Please refer to procedural dictation for full description. Right CFV approach to grasp and remove the catheter tip from azygous position, repositioned near cavoatrial junction.  Port flushes and aspirates well, no fibrin sheath or thrombus on venogram.   Complications: None immediate  Estimated Blood Loss: < 5 ml  Recommendations: Catheter ready for continued use.   Marliss Coots, MD

## 2023-10-06 NOTE — Discharge Instructions (Signed)
 Implanted Port Insertion After Care   What can I expect after the procedure?   After the procedure, it is common to have:   Discomfort at the port insertion site.   Bruising on the skin over the port. This should improve over 3-4 days.    Port care   After your port is placed, you will get a manufacturer's information card. The card has information about your port. Keep this card with you at all times.   Take care of the port as told by your health care provider. Ask your health care provider if you or a family member can get training for taking care of the port at home.   Make sure to remember what type of port you have.       Incision care   Follow instructions from your health care provider about how to take care of your port insertion site. Make sure you:   Wash your hands with soap and water for at least 20 seconds before and after you change your bandage (dressing). If soap and water are not available, use hand sanitizer.        Leave your initial bandage on for a full 24 hours.     After 24 hours you may remove the dressing and shower.  Do not scrub directly on the incision site but rather above it and let the soapy water run over the incision.  Pat dry after.    You may then opt to redress your incision for your comfort but you may just leave it open to air.  The Dermabond (surgical super glue) will protect your incision and keep it clean and dry.    Leave the layer of skin glue in place. If the glue edges start to loosen and curl up, you may trim the loose edges but do not pull or pick at it.   Do NOT apply neosporin or other antibacterial ointment to the surgical glue.  It will dissolve the glue and expose your new incision to possible infection.     Do NOT apply EMLA numbing cream to the surgical glue.  It will dissolve the glue and expose your new incision to possible infection.  You may have to wait to use the EMLA cream until your incision has healed.    Check  your port insertion site every day for signs of infection. Check for:   Redness, swelling, or pain.   Fluid or blood.   Warmth.   Pus or a bad smell.      Activity   Return to your normal activities as told by your health care provider. Ask your health care provider what activities are safe for you.   You may have to avoid lifting. Ask your health care provider how much you can safely lift.   General instructions   Take over-the-counter and prescription medicines only as told by your health care provider.   Do not take baths, swim, or use a hot tub until your incision has healed completely (usually 2 weeks).    If you were given a sedative during the procedure, it can affect you for several hours. Do not drive, operate machinery or sign important documents for 24 hours after your procedure.   Keep all follow-up visits. This is important.   Please contact our office at [phone] or you may call [phone] and ask to speak to [facility] the nurse for the following symptoms:   You have a fever or chills.  You have redness, swelling, or pain around your port insertion site.   You have fluid or blood coming from your port insertion site.   Your port insertion site feels warm to the touch.   You have pus or a bad smell coming from the port insertion site.   Get help right away if:   You have chest pain or shortness of breath.   You have bleeding from your port that you cannot control.   Do not wait to see if the symptoms will go away.   Do not drive yourself to the hospital.      These symptoms may be an emergency.    Get help right away. Call 911.     Summary   Take care of the port as told by your health care provider. Keep the manufacturer's information card with you at all times.   Keep your dressing on for 24 hours!  After that you can shower and redress the site only as needed.    No neosporin, antibiotic ointment or EMLA numbing cream on the glue  over your incision   Do not submerge your incision under water in a bath, pool or hot tube until fully healed   Contact a health care provider if you have a fever or chills or if you have redness, swelling, or pain around your port insertion site.   Keep all follow-up visits.   If you need to speak to someone after hours (5:00PM) please contact the on-call IR MD at [phone]. Tell them you are a patient of Dr. Elby Showers; you had a Port placed today and any issues you are experiencing.   Thank you for visiting [facility] today!

## 2023-10-06 NOTE — Progress Notes (Signed)
 At 10:11AM Carol Mack called asking if she could drink coffee at this time, I advised her not too since her procedure is at 1430 today. I explained the risk of aspiration in detail and Carol Mack stated I think it will be ok, at that time I explained that if she decides to drink or eat anything against medical advice and a medical emergency occurs then we will not be liable, she then stated she would not drink anything.

## 2023-10-07 ENCOUNTER — Inpatient Hospital Stay

## 2023-10-07 ENCOUNTER — Encounter: Payer: Self-pay | Admitting: Hematology

## 2023-10-07 ENCOUNTER — Inpatient Hospital Stay (HOSPITAL_BASED_OUTPATIENT_CLINIC_OR_DEPARTMENT_OTHER): Admitting: Hematology

## 2023-10-07 ENCOUNTER — Other Ambulatory Visit: Payer: Self-pay

## 2023-10-07 ENCOUNTER — Inpatient Hospital Stay: Admitting: Licensed Clinical Social Worker

## 2023-10-07 VITALS — BP 104/82 | HR 84 | Temp 97.6°F | Resp 16 | Ht 64.0 in | Wt 115.9 lb

## 2023-10-07 DIAGNOSIS — C189 Malignant neoplasm of colon, unspecified: Secondary | ICD-10-CM | POA: Diagnosis not present

## 2023-10-07 DIAGNOSIS — C772 Secondary and unspecified malignant neoplasm of intra-abdominal lymph nodes: Secondary | ICD-10-CM

## 2023-10-07 DIAGNOSIS — Z95828 Presence of other vascular implants and grafts: Secondary | ICD-10-CM

## 2023-10-07 DIAGNOSIS — Z5112 Encounter for antineoplastic immunotherapy: Secondary | ICD-10-CM | POA: Diagnosis not present

## 2023-10-07 LAB — CBC WITH DIFFERENTIAL (CANCER CENTER ONLY)
Abs Immature Granulocytes: 0 10*3/uL (ref 0.00–0.07)
Basophils Absolute: 0.1 10*3/uL (ref 0.0–0.1)
Basophils Relative: 1 %
Eosinophils Absolute: 0.1 10*3/uL (ref 0.0–0.5)
Eosinophils Relative: 2 %
HCT: 33.8 % — ABNORMAL LOW (ref 36.0–46.0)
Hemoglobin: 11.5 g/dL — ABNORMAL LOW (ref 12.0–15.0)
Immature Granulocytes: 0 %
Lymphocytes Relative: 53 %
Lymphs Abs: 2.9 10*3/uL (ref 0.7–4.0)
MCH: 22.7 pg — ABNORMAL LOW (ref 26.0–34.0)
MCHC: 34 g/dL (ref 30.0–36.0)
MCV: 66.7 fL — ABNORMAL LOW (ref 80.0–100.0)
Monocytes Absolute: 1.1 10*3/uL — ABNORMAL HIGH (ref 0.1–1.0)
Monocytes Relative: 21 %
Neutro Abs: 1.2 10*3/uL — ABNORMAL LOW (ref 1.7–7.7)
Neutrophils Relative %: 23 %
Platelet Count: 328 10*3/uL (ref 150–400)
RBC: 5.07 MIL/uL (ref 3.87–5.11)
RDW: 23.9 % — ABNORMAL HIGH (ref 11.5–15.5)
WBC Count: 5.4 10*3/uL (ref 4.0–10.5)
nRBC: 0 % (ref 0.0–0.2)

## 2023-10-07 LAB — CMP (CANCER CENTER ONLY)
ALT: 11 U/L (ref 0–44)
AST: 16 U/L (ref 15–41)
Albumin: 3.7 g/dL (ref 3.5–5.0)
Alkaline Phosphatase: 71 U/L (ref 38–126)
Anion gap: 6 (ref 5–15)
BUN: 12 mg/dL (ref 6–20)
CO2: 26 mmol/L (ref 22–32)
Calcium: 9.2 mg/dL (ref 8.9–10.3)
Chloride: 106 mmol/L (ref 98–111)
Creatinine: 0.58 mg/dL (ref 0.44–1.00)
GFR, Estimated: >60 mL/min (ref >60)
Glucose, Bld: 149 mg/dL — ABNORMAL HIGH (ref 70–99)
Potassium: 3.9 mmol/L (ref 3.5–5.1)
Sodium: 138 mmol/L (ref 135–145)
Total Bilirubin: 0.4 mg/dL (ref 0.0–1.2)
Total Protein: 7.3 g/dL (ref 6.5–8.1)

## 2023-10-07 MED ORDER — LEUCOVORIN CALCIUM INJECTION 350 MG
400.0000 mg/m2 | Freq: Once | INTRAVENOUS | Status: AC
  Administered 2023-10-07: 600 mg via INTRAVENOUS
  Filled 2023-10-07: qty 30

## 2023-10-07 MED ORDER — DEXTROSE 5 % IV SOLN: INTRAVENOUS | Status: DC

## 2023-10-07 MED ORDER — OXALIPLATIN CHEMO INJECTION 100 MG/20ML
85.0000 mg/m2 | Freq: Once | INTRAVENOUS | Status: AC
  Administered 2023-10-07: 130 mg via INTRAVENOUS
  Filled 2023-10-07: qty 6

## 2023-10-07 MED ORDER — DEXAMETHASONE SODIUM PHOSPHATE 10 MG/ML IJ SOLN
10.0000 mg | Freq: Once | INTRAMUSCULAR | Status: AC
  Administered 2023-10-07: 10 mg via INTRAVENOUS
  Filled 2023-10-07: qty 1

## 2023-10-07 MED ORDER — FLUOROURACIL CHEMO INJECTION 2.5 GM/50ML
400.0000 mg/m2 | Freq: Once | INTRAVENOUS | Status: AC
  Administered 2023-10-07: 600 mg via INTRAVENOUS
  Filled 2023-10-07: qty 12

## 2023-10-07 MED ORDER — PANTOPRAZOLE SODIUM 40 MG PO TBEC: 40.0000 mg | DELAYED_RELEASE_TABLET | Freq: Every day | ORAL | 0 refills | Status: DC

## 2023-10-07 MED ORDER — PALONOSETRON HCL INJECTION 0.25 MG/5ML
0.2500 mg | Freq: Once | INTRAVENOUS | Status: AC
  Administered 2023-10-07: 0.25 mg via INTRAVENOUS
  Filled 2023-10-07: qty 5

## 2023-10-07 MED ORDER — SODIUM CHLORIDE 0.9 % IV SOLN: INTRAVENOUS | Status: DC

## 2023-10-07 MED ORDER — SODIUM CHLORIDE 0.9 % IV SOLN
2400.0000 mg/m2 | INTRAVENOUS | Status: DC
  Administered 2023-10-07: 3500 mg via INTRAVENOUS
  Filled 2023-10-07: qty 70

## 2023-10-07 MED ORDER — LORAZEPAM 1 MG PO TABS
0.5000 mg | ORAL_TABLET | Freq: Once | ORAL | Status: AC
  Administered 2023-10-07: 0.5 mg via ORAL
  Filled 2023-10-07: qty 1

## 2023-10-07 MED ORDER — SODIUM CHLORIDE 0.9 % IV SOLN
5.0000 mg/kg | Freq: Once | INTRAVENOUS | Status: AC
Start: 2023-10-07 — End: 2023-10-07
  Administered 2023-10-07: 250 mg via INTRAVENOUS
  Filled 2023-10-07: qty 10

## 2023-10-07 MED ORDER — SODIUM CHLORIDE 0.9% FLUSH
10.0000 mL | Freq: Once | INTRAVENOUS | Status: AC
  Administered 2023-10-07: 10 mL

## 2023-10-07 NOTE — Progress Notes (Signed)
 Per Dr. Mosetta Putt, okay to treat with ANC 1.2.

## 2023-10-07 NOTE — Patient Instructions (Signed)
 [facility] - A DEPT OF [facility]  Discharge Instructions: Thank you for choosing [facility] to provide your oncology and hematology care.   If you have a lab appointment with the Cancer Center, please go directly to the Cancer Center and check in at the registration area.   Wear comfortable clothing and clothing appropriate for easy access to any Portacath or PICC line.   We strive to give you quality time with your provider. You may need to reschedule your appointment if you arrive late (15 or more minutes).  Arriving late affects you and other patients whose appointments are after yours.  Also, if you miss three or more appointments without notifying the office, you may be dismissed from the clinic at the provider's discretion.      For prescription refill requests, have your pharmacy contact our office and allow 72 hours for refills to be completed.    Today you received the following chemotherapy and/or immunotherapy agents: bevacizumab, oxaliplatin, leucovorin, fluorouracil       To help prevent nausea and vomiting after your treatment, we encourage you to take your nausea medication as directed.  BELOW ARE SYMPTOMS THAT SHOULD BE REPORTED IMMEDIATELY: *FEVER GREATER THAN 100.4 F (38 C) OR HIGHER *CHILLS OR SWEATING *NAUSEA AND VOMITING THAT IS NOT CONTROLLED WITH YOUR NAUSEA MEDICATION *UNUSUAL SHORTNESS OF BREATH *UNUSUAL BRUISING OR BLEEDING *URINARY PROBLEMS (pain or burning when urinating, or frequent urination) *BOWEL PROBLEMS (unusual diarrhea, constipation, pain near the anus) TENDERNESS IN MOUTH AND THROAT WITH OR WITHOUT PRESENCE OF ULCERS (sore throat, sores in mouth, or a toothache) UNUSUAL RASH, SWELLING OR PAIN  UNUSUAL VAGINAL DISCHARGE OR ITCHING   Items with * indicate a potential emergency and should be followed up as soon as possible or go to the Emergency Department if any problems should occur.  Please show the  CHEMOTHERAPY ALERT CARD or IMMUNOTHERAPY ALERT CARD at check-in to the Emergency Department and triage nurse.  Should you have questions after your visit or need to cancel or reschedule your appointment, please contact [facility] - A DEPT OF [facility]  Dept: [phone]  and follow the prompts.  Office hours are 8:00 a.m. to 4:30 p.m. Monday - Friday. Please note that voicemails left after 4:00 p.m. may not be returned until the following business day.  We are closed weekends and major holidays. You have access to a nurse at all times for urgent questions. Please call the main number to the clinic Dept: [phone] and follow the prompts.   For any non-urgent questions, you may also contact your provider using MyChart. We now offer e-Visits for anyone 56 and older to request care online for non-urgent symptoms. For details visit [email or web address].   Also download the MyChart app! Go to the app store, search "MyChart", open the app, select [place], and log in with your MyChart username and password.

## 2023-10-07 NOTE — Assessment & Plan Note (Signed)
-  [id number] with mesentery node and possible liver metastasis, G2, MMR proficient, KRAS G12D mutation (+) -Patient initially presented with abdominal pain to the emergency room, underwent a colonoscopy in October 2024 which showed a large mass in the splenic lecture of left side colon. -She underwent left hemicolectomy on June 26, 2023.  Large mesenteric mass was also biopsied which confirmed metastatic adenocarcinoma. -Staging CT scan and a liver MRI showed a suspicious 0.8 cm mass in the left lobe of liver, concerning for metastasis.  No other evidence of distant metastatic disease. Liver biopsy on 08/27/2023 was benign.  -I discussed her metastatic disease and the probable incurable nature of her disease. -I recommend systemic treatment with chemotherapy FOLFOX, she started on 08/06/23. -NGS [facility] showed KRAS G12D and PIK3CA mutations, not a candidate for targeted therapy  -Her tumor was tested of for MMR which were normal, she is not a candidate for immunotherapy.

## 2023-10-07 NOTE — Progress Notes (Signed)
 [facility] CSW Progress Note  Visual merchandiser  met w/ pt in infusion.  Pt was given the original letter signed by Dr. Mosetta Putt to be submitted to her leasing office.  CSW also discussed anxiety w/ pt.  Pt will be seeing palliative care later this week and will begin counseling w/ CSW on 3/31.      Rachel Moulds, LCSW Clinical Social Worker [facility]    Patient is participating in a Managed Medicaid Plan:  Yes

## 2023-10-07 NOTE — Progress Notes (Signed)
 [facility]   Telephone:([phone]) [phone] [phone]) [phone]   Clinic Follow up Note   Patient Care Team: [name], NP as PCP - General (Internal Medicine) [name], MD as Consulting Physician (Hematology and Oncology) [name], MD as Consulting Physician (General Surgery) [name], MD as Consulting Physician (Gastroenterology)  Date of Service:  10/07/2023  CHIEF COMPLAINT: f/u of metastatic colon cancer  CURRENT THERAPY:  First-line chemotherapy FOLFOX and bevacizumab  Oncology History   Colon cancer metastasized to mesenteric lymph nodes (HCC) -[id number] with mesentery node and possible liver metastasis, G2, MMR proficient, KRAS G12D mutation (+) -Patient initially presented with abdominal pain to the emergency room, underwent a colonoscopy in October 2024 which showed a large mass in the splenic lecture of left side colon. -She underwent left hemicolectomy on June 26, 2023.  Large mesenteric mass was also biopsied which confirmed metastatic adenocarcinoma. -Staging CT scan and a liver MRI showed a suspicious 0.8 cm mass in the left lobe of liver, concerning for metastasis.  No other evidence of distant metastatic disease. Liver biopsy on 08/27/2023 was benign.  -I discussed her metastatic disease and the probable incurable nature of her disease. -I recommend systemic treatment with chemotherapy FOLFOX, she started on 08/06/23. -NGS [facility] showed KRAS G12D and PIK3CA mutations, not a candidate for targeted therapy  -Her tumor was tested of for MMR which were normal, she is not a candidate for immunotherapy.   Assessment and Plan    Metastatic colon cancer Metastatic colon cancer with lymph node involvement. Recent liver biopsy was benign, but suspicion of liver metastasis remains due to potential false negatives. Currently undergoing chemotherapy to control disease progression. The next CT scan is scheduled for April 7 to assess  treatment response. Maintenance therapy with oral chemotherapy and potential radiation may be considered after six months if lymph nodes are not resectable. - Proceed with chemotherapy today - Schedule CT scan for April 7 to assess response to treatment - Review CT scan findings on April 9 - Consider maintenance therapy with oral chemotherapy and potential radiation after six months  Back pain Intermittent back pain, possibly related to recent port revision. Pain management is complicated by concerns about medication side effects and potential addiction. Previous chest pain potentially related to anxiety or medication side effects contributes to reluctance in taking certain medications. - Refer to [facility] for pain management consultation - Prescribe hydrocodone if Lowella Bandy cannot see her today  Chest pain Intermittent chest pain with unclear etiology. Differential includes anxiety, gastroesophageal reflux disease (GERD), and non-cardiac causes. Previous evaluations ruled out cardiac causes. GERD is suspected, and anxiety may also contribute. - Prescribe medication for GERD to assess impact on chest pain - Discuss chest pain management with [facility]  Anxiety Significant anxiety impacting symptom management and decision-making. Reluctance to take prescribed medications due to fear of side effects. Anxiety may contribute to chest pain and complicates overall management. - Encourage counseling and consultation with social worker - Discuss anxiety management with Nikki  Emphysema Chronic lung disease with dyspnea. No current smoking. Emphysema contributes to respiratory symptoms. Management focuses on symptom control with inhalers. - Continue using inhaler as needed  Plan -Lab reviewed, ANC 1.2, adequate for treatment, will proceed to chemo today and continue every 2 weeks -She has consultation with palliative care NP Nikki in 2 days -I messaged the social worker Darl Pikes to meet her again for counseling  of anxiety -Follow-up in 2 weeks -Restaging CT scan scheduled on April 7,  I reminded her appointment        SUMMARY OF ONCOLOGIC HISTORY: Oncology History  Colon cancer metastasized to mesenteric lymph nodes (HCC)  06/26/2023 Cancer Staging   Staging form: Colon and Rectum, AJCC 8th Edition - Pathologic stage from 06/26/2023: Stage IVC (pT4a, pN1b, pM1c) - Signed by [name], MD on 07/25/2023 Residual tumor (R): R1 - Microscopic   07/25/2023 Initial Diagnosis   Colon cancer metastasized to mesenteric lymph nodes (HCC)   08/06/2023 -  Chemotherapy   Patient is on Treatment Plan : COLORECTAL FOLFOX + Bevacizumab q14d        Discussed the use of AI scribe software for clinical note transcription with the patient, who gave verbal consent to proceed.  History of Present Illness   A 53 year old patient with a history of metastatic colon cancer presents with back pain. The pain started after a recent procedure to fix her port for chemotherapy administration. The patient describes the pain as being in the middle of her back and has persisted since the procedure. She also reports pain at the site of the procedure on her hip. The patient expresses anxiety and concern about her ongoing chemotherapy treatment and the potential for addiction to pain medication. She has previously tried Tylenol and tramadol for pain management, but reports that these were ineffective and caused adverse reactions, respectively. The patient also reports shortness of breath and chest pain, which she attributes to her cancer and recent procedure. She has been experiencing these symptoms intermittently and has visited the emergency room twice due to these symptoms. The patient's companion notes that the patient has been trying to wean herself off of lorazepam and oxycodone due to concerns about potential side effects and addiction.         All other systems were reviewed with the patient and are negative.  MEDICAL  HISTORY:  Past Medical History:  Diagnosis Date   Anemia    Asthma    Bartholin cyst    Chiari malformation type I (HCC)    Hernia, inguinal    Hernia, inguinal    Hypertension    ILD (interstitial lung disease) (HCC)     SURGICAL HISTORY: Past Surgical History:  Procedure Laterality Date   fibroid freezing     unsure   IR GENERIC HISTORICAL  07/27/2014   IR RADIOLOGIST EVAL & MGMT 07/27/2014 [name], MD [facility] INTERV RAD   IR PORT REPAIR CENTRAL VENOUS ACCESS DEVICE  10/06/2023   PORTACATH PLACEMENT N/A 07/31/2023   Procedure: PLACEMENT OF PORT A CATHETER UNDER ULTRASOUND AND FLUOROSCOPIC GUIDANCE;  Surgeon: Karie Soda, MD;  Location: [facility];  Service: General;  Laterality: N/A;  GEN WITH ERAS 1.5 HOURS TOTAL   ROBOTIC ASSISTED TOTAL HYSTERECTOMY N/A 09/15/2018   Procedure: XI ROBOTIC ASSISTED TOTAL HYSTERECTOMY WITH SALPINGECTOMY;  Surgeon: Willodean Rosenthal, MD;  Location: [facility];  Service: Gynecology;  Laterality: N/A;    I have reviewed the social history and family history with the patient and they are unchanged from previous note.  ALLERGIES:  is allergic to hydrocodone.  MEDICATIONS:  Current Outpatient Medications  Medication Sig Dispense Refill   pantoprazole (PROTONIX) 40 MG tablet Take 1 tablet (40 mg total) by mouth daily. 30 tablet 0   albuterol (VENTOLIN HFA) 108 (90 Base) MCG/ACT inhaler Inhale 1-2 puffs into the lungs every 6 (six) hours as needed for shortness of breath. 2 each 1   amLODipine (NORVASC) 10 MG tablet Take 1 tablet (10 mg total) by  mouth daily. 30 tablet 2   ferrous sulfate 325 (65 FE) MG tablet Take 1 tablet (325 mg total) by mouth daily with breakfast. 90 tablet 0   lidocaine-prilocaine (EMLA) cream Apply to affected area once (Patient taking differently: Apply 1 Application topically as needed (for port access).) 30 g 3   LORazepam (ATIVAN) 0.5 MG tablet Take 1 tablet (0.5 mg total) by mouth every 12 (twelve) hours as  needed for anxiety. 30 tablet 0   mirtazapine (REMERON) 7.5 MG tablet Take 1 tablet (7.5 mg total) by mouth at bedtime. 30 tablet 0   ondansetron (ZOFRAN) 8 MG tablet Take 1 tablet (8 mg total) by mouth every 8 (eight) hours as needed for nausea or vomiting. Start on the third day after chemotherapy. 30 tablet 1   oxyCODONE (OXY IR/ROXICODONE) 5 MG immediate release tablet Take 1 tablet (5 mg total) by mouth every 4 (four) hours as needed for severe pain (pain score 7-10). 20 tablet 0   polyethylene glycol (MIRALAX) 17 g packet Take 17 g by mouth 2 (two) times daily. 30 each 0   prochlorperazine (COMPAZINE) 10 MG tablet Take 1 tablet (10 mg total) by mouth every 6 (six) hours as needed for nausea or vomiting. 30 tablet 1   No current facility-administered medications for this visit.   Facility-Administered Medications Ordered in Other Visits  Medication Dose Route Frequency Provider Last Rate Last Admin   0.9 %  sodium chloride infusion   Intravenous Continuous [name], MD 10 mL/hr at 10/07/23 1010 New Bag at 10/07/23 1010   bevacizumab-adcd (VEGZELMA) 250 mg in sodium chloride 0.9 % 100 mL chemo infusion  5 mg/kg (Treatment Plan Recorded) Intravenous Once [name], MD       dextrose 5 % solution   Intravenous Continuous [name], MD       fluorouracil (ADRUCIL) 3,500 mg in sodium chloride 0.9 % 80 mL chemo infusion  2,400 mg/m2 (Treatment Plan Recorded) Intravenous 1 day or 1 dose [name], MD       fluorouracil (ADRUCIL) chemo injection 600 mg  400 mg/m2 (Treatment Plan Recorded) Intravenous Once [name], MD       leucovorin 600 mg in dextrose 5 % 250 mL infusion  400 mg/m2 (Treatment Plan Recorded) Intravenous Once [name], MD       oxaliplatin (ELOXATIN) 130 mg in dextrose 5 % 500 mL chemo infusion  85 mg/m2 (Treatment Plan Recorded) Intravenous Once [name], MD        PHYSICAL EXAMINATION: ECOG PERFORMANCE STATUS: 1 - Symptomatic but completely ambulatory  Vitals:   10/07/23  0856  BP: 104/82  Pulse: 84  Resp: 16  Temp: 97.6 F (36.4 C)  SpO2: 96%   Wt Readings from Last 3 Encounters:  10/07/23 115 lb 14.4 oz (52.6 kg)  10/01/23 111 lb 14.4 oz (50.8 kg)  09/17/23 111 lb 3.2 oz (50.4 kg)     GENERAL:alert, no distress and comfortable SKIN: skin color, texture, turgor are normal, no rashes or significant lesions EYES: normal, Conjunctiva are pink and non-injected, sclera clear NECK: supple, thyroid normal size, non-tender, without nodularity LYMPH:  no palpable lymphadenopathy in the cervical, axillary  LUNGS: clear to auscultation and percussion with normal breathing effort HEART: regular rate & rhythm and no murmurs and no lower extremity edema ABDOMEN:abdomen soft, non-tender and normal bowel sounds Musculoskeletal:no cyanosis of digits and no clubbing  NEURO: alert & oriented x 3 with fluent speech, no focal motor/sensory deficits  LABORATORY DATA:  I have reviewed the data as listed    Latest Ref Rng & Units 10/07/2023    8:35 AM 10/01/2023    9:53 AM 09/19/2023    6:20 AM  CBC  WBC 4.0 - 10.5 K/uL 5.4  6.1  9.4   Hemoglobin 12.0 - 15.0 g/dL 16.1  09.6  04.5   Hematocrit 36.0 - 46.0 % 33.8  33.7  37.5   Platelets 150 - 400 K/uL 328  256  233         Latest Ref Rng & Units 10/07/2023    8:35 AM 10/01/2023    9:53 AM 09/19/2023    6:20 AM  CMP  Glucose 70 - 99 mg/dL 409  99  99   BUN 6 - 20 mg/dL 12  12  10    Creatinine 0.44 - 1.00 mg/dL 8.11  9.14  7.82   Sodium 135 - 145 mmol/L 138  139  139   Potassium 3.5 - 5.1 mmol/L 3.9  3.8  3.5   Chloride 98 - 111 mmol/L 106  107  106   CO2 22 - 32 mmol/L 26  24  21    Calcium 8.9 - 10.3 mg/dL 9.2  9.3  9.2   Total Protein 6.5 - 8.1 g/dL 7.3  7.0    Total Bilirubin 0.0 - 1.2 mg/dL 0.4  0.3    Alkaline Phos 38 - 126 U/L 71  65    AST 15 - 41 U/L 16  14    ALT 0 - 44 U/L 11  7        RADIOGRAPHIC STUDIES: I have personally reviewed the radiological images as listed and agreed with the  findings in the report. IR Port Repair Central Venous Access Device Result Date: 10/06/2023 INDICATION: 53 year old female with history of colon cancer status post right internal jugular Port-A-Cath placement on 07/31/2023 with the catheter tip malposition in the azygous vein. The patient presents for revision. EXAM: 1. Ultrasound-guided vascular access of the right common femoral vein. 2. Port catheter tip revision 3. FLUOROSCOPIC GUIDED PORT A CATHETER CHECK MEDICATIONS: None. CONTRAST:  5 mL Omnipaque 300, intravenous FLUOROSCOPY TIME:  Six mGy COMPLICATIONS: None immediate. TECHNIQUE: The procedure, risks, benefits, and alternatives were explained to the patient and informed written consent was obtained. A timeout was performed prior to the initiation of the procedure. The indwelling port and right coronary prepped and draped in standard fashion. Preprocedure ultrasound evaluation demonstrated patency of the right common femoral vein. The procedure was planned. Subdermal Local anesthesia was administered with 1% lidocaine. A small skin nick was made. Under direct ultrasound visualization, the right common femoral vein was accessed with a 21 gauge micropuncture needle. A permanent ultrasound image was captured and stored in the record. A micropuncture sheath was then introduced through which a Rosen wire was directed under fluoroscopic guidance to the right internal jugular vein. The micropuncture sheath was exchanged for a 5 [place] vascular sheath through which a 5 [place] omni Flush catheter was advanced to the superior vena cava, superior to the azygous vein. The wire was retracted and the Omni Flush catheter was used to engage the catheter tip, pull it from the azygous vein, and reposition it with the tip near the cavoatrial junction. The patient's chest port a catheter was accessed. Contrast was injected via the Port a catheter and images were reviewed. The Port a catheter was flushed with a heparin dwell  and de accessed. The catheter and sheath were  removed from the right groin. Hemostasis was achieved with brief manual compression. A dressing was placed. The patient tolerated the procedure well without immediate postprocedural complication. FINDINGS: Malpositioned Port-A-Cath tip positioned within the azygous vein. IMPRESSION: Technically successful Port-A-Cath tip repositioning, now near the cavoatrial junction. The catheter functions well and is ready for continued use. Marliss Coots, MD [facility] Radiology Electronically Signed   By: Marliss Coots M.D.   On: 10/06/2023 16:25      No orders of the defined types were placed in this encounter.  Patient and her daughter had many questions today.  All questions were answered. The patient knows to call the clinic with any problems, questions or concerns. No barriers to learning was detected. The total time spent in the appointment was 40 minutes.     [name], MD 10/07/2023

## 2023-10-08 ENCOUNTER — Inpatient Hospital Stay

## 2023-10-09 ENCOUNTER — Encounter: Payer: Self-pay | Admitting: Nurse Practitioner

## 2023-10-09 ENCOUNTER — Inpatient Hospital Stay (HOSPITAL_BASED_OUTPATIENT_CLINIC_OR_DEPARTMENT_OTHER): Admitting: Nurse Practitioner

## 2023-10-09 ENCOUNTER — Inpatient Hospital Stay

## 2023-10-09 VITALS — BP 132/95 | HR 80 | Temp 98.4°F | Resp 17

## 2023-10-09 DIAGNOSIS — C772 Secondary and unspecified malignant neoplasm of intra-abdominal lymph nodes: Secondary | ICD-10-CM

## 2023-10-09 DIAGNOSIS — G4709 Other insomnia: Secondary | ICD-10-CM | POA: Diagnosis not present

## 2023-10-09 DIAGNOSIS — G893 Neoplasm related pain (acute) (chronic): Secondary | ICD-10-CM

## 2023-10-09 DIAGNOSIS — F419 Anxiety disorder, unspecified: Secondary | ICD-10-CM

## 2023-10-09 DIAGNOSIS — C189 Malignant neoplasm of colon, unspecified: Secondary | ICD-10-CM | POA: Diagnosis not present

## 2023-10-09 DIAGNOSIS — Z5112 Encounter for antineoplastic immunotherapy: Secondary | ICD-10-CM | POA: Diagnosis not present

## 2023-10-09 DIAGNOSIS — Z515 Encounter for palliative care: Secondary | ICD-10-CM

## 2023-10-09 DIAGNOSIS — R530 Neoplastic (malignant) related fatigue: Secondary | ICD-10-CM

## 2023-10-09 DIAGNOSIS — K5903 Drug induced constipation: Secondary | ICD-10-CM

## 2023-10-09 DIAGNOSIS — Z95828 Presence of other vascular implants and grafts: Secondary | ICD-10-CM

## 2023-10-09 MED ORDER — HEPARIN SOD (PORK) LOCK FLUSH 100 UNIT/ML IV SOLN
500.0000 [IU] | Freq: Once | INTRAVENOUS | Status: AC
  Administered 2023-10-09: 500 [IU]

## 2023-10-09 MED ORDER — SODIUM CHLORIDE 0.9% FLUSH
10.0000 mL | Freq: Once | INTRAVENOUS | Status: AC
  Administered 2023-10-09: 10 mL

## 2023-10-09 MED ORDER — OXYCODONE HCL 5 MG PO TABS: 5.0000 mg | ORAL_TABLET | ORAL | 0 refills | Status: DC | PRN

## 2023-10-09 MED ORDER — LORAZEPAM 0.5 MG PO TABS: 0.5000 mg | ORAL_TABLET | Freq: Four times a day (QID) | ORAL | 0 refills | Status: DC | PRN

## 2023-10-09 MED ORDER — SENNOSIDES-DOCUSATE SODIUM 8.6-50 MG PO TABS: 1.0000 | ORAL_TABLET | Freq: Every day | ORAL | 3 refills | Status: DC

## 2023-10-09 NOTE — Progress Notes (Unsigned)
 Palliative Medicine [facility]  [phone]) [phone] Fax:(336) [phone]   Name: Carol Mack Date: 10/09/2023 MRN: [id number]  DOB: 09/24/1970  Patient Care Team: [name], NP as PCP - General (Internal Medicine) [name], MD as Consulting Physician (Hematology and Oncology) [name], MD as Consulting Physician (General Surgery) [name], MD as Consulting Physician (Gastroenterology)    REASON FOR CONSULTATION: Carol Mack is a 53 y.o. female with oncologic medical history including metastatic colon cancer with liver involvement.  Palliative is seeing patient for symptom management and goals of care.    SOCIAL HISTORY:     reports that she has quit smoking. Her smoking use included cigarettes. She started smoking about 29 years ago. She has a 29.2 pack-year smoking history. She has been exposed to tobacco smoke. She has never used smokeless tobacco. She reports that she does not drink alcohol and does not use drugs.  ADVANCE DIRECTIVES:  None on file   CODE STATUS: Full code  PAST MEDICAL HISTORY: Past Medical History:  Diagnosis Date   Anemia    Asthma    Bartholin cyst    Chiari malformation type I (HCC)    Hernia, inguinal    Hernia, inguinal    Hypertension    ILD (interstitial lung disease) (HCC)     PAST SURGICAL HISTORY:  Past Surgical History:  Procedure Laterality Date   fibroid freezing     unsure   IR GENERIC HISTORICAL  07/27/2014   IR RADIOLOGIST EVAL & MGMT 07/27/2014 [name], MD [facility] INTERV RAD   IR PORT REPAIR CENTRAL VENOUS ACCESS DEVICE  10/06/2023   PORTACATH PLACEMENT N/A 07/31/2023   Procedure: PLACEMENT OF PORT A CATHETER UNDER ULTRASOUND AND FLUOROSCOPIC GUIDANCE;  Surgeon: Karie Soda, MD;  Location: [facility];  Service: General;  Laterality: N/A;  GEN WITH ERAS 1.5 HOURS TOTAL   ROBOTIC ASSISTED TOTAL HYSTERECTOMY N/A 09/15/2018   Procedure: XI ROBOTIC ASSISTED TOTAL HYSTERECTOMY WITH  SALPINGECTOMY;  Surgeon: Willodean Rosenthal, MD;  Location: [facility];  Service: Gynecology;  Laterality: N/A;    HEMATOLOGY/ONCOLOGY HISTORY:  Oncology History  Colon cancer metastasized to mesenteric lymph nodes (HCC)  06/26/2023 Cancer Staging   Staging form: Colon and Rectum, AJCC 8th Edition - Pathologic stage from 06/26/2023: Stage IVC (pT4a, pN1b, pM1c) - Signed by [name], MD on 07/25/2023 Residual tumor (R): R1 - Microscopic   07/25/2023 Initial Diagnosis   Colon cancer metastasized to mesenteric lymph nodes (HCC)   08/06/2023 -  Chemotherapy   Patient is on Treatment Plan : COLORECTAL FOLFOX + Bevacizumab q14d       ALLERGIES:  is allergic to hydrocodone.  MEDICATIONS:  Current Outpatient Medications  Medication Sig Dispense Refill   senna-docusate (SENNA S) 8.6-50 MG tablet Take 1 tablet by mouth daily. 60 tablet 3   albuterol (VENTOLIN HFA) 108 (90 Base) MCG/ACT inhaler Inhale 1-2 puffs into the lungs every 6 (six) hours as needed for shortness of breath. 2 each 1   amLODipine (NORVASC) 10 MG tablet Take 1 tablet (10 mg total) by mouth daily. 30 tablet 2   ferrous sulfate 325 (65 FE) MG tablet Take 1 tablet (325 mg total) by mouth daily with breakfast. 90 tablet 0   lidocaine-prilocaine (EMLA) cream Apply to affected area once (Patient taking differently: Apply 1 Application topically as needed (for port access).) 30 g 3   LORazepam (ATIVAN) 0.5 MG tablet Take 1 tablet (0.5 mg total) by mouth every  6 (six) hours as needed for anxiety or sleep. 45 tablet 0   mirtazapine (REMERON) 7.5 MG tablet Take 1 tablet (7.5 mg total) by mouth at bedtime. 30 tablet 0   ondansetron (ZOFRAN) 8 MG tablet Take 1 tablet (8 mg total) by mouth every 8 (eight) hours as needed for nausea or vomiting. Start on the third day after chemotherapy. 30 tablet 1   oxyCODONE (OXY IR/ROXICODONE) 5 MG immediate release tablet Take 1 tablet (5 mg total) by mouth every 4 (four) hours as  needed for severe pain (pain score 7-10). 60 tablet 0   pantoprazole (PROTONIX) 40 MG tablet Take 1 tablet (40 mg total) by mouth daily. 30 tablet 0   polyethylene glycol (MIRALAX) 17 g packet Take 17 g by mouth 2 (two) times daily. 30 each 0   prochlorperazine (COMPAZINE) 10 MG tablet Take 1 tablet (10 mg total) by mouth every 6 (six) hours as needed for nausea or vomiting. 30 tablet 1   No current facility-administered medications for this visit.    VITAL SIGNS: LMP  (LMP Unknown)  There were no vitals filed for this visit.  Estimated body mass index is 19.89 kg/m as calculated from the following:   Height as of 10/07/23: 5\' 4"  (1.626 m).   Weight as of 10/07/23: 115 lb 14.4 oz (52.6 kg).  LABS: CBC:    Component Value Date/Time   WBC 5.4 10/07/2023 0835   WBC 9.4 09/19/2023 0620   HGB 11.5 (L) 10/07/2023 0835   HCT 33.8 (L) 10/07/2023 0835   PLT 328 10/07/2023 0835   MCV 66.7 (L) 10/07/2023 0835   NEUTROABS 1.2 (L) 10/07/2023 0835   LYMPHSABS 2.9 10/07/2023 0835   MONOABS 1.1 (H) 10/07/2023 0835   EOSABS 0.1 10/07/2023 0835   BASOSABS 0.1 10/07/2023 0835   Comprehensive Metabolic Panel:    Component Value Date/Time   NA 138 10/07/2023 0835   K 3.9 10/07/2023 0835   CL 106 10/07/2023 0835   CO2 26 10/07/2023 0835   BUN 12 10/07/2023 0835   CREATININE 0.58 10/07/2023 0835   GLUCOSE 149 (H) 10/07/2023 0835   CALCIUM 9.2 10/07/2023 0835   AST 16 10/07/2023 0835   ALT 11 10/07/2023 0835   ALKPHOS 71 10/07/2023 0835   BILITOT 0.4 10/07/2023 0835   PROT 7.3 10/07/2023 0835   ALBUMIN 3.7 10/07/2023 0835    RADIOGRAPHIC STUDIES: IR Port Repair Central Venous Access Device Result Date: 10/06/2023 INDICATION: 53 year old female with history of colon cancer status post right internal jugular Port-A-Cath placement on 07/31/2023 with the catheter tip malposition in the azygous vein. The patient presents for revision. EXAM: 1. Ultrasound-guided vascular access of the right  common femoral vein. 2. Port catheter tip revision 3. FLUOROSCOPIC GUIDED PORT A CATHETER CHECK MEDICATIONS: None. CONTRAST:  5 mL Omnipaque 300, intravenous FLUOROSCOPY TIME:  Six mGy COMPLICATIONS: None immediate. TECHNIQUE: The procedure, risks, benefits, and alternatives were explained to the patient and informed written consent was obtained. A timeout was performed prior to the initiation of the procedure. The indwelling port and right coronary prepped and draped in standard fashion. Preprocedure ultrasound evaluation demonstrated patency of the right common femoral vein. The procedure was planned. Subdermal Local anesthesia was administered with 1% lidocaine. A small skin nick was made. Under direct ultrasound visualization, the right common femoral vein was accessed with a 21 gauge micropuncture needle. A permanent ultrasound image was captured and stored in the record. A micropuncture sheath was then introduced through which a  Rosen wire was directed under fluoroscopic guidance to the right internal jugular vein. The micropuncture sheath was exchanged for a 5 [place] vascular sheath through which a 5 [place] omni Flush catheter was advanced to the superior vena cava, superior to the azygous vein. The wire was retracted and the Omni Flush catheter was used to engage the catheter tip, pull it from the azygous vein, and reposition it with the tip near the cavoatrial junction. The patient's chest port a catheter was accessed. Contrast was injected via the Port a catheter and images were reviewed. The Port a catheter was flushed with a heparin dwell and de accessed. The catheter and sheath were removed from the right groin. Hemostasis was achieved with brief manual compression. A dressing was placed. The patient tolerated the procedure well without immediate postprocedural complication. FINDINGS: Malpositioned Port-A-Cath tip positioned within the azygous vein. IMPRESSION: Technically successful Port-A-Cath tip  repositioning, now near the cavoatrial junction. The catheter functions well and is ready for continued use. Marliss Coots, MD [facility] Radiology Electronically Signed   By: Marliss Coots M.D.   On: 10/06/2023 16:25   CT Angio Chest PE W and/or Wo Contrast Result Date: 09/19/2023 CLINICAL DATA:  Chest pain for 12 hours. Colon cancer. Metastatic disease. * Tracking Code: BO * EXAM: CT ANGIOGRAPHY CHEST WITH CONTRAST TECHNIQUE: Multidetector CT imaging of the chest was performed using the standard protocol during bolus administration of intravenous contrast. Multiplanar CT image reconstructions and MIPs were obtained to evaluate the vascular anatomy. RADIATION DOSE REDUCTION: This exam was performed according to the departmental dose-optimization program which includes automated exposure control, adjustment of the mA and/or kV according to patient size and/or use of iterative reconstruction technique. CONTRAST:  75mL OMNIPAQUE IOHEXOL 350 MG/ML SOLN COMPARISON:  CTA 09/05/2023. FINDINGS: Cardiovascular: There is enlargement of the pulmonary arteries. Please correlate for pulmonary artery hypertension. No segmental or larger pulmonary embolism identified. Heart is otherwise nonenlarged. Small pericardial effusion. The thoracic aorta grossly has a normal course and caliber. Right IJ chest port is accessed. Tip of the catheter extends into the azygous vein. This may be repositioned. This was seen previously. Mediastinum/Nodes: Known markedly enlarged thyroid gland left thyroid lobe with mass effect and shift of the trachea from left-to-right. Slightly patulous thoracic esophagus with slight wall thickening. There is anasarca with scattered edema along the mediastinum as well. No specific abnormal node enlargement along the axillary regions or hila. There are some separate mediastinal soft tissue lesions identified such is anterior on the left consistent with lymph node  enlargement measuring up to 4.0 by 2.0 cm on image 54 series 4. Not significant changed when adjusted for technique compared to the recent prior. Lungs/Pleura: Advanced emphysematous lung changes identified. No pneumothorax or consolidation. Mild bandlike opacities dependently along bases. Atelectasis is favored. There is increased from previous. Trace bilateral pleural fluid. Upper Abdomen: Upper abdomen is somewhat limited by technique. Known multiple abnormal nodes suggested. Poor visualization of the liver, likely to the technique in the contrast bolus. Musculoskeletal: Mild degenerative changes seen along the spine. Critical Value/emergent results were called by telephone at the time of interpretation on 09/19/2023 at 2:58 pm to provider [facility] , who verbally acknowledged these results. Review of the MIP images confirms the above findings. IMPRESSION: Enlarged pulmonary arteries consistent with pulmonary hypertension. No segmental or larger pulmonary embolism. Extensive emphysematous lung changes. Right IJ chest port in place. Tip extends into the azygous vein. This may need to be repositioned.  Multinodular goiter.  Persistent abnormal lymph nodes. Extensive anasarca. Limited evaluation of the abdomen and mediastinum Aortic Atherosclerosis (ICD10-I70.0) and Emphysema (ICD10-J43.9). Electronically Signed   By: Karen Kays M.D.   On: 09/19/2023 15:01   DG Chest 2 View Result Date: 09/19/2023 CLINICAL DATA:  53 year old female history of intermittent chest pain for the past 12 hours. EXAM: CHEST - 2 VIEW COMPARISON:  Chest x-ray 09/05/2023. FINDINGS: Right internal jugular single-lumen Port-A-Cath with tip projecting over the expected location of the azygous vein. Lung volumes are normal. No consolidative airspace disease. No pleural effusions. No pneumothorax. No pulmonary nodule or mass noted. Pulmonary vasculature and the cardiomediastinal silhouette are within normal limits. IMPRESSION: 1. Support  apparatus, as above. 2. No radiographic evidence of acute cardiopulmonary disease. Electronically Signed   By: Trudie Reed M.D.   On: 09/19/2023 10:13    PERFORMANCE STATUS (ECOG) : 1 - Symptomatic but completely ambulatory  Review of Systems  Constitutional:  Positive for activity change, appetite change and fatigue.  Gastrointestinal:  Positive for abdominal pain.  Musculoskeletal:  Positive for back pain.  Unless otherwise noted, a complete review of systems is negative.  Physical Exam General: NAD Cardiovascular: regular rate and rhythm Pulmonary: clear ant fields Abdomen: soft, nontender, + bowel sounds Extremities: no edema, no joint deformities Skin: no rashes Neurological: Alert and oriented x3  IMPRESSION: Discussed the use of AI scribe software for clinical note transcription with the patient, who gave verbal consent to proceed. History of Present Illness Carol Mack is a 53 year old female who presents for her initial palliative care visit. She is accompanied by her daughter, Sheral Apley.  Patient is alert and able to engage appropriately in discussions.  I introduced myself, Maygan RN, and Palliative's role in collaboration with the oncology team. Concept of Palliative Care was introduced as specialized medical care for people and their families living with serious illness.  It focuses on providing relief from the symptoms and stress of a serious illness.  The goal is to improve quality of life for both the patient and the family. Values and goals of care important to patient and family were attempted to be elicited.   Patient recently moved in with her daughter due to her health decline. At home she is able to perform most ADLs independently with limitations due to fatigue and pain. She experiences constipation, likely due to her medications and iron supplements. She has been using Miralax to manage this.  Encouraged patient to continue using MiraLAX consistently for her  bowel regimen in addition to discussions around senna S 2 tablets at bedtime.  In the setting of opioid use.  She has been experiencing increased anxiety and pain. Her anxiety has been exacerbated by recent life changes, including moving, and difficulty sleeping. She is currently taking lorazepam 0.5 mg every 12 hours for anxiety, but feels this is not effective. Education provided on increasing to every 6-8 hours as needed.   Her appetite has been variable, with some improvement noted. She occasionally uses protein drinks like Premier Protein and takes ginger and immunity shots to support her nutrition. Her weight has fluctuated, with a recent decrease noted during chemotherapy treatment periods.  Her current weight is 115 pounds.  We discussed plans to continue to closely evaluate.  Education provided on eating small meals throughout the day versus 3 large meals, snacking when hungry, and increasing protein and rich foods.  If patient continues to have feelings early satiety and ongoing weight loss can consider  appetite stimulant.  Ms. Neglia had a recent episode of chest pain that led to an emergency room visit, which she initially attributed to medication side effects, causing her to stop taking pain medication. Her anxiety peaked during this period also. She has since resumed oxycodone 5 mg as needed for pain management.  She is complaining of abdominal and back pain.  Describes the pain as constant and sharp.  Patient reports some relief with oxycodone as needed.  We will continue on current regimen.  No adjustments at this time.  I will continue to closely monitor.  All questions answered and support provided.  I discussed the importance of continued conversation with family and their medical providers regarding overall plan of care and treatment options, ensuring decisions are within the context of the patients values and GOCs. Assessment & Plan Established therapeutic relationship. Education  provided on palliative's role in collaboration with their Oncology/Radiation team.  Cancer related pain management She is now open to oxycodone for pain control - Oxycodone 5 mg every 4 hours as needed for pain. - Monitor for side effects, particularly chest pain, and adjust treatment as necessary.  Anxiety - Adjust lorazepam to 0.5 mg every 6 hours as needed for anxiety. - Evaluate effectiveness of adjusted lorazepam dosage in 7-10 days. - Consider alternative therapies if anxiety persists.  Constipation Constipation likely due to medications and iron supplements. Miralax is insufficient. - Prescribe Senokot, 2 tablets at bedtime. - Advise continued Miralax with meals, at least twice a day.  Appetite and weight management Variable appetite with weight loss during chemotherapy. Discussed maintaining nutrition and potential use of appetite stimulant in the future if no improvement.  - Encourage increased nutritional intake during non-chemotherapy weeks. - Consider protein drinks like Premier Protein to supplement nutrition. - Reassess appetite management strategies at the next visit.  Follow-up Scheduled appointments and tests to monitor condition and adjust treatment. - Schedule follow-up appointment on April 9 with Dr. Mosetta Putt. - Scheduled scan on April 7. - Appointment with social worker Darl Pikes on March 31. -I will plan to see patient back in 2-3 weeks. Sooner if needed.   Patient expressed understanding and was in agreement with this plan. She also understands that She can call the clinic at any time with any questions, concerns, or complaints.   Thank you for your referral and allowing Palliative to assist in Ms. Venissa Saal's care.   Number and complexity of problems addressed: HIGH - 1 or more chronic illnesses with SEVERE exacerbation, progression, or side effects of treatment - advanced cancer, pain. Any controlled substances utilized were prescribed in the context of  palliative care.  Visit consisted of counseling and education dealing with the complex and emotionally intense issues of symptom management and palliative care in the setting of serious and potentially life-threatening illness.  Signed by: Willette Alma, AGPCNP-BC Palliative Medicine Team/[facility]

## 2023-10-10 ENCOUNTER — Encounter: Payer: Self-pay | Admitting: Hematology

## 2023-10-13 ENCOUNTER — Inpatient Hospital Stay: Admitting: Licensed Clinical Social Worker

## 2023-10-13 DIAGNOSIS — C189 Malignant neoplasm of colon, unspecified: Secondary | ICD-10-CM

## 2023-10-13 NOTE — Progress Notes (Signed)
 Follow up call to Carol Mack regarding her Port placement on 10/06/23. Carol Mack reports she is doing well, she states she had some minor soreness but that has since subsided. Carol Mack also states she has received chemo since her port placement and it worked great. I advised Carol Mack to call the clinic if any questions or concerns arise, she verbalized understanding.

## 2023-10-14 ENCOUNTER — Encounter: Payer: Self-pay | Admitting: Hematology

## 2023-10-14 NOTE — Progress Notes (Signed)
 [facility] CSW Progress Note  Clinical Child psychotherapist contacted patient by phone to check in.  Pt referred due to significant anxiety w/ a change in medication regiment last week.  Pt reports taking medication as prescribed.  At this time pt is not experiencing a significant difference in anxiety.  CSW discussed at length w/ pt triggers for anxiety.  Pt's life has changed significantly since diagnosis including the necessity to stop working as well as moving in with her daughter.  Pt does not have an income at this time which is causing a great deal of distress.  Pt has been approved for Medicaid, but states she does not know if she was applied for SSI.  CSW sent a message to Julianne Handler to check if pt has an application pending for SSI.  Pt also inquiring if any additional documentation needs to be submitted for her rent payment in April.  Message sent to Orbie Hurst to follow up w/ pt.  A referral for the [facility] will be signed by pt at next infusion to be submitted on her behalf to apply for SSDI.  CSW to continue to provide support to pt as appropriate throughout duration of treatment.      Rachel Moulds, LCSW Clinical Social Worker [facility]    Patient is participating in a Managed Medicaid Plan:  Yes

## 2023-10-15 ENCOUNTER — Encounter: Payer: Self-pay | Admitting: Hematology

## 2023-10-15 ENCOUNTER — Ambulatory Visit: Payer: Medicaid Other | Admitting: Nurse Practitioner

## 2023-10-15 ENCOUNTER — Other Ambulatory Visit: Payer: Medicaid Other

## 2023-10-15 ENCOUNTER — Inpatient Hospital Stay: Attending: Hematology | Admitting: Licensed Clinical Social Worker

## 2023-10-15 ENCOUNTER — Ambulatory Visit: Payer: Medicaid Other

## 2023-10-15 DIAGNOSIS — Z79899 Other long term (current) drug therapy: Secondary | ICD-10-CM | POA: Insufficient documentation

## 2023-10-15 DIAGNOSIS — N898 Other specified noninflammatory disorders of vagina: Secondary | ICD-10-CM | POA: Insufficient documentation

## 2023-10-15 DIAGNOSIS — Z5112 Encounter for antineoplastic immunotherapy: Secondary | ICD-10-CM | POA: Insufficient documentation

## 2023-10-15 DIAGNOSIS — G893 Neoplasm related pain (acute) (chronic): Secondary | ICD-10-CM | POA: Insufficient documentation

## 2023-10-15 DIAGNOSIS — G629 Polyneuropathy, unspecified: Secondary | ICD-10-CM | POA: Insufficient documentation

## 2023-10-15 DIAGNOSIS — Z5111 Encounter for antineoplastic chemotherapy: Secondary | ICD-10-CM | POA: Insufficient documentation

## 2023-10-15 DIAGNOSIS — C772 Secondary and unspecified malignant neoplasm of intra-abdominal lymph nodes: Secondary | ICD-10-CM | POA: Insufficient documentation

## 2023-10-15 DIAGNOSIS — C189 Malignant neoplasm of colon, unspecified: Secondary | ICD-10-CM | POA: Insufficient documentation

## 2023-10-15 NOTE — Progress Notes (Signed)
 Called patient after receiving message regarding her rent expense to be submitted from grant. Discussed our previous discussion to clarify at this point what she needed me to do to assist her. Submitted expense to be prepared for payment to be processed and entered note for social worker to be called when check is ready to be picked up and she would be contacted when available for her to pickup.  She has my card for any additional financial questions or concerns.

## 2023-10-15 NOTE — Progress Notes (Signed)
 [facility] CSW Progress Note  Visual merchandiser  received an Agricultural engineer from [place] at the [employer] confirming pt's Medicaid disability claim is still under review.  Joi suggested the patient directly contact DSS to see if any additional documentation is needed as she is not able to access this information.  CSW contacted pt and relayed the above information.  CSW informed by pt she had spoken w/ [place] regarding payment of her rent.  Per pt she was informed the check would be picked up by CSW and pt is expecting to come to the [facility] to get the check when it is ready.  Email sent to Blake Divine and Polo Riley regarding rent payment.        Rachel Moulds, LCSW Clinical Social Worker [facility]    Patient is participating in a Managed Medicaid Plan:  Yes

## 2023-10-16 ENCOUNTER — Telehealth (INDEPENDENT_AMBULATORY_CARE_PROVIDER_SITE_OTHER): Payer: Self-pay | Admitting: Primary Care

## 2023-10-16 ENCOUNTER — Inpatient Hospital Stay: Admitting: Licensed Clinical Social Worker

## 2023-10-16 DIAGNOSIS — C189 Malignant neoplasm of colon, unspecified: Secondary | ICD-10-CM

## 2023-10-16 DIAGNOSIS — C772 Secondary and unspecified malignant neoplasm of intra-abdominal lymph nodes: Secondary | ICD-10-CM

## 2023-10-16 NOTE — Progress Notes (Signed)
 [facility] CSW Progress Note  Clinical Child psychotherapist  received a call from pt requesting a copy of the letter drafted on 3/20 be scanned and emailed to her as she has misplaced the original.  Letter emailed to pt.  CSW to remain available as appropriate to provide support throughout duration of treatment.         Rachel Moulds, LCSW Clinical Social Worker [facility]    Patient is participating in a Managed Medicaid Plan:  Yes

## 2023-10-16 NOTE — Telephone Encounter (Signed)
 Left VM with pt about their upcoming appt.

## 2023-10-17 ENCOUNTER — Ambulatory Visit (INDEPENDENT_AMBULATORY_CARE_PROVIDER_SITE_OTHER): Admitting: Primary Care

## 2023-10-17 ENCOUNTER — Inpatient Hospital Stay: Admitting: Licensed Clinical Social Worker

## 2023-10-17 DIAGNOSIS — C189 Malignant neoplasm of colon, unspecified: Secondary | ICD-10-CM

## 2023-10-20 ENCOUNTER — Encounter: Payer: Self-pay | Admitting: Hematology

## 2023-10-20 ENCOUNTER — Ambulatory Visit (HOSPITAL_COMMUNITY)
Admission: RE | Admit: 2023-10-20 | Discharge: 2023-10-20 | Disposition: A | Discharge status: Home / Self Care | Source: Ambulatory Visit | Attending: Hematology | Admitting: Hematology

## 2023-10-20 DIAGNOSIS — C189 Malignant neoplasm of colon, unspecified: Secondary | ICD-10-CM | POA: Insufficient documentation

## 2023-10-20 DIAGNOSIS — C772 Secondary and unspecified malignant neoplasm of intra-abdominal lymph nodes: Secondary | ICD-10-CM | POA: Diagnosis present

## 2023-10-20 MED ORDER — SODIUM CHLORIDE (PF) 0.9 % IJ SOLN
INTRAMUSCULAR | Status: AC
  Filled 2023-10-20: qty 50

## 2023-10-20 MED ORDER — IOHEXOL 9 MG/ML PO SOLN
ORAL | Status: AC
  Filled 2023-10-20: qty 1000

## 2023-10-20 MED ORDER — IOHEXOL 9 MG/ML PO SOLN
1000.0000 mL | ORAL | Status: AC
  Administered 2023-10-20: 1000 mL via ORAL

## 2023-10-20 MED ORDER — IOHEXOL 300 MG/ML  SOLN
75.0000 mL | Freq: Once | INTRAMUSCULAR | Status: AC | PRN
  Administered 2023-10-20: 100 mL via INTRAVENOUS

## 2023-10-20 NOTE — Progress Notes (Signed)
 [facility] CSW Progress Note  Visual merchandiser  met w/ pt's daughter to deliver a check to be applied toward's pt's rent.  Pt is utilizing funds from the [employer].  CSW to remain available as appropriate throughout duration of treatment.        Rachel Moulds, LCSW Clinical Social Worker [facility]    Patient is participating in a Managed Medicaid Plan:  Yes

## 2023-10-22 ENCOUNTER — Encounter: Payer: Self-pay | Admitting: Nurse Practitioner

## 2023-10-22 ENCOUNTER — Inpatient Hospital Stay (HOSPITAL_BASED_OUTPATIENT_CLINIC_OR_DEPARTMENT_OTHER): Admitting: Nurse Practitioner

## 2023-10-22 ENCOUNTER — Inpatient Hospital Stay

## 2023-10-22 ENCOUNTER — Encounter: Payer: Self-pay | Admitting: Hematology

## 2023-10-22 ENCOUNTER — Inpatient Hospital Stay (HOSPITAL_BASED_OUTPATIENT_CLINIC_OR_DEPARTMENT_OTHER): Admitting: Hematology

## 2023-10-22 VITALS — BP 128/80 | HR 78 | Temp 98.0°F | Resp 22 | Ht 64.0 in | Wt 117.2 lb

## 2023-10-22 DIAGNOSIS — Z5111 Encounter for antineoplastic chemotherapy: Secondary | ICD-10-CM | POA: Diagnosis present

## 2023-10-22 DIAGNOSIS — C189 Malignant neoplasm of colon, unspecified: Secondary | ICD-10-CM

## 2023-10-22 DIAGNOSIS — G893 Neoplasm related pain (acute) (chronic): Secondary | ICD-10-CM

## 2023-10-22 DIAGNOSIS — Z79899 Other long term (current) drug therapy: Secondary | ICD-10-CM | POA: Diagnosis not present

## 2023-10-22 DIAGNOSIS — C772 Secondary and unspecified malignant neoplasm of intra-abdominal lymph nodes: Secondary | ICD-10-CM

## 2023-10-22 DIAGNOSIS — R630 Anorexia: Secondary | ICD-10-CM | POA: Diagnosis not present

## 2023-10-22 DIAGNOSIS — R634 Abnormal weight loss: Secondary | ICD-10-CM

## 2023-10-22 DIAGNOSIS — R530 Neoplastic (malignant) related fatigue: Secondary | ICD-10-CM

## 2023-10-22 DIAGNOSIS — Z5112 Encounter for antineoplastic immunotherapy: Secondary | ICD-10-CM | POA: Diagnosis present

## 2023-10-22 DIAGNOSIS — G629 Polyneuropathy, unspecified: Secondary | ICD-10-CM | POA: Diagnosis not present

## 2023-10-22 DIAGNOSIS — Z515 Encounter for palliative care: Secondary | ICD-10-CM

## 2023-10-22 DIAGNOSIS — F419 Anxiety disorder, unspecified: Secondary | ICD-10-CM

## 2023-10-22 DIAGNOSIS — Z95828 Presence of other vascular implants and grafts: Secondary | ICD-10-CM

## 2023-10-22 DIAGNOSIS — N898 Other specified noninflammatory disorders of vagina: Secondary | ICD-10-CM | POA: Diagnosis not present

## 2023-10-22 LAB — CMP (CANCER CENTER ONLY)
ALT: 7 U/L (ref 0–44)
AST: 14 U/L — ABNORMAL LOW (ref 15–41)
Albumin: 4 g/dL (ref 3.5–5.0)
Alkaline Phosphatase: 59 U/L (ref 38–126)
Anion gap: 5 (ref 5–15)
BUN: 10 mg/dL (ref 6–20)
CO2: 30 mmol/L (ref 22–32)
Calcium: 9.6 mg/dL (ref 8.9–10.3)
Chloride: 104 mmol/L (ref 98–111)
Creatinine: 0.59 mg/dL (ref 0.44–1.00)
GFR, Estimated: >60 mL/min (ref >60)
Glucose, Bld: 80 mg/dL (ref 70–99)
Potassium: 4.2 mmol/L (ref 3.5–5.1)
Sodium: 139 mmol/L (ref 135–145)
Total Bilirubin: 0.4 mg/dL (ref 0.0–1.2)
Total Protein: 7.5 g/dL (ref 6.5–8.1)

## 2023-10-22 LAB — TOTAL PROTEIN, URINE DIPSTICK: Protein, ur: NEGATIVE mg/dL

## 2023-10-22 LAB — CBC WITH DIFFERENTIAL (CANCER CENTER ONLY)
Abs Immature Granulocytes: 0 10*3/uL (ref 0.00–0.07)
Basophils Absolute: 0 10*3/uL (ref 0.0–0.1)
Basophils Relative: 1 %
Eosinophils Absolute: 0.1 10*3/uL (ref 0.0–0.5)
Eosinophils Relative: 2 %
HCT: 34.1 % — ABNORMAL LOW (ref 36.0–46.0)
Hemoglobin: 12 g/dL (ref 12.0–15.0)
Immature Granulocytes: 0 %
Lymphocytes Relative: 47 %
Lymphs Abs: 2.7 10*3/uL (ref 0.7–4.0)
MCH: 23 pg — ABNORMAL LOW (ref 26.0–34.0)
MCHC: 35.2 g/dL (ref 30.0–36.0)
MCV: 65.3 fL — ABNORMAL LOW (ref 80.0–100.0)
Monocytes Absolute: 1 10*3/uL (ref 0.1–1.0)
Monocytes Relative: 17 %
Neutro Abs: 1.9 10*3/uL (ref 1.7–7.7)
Neutrophils Relative %: 33 %
Platelet Count: 246 10*3/uL (ref 150–400)
RBC: 5.22 MIL/uL — ABNORMAL HIGH (ref 3.87–5.11)
RDW: 22.8 % — ABNORMAL HIGH (ref 11.5–15.5)
WBC Count: 5.7 10*3/uL (ref 4.0–10.5)
nRBC: 0 % (ref 0.0–0.2)

## 2023-10-22 MED ORDER — OXALIPLATIN CHEMO INJECTION 100 MG/20ML
85.0000 mg/m2 | Freq: Once | INTRAVENOUS | Status: AC
  Administered 2023-10-22: 130 mg via INTRAVENOUS
  Filled 2023-10-22: qty 6

## 2023-10-22 MED ORDER — SODIUM CHLORIDE 0.9 % IV SOLN: INTRAVENOUS | Status: DC

## 2023-10-22 MED ORDER — FLUOROURACIL CHEMO INJECTION 2.5 GM/50ML
400.0000 mg/m2 | Freq: Once | INTRAVENOUS | Status: AC
  Administered 2023-10-22: 600 mg via INTRAVENOUS
  Filled 2023-10-22: qty 12

## 2023-10-22 MED ORDER — LEUCOVORIN CALCIUM INJECTION 350 MG
400.0000 mg/m2 | Freq: Once | INTRAVENOUS | Status: AC
  Administered 2023-10-22: 600 mg via INTRAVENOUS
  Filled 2023-10-22: qty 30

## 2023-10-22 MED ORDER — PALONOSETRON HCL INJECTION 0.25 MG/5ML
0.2500 mg | Freq: Once | INTRAVENOUS | Status: AC
  Administered 2023-10-22: 0.25 mg via INTRAVENOUS
  Filled 2023-10-22: qty 5

## 2023-10-22 MED ORDER — SODIUM CHLORIDE 0.9 % IV SOLN
2400.0000 mg/m2 | INTRAVENOUS | Status: DC
  Administered 2023-10-22: 3500 mg via INTRAVENOUS
  Filled 2023-10-22: qty 70

## 2023-10-22 MED ORDER — LORAZEPAM 1 MG PO TABS
0.5000 mg | ORAL_TABLET | Freq: Once | ORAL | Status: AC
  Administered 2023-10-22: 0.5 mg via ORAL
  Filled 2023-10-22: qty 1

## 2023-10-22 MED ORDER — DEXTROSE 5 % IV SOLN: INTRAVENOUS | Status: DC

## 2023-10-22 MED ORDER — SODIUM CHLORIDE 0.9 % IV SOLN
5.0000 mg/kg | Freq: Once | INTRAVENOUS | Status: AC
  Administered 2023-10-22: 250 mg via INTRAVENOUS
  Filled 2023-10-22: qty 10

## 2023-10-22 MED ORDER — DEXAMETHASONE SODIUM PHOSPHATE 10 MG/ML IJ SOLN
10.0000 mg | Freq: Once | INTRAMUSCULAR | Status: AC
  Administered 2023-10-22: 10 mg via INTRAVENOUS
  Filled 2023-10-22: qty 1

## 2023-10-22 MED ORDER — DRONABINOL 2.5 MG PO CAPS: 5.0000 mg | ORAL_CAPSULE | Freq: Three times a day (TID) | ORAL | 1 refills | Status: DC

## 2023-10-22 MED ORDER — SODIUM CHLORIDE 0.9% FLUSH
10.0000 mL | Freq: Once | INTRAVENOUS | Status: AC
Start: 2023-10-22 — End: 2023-10-22
  Administered 2023-10-22: 10 mL

## 2023-10-22 NOTE — Progress Notes (Signed)
 Palliative Medicine [facility]  [phone] Fax:(336) [phone]   Name: Carol Mack Date: 10/22/2023 MRN: [id number]  DOB: [birth year]  Patient Care Team: Carol Sessions, NP as PCP - General (Internal Medicine) Carol Mood, MD as Consulting Physician (Hematology and Oncology) Carol Levee, MD as Consulting Physician (General Surgery) Carol Lucks, MD as Consulting Physician (Gastroenterology) Carol Mack, [name], NP as Nurse Practitioner (Hospice and Palliative Medicine)    INTERVAL HISTORY: Carol Mack is a 53 y.o. female with oncologic medical history including metastatic colon cancer with liver involvement.  Palliative is seeing patient for symptom management and goals of care.   SOCIAL HISTORY:     reports that she has quit smoking. Her smoking use included cigarettes. She started smoking about 29 years ago. She has a 29.3 pack-year smoking history. She has been exposed to tobacco smoke. She has never used smokeless tobacco. She reports that she does not drink alcohol and does not use drugs.  ADVANCE DIRECTIVES:  None on file   CODE STATUS: Full code  PAST MEDICAL HISTORY: Past Medical History:  Diagnosis Date   Anemia    Asthma    Bartholin cyst    Chiari malformation type I (HCC)    Hernia, inguinal    Hernia, inguinal    Hypertension    ILD (interstitial lung disease) (HCC)     ALLERGIES:  is allergic to hydrocodone.  MEDICATIONS:  Current Outpatient Medications  Medication Sig Dispense Refill   dronabinol (MARINOL) 2.5 MG capsule Take 2 capsules (5 mg total) by mouth 3 (three) times daily before meals. 90 capsule 1   albuterol (VENTOLIN HFA) 108 (90 Base) MCG/ACT inhaler Inhale 1-2 puffs into the lungs every 6 (six) hours as needed for shortness of breath. 2 each 1   amLODipine (NORVASC) 10 MG tablet Take 1 tablet (10 mg total) by mouth daily. 30 tablet 2   ferrous sulfate 325 (65 FE) MG tablet Take 1  tablet (325 mg total) by mouth daily with breakfast. 90 tablet 0   lidocaine-prilocaine (EMLA) cream Apply to affected area once (Patient taking differently: Apply 1 Application topically as needed (for port access).) 30 g 3   LORazepam (ATIVAN) 0.5 MG tablet Take 1 tablet (0.5 mg total) by mouth every 6 (six) hours as needed for anxiety or sleep. 45 tablet 0   mirtazapine (REMERON) 7.5 MG tablet Take 1 tablet (7.5 mg total) by mouth at bedtime. 30 tablet 0   ondansetron (ZOFRAN) 8 MG tablet Take 1 tablet (8 mg total) by mouth every 8 (eight) hours as needed for nausea or vomiting. Start on the third day after chemotherapy. 30 tablet 1   oxyCODONE (OXY IR/ROXICODONE) 5 MG immediate release tablet Take 1 tablet (5 mg total) by mouth every 4 (four) hours as needed for severe pain (pain score 7-10). 60 tablet 0   pantoprazole (PROTONIX) 40 MG tablet Take 1 tablet (40 mg total) by mouth daily. 30 tablet 0   polyethylene glycol (MIRALAX) 17 g packet Take 17 g by mouth 2 (two) times daily. 30 each 0   prochlorperazine (COMPAZINE) 10 MG tablet Take 1 tablet (10 mg total) by mouth every 6 (six) hours as needed for nausea or vomiting. 30 tablet 1   senna-docusate (SENNA S) 8.6-50 MG tablet Take 1 tablet by mouth daily. 60 tablet 3   No current facility-administered medications for this visit.   Facility-Administered Medications Ordered in Other Visits  Medication Dose Route Frequency Provider  Last Rate Last Admin   0.9 %  sodium chloride infusion   Intravenous Continuous Carol Mood, MD 10 mL/hr at 10/22/23 1333 New Bag at 10/22/23 1333   dextrose 5 % solution   Intravenous Continuous Carol Mood, MD 10 mL/hr at 10/22/23 1445 New Bag at 10/22/23 1445   fluorouracil (ADRUCIL) 3,500 mg in sodium chloride 0.9 % 80 mL chemo infusion  2,400 mg/m2 (Treatment Plan Recorded) Intravenous 1 day or 1 dose Carol Mood, MD       fluorouracil (ADRUCIL) chemo injection 600 mg  400 mg/m2 (Treatment Plan Recorded) Intravenous  Once Carol Mood, MD       leucovorin 600 mg in dextrose 5 % 250 mL infusion  400 mg/m2 (Treatment Plan Recorded) Intravenous Once Carol Mood, MD 140 mL/hr at 10/22/23 1445 600 mg at 10/22/23 1445   oxaliplatin (ELOXATIN) 130 mg in dextrose 5 % 500 mL chemo infusion  85 mg/m2 (Treatment Plan Recorded) Intravenous Once Carol Mood, MD 263 mL/hr at 10/22/23 1449 130 mg at 10/22/23 1449    VITAL SIGNS: LMP  (LMP Unknown)  There were no vitals filed for this visit.  Estimated body mass index is 20.12 kg/m as calculated from the following:   Height as of an earlier encounter on 10/22/23: 5\' 4"  (1.626 m).   Weight as of an earlier encounter on 10/22/23: 117 lb 3.2 oz (53.2 kg).   PERFORMANCE STATUS (ECOG) : 1 - Symptomatic but completely ambulatory  Physical Exam General: NAD Cardiovascular: regular rate and rhythm Pulmonary: normal breathing pattern Extremities: no edema, no joint deformities Skin: no rashes Neurological: AAO x3  IMPRESSION: Discussed the use of AI scribe software for clinical note transcription with the patient, who gave verbal consent to proceed.  History of Present Illness I saw [name] during her infusion. No acute distress.  She is accompanied by her daughter.  States she is feeling well overall.  Denies concerns of nausea, vomiting, constipation, or diarrhea.  Taking MiraLAX daily for bowel regimen.  Continues to take things one day at a time.    Patient's appetite is fair.  Weight is increased from 115 pounds on March 25 217 pounds currently though she notes there is still room for improvement.  Education provided on use of Marinol for additional support.  I discussed administration, efficacy, and potential side effects.  Patient and daughter verbalized understanding.  We discussed her pain at length.  She reports well-controlled on current regimen. She is currently taking oxycodone 5 mg every four to six hours as needed.  Patient reminded on refill request  protocol.  We will continue to closely monitor and support.  Her anxiety has improved with lorazepam, taken two to three times a day, and she finds it effective in managing her symptoms.  All questions answered and support provided.  I discussed the importance of continued conversation with family and their medical providers regarding overall plan of care and treatment options, ensuring decisions are within the context of the patients values and GOCs. Assessment & Plan Cancer related pain management Pain is well-managed with oxycodone.  No adjustments to current regimen. - Continue oxycodone 5 mg every four to six hours as needed.  Anxiety Anxiety significantly improved with lorazepam. - Continue lorazepam two to three times daily.  Appetite improvement Appetite improving with weight increase. - Prescribe Marinol 2.5 mg for appetite improvement, three times daily with meals.  I will plan to see patient back in 3-4 weeks.  Sooner if needed.  Patient expressed understanding and was in agreement with this plan. She also understands that She can call the clinic at any time with any questions, concerns, or complaints.   Any controlled substances utilized were prescribed in the context of palliative care. PDMP has been reviewed.   Visit consisted of counseling and education dealing with the complex and emotionally intense issues of symptom management and palliative care in the setting of serious and potentially life-threatening illness.  Willette Alma, AGPCNP-BC  Palliative Medicine Team/[facility]

## 2023-10-22 NOTE — Progress Notes (Signed)
 [name]   Telephone:(336) [phone] Fax:(336) [phone]   Clinic Follow up Note   Patient Care Team: [name], NP as PCP - General (Internal Medicine) [name], MD as Consulting Physician (Hematology and Oncology) [name], MD as Consulting Physician (General Surgery) [name], MD as Consulting Physician (Gastroenterology) [name], [name], NP as Nurse Practitioner [facility] and Palliative Medicine)  Date of Service:  10/22/2023  CHIEF COMPLAINT: f/u of colon cancer   CURRENT THERAPY:  FOLFOX and beva   Oncology History   Colon cancer metastasized to mesenteric lymph nodes (HCC) -[id number] with mesentery node and possible liver metastasis, G2, MMR proficient, KRAS G12D mutation (+) -Patient initially presented with abdominal pain to the emergency room, underwent a colonoscopy in October 2024 which showed a large mass in the splenic lecture of left side colon. -She underwent left hemicolectomy on June 26, 2023.  Large mesenteric mass was also biopsied which confirmed metastatic adenocarcinoma. -Staging CT scan and a liver MRI showed a suspicious 0.8 cm mass in the left lobe of liver, concerning for metastasis.  No other evidence of distant metastatic disease. Liver biopsy on 08/27/2023 was benign.  -I discussed her metastatic disease and the probable incurable nature of her disease. -I recommend systemic treatment with chemotherapy FOLFOX, she started on 08/06/23. -NGS [facility] showed KRAS G12D and PIK3CA mutations, not a candidate for targeted therapy  -Her tumor was tested of for MMR which were normal, she is not a candidate for immunotherapy.  Assessment & Plan Metastatic colon cancer Stage IV metastatic colon cancer, undergoing chemotherapy for nearly three months. Clinically stable with weight gain and no significant pain. Awaiting CT scan results to evaluate treatment response. Explained that tumor shrinkage indicates a  positive response but does not alter the stage. The goal is to reduce cancer cell burden. If cancer progresses, it may complicate health. Treatment duration is typically at least six months, with potential chemotherapy reduction due to neuropathy risk. - Proceed with chemotherapy infusion at 1 PM - Follow up on CT scan results and inform her - Encourage weight gain through diet and exercise - Schedule follow-up appointment with pain management at 2 PM  Neuropathy Experiencing mild intermittent tingling in fingers and toes, a known side effect of chemotherapy. Exercise recommended to improve circulation and alleviate symptoms. - Encourage exercise to improve circulation  Back pain Experiencing back pain, previously managed with oxycodone. Scheduled for evaluation and management by pain management specialist today. - Consult with pain management specialist at 2 PM  Vaginal discharge Reports whitish vaginal discharge with odor, suggestive of bacterial or fungal infection. Recommended gynecological evaluation and testing. - Refer to gynecologist for evaluation of vaginal discharge  Plan -lab reviewed, will proceed chemo today -I reviewed her CT scan and will call her when the report is back  -f/u in 2 weeks    SUMMARY OF ONCOLOGIC HISTORY: Oncology History  Colon cancer metastasized to mesenteric lymph nodes (HCC)  06/26/2023 Cancer Staging   Staging form: Colon and Rectum, AJCC 8th Edition - Pathologic stage from 06/26/2023: Stage IVC (pT4a, pN1b, pM1c) - Signed by [name], MD on 07/25/2023 Residual tumor (R): R1 - Microscopic   07/25/2023 Initial Diagnosis   Colon cancer metastasized to mesenteric lymph nodes (HCC)   08/06/2023 -  Chemotherapy   Patient is on Treatment Plan : COLORECTAL FOLFOX + Bevacizumab q14d        Discussed the use of AI scribe software for clinical note transcription with the  patient, who gave verbal consent to proceed.  History of Present Illness A  53 year old patient with a known diagnosis of metastatic colon cancer presents for a follow-up visit. The patient reports an improvement in eating habits and has gained six pounds over the past month. The patient expresses a desire to regain more weight and return to a previous weight of 150 pounds. The patient denies any current abdominal pain and reports regular bowel movements. The patient denies any neuropathy, numbness, or tingling in the fingers and toes, which are potential side effects of chemotherapy. The patient also mentions a recent CT scan but the results are not yet available. The patient also mentions a concern about a possible bacterial infection, but it is unclear if this is a current issue or a general inquiry.     All other systems were reviewed with the patient and are negative.  MEDICAL HISTORY:  Past Medical History:  Diagnosis Date   Anemia    Asthma    Bartholin cyst    Chiari malformation type I (HCC)    Hernia, inguinal    Hernia, inguinal    Hypertension    ILD (interstitial lung disease) (HCC)     SURGICAL HISTORY: Past Surgical History:  Procedure Laterality Date   fibroid freezing     unsure   IR GENERIC HISTORICAL  07/27/2014   IR RADIOLOGIST EVAL & MGMT 07/27/2014 [name], MD [facility] INTERV RAD   IR PORT REPAIR CENTRAL VENOUS ACCESS DEVICE  10/06/2023   PORTACATH PLACEMENT N/A 07/31/2023   Procedure: PLACEMENT OF PORT A CATHETER UNDER ULTRASOUND AND FLUOROSCOPIC GUIDANCE;  Surgeon: Karie Soda, MD;  Location: [facility];  Service: General;  Laterality: N/A;  GEN WITH ERAS 1.5 HOURS TOTAL   ROBOTIC ASSISTED TOTAL HYSTERECTOMY N/A 09/15/2018   Procedure: XI ROBOTIC ASSISTED TOTAL HYSTERECTOMY WITH SALPINGECTOMY;  Surgeon: Willodean Rosenthal, MD;  Location: [facility] [place];  Service: Gynecology;  Laterality: N/A;    I have reviewed the social history and family history with the patient and they are unchanged from previous note.  ALLERGIES:  is  allergic to hydrocodone.  MEDICATIONS:  Current Outpatient Medications  Medication Sig Dispense Refill   albuterol (VENTOLIN HFA) 108 (90 Base) MCG/ACT inhaler Inhale 1-2 puffs into the lungs every 6 (six) hours as needed for shortness of breath. 2 each 1   amLODipine (NORVASC) 10 MG tablet Take 1 tablet (10 mg total) by mouth daily. 30 tablet 2   ferrous sulfate 325 (65 FE) MG tablet Take 1 tablet (325 mg total) by mouth daily with breakfast. 90 tablet 0   lidocaine-prilocaine (EMLA) cream Apply to affected area once (Patient taking differently: Apply 1 Application topically as needed (for port access).) 30 g 3   LORazepam (ATIVAN) 0.5 MG tablet Take 1 tablet (0.5 mg total) by mouth every 6 (six) hours as needed for anxiety or sleep. 45 tablet 0   mirtazapine (REMERON) 7.5 MG tablet Take 1 tablet (7.5 mg total) by mouth at bedtime. 30 tablet 0   ondansetron (ZOFRAN) 8 MG tablet Take 1 tablet (8 mg total) by mouth every 8 (eight) hours as needed for nausea or vomiting. Start on the third day after chemotherapy. 30 tablet 1   oxyCODONE (OXY IR/ROXICODONE) 5 MG immediate release tablet Take 1 tablet (5 mg total) by mouth every 4 (four) hours as needed for severe pain (pain score 7-10). 60 tablet 0   pantoprazole (PROTONIX) 40 MG tablet Take 1 tablet (40 mg total) by  mouth daily. 30 tablet 0   polyethylene glycol (MIRALAX) 17 g packet Take 17 g by mouth 2 (two) times daily. 30 each 0   prochlorperazine (COMPAZINE) 10 MG tablet Take 1 tablet (10 mg total) by mouth every 6 (six) hours as needed for nausea or vomiting. 30 tablet 1   senna-docusate (SENNA S) 8.6-50 MG tablet Take 1 tablet by mouth daily. 60 tablet 3   No current facility-administered medications for this visit.    PHYSICAL EXAMINATION: ECOG PERFORMANCE STATUS: 1 - Symptomatic but completely ambulatory  Vitals:   10/22/23 1221  BP: 128/80  Pulse: 78  Resp: (!) 22  Temp: 98 F (36.7 C)  SpO2: 99%   Wt Readings from Last 3  Encounters:  10/22/23 117 lb 3.2 oz (53.2 kg)  10/07/23 115 lb 14.4 oz (52.6 kg)  10/01/23 111 lb 14.4 oz (50.8 kg)     GENERAL:alert, no distress and comfortable SKIN: skin color, texture, turgor are normal, no rashes or significant lesions EYES: normal, Conjunctiva are pink and non-injected, sclera clear NECK: supple, thyroid normal size, non-tender, without nodularity LYMPH:  no palpable lymphadenopathy in the cervical, axillary  LUNGS: clear to auscultation and percussion with normal breathing effort HEART: regular rate & rhythm and no murmurs and no lower extremity edema ABDOMEN:abdomen soft, non-tender and normal bowel sounds Musculoskeletal:no cyanosis of digits and no clubbing  NEURO: alert & oriented x 3 with fluent speech, no focal motor/sensory deficits  Physical Exam   LABORATORY DATA:  I have reviewed the data as listed    Latest Ref Rng & Units 10/22/2023   11:44 AM 10/07/2023    8:35 AM 10/01/2023    9:53 AM  CBC  WBC 4.0 - 10.5 K/uL 5.7  5.4  6.1   Hemoglobin 12.0 - 15.0 g/dL 46.9  62.9  52.8   Hematocrit 36.0 - 46.0 % 34.1  33.8  33.7   Platelets 150 - 400 K/uL 246  328  256         Latest Ref Rng & Units 10/22/2023   11:44 AM 10/07/2023    8:35 AM 10/01/2023    9:53 AM  CMP  Glucose 70 - 99 mg/dL 80  413  99   BUN 6 - 20 mg/dL 10  12  12    Creatinine 0.44 - 1.00 mg/dL 2.44  0.10  2.72   Sodium 135 - 145 mmol/L 139  138  139   Potassium 3.5 - 5.1 mmol/L 4.2  3.9  3.8   Chloride 98 - 111 mmol/L 104  106  107   CO2 22 - 32 mmol/L 30  26  24    Calcium 8.9 - 10.3 mg/dL 9.6  9.2  9.3   Total Protein 6.5 - 8.1 g/dL 7.5  7.3  7.0   Total Bilirubin 0.0 - 1.2 mg/dL 0.4  0.4  0.3   Alkaline Phos 38 - 126 U/L 59  71  65   AST 15 - 41 U/L 14  16  14    ALT 0 - 44 U/L 7  11  7        RADIOGRAPHIC STUDIES: I have personally reviewed the radiological images as listed and agreed with the findings in the report. No results found.    No orders of the defined types  were placed in this encounter.  All questions were answered. The patient knows to call the clinic with any problems, questions or concerns. No barriers to learning was detected. The total time spent  in the appointment was 25 minutes.     [name], MD 10/22/2023

## 2023-10-22 NOTE — Assessment & Plan Note (Signed)
-  [id number] with mesentery node and possible liver metastasis, G2, MMR proficient, KRAS G12D mutation (+) -Patient initially presented with abdominal pain to the emergency room, underwent a colonoscopy in October 2024 which showed a large mass in the splenic lecture of left side colon. -She underwent left hemicolectomy on June 26, 2023.  Large mesenteric mass was also biopsied which confirmed metastatic adenocarcinoma. -Staging CT scan and a liver MRI showed a suspicious 0.8 cm mass in the left lobe of liver, concerning for metastasis.  No other evidence of distant metastatic disease. Liver biopsy on 08/27/2023 was benign.  -I discussed her metastatic disease and the probable incurable nature of her disease. -I recommend systemic treatment with chemotherapy FOLFOX, she started on 08/06/23. -NGS [facility] showed KRAS G12D and PIK3CA mutations, not a candidate for targeted therapy  -Her tumor was tested of for MMR which were normal, she is not a candidate for immunotherapy.

## 2023-10-22 NOTE — Patient Instructions (Signed)
 [facility] - A DEPT OF [facility]  Discharge Instructions: Thank you for choosing [facility] to provide your oncology and hematology care.   If you have a lab appointment with the Cancer Center, please go directly to the Cancer Center and check in at the registration area.   Wear comfortable clothing and clothing appropriate for easy access to any Portacath or PICC line.   We strive to give you quality time with your provider. You may need to reschedule your appointment if you arrive late (15 or more minutes).  Arriving late affects you and other patients whose appointments are after yours.  Also, if you miss three or more appointments without notifying the office, you may be dismissed from the clinic at the provider's discretion.      For prescription refill requests, have your pharmacy contact our office and allow 72 hours for refills to be completed.    Today you received the following chemotherapy and/or immunotherapy agents bevacizumab, oxaliplatin, fluorourcil, leucovorin      To help prevent nausea and vomiting after your treatment, we encourage you to take your nausea medication as directed.  BELOW ARE SYMPTOMS THAT SHOULD BE REPORTED IMMEDIATELY: *FEVER GREATER THAN 100.4 F (38 C) OR HIGHER *CHILLS OR SWEATING *NAUSEA AND VOMITING THAT IS NOT CONTROLLED WITH YOUR NAUSEA MEDICATION *UNUSUAL SHORTNESS OF BREATH *UNUSUAL BRUISING OR BLEEDING *URINARY PROBLEMS (pain or burning when urinating, or frequent urination) *BOWEL PROBLEMS (unusual diarrhea, constipation, pain near the anus) TENDERNESS IN MOUTH AND THROAT WITH OR WITHOUT PRESENCE OF ULCERS (sore throat, sores in mouth, or a toothache) UNUSUAL RASH, SWELLING OR PAIN  UNUSUAL VAGINAL DISCHARGE OR ITCHING   Items with * indicate a potential emergency and should be followed up as soon as possible or go to the Emergency Department if any problems should occur.  Please show the  CHEMOTHERAPY ALERT CARD or IMMUNOTHERAPY ALERT CARD at check-in to the Emergency Department and triage nurse.  Should you have questions after your visit or need to cancel or reschedule your appointment, please contact [facility] - A DEPT OF [facility]  Dept: [phone]  and follow the prompts.  Office hours are 8:00 a.m. to 4:30 p.m. Monday - Friday. Please note that voicemails left after 4:00 p.m. may not be returned until the following business day.  We are closed weekends and major holidays. You have access to a nurse at all times for urgent questions. Please call the main number to the clinic Dept: [phone] and follow the prompts.   For any non-urgent questions, you may also contact your provider using MyChart. We now offer e-Visits for anyone 8 and older to request care online for non-urgent symptoms. For details visit [email or web address].   Also download the MyChart app! Go to the app store, search "MyChart", open the app, select [place], and log in with your MyChart username and password.

## 2023-10-24 ENCOUNTER — Inpatient Hospital Stay

## 2023-10-24 ENCOUNTER — Telehealth: Payer: Self-pay

## 2023-10-24 VITALS — HR 76

## 2023-10-24 DIAGNOSIS — Z5112 Encounter for antineoplastic immunotherapy: Secondary | ICD-10-CM | POA: Diagnosis not present

## 2023-10-24 DIAGNOSIS — C189 Malignant neoplasm of colon, unspecified: Secondary | ICD-10-CM

## 2023-10-24 MED ORDER — HEPARIN SOD (PORK) LOCK FLUSH 100 UNIT/ML IV SOLN
500.0000 [IU] | Freq: Once | INTRAVENOUS | Status: AC | PRN
  Administered 2023-10-24: 500 [IU]

## 2023-10-24 MED ORDER — SODIUM CHLORIDE 0.9% FLUSH
10.0000 mL | INTRAVENOUS | Status: DC | PRN
  Administered 2023-10-24: 10 mL

## 2023-10-24 NOTE — Telephone Encounter (Signed)
 Pt called for dronabinol PA, RN called pharmacy and dronabinol is on national backorder and is not in stock at any of our local pharmacies. Pt updated and verbalized understanding PA completed for when medication is back in stock.

## 2023-10-27 ENCOUNTER — Encounter: Payer: Self-pay | Admitting: Hematology

## 2023-10-27 ENCOUNTER — Inpatient Hospital Stay: Admitting: Licensed Clinical Social Worker

## 2023-10-27 DIAGNOSIS — C189 Malignant neoplasm of colon, unspecified: Secondary | ICD-10-CM

## 2023-10-27 NOTE — Progress Notes (Signed)
 [facility] CSW Progress Note  Visual merchandiser  received a call from pt requesting a letter from the oncologist verifying diagnosis and treatment to bring to the [employer] tomorrow.  Letter drafted, signed and left at reception for pt to pick up.  Pt also requested a call from Shauna to find out how much money is still available through the [employer] as she would like to apply it towards her light bill.  Message sent to Shauna to follow up w/ pt.      Quenton Bruns, LCSW Clinical Social Worker [facility]    Patient is participating in a Managed Medicaid Plan:  Yes

## 2023-10-27 NOTE — Progress Notes (Signed)
 Called patient per request from social worker to provide grant balance. Provided information to patient and advised what is needed to provide information to submit expense.  She has my contact information for any additional financial questions or concerns.

## 2023-10-29 ENCOUNTER — Ambulatory Visit

## 2023-10-29 ENCOUNTER — Ambulatory Visit: Admitting: Hematology

## 2023-10-29 ENCOUNTER — Telehealth: Payer: Self-pay

## 2023-10-29 ENCOUNTER — Other Ambulatory Visit

## 2023-10-29 NOTE — Telephone Encounter (Signed)
 Notified Patient by voicemail of prior authorization approval for Dronabinol 2.5 mg Capsules. Medication is approved through 10/27/2024. Pharmacy notified. No other needs or concerns noted at this time.

## 2023-10-31 ENCOUNTER — Encounter

## 2023-10-31 ENCOUNTER — Other Ambulatory Visit: Payer: Self-pay

## 2023-11-04 NOTE — Assessment & Plan Note (Addendum)
-  [id number] with mesentery node and possible liver metastasis, G2, MMR proficient, KRAS G12D mutation (+) -Patient initially presented with abdominal pain to the emergency room, underwent a colonoscopy in October 2024 which showed a large mass in the splenic lecture of left side colon. -She underwent left hemicolectomy on June 26, 2023.  Large mesenteric mass was also biopsied which confirmed metastatic adenocarcinoma. -Staging CT scan and a liver MRI showed a suspicious 0.8 cm mass in the left lobe of liver, concerning for metastasis.  No other evidence of distant metastatic disease. Liver biopsy on 08/27/2023 was benign.  -I discussed her metastatic disease and the probable incurable nature of her disease. -I recommend systemic treatment with chemotherapy FOLFOX, she started on 08/06/23. -NGS [facility] showed KRAS G12D and PIK3CA mutations, not a candidate for targeted therapy  -Her tumor was tested of for MMR which were normal, (+) KRAS mutation, not a candidate for targeted or immunotherapy -CT 10/20/2023 was negative for residual disease

## 2023-11-05 ENCOUNTER — Inpatient Hospital Stay

## 2023-11-05 ENCOUNTER — Inpatient Hospital Stay (HOSPITAL_BASED_OUTPATIENT_CLINIC_OR_DEPARTMENT_OTHER): Admitting: Hematology

## 2023-11-05 ENCOUNTER — Encounter: Payer: Self-pay | Admitting: Hematology

## 2023-11-05 VITALS — BP 138/80 | HR 82 | Temp 97.9°F | Resp 22 | Ht 64.0 in | Wt 115.3 lb

## 2023-11-05 DIAGNOSIS — C772 Secondary and unspecified malignant neoplasm of intra-abdominal lymph nodes: Secondary | ICD-10-CM | POA: Diagnosis not present

## 2023-11-05 DIAGNOSIS — C189 Malignant neoplasm of colon, unspecified: Secondary | ICD-10-CM | POA: Diagnosis not present

## 2023-11-05 DIAGNOSIS — Z95828 Presence of other vascular implants and grafts: Secondary | ICD-10-CM

## 2023-11-05 DIAGNOSIS — G893 Neoplasm related pain (acute) (chronic): Secondary | ICD-10-CM

## 2023-11-05 DIAGNOSIS — Z515 Encounter for palliative care: Secondary | ICD-10-CM | POA: Diagnosis not present

## 2023-11-05 DIAGNOSIS — Z5112 Encounter for antineoplastic immunotherapy: Secondary | ICD-10-CM | POA: Diagnosis not present

## 2023-11-05 LAB — CBC WITH DIFFERENTIAL (CANCER CENTER ONLY)
Abs Immature Granulocytes: 0 10*3/uL (ref 0.00–0.07)
Basophils Absolute: 0.1 10*3/uL (ref 0.0–0.1)
Basophils Relative: 1 %
Eosinophils Absolute: 0.1 10*3/uL (ref 0.0–0.5)
Eosinophils Relative: 2 %
HCT: 33.4 % — ABNORMAL LOW (ref 36.0–46.0)
Hemoglobin: 11.6 g/dL — ABNORMAL LOW (ref 12.0–15.0)
Immature Granulocytes: 0 %
Lymphocytes Relative: 38 %
Lymphs Abs: 2.2 10*3/uL (ref 0.7–4.0)
MCH: 23.1 pg — ABNORMAL LOW (ref 26.0–34.0)
MCHC: 34.7 g/dL (ref 30.0–36.0)
MCV: 66.5 fL — ABNORMAL LOW (ref 80.0–100.0)
Monocytes Absolute: 0.9 10*3/uL (ref 0.1–1.0)
Monocytes Relative: 15 %
Neutro Abs: 2.6 10*3/uL (ref 1.7–7.7)
Neutrophils Relative %: 44 %
Platelet Count: 210 10*3/uL (ref 150–400)
RBC: 5.02 MIL/uL (ref 3.87–5.11)
RDW: 21.7 % — ABNORMAL HIGH (ref 11.5–15.5)
WBC Count: 5.8 10*3/uL (ref 4.0–10.5)
nRBC: 0 % (ref 0.0–0.2)

## 2023-11-05 LAB — CMP (CANCER CENTER ONLY)
ALT: 6 U/L (ref 0–44)
AST: 13 U/L — ABNORMAL LOW (ref 15–41)
Albumin: 3.8 g/dL (ref 3.5–5.0)
Alkaline Phosphatase: 57 U/L (ref 38–126)
Anion gap: 5 (ref 5–15)
BUN: 11 mg/dL (ref 6–20)
CO2: 27 mmol/L (ref 22–32)
Calcium: 9.1 mg/dL (ref 8.9–10.3)
Chloride: 108 mmol/L (ref 98–111)
Creatinine: 0.77 mg/dL (ref 0.44–1.00)
GFR, Estimated: >60 mL/min (ref >60)
Glucose, Bld: 92 mg/dL (ref 70–99)
Potassium: 4.2 mmol/L (ref 3.5–5.1)
Sodium: 140 mmol/L (ref 135–145)
Total Bilirubin: 0.3 mg/dL (ref 0.0–1.2)
Total Protein: 7 g/dL (ref 6.5–8.1)

## 2023-11-05 LAB — TOTAL PROTEIN, URINE DIPSTICK: Protein, ur: NEGATIVE mg/dL

## 2023-11-05 MED ORDER — FLUOROURACIL CHEMO INJECTION 2.5 GM/50ML
400.0000 mg/m2 | Freq: Once | INTRAVENOUS | Status: AC
  Administered 2023-11-05: 600 mg via INTRAVENOUS
  Filled 2023-11-05: qty 12

## 2023-11-05 MED ORDER — SODIUM CHLORIDE 0.9 % IV SOLN
2400.0000 mg/m2 | INTRAVENOUS | Status: DC
  Administered 2023-11-05: 3500 mg via INTRAVENOUS
  Filled 2023-11-05: qty 70

## 2023-11-05 MED ORDER — OXALIPLATIN CHEMO INJECTION 100 MG/20ML
85.0000 mg/m2 | Freq: Once | INTRAVENOUS | Status: AC
  Administered 2023-11-05: 130 mg via INTRAVENOUS
  Filled 2023-11-05: qty 6

## 2023-11-05 MED ORDER — SODIUM CHLORIDE 0.9 % IV SOLN: INTRAVENOUS | Status: DC

## 2023-11-05 MED ORDER — OXYCODONE HCL 5 MG PO TABS
5.0000 mg | ORAL_TABLET | ORAL | 0 refills | Status: DC | PRN
Start: 2023-11-05 — End: 2023-12-17

## 2023-11-05 MED ORDER — PALONOSETRON HCL INJECTION 0.25 MG/5ML
0.2500 mg | Freq: Once | INTRAVENOUS | Status: AC
  Administered 2023-11-05: 0.25 mg via INTRAVENOUS
  Filled 2023-11-05: qty 5

## 2023-11-05 MED ORDER — DEXTROSE 5 % IV SOLN: INTRAVENOUS | Status: DC

## 2023-11-05 MED ORDER — DEXAMETHASONE SODIUM PHOSPHATE 10 MG/ML IJ SOLN
10.0000 mg | Freq: Once | INTRAMUSCULAR | Status: AC
  Administered 2023-11-05: 10 mg via INTRAVENOUS
  Filled 2023-11-05: qty 1

## 2023-11-05 MED ORDER — SODIUM CHLORIDE 0.9% FLUSH
10.0000 mL | Freq: Once | INTRAVENOUS | Status: AC
  Administered 2023-11-05: 10 mL

## 2023-11-05 MED ORDER — SODIUM CHLORIDE 0.9 % IV SOLN
5.0000 mg/kg | Freq: Once | INTRAVENOUS | Status: AC
  Administered 2023-11-05: 250 mg via INTRAVENOUS
  Filled 2023-11-05: qty 10

## 2023-11-05 MED ORDER — LEUCOVORIN CALCIUM INJECTION 350 MG
400.0000 mg/m2 | Freq: Once | INTRAVENOUS | Status: AC
  Administered 2023-11-05: 600 mg via INTRAVENOUS
  Filled 2023-11-05: qty 30

## 2023-11-05 MED ORDER — LORAZEPAM 1 MG PO TABS
0.5000 mg | ORAL_TABLET | Freq: Once | ORAL | Status: AC
  Administered 2023-11-05: 0.5 mg via ORAL
  Filled 2023-11-05: qty 1

## 2023-11-05 NOTE — Progress Notes (Signed)
 [facility]   Telephone:(336) [phone] Fax:(336) [phone]   Clinic Follow up Note   Patient Care Team: Marius Siemens, NP as PCP - General (Internal Medicine) Sonja [place], MD as Consulting Physician (Hematology and Oncology) Joyce Nixon, MD as Consulting Physician (General Surgery) Elois Hair, MD as Consulting Physician (Gastroenterology) [name], Giles Labrum, NP as Nurse Practitioner [facility])  Date of Service:  11/05/2023  CHIEF COMPLAINT: f/u of colon cancer  CURRENT THERAPY:  Adjuvant FOLFOX  Oncology History   Colon cancer metastasized to mesenteric lymph nodes (HCC) -[id number] with mesentery node and possible liver metastasis, G2, MMR proficient, KRAS G12D mutation (+) -Patient initially presented with abdominal pain to the emergency room, underwent a colonoscopy in October 2024 which showed a large mass in the splenic lecture of left side colon. -She underwent left hemicolectomy on June 26, 2023.  Large mesenteric mass was also biopsied which confirmed metastatic adenocarcinoma. -Staging CT scan and a liver MRI showed a suspicious 0.8 cm mass in the left lobe of liver, concerning for metastasis.  No other evidence of distant metastatic disease. Liver biopsy on 08/27/2023 was benign.  -I discussed her metastatic disease and the probable incurable nature of her disease. -I recommend systemic treatment with chemotherapy FOLFOX, she started on 08/06/23. -NGS [facility] showed KRAS G12D and PIK3CA mutations, not a candidate for targeted therapy  -Her tumor was tested of for MMR which were normal, (+) KRAS mutation, not a candidate for targeted or immunotherapy -CT 10/20/2023 was negative for residual disease    Assessment & Plan Colon cancer undergoing chemotherapy Colon cancer with ongoing chemotherapy. CT scan shows no visible cancer, but microscopic disease cannot be ruled out. She is undergoing 12 cycles of  chemotherapy, expected to complete by January 14, 2024. High risk of recurrence remains. She experiences fatigue and loss of appetite post-chemotherapy but recovers after a few days. Informed consent includes understanding the toxic nature of chemotherapy and potential for high recurrence risk despite treatment. - Continue chemotherapy until January 14, 2024, for a total of 12 cycles. - Perform blood test post-chemotherapy to detect any microscopic disease. - If blood test is negative, discontinue chemotherapy. - Monitor for recurrence with follow-up visits every three months.  Chemotherapy-induced peripheral neuropathy Intermittent chemotherapy-induced peripheral neuropathy with tingling and numbness, particularly when touching cold objects. Symptoms are currently resolved but have been recurrent.  Pain management She requires oxycodone  for pain management, taking two tablets every other day. Refill needed for continued management. - Refill oxycodone  prescription.   Plan - I personally reviewed her restaging CT scan from October 20, 2023, and discussed the finding with her - Continue FOLFOX, plan for total of 6 months.  If significant tumor DNA negative, then plan to stop chemo after 6 months - Follow-up in 2 weeks before next cycle   SUMMARY OF ONCOLOGIC HISTORY: Oncology History  Colon cancer metastasized to mesenteric lymph nodes (HCC)  06/26/2023 Cancer Staging   Staging form: Colon and Rectum, AJCC 8th Edition - Pathologic stage from 06/26/2023: Stage IVC (pT4a, pN1b, pM1c) - Signed by Sonja Lopeno, MD on 07/25/2023 Residual tumor (R): R1 - Microscopic   07/25/2023 Initial Diagnosis   Colon cancer metastasized to mesenteric lymph nodes (HCC)   08/06/2023 -  Chemotherapy   Patient is on Treatment Plan : COLORECTAL FOLFOX + Bevacizumab  q14d        Discussed the use of AI scribe software for clinical note transcription with the patient, who gave verbal consent  to proceed.  History of Present  Illness A 53 year old patient with a history of colon cancer presents for a follow-up visit. The patient is currently undergoing chemotherapy and reports experiencing side effects such as loss of appetite and fatigue, which last for about three to four days after each treatment. Despite these side effects, the patient reports that she is able to recover. The patient also expresses concern about her inability to gain weight, despite an increase in food intake. The patient's weight has remained stable, fluctuating between 115 and 117 pounds. The patient also reports needing a refill for her oxycodone  prescription.     All other systems were reviewed with the patient and are negative.  MEDICAL HISTORY:  Past Medical History:  Diagnosis Date   Anemia    Asthma    Bartholin cyst    Chiari malformation type I (HCC)    Hernia, inguinal    Hernia, inguinal    Hypertension    ILD (interstitial lung disease) (HCC)     SURGICAL HISTORY: Past Surgical History:  Procedure Laterality Date   fibroid freezing     unsure   IR GENERIC HISTORICAL  07/27/2014   IR RADIOLOGIST EVAL & MGMT 07/27/2014 Elene Griffes, MD [facility] INTERV RAD   IR PORT REPAIR CENTRAL VENOUS ACCESS DEVICE  10/06/2023   PORTACATH PLACEMENT N/A 07/31/2023   Procedure: PLACEMENT OF PORT A CATHETER UNDER ULTRASOUND AND FLUOROSCOPIC GUIDANCE;  Surgeon: Candyce Champagne, MD;  Location: [facility];  Service: General;  Laterality: N/A;  GEN WITH ERAS 1.5 HOURS TOTAL   ROBOTIC ASSISTED TOTAL HYSTERECTOMY N/A 09/15/2018   Procedure: XI ROBOTIC ASSISTED TOTAL HYSTERECTOMY WITH SALPINGECTOMY;  Surgeon: Lenord Radon, MD;  Location: [facility];  Service: Gynecology;  Laterality: N/A;    I have reviewed the social history and family history with the patient and they are unchanged from previous note.  ALLERGIES:  is allergic to hydrocodone .  MEDICATIONS:  Current Outpatient Medications  Medication Sig Dispense Refill   albuterol   (VENTOLIN  HFA) 108 (90 Base) MCG/ACT inhaler Inhale 1-2 puffs into the lungs every 6 (six) hours as needed for shortness of breath. 2 each 1   amLODipine  (NORVASC ) 10 MG tablet Take 1 tablet (10 mg total) by mouth daily. 30 tablet 2   dronabinol  (MARINOL ) 2.5 MG capsule Take 2 capsules (5 mg total) by mouth 3 (three) times daily before meals. 90 capsule 1   ferrous sulfate  325 (65 FE) MG tablet Take 1 tablet (325 mg total) by mouth daily with breakfast. 90 tablet 0   lidocaine -prilocaine  (EMLA ) cream Apply to affected area once (Patient taking differently: Apply 1 Application topically as needed (for port access).) 30 g 3   LORazepam  (ATIVAN ) 0.5 MG tablet Take 1 tablet (0.5 mg total) by mouth every 6 (six) hours as needed for anxiety or sleep. 45 tablet 0   mirtazapine  (REMERON ) 7.5 MG tablet Take 1 tablet (7.5 mg total) by mouth at bedtime. 30 tablet 0   ondansetron  (ZOFRAN ) 8 MG tablet Take 1 tablet (8 mg total) by mouth every 8 (eight) hours as needed for nausea or vomiting. Start on the third day after chemotherapy. 30 tablet 1   pantoprazole  (PROTONIX ) 40 MG tablet Take 1 tablet (40 mg total) by mouth daily. 30 tablet 0   polyethylene glycol (MIRALAX ) 17 g packet Take 17 g by mouth 2 (two) times daily. 30 each 0   prochlorperazine  (COMPAZINE ) 10 MG tablet Take 1 tablet (10 mg total) by mouth every  6 (six) hours as needed for nausea or vomiting. 30 tablet 1   senna-docusate (SENNA S) 8.6-50 MG tablet Take 1 tablet by mouth daily. 60 tablet 3   oxyCODONE  (OXY IR/ROXICODONE ) 5 MG immediate release tablet Take 1 tablet (5 mg total) by mouth every 4 (four) hours as needed for severe pain (pain score 7-10). 60 tablet 0   No current facility-administered medications for this visit.   Facility-Administered Medications Ordered in Other Visits  Medication Dose Route Frequency Provider Last Rate Last Admin   0.9 %  sodium chloride  infusion   Intravenous Continuous Sonja Mazie, MD   Stopped at 11/05/23  1227   dextrose  5 % solution   Intravenous Continuous Sonja [place], MD   Stopped at 11/05/23 1501   fluorouracil  (ADRUCIL ) 3,500 mg in sodium chloride  0.9 % 80 mL chemo infusion  2,400 mg/m2 (Treatment Plan Recorded) Intravenous 1 day or 1 dose Sonja Chalkhill, MD   Infusion Verify at 11/05/23 1518    PHYSICAL EXAMINATION: ECOG PERFORMANCE STATUS: 1 - Symptomatic but completely ambulatory  Vitals:   11/05/23 1056  BP: 138/80  Pulse: 82  Resp: (!) 22  Temp: 97.9 F (36.6 C)  SpO2: 99%   Wt Readings from Last 3 Encounters:  11/05/23 115 lb 4.8 oz (52.3 kg)  10/22/23 117 lb 3.2 oz (53.2 kg)  10/07/23 115 lb 14.4 oz (52.6 kg)     GENERAL:alert, no distress and comfortable SKIN: skin color, texture, turgor are normal, no rashes or significant lesions EYES: normal, Conjunctiva are pink and non-injected, sclera clear NECK: supple, thyroid  normal size, non-tender, without nodularity LYMPH:  no palpable lymphadenopathy in the cervical, axillary  LUNGS: clear to auscultation and percussion with normal breathing effort HEART: regular rate & rhythm and no murmurs and no lower extremity edema ABDOMEN:abdomen soft, non-tender and normal bowel sounds Musculoskeletal:no cyanosis of digits and no clubbing  NEURO: alert & oriented x 3 with fluent speech, no focal motor/sensory deficits  Physical Exam MEASUREMENTS: Weight- 115.  LABORATORY DATA:  I have reviewed the data as listed    Latest Ref Rng & Units 11/05/2023   10:31 AM 10/22/2023   11:44 AM 10/07/2023    8:35 AM  CBC  WBC 4.0 - 10.5 K/uL 5.8  5.7  5.4   Hemoglobin 12.0 - 15.0 g/dL 95.2  84.1  32.4   Hematocrit 36.0 - 46.0 % 33.4  34.1  33.8   Platelets 150 - 400 K/uL 210  246  328         Latest Ref Rng & Units 11/05/2023   10:31 AM 10/22/2023   11:44 AM 10/07/2023    8:35 AM  CMP  Glucose 70 - 99 mg/dL 92  80  401   BUN 6 - 20 mg/dL 11  10  12    Creatinine 0.44 - 1.00 mg/dL 0.27  2.53  6.64   Sodium 135 - 145 mmol/L 140  139  138    Potassium 3.5 - 5.1 mmol/L 4.2  4.2  3.9   Chloride 98 - 111 mmol/L 108  104  106   CO2 22 - 32 mmol/L 27  30  26    Calcium  8.9 - 10.3 mg/dL 9.1  9.6  9.2   Total Protein 6.5 - 8.1 g/dL 7.0  7.5  7.3   Total Bilirubin 0.0 - 1.2 mg/dL 0.3  0.4  0.4   Alkaline Phos 38 - 126 U/L 57  59  71   AST 15 - 41 U/L  13  14  16    ALT 0 - 44 U/L 6  7  11        RADIOGRAPHIC STUDIES: I have personally reviewed the radiological images as listed and agreed with the findings in the report. No results found.    Orders Placed This Encounter  Procedures   CBC with Differential (Cancer Center Only)    Standing Status:   Future    Expected Date:   12/17/2023    Expiration Date:   12/16/2024   CMP (Cancer Center only)    Standing Status:   Future    Expected Date:   12/17/2023    Expiration Date:   12/16/2024   Total Protein, Urine dipstick    Standing Status:   Future    Expected Date:   12/17/2023    Expiration Date:   12/16/2024   CBC with Differential (Cancer Center Only)    Standing Status:   Future    Expected Date:   12/31/2023    Expiration Date:   12/30/2024   CMP (Cancer Center only)    Standing Status:   Future    Expected Date:   12/31/2023    Expiration Date:   12/30/2024   Total Protein, Urine dipstick    Standing Status:   Future    Expected Date:   12/31/2023    Expiration Date:   12/30/2024   CBC with Differential (Cancer Center Only)    Standing Status:   Future    Expected Date:   01/14/2024    Expiration Date:   01/13/2025   CMP (Cancer Center only)    Standing Status:   Future    Expected Date:   01/14/2024    Expiration Date:   01/13/2025   Total Protein, Urine dipstick    Standing Status:   Future    Expected Date:   01/14/2024    Expiration Date:   01/13/2025   All questions were answered. The patient knows to call the clinic with any problems, questions or concerns. No barriers to learning was detected. The total time spent in the appointment was 25 minutes.     Sonja Broomfield,  MD 11/05/2023

## 2023-11-05 NOTE — Patient Instructions (Signed)
 [facility] - A DEPT OF [facility]  Discharge Instructions: Thank you for choosing [facility] to provide your oncology and hematology care.   If you have a lab appointment with the Cancer Center, please go directly to the Cancer Center and check in at the registration area.   Wear comfortable clothing and clothing appropriate for easy access to any Portacath or PICC line.   We strive to give you quality time with your provider. You may need to reschedule your appointment if you arrive late (15 or more minutes).  Arriving late affects you and other patients whose appointments are after yours.  Also, if you miss three or more appointments without notifying the office, you may be dismissed from the clinic at the provider's discretion.      For prescription refill requests, have your pharmacy contact our office and allow 72 hours for refills to be completed.    Today you received the following chemotherapy and/or immunotherapy agents Bevacizumab , Leukovorin, Oxaliplatin , Fluorourcil    To help prevent nausea and vomiting after your treatment, we encourage you to take your nausea medication as directed.  BELOW ARE SYMPTOMS THAT SHOULD BE REPORTED IMMEDIATELY: *FEVER GREATER THAN 100.4 F (38 C) OR HIGHER *CHILLS OR SWEATING *NAUSEA AND VOMITING THAT IS NOT CONTROLLED WITH YOUR NAUSEA MEDICATION *UNUSUAL SHORTNESS OF BREATH *UNUSUAL BRUISING OR BLEEDING *URINARY PROBLEMS (pain or burning when urinating, or frequent urination) *BOWEL PROBLEMS (unusual diarrhea, constipation, pain near the anus) TENDERNESS IN MOUTH AND THROAT WITH OR WITHOUT PRESENCE OF ULCERS (sore throat, sores in mouth, or a toothache) UNUSUAL RASH, SWELLING OR PAIN  UNUSUAL VAGINAL DISCHARGE OR ITCHING   Items with * indicate a potential emergency and should be followed up as soon as possible or go to the Emergency Department if any problems should occur.  Please show the  CHEMOTHERAPY ALERT CARD or IMMUNOTHERAPY ALERT CARD at check-in to the Emergency Department and triage nurse.  Should you have questions after your visit or need to cancel or reschedule your appointment, please contact [facility] - A DE[facility]  Dept: [phone]  and follow the prompts.  Office hours are 8:00 a.m. to 4:30 p.m. Monday - Friday. Please note that voicemails left after 4:00 p.m. may not be returned until the following business day.  We are closed weekends and major holidays. You have access to a nurse at all times for urgent questions. Please call the main number to the clinic Dept: [phone] and follow the prompts.   For any non-urgent questions, you may also contact your provider using MyChart. We now offer e-Visits for anyone 40 and older to request care online for non-urgent symptoms. For details visit [email or web address].   Also download the MyChart app! Go to the app store, search "MyChart", open the app, select [place], and log in with your MyChart username and password.

## 2023-11-07 ENCOUNTER — Inpatient Hospital Stay

## 2023-11-07 VITALS — BP 157/80 | HR 72 | Temp 98.2°F | Resp 18

## 2023-11-07 DIAGNOSIS — Z5112 Encounter for antineoplastic immunotherapy: Secondary | ICD-10-CM | POA: Diagnosis not present

## 2023-11-07 DIAGNOSIS — Z95828 Presence of other vascular implants and grafts: Secondary | ICD-10-CM

## 2023-11-07 MED ORDER — HEPARIN SOD (PORK) LOCK FLUSH 100 UNIT/ML IV SOLN
500.0000 [IU] | Freq: Once | INTRAVENOUS | Status: AC
Start: 2023-11-07 — End: 2023-11-07
  Administered 2023-11-07: 500 [IU]

## 2023-11-07 MED ORDER — SODIUM CHLORIDE 0.9% FLUSH
10.0000 mL | Freq: Once | INTRAVENOUS | Status: AC
  Administered 2023-11-07: 10 mL

## 2023-11-12 ENCOUNTER — Other Ambulatory Visit

## 2023-11-12 ENCOUNTER — Ambulatory Visit: Admitting: Nurse Practitioner

## 2023-11-12 ENCOUNTER — Ambulatory Visit

## 2023-11-14 ENCOUNTER — Encounter

## 2023-11-18 ENCOUNTER — Other Ambulatory Visit: Payer: Self-pay | Admitting: Nurse Practitioner

## 2023-11-18 DIAGNOSIS — G4709 Other insomnia: Secondary | ICD-10-CM

## 2023-11-18 DIAGNOSIS — F419 Anxiety disorder, unspecified: Secondary | ICD-10-CM

## 2023-11-18 DIAGNOSIS — Z515 Encounter for palliative care: Secondary | ICD-10-CM

## 2023-11-18 DIAGNOSIS — C189 Malignant neoplasm of colon, unspecified: Secondary | ICD-10-CM

## 2023-11-18 NOTE — Assessment & Plan Note (Addendum)
-  [id number] with mesentery node and possible liver metastasis, G2, MMR proficient, KRAS G12D mutation (+) -Patient initially presented with abdominal pain to the emergency room, underwent a colonoscopy in October 2024 which showed a large mass in the splenic lecture of left side colon. -She underwent left hemicolectomy on June 26, 2023.  Large mesenteric mass was also biopsied which confirmed metastatic adenocarcinoma. -Staging CT scan and a liver MRI showed a suspicious 0.8 cm mass in the left lobe of liver, concerning for metastasis.  No other evidence of distant metastatic disease. Liver biopsy on 08/27/2023 was benign.  -I discussed her metastatic disease and the probable incurable nature of her disease. -I recommend systemic treatment with chemotherapy FOLFOX, she started on 08/06/23. -NGS [facility] showed KRAS G12D and PIK3CA mutations, not a candidate for targeted therapy  -Her tumor was tested of for MMR which were normal, (+) KRAS mutation, not a candidate for targeted or immunotherapy -CT 10/20/2023 was negative for residual disease -plan to proceed with 6 months of chemo, taking her to July 2025.  Will then assess for ctDNA.  If negative, will discontinue FOLFOX and began colon cancer surveillance. -11/19/2023 -she presents for cycle 8 day 1 of chemotherapy FOLFOX. Tolerating chemotherapy very well.  Will proceed with chemotherapy FOLFOX every 2 weeks as scheduled.

## 2023-11-18 NOTE — Progress Notes (Signed)
 Patient Care Team: Marius Siemens, NP as PCP - General (Internal Medicine) Sonja Cobb, MD as Consulting Physician (Hematology and Oncology) Joyce Nixon, MD as Consulting Physician (General Surgery) Elois Hair, MD as Consulting Physician (Gastroenterology) [name], Giles Labrum, NP as Nurse Practitioner [facility] and Palliative Medicine)  Clinic Day:  11/23/2023  Referring physician: Sonja [place], MD  ASSESSMENT & PLAN:   Assessment & Plan: Colon cancer metastasized to mesenteric lymph nodes (HCC) -[id number] with mesentery node and possible liver metastasis, G2, MMR proficient, KRAS G12D mutation (+) -Patient initially presented with abdominal pain to the emergency room, underwent a colonoscopy in October 2024 which showed a large mass in the splenic lecture of left side colon. -She underwent left hemicolectomy on June 26, 2023.  Large mesenteric mass was also biopsied which confirmed metastatic adenocarcinoma. -Staging CT scan and a liver MRI showed a suspicious 0.8 cm mass in the left lobe of liver, concerning for metastasis.  No other evidence of distant metastatic disease. Liver biopsy on 08/27/2023 was benign.  -I discussed her metastatic disease and the probable incurable nature of her disease. -I recommend systemic treatment with chemotherapy FOLFOX, she started on 08/06/23. -NGS [facility] showed KRAS G12D and PIK3CA mutations, not a candidate for targeted therapy  -Her tumor was tested of for MMR which were normal, (+) KRAS mutation, not a candidate for targeted or immunotherapy -CT 10/20/2023 was negative for residual disease -plan to proceed with 6 months of chemo, taking her to July 2025.  Will then assess for ctDNA.  If negative, will discontinue FOLFOX and began colon cancer surveillance. -11/19/2023 -she presents for cycle 8 day 1 of chemotherapy FOLFOX. Tolerating chemotherapy very well.  Will proceed with chemotherapy FOLFOX every 2 weeks as scheduled.    Plan Reviewed labs. -Stable and mild anemia with Hgb 11.8 and HCT 33.1.  Recommend OTC prenatal vitamin with extra iron. -Normal ferritin of 62 -CMP unremarkable. Proceed with cycle 8 day 1 FOLFOX. Labs/flush, follow-up, and cycle 9 day 1 FOLFOX in 2 weeks as scheduled.  The patient understands the plans discussed today and is in agreement with them.  She knows to contact our office if she develops concerns prior to her next appointment.  I provided 25 minutes of face-to-face time during this encounter and > 50% was spent counseling as documented under my assessment and plan.    Sharyon Deis, NP  [place] [facility] - A DEPT OF [facility] [address] [place] [address] Dept: [phone] Dept Fax: [phone]   No orders of the defined types were placed in this encounter.     CHIEF COMPLAINT:  CC: metastatic colon cancer  Current Treatment: Adjuvant FOLFOX  INTERVAL HISTORY:  Carol Mack is here today for repeat clinical assessment.  She was last seen on 11/05/2023.  Today, she presents for cycle 8 day 1 of chemotherapy with FOLFOX.  She reports no physical concerns or complaints today.  States she will have some neuropathy expresses coldness in her fingertips and toes during chemotherapy.  This does resolve on its own. She denies chest pain, chest pressure, or shortness of breath. She denies headaches or visual disturbances. She denies abdominal pain, nausea, vomiting, or changes in bowel or bladder habits.   She denies fevers or chills. She denies pain. Her appetite is good. Her weight has increased 3 pounds over last 2 weeks.  I have reviewed the past medical history, past surgical history, social  history and family history with the patient and they are unchanged from previous note.  ALLERGIES:  is allergic to hydrocodone .  MEDICATIONS:  Current Outpatient Medications  Medication Sig Dispense Refill    albuterol  (VENTOLIN  HFA) 108 (90 Base) MCG/ACT inhaler Inhale 1-2 puffs into the lungs every 6 (six) hours as needed for shortness of breath. 2 each 1   amLODipine  (NORVASC ) 10 MG tablet Take 1 tablet (10 mg total) by mouth daily. 30 tablet 2   dronabinol  (MARINOL ) 2.5 MG capsule Take 2 capsules (5 mg total) by mouth 3 (three) times daily before meals. 90 capsule 1   ferrous sulfate  325 (65 FE) MG tablet Take 1 tablet (325 mg total) by mouth daily with breakfast. 90 tablet 0   lidocaine -prilocaine  (EMLA ) cream Apply to affected area once (Patient taking differently: Apply 1 Application topically as needed (for port access).) 30 g 3   LORazepam  (ATIVAN ) 0.5 MG tablet TAKE 1 TABLET BY MOUTH EVERY 6 HOURS AS NEEDED FOR ANXIETY AND FOR SLEEP 45 tablet 0   mirtazapine  (REMERON ) 7.5 MG tablet Take 1 tablet (7.5 mg total) by mouth at bedtime. 30 tablet 0   ondansetron  (ZOFRAN ) 8 MG tablet Take 1 tablet (8 mg total) by mouth every 8 (eight) hours as needed for nausea or vomiting. Start on the third day after chemotherapy. 30 tablet 1   oxyCODONE  (OXY IR/ROXICODONE ) 5 MG immediate release tablet Take 1 tablet (5 mg total) by mouth every 4 (four) hours as needed for severe pain (pain score 7-10). 60 tablet 0   pantoprazole  (PROTONIX ) 40 MG tablet Take 1 tablet (40 mg total) by mouth daily. 30 tablet 0   polyethylene glycol (MIRALAX ) 17 g packet Take 17 g by mouth 2 (two) times daily. 30 each 0   prochlorperazine  (COMPAZINE ) 10 MG tablet Take 1 tablet (10 mg total) by mouth every 6 (six) hours as needed for nausea or vomiting. 30 tablet 1   senna-docusate (SENNA S) 8.6-50 MG tablet Take 1 tablet by mouth daily. 60 tablet 3   No current facility-administered medications for this visit.    HISTORY OF PRESENT ILLNESS:   Oncology History  Colon cancer metastasized to mesenteric lymph nodes (HCC)  06/26/2023 Cancer Staging   Staging form: Colon and Rectum, AJCC 8th Edition - Pathologic stage from 06/26/2023:  Stage IVC (pT4a, pN1b, pM1c) - Signed by Sonja Artemus, MD on 07/25/2023 Residual tumor (R): R1 - Microscopic   07/25/2023 Initial Diagnosis   Colon cancer metastasized to mesenteric lymph nodes (HCC)   08/06/2023 -  Chemotherapy   Patient is on Treatment Plan : COLORECTAL FOLFOX + Bevacizumab  q14d         REVIEW OF SYSTEMS:   Constitutional: Denies fevers, chills or abnormal weight loss Eyes: Denies blurriness of vision Ears, nose, mouth, throat, and face: Denies mucositis or sore throat Respiratory: Denies cough, dyspnea or wheezes Cardiovascular: Denies palpitation, chest discomfort or lower extremity swelling Gastrointestinal:  Denies nausea, heartburn or change in bowel habits Skin: Denies abnormal skin rashes Lymphatics: Denies new lymphadenopathy or easy bruising Neurological:Denies numbness, tingling or new weaknesses Behavioral/Psych: Mood is stable, no new changes  All other systems were reviewed with the patient and are negative.   VITALS:   Today's Vitals   11/19/23 0942 11/19/23 0948  BP: (!) 140/90   Pulse: 84   Resp: 17   Temp: (!) 97.4 F (36.3 C)   SpO2: 99%   Weight: 118 lb 11.2 oz (53.8 kg)   PainSc:  0-No pain   Body mass index is 20.37 kg/m.   Wt Readings from Last 3 Encounters:  11/19/23 118 lb 11.2 oz (53.8 kg)  11/05/23 115 lb 4.8 oz (52.3 kg)  10/22/23 117 lb 3.2 oz (53.2 kg)    Body mass index is 20.37 kg/m.  Performance status (ECOG): 1 - Symptomatic but completely ambulatory  PHYSICAL EXAM:   GENERAL:alert, no distress and comfortable SKIN: skin color, texture, turgor are normal, no rashes or significant lesions EYES: normal, Conjunctiva are pink and non-injected, sclera clear OROPHARYNX:no exudate, no erythema and lips, buccal mucosa, and tongue normal  NECK: supple, thyroid  normal size, non-tender, without nodularity LYMPH:  no palpable lymphadenopathy in the cervical, axillary or inguinal LUNGS: clear to auscultation and percussion  with normal breathing effort HEART: regular rate & rhythm and no murmurs and no lower extremity edema ABDOMEN:abdomen soft, non-tender and normal bowel sounds Musculoskeletal:no cyanosis of digits and no clubbing  NEURO: alert & oriented x 3 with fluent speech, no focal motor/sensory deficits  LABORATORY DATA:  I have reviewed the data as listed    Component Value Date/Time   NA 138 11/19/2023 0924   K 3.5 11/19/2023 0924   CL 103 11/19/2023 0924   CO2 25 11/19/2023 0924   GLUCOSE 105 (H) 11/19/2023 0924   BUN 11 11/19/2023 0924   CREATININE 0.66 11/19/2023 0924   CALCIUM  9.1 11/19/2023 0924   PROT 7.0 11/19/2023 0924   ALBUMIN 3.9 11/19/2023 0924   AST 17 11/19/2023 0924   ALT 8 11/19/2023 0924   ALKPHOS 58 11/19/2023 0924   BILITOT 0.3 11/19/2023 0924   GFRNONAA >60 11/19/2023 0924   GFRAA >60 06/09/2019 1613    Lab Results  Component Value Date   WBC 7.5 11/19/2023   NEUTROABS 3.7 11/19/2023   HGB 11.8 (L) 11/19/2023   HCT 33.1 (L) 11/19/2023   MCV 65.8 (L) 11/19/2023   PLT 187 11/19/2023     RADIOGRAPHIC STUDIES: No results found.

## 2023-11-19 ENCOUNTER — Encounter: Payer: Self-pay | Admitting: Nurse Practitioner

## 2023-11-19 ENCOUNTER — Inpatient Hospital Stay (HOSPITAL_BASED_OUTPATIENT_CLINIC_OR_DEPARTMENT_OTHER): Admitting: Nurse Practitioner

## 2023-11-19 ENCOUNTER — Inpatient Hospital Stay

## 2023-11-19 ENCOUNTER — Inpatient Hospital Stay: Attending: Hematology

## 2023-11-19 VITALS — BP 140/90 | HR 84 | Temp 97.4°F | Resp 17 | Wt 118.7 lb

## 2023-11-19 VITALS — BP 128/85 | HR 62

## 2023-11-19 DIAGNOSIS — Z79899 Other long term (current) drug therapy: Secondary | ICD-10-CM | POA: Insufficient documentation

## 2023-11-19 DIAGNOSIS — C189 Malignant neoplasm of colon, unspecified: Secondary | ICD-10-CM

## 2023-11-19 DIAGNOSIS — Z87891 Personal history of nicotine dependence: Secondary | ICD-10-CM | POA: Diagnosis not present

## 2023-11-19 DIAGNOSIS — D649 Anemia, unspecified: Secondary | ICD-10-CM

## 2023-11-19 DIAGNOSIS — C772 Secondary and unspecified malignant neoplasm of intra-abdominal lymph nodes: Secondary | ICD-10-CM | POA: Diagnosis not present

## 2023-11-19 DIAGNOSIS — G893 Neoplasm related pain (acute) (chronic): Secondary | ICD-10-CM

## 2023-11-19 DIAGNOSIS — Z95828 Presence of other vascular implants and grafts: Secondary | ICD-10-CM

## 2023-11-19 DIAGNOSIS — C787 Secondary malignant neoplasm of liver and intrahepatic bile duct: Secondary | ICD-10-CM | POA: Diagnosis not present

## 2023-11-19 DIAGNOSIS — Z515 Encounter for palliative care: Secondary | ICD-10-CM

## 2023-11-19 DIAGNOSIS — Z5112 Encounter for antineoplastic immunotherapy: Secondary | ICD-10-CM | POA: Insufficient documentation

## 2023-11-19 DIAGNOSIS — Z5111 Encounter for antineoplastic chemotherapy: Secondary | ICD-10-CM | POA: Diagnosis present

## 2023-11-19 DIAGNOSIS — I10 Essential (primary) hypertension: Secondary | ICD-10-CM | POA: Insufficient documentation

## 2023-11-19 DIAGNOSIS — F419 Anxiety disorder, unspecified: Secondary | ICD-10-CM

## 2023-11-19 DIAGNOSIS — R630 Anorexia: Secondary | ICD-10-CM

## 2023-11-19 DIAGNOSIS — G620 Drug-induced polyneuropathy: Secondary | ICD-10-CM | POA: Insufficient documentation

## 2023-11-19 LAB — CMP (CANCER CENTER ONLY)
ALT: 8 U/L (ref 0–44)
AST: 17 U/L (ref 15–41)
Albumin: 3.9 g/dL (ref 3.5–5.0)
Alkaline Phosphatase: 58 U/L (ref 38–126)
Anion gap: 10 (ref 5–15)
BUN: 11 mg/dL (ref 6–20)
CO2: 25 mmol/L (ref 22–32)
Calcium: 9.1 mg/dL (ref 8.9–10.3)
Chloride: 103 mmol/L (ref 98–111)
Creatinine: 0.66 mg/dL (ref 0.44–1.00)
GFR, Estimated: >60 mL/min (ref >60)
Glucose, Bld: 105 mg/dL — ABNORMAL HIGH (ref 70–99)
Potassium: 3.5 mmol/L (ref 3.5–5.1)
Sodium: 138 mmol/L (ref 135–145)
Total Bilirubin: 0.3 mg/dL (ref 0.0–1.2)
Total Protein: 7 g/dL (ref 6.5–8.1)

## 2023-11-19 LAB — FERRITIN: Ferritin: 62 ng/mL (ref 11–307)

## 2023-11-19 LAB — CBC WITH DIFFERENTIAL (CANCER CENTER ONLY)
Abs Immature Granulocytes: 0.01 10*3/uL (ref 0.00–0.07)
Basophils Absolute: 0.1 10*3/uL (ref 0.0–0.1)
Basophils Relative: 1 %
Eosinophils Absolute: 0.1 10*3/uL (ref 0.0–0.5)
Eosinophils Relative: 2 %
HCT: 33.1 % — ABNORMAL LOW (ref 36.0–46.0)
Hemoglobin: 11.8 g/dL — ABNORMAL LOW (ref 12.0–15.0)
Immature Granulocytes: 0 %
Lymphocytes Relative: 35 %
Lymphs Abs: 2.6 10*3/uL (ref 0.7–4.0)
MCH: 23.5 pg — ABNORMAL LOW (ref 26.0–34.0)
MCHC: 35.6 g/dL (ref 30.0–36.0)
MCV: 65.8 fL — ABNORMAL LOW (ref 80.0–100.0)
Monocytes Absolute: 1 10*3/uL (ref 0.1–1.0)
Monocytes Relative: 14 %
Neutro Abs: 3.7 10*3/uL (ref 1.7–7.7)
Neutrophils Relative %: 48 %
Platelet Count: 187 10*3/uL (ref 150–400)
RBC: 5.03 MIL/uL (ref 3.87–5.11)
RDW: 21.1 % — ABNORMAL HIGH (ref 11.5–15.5)
WBC Count: 7.5 10*3/uL (ref 4.0–10.5)
nRBC: 0 % (ref 0.0–0.2)

## 2023-11-19 LAB — TOTAL PROTEIN, URINE DIPSTICK: Protein, ur: NEGATIVE mg/dL

## 2023-11-19 MED ORDER — SODIUM CHLORIDE 0.9 % IV SOLN
2400.0000 mg/m2 | INTRAVENOUS | Status: DC
  Administered 2023-11-19: 3500 mg via INTRAVENOUS
  Filled 2023-11-19: qty 70

## 2023-11-19 MED ORDER — SODIUM CHLORIDE 0.9 % IV SOLN
5.0000 mg/kg | Freq: Once | INTRAVENOUS | Status: AC
  Administered 2023-11-19: 250 mg via INTRAVENOUS
  Filled 2023-11-19: qty 10

## 2023-11-19 MED ORDER — OXALIPLATIN CHEMO INJECTION 100 MG/20ML
85.0000 mg/m2 | Freq: Once | INTRAVENOUS | Status: AC
  Administered 2023-11-19: 130 mg via INTRAVENOUS
  Filled 2023-11-19: qty 6

## 2023-11-19 MED ORDER — DEXTROSE 5 % IV SOLN: INTRAVENOUS | Status: DC

## 2023-11-19 MED ORDER — FLUOROURACIL CHEMO INJECTION 2.5 GM/50ML
400.0000 mg/m2 | Freq: Once | INTRAVENOUS | Status: AC
  Administered 2023-11-19: 600 mg via INTRAVENOUS
  Filled 2023-11-19: qty 12

## 2023-11-19 MED ORDER — LEUCOVORIN CALCIUM INJECTION 350 MG
400.0000 mg/m2 | Freq: Once | INTRAVENOUS | Status: AC
  Administered 2023-11-19: 600 mg via INTRAVENOUS
  Filled 2023-11-19: qty 30

## 2023-11-19 MED ORDER — SODIUM CHLORIDE 0.9 % IV SOLN: INTRAVENOUS | Status: DC

## 2023-11-19 MED ORDER — PALONOSETRON HCL INJECTION 0.25 MG/5ML
0.2500 mg | Freq: Once | INTRAVENOUS | Status: AC
  Administered 2023-11-19: 0.25 mg via INTRAVENOUS
  Filled 2023-11-19: qty 5

## 2023-11-19 MED ORDER — LORAZEPAM 1 MG PO TABS
0.5000 mg | ORAL_TABLET | Freq: Once | ORAL | Status: AC
  Administered 2023-11-19: 0.5 mg via ORAL
  Filled 2023-11-19: qty 1

## 2023-11-19 MED ORDER — SODIUM CHLORIDE 0.9% FLUSH
10.0000 mL | Freq: Once | INTRAVENOUS | Status: AC
  Administered 2023-11-19: 10 mL

## 2023-11-19 MED ORDER — DEXAMETHASONE SODIUM PHOSPHATE 10 MG/ML IJ SOLN
10.0000 mg | Freq: Once | INTRAMUSCULAR | Status: AC
  Administered 2023-11-19: 10 mg via INTRAVENOUS
  Filled 2023-11-19: qty 1

## 2023-11-19 MED ORDER — SODIUM CHLORIDE 0.9% FLUSH
10.0000 mL | INTRAVENOUS | Status: DC | PRN
  Administered 2023-11-19: 10 mL

## 2023-11-19 NOTE — Progress Notes (Signed)
 Palliative Medicine [facility]  [phone] [phone]   Name: Carol Mack Date: 11/19/2023 MRN: [id number]  DOB: 1971-01-16  Patient Care Team: Marius Siemens, NP as PCP - General (Internal Medicine) Sonja Rogers, MD as Consulting Physician (Hematology and Oncology) Joyce Nixon, MD as Consulting Physician (General Surgery) Elois Hair, MD as Consulting Physician (Gastroenterology) [name], Giles Labrum, NP as Nurse Practitioner (Hospice and Palliative Medicine)    INTERVAL HISTORY: Carol Mack is a 53 y.o. female with oncologic medical history including metastatic colon cancer with liver involvement.  Palliative is seeing patient for symptom management and goals of care.   SOCIAL HISTORY:     reports that she has quit smoking. Her smoking use included cigarettes. She started smoking about 29 years ago. She has a 29.3 pack-year smoking history. She has been exposed to tobacco smoke. She has never used smokeless tobacco. She reports that she does not drink alcohol and does not use drugs.  ADVANCE DIRECTIVES:  None on file   CODE STATUS: Full code  PAST MEDICAL HISTORY: Past Medical History:  Diagnosis Date   Anemia    Asthma    Bartholin cyst    Chiari malformation type I (HCC)    Hernia, inguinal    Hernia, inguinal    Hypertension    ILD (interstitial lung disease) (HCC)     ALLERGIES:  is allergic to hydrocodone .  MEDICATIONS:  Current Outpatient Medications  Medication Sig Dispense Refill   albuterol  (VENTOLIN  HFA) 108 (90 Base) MCG/ACT inhaler Inhale 1-2 puffs into the lungs every 6 (six) hours as needed for shortness of breath. 2 each 1   amLODipine  (NORVASC ) 10 MG tablet Take 1 tablet (10 mg total) by mouth daily. 30 tablet 2   dronabinol  (MARINOL ) 2.5 MG capsule Take 2 capsules (5 mg total) by mouth 3 (three) times daily before meals. 90 capsule 1   ferrous sulfate  325 (65 FE) MG tablet Take 1  tablet (325 mg total) by mouth daily with breakfast. 90 tablet 0   lidocaine -prilocaine  (EMLA ) cream Apply to affected area once (Patient taking differently: Apply 1 Application topically as needed (for port access).) 30 g 3   LORazepam  (ATIVAN ) 0.5 MG tablet TAKE 1 TABLET BY MOUTH EVERY 6 HOURS AS NEEDED FOR ANXIETY AND FOR SLEEP 45 tablet 0   mirtazapine  (REMERON ) 7.5 MG tablet Take 1 tablet (7.5 mg total) by mouth at bedtime. 30 tablet 0   ondansetron  (ZOFRAN ) 8 MG tablet Take 1 tablet (8 mg total) by mouth every 8 (eight) hours as needed for nausea or vomiting. Start on the third day after chemotherapy. 30 tablet 1   oxyCODONE  (OXY IR/ROXICODONE ) 5 MG immediate release tablet Take 1 tablet (5 mg total) by mouth every 4 (four) hours as needed for severe pain (pain score 7-10). 60 tablet 0   pantoprazole  (PROTONIX ) 40 MG tablet Take 1 tablet (40 mg total) by mouth daily. 30 tablet 0   polyethylene glycol (MIRALAX ) 17 g packet Take 17 g by mouth 2 (two) times daily. 30 each 0   prochlorperazine  (COMPAZINE ) 10 MG tablet Take 1 tablet (10 mg total) by mouth every 6 (six) hours as needed for nausea or vomiting. 30 tablet 1   senna-docusate (SENNA S) 8.6-50 MG tablet Take 1 tablet by mouth daily. 60 tablet 3   No current facility-administered medications for this visit.   Facility-Administered Medications Ordered in Other Visits  Medication Dose Route Frequency Provider Last Rate Last  Admin   0.9 %  sodium chloride  infusion   Intravenous Continuous Sonja Bellevue, MD   Stopped at 11/19/23 1137   dextrose  5 % solution   Intravenous Continuous Sonja Guinda, MD   Stopped at 11/19/23 1359   fluorouracil  (ADRUCIL ) 3,500 mg in sodium chloride  0.9 % 80 mL chemo infusion  2,400 mg/m2 (Treatment Plan Recorded) Intravenous 1 day or 1 dose Sonja Allouez, MD   Infusion Verify at 11/19/23 1415   sodium chloride  flush (NS) 0.9 % injection 10 mL  10 mL Intracatheter PRN Sonja Dennison, MD   10 mL at 11/19/23 1407    VITAL  SIGNS: LMP  (LMP Unknown)  There were no vitals filed for this visit.  Estimated body mass index is 20.37 kg/m as calculated from the following:   Height as of 11/05/23: 5\' 4"  (1.626 m).   Weight as of an earlier encounter on 11/19/23: 118 lb 11.2 oz (53.8 kg).   PERFORMANCE STATUS (ECOG) : 1 - Symptomatic but completely ambulatory  Physical Exam General: NAD Cardiovascular: regular rate and rhythm Pulmonary: normal breathing pattern Extremities: no edema, no joint deformities Skin: no rashes Neurological: AAO x3  IMPRESSION: Discussed the use of AI scribe software for clinical note transcription with the patient, who gave verbal consent to proceed.  History of Present Illness Carol Mack is a 53 year old female who was seen during infusion. No acute distress. States she is doing well overall. Denies concerns of nausea, vomiting, constipation, or diarrhea. Occasional fatigue. Trying to remain active.  She is managing her anxiety with Ativan  and reports stability with her current medication regimen.   Her appetite is slightly reduced but remains stable, described as 'outside a bit,' which aligns with her usual pattern. Current weight 118lbs up from 115lbs on 4/23.  She reports adequate sleep at night and does not express any additional needs at this time.  Carol Mack reports her pain is well controlled. Denies pain during visit. She is taking oxycodone  as needed. Does not require around the clock. No adjustments at this time.   All questions answered and support provided.  I discussed the importance of continued conversation with family and their medical providers regarding overall plan of care and treatment options, ensuring decisions are within the context of the patients values and GOCs. Assessment & Plan Cancer related pain management Pain is well-managed with oxycodone .  No adjustments to current regimen. - Continue oxycodone  5 mg every four to six hours as  needed.  Anxiety Anxiety significantly improved with lorazepam . - Continue lorazepam  two to three times daily.  Appetite improvement Appetite improving with weight increase. - Prescribe Marinol  2.5 mg for appetite improvement, three times daily with meals. Currently on nationwide back-order  Follow-up Advised to return for follow-up care as needed. - Schedule follow-up in 4-6 weeks.   Patient expressed understanding and was in agreement with this plan. She also understands that She can call the clinic at any time with any questions, concerns, or complaints.   Any controlled substances utilized were prescribed in the context of palliative care. PDMP has been reviewed.   Visit consisted of counseling and education dealing with the complex and emotionally intense issues of symptom management and palliative care in the setting of serious and potentially life-threatening illness.  Dellia Ferguson, AGPCNP-BC  Palliative Medicine Team/[facility]

## 2023-11-21 ENCOUNTER — Inpatient Hospital Stay

## 2023-11-21 VITALS — BP 122/78 | HR 62 | Temp 98.2°F | Resp 18

## 2023-11-21 DIAGNOSIS — C189 Malignant neoplasm of colon, unspecified: Secondary | ICD-10-CM

## 2023-11-21 DIAGNOSIS — Z5112 Encounter for antineoplastic immunotherapy: Secondary | ICD-10-CM | POA: Diagnosis not present

## 2023-11-21 MED ORDER — SODIUM CHLORIDE 0.9% FLUSH
10.0000 mL | INTRAVENOUS | Status: DC | PRN
  Administered 2023-11-21: 10 mL

## 2023-11-21 MED ORDER — HEPARIN SOD (PORK) LOCK FLUSH 100 UNIT/ML IV SOLN
500.0000 [IU] | Freq: Once | INTRAVENOUS | Status: AC | PRN
  Administered 2023-11-21: 500 [IU]

## 2023-11-23 ENCOUNTER — Encounter: Payer: Self-pay | Admitting: Hematology

## 2023-11-23 ENCOUNTER — Encounter: Payer: Self-pay | Admitting: Nurse Practitioner

## 2023-11-26 ENCOUNTER — Ambulatory Visit: Admitting: Hematology

## 2023-11-26 ENCOUNTER — Other Ambulatory Visit

## 2023-11-26 ENCOUNTER — Ambulatory Visit

## 2023-11-28 ENCOUNTER — Encounter

## 2023-12-03 ENCOUNTER — Encounter: Payer: Self-pay | Admitting: Hematology

## 2023-12-03 ENCOUNTER — Inpatient Hospital Stay

## 2023-12-03 ENCOUNTER — Inpatient Hospital Stay (HOSPITAL_BASED_OUTPATIENT_CLINIC_OR_DEPARTMENT_OTHER): Admitting: Hematology

## 2023-12-03 VITALS — BP 130/78 | HR 85 | Temp 98.0°F | Resp 21 | Ht 64.0 in | Wt 116.6 lb

## 2023-12-03 DIAGNOSIS — Z5112 Encounter for antineoplastic immunotherapy: Secondary | ICD-10-CM | POA: Diagnosis not present

## 2023-12-03 DIAGNOSIS — C189 Malignant neoplasm of colon, unspecified: Secondary | ICD-10-CM

## 2023-12-03 DIAGNOSIS — C772 Secondary and unspecified malignant neoplasm of intra-abdominal lymph nodes: Secondary | ICD-10-CM | POA: Diagnosis not present

## 2023-12-03 DIAGNOSIS — Z95828 Presence of other vascular implants and grafts: Secondary | ICD-10-CM

## 2023-12-03 LAB — CBC WITH DIFFERENTIAL (CANCER CENTER ONLY)
Abs Immature Granulocytes: 0.01 10*3/uL (ref 0.00–0.07)
Basophils Absolute: 0.1 10*3/uL (ref 0.0–0.1)
Basophils Relative: 1 %
Eosinophils Absolute: 0.1 10*3/uL (ref 0.0–0.5)
Eosinophils Relative: 2 %
HCT: 35.1 % — ABNORMAL LOW (ref 36.0–46.0)
Hemoglobin: 12.3 g/dL (ref 12.0–15.0)
Immature Granulocytes: 0 %
Lymphocytes Relative: 45 %
Lymphs Abs: 2.7 10*3/uL (ref 0.7–4.0)
MCH: 23.4 pg — ABNORMAL LOW (ref 26.0–34.0)
MCHC: 35 g/dL (ref 30.0–36.0)
MCV: 66.9 fL — ABNORMAL LOW (ref 80.0–100.0)
Monocytes Absolute: 0.9 10*3/uL (ref 0.1–1.0)
Monocytes Relative: 16 %
Neutro Abs: 2.1 10*3/uL (ref 1.7–7.7)
Neutrophils Relative %: 36 %
Platelet Count: 192 10*3/uL (ref 150–400)
RBC: 5.25 MIL/uL — ABNORMAL HIGH (ref 3.87–5.11)
RDW: 20.4 % — ABNORMAL HIGH (ref 11.5–15.5)
WBC Count: 5.9 10*3/uL (ref 4.0–10.5)
nRBC: 0 % (ref 0.0–0.2)

## 2023-12-03 LAB — CMP (CANCER CENTER ONLY)
ALT: 8 U/L (ref 0–44)
AST: 15 U/L (ref 15–41)
Albumin: 3.9 g/dL (ref 3.5–5.0)
Alkaline Phosphatase: 65 U/L (ref 38–126)
Anion gap: 4 — ABNORMAL LOW (ref 5–15)
BUN: 12 mg/dL (ref 6–20)
CO2: 28 mmol/L (ref 22–32)
Calcium: 9.3 mg/dL (ref 8.9–10.3)
Chloride: 107 mmol/L (ref 98–111)
Creatinine: 0.72 mg/dL (ref 0.44–1.00)
GFR, Estimated: >60 mL/min (ref >60)
Glucose, Bld: 92 mg/dL (ref 70–99)
Potassium: 3.8 mmol/L (ref 3.5–5.1)
Sodium: 139 mmol/L (ref 135–145)
Total Bilirubin: 0.3 mg/dL (ref 0.0–1.2)
Total Protein: 7.1 g/dL (ref 6.5–8.1)

## 2023-12-03 LAB — TOTAL PROTEIN, URINE DIPSTICK: Protein, ur: NEGATIVE mg/dL

## 2023-12-03 MED ORDER — SODIUM CHLORIDE 0.9 % IV SOLN
5.0000 mg/kg | Freq: Once | INTRAVENOUS | Status: AC
  Administered 2023-12-03: 250 mg via INTRAVENOUS
  Filled 2023-12-03: qty 10

## 2023-12-03 MED ORDER — PALONOSETRON HCL INJECTION 0.25 MG/5ML
0.2500 mg | Freq: Once | INTRAVENOUS | Status: AC
  Administered 2023-12-03: 0.25 mg via INTRAVENOUS
  Filled 2023-12-03: qty 5

## 2023-12-03 MED ORDER — SODIUM CHLORIDE 0.9 % IV SOLN
INTRAVENOUS | Status: DC
Start: 2023-12-03 — End: 2023-12-03

## 2023-12-03 MED ORDER — DEXTROSE 5 % IV SOLN: INTRAVENOUS | Status: DC

## 2023-12-03 MED ORDER — SODIUM CHLORIDE 0.9% FLUSH: 10.0000 mL | INTRAVENOUS | Status: DC | PRN

## 2023-12-03 MED ORDER — DEXAMETHASONE SODIUM PHOSPHATE 10 MG/ML IJ SOLN
10.0000 mg | Freq: Once | INTRAMUSCULAR | Status: AC
  Administered 2023-12-03: 10 mg via INTRAVENOUS
  Filled 2023-12-03: qty 1

## 2023-12-03 MED ORDER — FLUOROURACIL CHEMO INJECTION 2.5 GM/50ML
400.0000 mg/m2 | Freq: Once | INTRAVENOUS | Status: AC
  Administered 2023-12-03: 600 mg via INTRAVENOUS
  Filled 2023-12-03: qty 12

## 2023-12-03 MED ORDER — LORAZEPAM 1 MG PO TABS
0.5000 mg | ORAL_TABLET | Freq: Once | ORAL | Status: AC
  Administered 2023-12-03: 0.5 mg via ORAL
  Filled 2023-12-03: qty 1

## 2023-12-03 MED ORDER — HEPARIN SOD (PORK) LOCK FLUSH 100 UNIT/ML IV SOLN: 500.0000 [IU] | Freq: Once | INTRAVENOUS | Status: DC | PRN

## 2023-12-03 MED ORDER — SODIUM CHLORIDE 0.9 % IV SOLN
2400.0000 mg/m2 | INTRAVENOUS | Status: DC
  Administered 2023-12-03: 3500 mg via INTRAVENOUS
  Filled 2023-12-03: qty 70

## 2023-12-03 MED ORDER — LEUCOVORIN CALCIUM INJECTION 350 MG
400.0000 mg/m2 | Freq: Once | INTRAVENOUS | Status: AC
  Administered 2023-12-03: 600 mg via INTRAVENOUS
  Filled 2023-12-03: qty 30

## 2023-12-03 MED ORDER — AMLODIPINE BESYLATE 10 MG PO TABS: 10.0000 mg | ORAL_TABLET | Freq: Every day | ORAL | 2 refills | Status: DC

## 2023-12-03 MED ORDER — OXALIPLATIN CHEMO INJECTION 100 MG/20ML
85.0000 mg/m2 | Freq: Once | INTRAVENOUS | Status: AC
  Administered 2023-12-03: 130 mg via INTRAVENOUS
  Filled 2023-12-03: qty 6

## 2023-12-03 MED ORDER — SODIUM CHLORIDE 0.9% FLUSH
10.0000 mL | Freq: Once | INTRAVENOUS | Status: AC
  Administered 2023-12-03: 10 mL

## 2023-12-03 NOTE — Patient Instructions (Signed)
 [facility] - A DEPT OF [facility]  Discharge Instructions: Thank you for choosing [facility] to provide your oncology and hematology care.   If you have a lab appointment with the Cancer Center, please go directly to the Cancer Center and check in at the registration area.   Wear comfortable clothing and clothing appropriate for easy access to any Portacath or PICC line.   We strive to give you quality time with your provider. You may need to reschedule your appointment if you arrive late (15 or more minutes).  Arriving late affects you and other patients whose appointments are after yours.  Also, if you miss three or more appointments without notifying the office, you may be dismissed from the clinic at the provider's discretion.      For prescription refill requests, have your pharmacy contact our office and allow 72 hours for refills to be completed.    Today you received the following chemotherapy and/or immunotherapy agents: Vegzelma , Oxaliplatin  Leucovorin , Fluorouracil .    To help prevent nausea and vomiting after your treatment, we encourage you to take your nausea medication as directed.  BELOW ARE SYMPTOMS THAT SHOULD BE REPORTED IMMEDIATELY: *FEVER GREATER THAN 100.4 F (38 C) OR HIGHER *CHILLS OR SWEATING *NAUSEA AND VOMITING THAT IS NOT CONTROLLED WITH YOUR NAUSEA MEDICATION *UNUSUAL SHORTNESS OF BREATH *UNUSUAL BRUISING OR BLEEDING *URINARY PROBLEMS (pain or burning when urinating, or frequent urination) *BOWEL PROBLEMS (unusual diarrhea, constipation, pain near the anus) TENDERNESS IN MOUTH AND THROAT WITH OR WITHOUT PRESENCE OF ULCERS (sore throat, sores in mouth, or a toothache) UNUSUAL RASH, SWELLING OR PAIN  UNUSUAL VAGINAL DISCHARGE OR ITCHING   Items with * indicate a potential emergency and should be followed up as soon as possible or go to the Emergency Department if any problems should occur.  Please show the  CHEMOTHERAPY ALERT CARD or IMMUNOTHERAPY ALERT CARD at check-in to the Emergency Department and triage nurse.  Should you have questions after your visit or need to cancel or reschedule your appointment, please contact [facility] - A DEPT OF [facility]  Dept: [phone]  and follow the prompts.  Office hours are 8:00 a.m. to 4:30 p.m. Monday - Friday. Please note that voicemails left after 4:00 p.m. may not be returned until the following business day.  We are closed weekends and major holidays. You have access to a nurse at all times for urgent questions. Please call the main number to the clinic Dept: [phone] and follow the prompts.   For any non-urgent questions, you may also contact your provider using MyChart. We now offer e-Visits for anyone 69 and older to request care online for non-urgent symptoms. For details visit [email or web address].   Also download the MyChart app! Go to the app store, search "MyChart", open the app, select [place], and log in with your MyChart username and password.

## 2023-12-03 NOTE — Assessment & Plan Note (Addendum)
-  [id number] with mesentery node and possible liver metastasis, G2, MMR proficient, KRAS G12D mutation (+) -Patient initially presented with abdominal pain to the emergency room, underwent a colonoscopy in October 2024 which showed a large mass in the splenic lecture of left side colon. -She underwent left hemicolectomy on June 26, 2023.  Large mesenteric mass was also biopsied which confirmed metastatic adenocarcinoma. -Staging CT scan and a liver MRI showed a suspicious 0.8 cm mass in the left lobe of liver, concerning for metastasis.  No other evidence of distant metastatic disease. Liver biopsy on 08/27/2023 was benign.  -I discussed her metastatic disease and the probable incurable nature of her disease. -I recommend systemic treatment with chemotherapy FOLFOX, she started on 08/06/23. -NGS [facility] showed KRAS G12D and PIK3CA mutations, not a candidate for targeted therapy  -Her tumor was tested of for MMR which were normal, (+) KRAS mutation, not a candidate for targeted or immunotherapy. Beva added from cycle 2  -CT 10/20/2023 was negative for residual disease

## 2023-12-03 NOTE — Progress Notes (Signed)
 [facility]   Telephone:(336) [phone] Fax:(336) [phone]   Clinic Follow up Note   Patient Care Team: Marius Siemens, NP as PCP - General (Internal Medicine) Sonja Carrier, MD as Consulting Physician (Hematology and Oncology) Joyce Nixon, MD as Consulting Physician (General Surgery) Elois Hair, MD as Consulting Physician (Gastroenterology) [name], Giles Labrum, NP as Nurse Practitioner [facility] and Palliative Medicine)  Date of Service:  12/03/2023  CHIEF COMPLAINT: f/u of colon cancer  CURRENT THERAPY:  Chemotherapy FOLFOX and bevacizumab   Oncology History   Colon cancer metastasized to mesenteric lymph nodes (HCC) -[id number] with mesentery node and possible liver metastasis, G2, MMR proficient, KRAS G12D mutation (+) -Patient initially presented with abdominal pain to the emergency room, underwent a colonoscopy in October 2024 which showed a large mass in the splenic lecture of left side colon. -She underwent left hemicolectomy on June 26, 2023.  Large mesenteric mass was also biopsied which confirmed metastatic adenocarcinoma. -Staging CT scan and a liver MRI showed a suspicious 0.8 cm mass in the left lobe of liver, concerning for metastasis.  No other evidence of distant metastatic disease. Liver biopsy on 08/27/2023 was benign.  -I discussed her metastatic disease and the probable incurable nature of her disease. -I recommend systemic treatment with chemotherapy FOLFOX, she started on 08/06/23. -NGS [facility] showed KRAS G12D and PIK3CA mutations, not a candidate for targeted therapy  -Her tumor was tested of for MMR which were normal, (+) KRAS mutation, not a candidate for targeted or immunotherapy. Beva added from cycle 2  -CT 10/20/2023 was negative for residual disease    Assessment and Plan Assessment & Plan Colon cancer undergoing chemotherapy Rectal cancer in cycle nine of twelve planned chemotherapy cycles. She reports good appetite,  stable weight, and experiences fatigue post-chemotherapy but recovers. Blood counts are good with normal hemoglobin. Kidney and liver function tests are pending. Concerns about cancer recurrence were discussed, with reassurance provided about the current absence of detectable cancer cells. Plans to reduce chemotherapy dose slightly for the last three cycles due to peripheral neuropathy symptoms. - Continue chemotherapy for a total of twelve cycles, with the last treatment scheduled for January 13, 2024. - Perform a scan in October 2025 to monitor for cancer recurrence. - Monitor blood tests for cancer recurrence. - Encourage exercise to improve strength post-chemotherapy.  Peripheral neuropathy due to chemotherapy Reports numbness and tingling in fingers and feet, exacerbated by cold exposure. Symptoms are bothersome but not severe. Skin discoloration on hands and feet attributed to chemotherapy, expected to resolve post-treatment. - Slightly reduce chemotherapy dose for the next three cycles to manage peripheral neuropathy symptoms.  Hypertension Requires refills for amlodipine . No acute issues with blood pressure management. - Refill amlodipine  prescription at preferred pharmacy.  Plan - Lab reviewed, adequate for treatment, will proceed cycle 9 chemotherapy today - Lab and follow-up in 2 weeks before cycle 10, plan to slightly reduce oxaliplatin  dose from next cycle due to neuropathy   SUMMARY OF ONCOLOGIC HISTORY: Oncology History  Colon cancer metastasized to mesenteric lymph nodes (HCC)  06/26/2023 Cancer Staging   Staging form: Colon and Rectum, AJCC 8th Edition - Pathologic stage from 06/26/2023: Stage IVC (pT4a, pN1b, pM1c) - Signed by Sonja Yakima, MD on 07/25/2023 Residual tumor (R): R1 - Microscopic   07/25/2023 Initial Diagnosis   Colon cancer metastasized to mesenteric lymph nodes (HCC)   08/06/2023 -  Chemotherapy   Patient is on Treatment Plan : COLORECTAL FOLFOX + Bevacizumab   q14d  Discussed the use of AI scribe software for clinical note transcription with the patient, who gave verbal consent to proceed.  History of Present Illness Carol Mack is a 53 year old female with rectal cancer who presents for follow-up.  She is currently undergoing chemotherapy and is in cycle nine of a planned twelve-cycle regimen. She experiences numbness and tingling in her fingers and feet, which worsens with cold exposure, leading to difficulty holding items. Hyperpigmentation of her hands and feet is present, attributed to chemotherapy. Fatigue is significant, particularly on the second day post-treatment, but she recovers thereafter.  Her weight is stable at 116 pounds, consistent with her weight four weeks ago, though slightly down from 118 pounds at her last visit. She maintains a good appetite and denies any significant weight loss.  She is on amlodipine  for blood pressure management and also takes lorazepam . No dizziness is reported.     All other systems were reviewed with the patient and are negative.  MEDICAL HISTORY:  Past Medical History:  Diagnosis Date   Anemia    Asthma    Bartholin cyst    Chiari malformation type I (HCC)    Hernia, inguinal    Hernia, inguinal    Hypertension    ILD (interstitial lung disease) (HCC)     SURGICAL HISTORY: Past Surgical History:  Procedure Laterality Date   fibroid freezing     unsure   IR GENERIC HISTORICAL  07/27/2014   IR RADIOLOGIST EVAL & MGMT 07/27/2014 Elene Griffes, MD [facility] INTERV RAD   IR PORT REPAIR CENTRAL VENOUS ACCESS DEVICE  10/06/2023   PORTACATH PLACEMENT N/A 07/31/2023   Procedure: PLACEMENT OF PORT A CATHETER UNDER ULTRASOUND AND FLUOROSCOPIC GUIDANCE;  Surgeon: Candyce Champagne, MD;  Location: [facility];  Service: General;  Laterality: N/A;  GEN WITH ERAS 1.5 HOURS TOTAL   ROBOTIC ASSISTED TOTAL HYSTERECTOMY N/A 09/15/2018   Procedure: XI ROBOTIC ASSISTED TOTAL HYSTERECTOMY WITH SALPINGECTOMY;  Surgeon:  Lenord Radon, MD;  Location: [facility];  Service: Gynecology;  Laterality: N/A;    I have reviewed the social history and family history with the patient and they are unchanged from previous note.  ALLERGIES:  is allergic to hydrocodone .  MEDICATIONS:  Current Outpatient Medications  Medication Sig Dispense Refill   albuterol  (VENTOLIN  HFA) 108 (90 Base) MCG/ACT inhaler Inhale 1-2 puffs into the lungs every 6 (six) hours as needed for shortness of breath. 2 each 1   dronabinol  (MARINOL ) 2.5 MG capsule Take 2 capsules (5 mg total) by mouth 3 (three) times daily before meals. 90 capsule 1   ferrous sulfate  325 (65 FE) MG tablet Take 1 tablet (325 mg total) by mouth daily with breakfast. 90 tablet 0   lidocaine -prilocaine  (EMLA ) cream Apply to affected area once (Patient taking differently: Apply 1 Application topically as needed (for port access).) 30 g 3   LORazepam  (ATIVAN ) 0.5 MG tablet TAKE 1 TABLET BY MOUTH EVERY 6 HOURS AS NEEDED FOR ANXIETY AND FOR SLEEP 45 tablet 0   mirtazapine  (REMERON ) 7.5 MG tablet Take 1 tablet (7.5 mg total) by mouth at bedtime. 30 tablet 0   ondansetron  (ZOFRAN ) 8 MG tablet Take 1 tablet (8 mg total) by mouth every 8 (eight) hours as needed for nausea or vomiting. Start on the third day after chemotherapy. 30 tablet 1   oxyCODONE  (OXY IR/ROXICODONE ) 5 MG immediate release tablet Take 1 tablet (5 mg total) by mouth every 4 (four) hours as needed for severe pain (pain  score 7-10). 60 tablet 0   pantoprazole  (PROTONIX ) 40 MG tablet Take 1 tablet (40 mg total) by mouth daily. 30 tablet 0   polyethylene glycol (MIRALAX ) 17 g packet Take 17 g by mouth 2 (two) times daily. 30 each 0   prochlorperazine  (COMPAZINE ) 10 MG tablet Take 1 tablet (10 mg total) by mouth every 6 (six) hours as needed for nausea or vomiting. 30 tablet 1   senna-docusate (SENNA S) 8.6-50 MG tablet Take 1 tablet by mouth daily. 60 tablet 3   amLODipine  (NORVASC ) 10 MG  tablet Take 1 tablet (10 mg total) by mouth daily. 30 tablet 2   No current facility-administered medications for this visit.    PHYSICAL EXAMINATION: ECOG PERFORMANCE STATUS: 1 - Symptomatic but completely ambulatory  Vitals:   12/03/23 1056  BP: 130/78  Pulse: 85  Resp: (!) 21  Temp: 98 F (36.7 C)  SpO2: 98%   Wt Readings from Last 3 Encounters:  12/03/23 116 lb 9.6 oz (52.9 kg)  11/19/23 118 lb 11.2 oz (53.8 kg)  11/05/23 115 lb 4.8 oz (52.3 kg)     GENERAL:alert, no distress and comfortable SKIN: skin color, texture, turgor are normal, no rashes or significant lesions EYES: normal, Conjunctiva are pink and non-injected, sclera clear NECK: supple, thyroid  normal size, non-tender, without nodularity LYMPH:  no palpable lymphadenopathy in the cervical, axillary  LUNGS: clear to auscultation and percussion with normal breathing effort HEART: regular rate & rhythm and no murmurs and no lower extremity edema ABDOMEN:abdomen soft, non-tender and normal bowel sounds Musculoskeletal:no cyanosis of digits and no clubbing  NEURO: alert & oriented x 3 with fluent speech, no focal motor/sensory deficits  Physical Exam MEASUREMENTS: Weight- 116. NEUROLOGICAL: Sensation intact bilaterally.  LABORATORY DATA:  I have reviewed the data as listed    Latest Ref Rng & Units 12/03/2023   10:30 AM 11/19/2023    9:24 AM 11/05/2023   10:31 AM  CBC  WBC 4.0 - 10.5 K/uL 5.9  7.5  5.8   Hemoglobin 12.0 - 15.0 g/dL 16.1  09.6  04.5   Hematocrit 36.0 - 46.0 % 35.1  33.1  33.4   Platelets 150 - 400 K/uL 192  187  210         Latest Ref Rng & Units 12/03/2023   10:30 AM 11/19/2023    9:24 AM 11/05/2023   10:31 AM  CMP  Glucose 70 - 99 mg/dL 92  409  92   BUN 6 - 20 mg/dL 12  11  11    Creatinine 0.44 - 1.00 mg/dL 8.11  9.14  7.82   Sodium 135 - 145 mmol/L 139  138  140   Potassium 3.5 - 5.1 mmol/L 3.8  3.5  4.2   Chloride 98 - 111 mmol/L 107  103  108   CO2 22 - 32 mmol/L 28  25  27     Calcium  8.9 - 10.3 mg/dL 9.3  9.1  9.1   Total Protein 6.5 - 8.1 g/dL 7.1  7.0  7.0   Total Bilirubin 0.0 - 1.2 mg/dL 0.3  0.3  0.3   Alkaline Phos 38 - 126 U/L 65  58  57   AST 15 - 41 U/L 15  17  13    ALT 0 - 44 U/L 8  8  6        RADIOGRAPHIC STUDIES: I have personally reviewed the radiological images as listed and agreed with the findings in the report. No results found.  No orders of the defined types were placed in this encounter.  All questions were answered. The patient knows to call the clinic with any problems, questions or concerns. No barriers to learning was detected. The total time spent in the appointment was 30 minutes, including review of chart and various tests results, discussions about plan of care and coordination of care plan     Sonja Rockwood, MD 12/03/2023

## 2023-12-05 ENCOUNTER — Inpatient Hospital Stay

## 2023-12-05 VITALS — BP 135/98 | HR 87 | Temp 98.2°F | Resp 16

## 2023-12-05 DIAGNOSIS — Z5112 Encounter for antineoplastic immunotherapy: Secondary | ICD-10-CM | POA: Diagnosis not present

## 2023-12-05 DIAGNOSIS — Z95828 Presence of other vascular implants and grafts: Secondary | ICD-10-CM

## 2023-12-05 MED ORDER — SODIUM CHLORIDE 0.9% FLUSH
10.0000 mL | Freq: Once | INTRAVENOUS | Status: AC
  Administered 2023-12-05: 10 mL

## 2023-12-05 MED ORDER — HEPARIN SOD (PORK) LOCK FLUSH 100 UNIT/ML IV SOLN
500.0000 [IU] | Freq: Once | INTRAVENOUS | Status: AC
  Administered 2023-12-05: 500 [IU]

## 2023-12-16 NOTE — Assessment & Plan Note (Signed)
-  [id number] with mesentery node and possible liver metastasis, G2, MMR proficient, KRAS G12D mutation (+) -Patient initially presented with abdominal pain to the emergency room, underwent a colonoscopy in October 2024 which showed a large mass in the splenic lecture of left side colon. -She underwent left hemicolectomy on June 26, 2023.  Large mesenteric mass was also biopsied which confirmed metastatic adenocarcinoma. -Staging CT scan and a liver MRI showed a suspicious 0.8 cm mass in the left lobe of liver, concerning for metastasis.  No other evidence of distant metastatic disease. Liver biopsy on 08/27/2023 was benign.  -I discussed her metastatic disease and the probable incurable nature of her disease. -I recommend systemic treatment with chemotherapy FOLFOX, she started on 08/06/23. -NGS [facility] showed KRAS G12D and PIK3CA mutations, not a candidate for targeted therapy  -Her tumor was tested of for MMR which were normal, (+) KRAS mutation, not a candidate for targeted or immunotherapy. Beva added from cycle 2  -CT 10/20/2023 was negative for residual disease

## 2023-12-17 ENCOUNTER — Inpatient Hospital Stay (HOSPITAL_BASED_OUTPATIENT_CLINIC_OR_DEPARTMENT_OTHER): Admitting: Nurse Practitioner

## 2023-12-17 ENCOUNTER — Encounter: Payer: Self-pay | Admitting: Hematology

## 2023-12-17 ENCOUNTER — Inpatient Hospital Stay: Attending: Hematology

## 2023-12-17 ENCOUNTER — Inpatient Hospital Stay

## 2023-12-17 ENCOUNTER — Ambulatory Visit: Admitting: Hematology

## 2023-12-17 ENCOUNTER — Inpatient Hospital Stay (HOSPITAL_BASED_OUTPATIENT_CLINIC_OR_DEPARTMENT_OTHER): Admitting: Hematology

## 2023-12-17 ENCOUNTER — Encounter: Payer: Self-pay | Admitting: Nurse Practitioner

## 2023-12-17 ENCOUNTER — Other Ambulatory Visit

## 2023-12-17 VITALS — BP 142/86 | HR 69 | Temp 97.8°F | Resp 16 | Wt 119.0 lb

## 2023-12-17 DIAGNOSIS — Z5112 Encounter for antineoplastic immunotherapy: Secondary | ICD-10-CM | POA: Insufficient documentation

## 2023-12-17 DIAGNOSIS — Z95828 Presence of other vascular implants and grafts: Secondary | ICD-10-CM

## 2023-12-17 DIAGNOSIS — T451X5A Adverse effect of antineoplastic and immunosuppressive drugs, initial encounter: Secondary | ICD-10-CM | POA: Insufficient documentation

## 2023-12-17 DIAGNOSIS — C189 Malignant neoplasm of colon, unspecified: Secondary | ICD-10-CM

## 2023-12-17 DIAGNOSIS — Z5111 Encounter for antineoplastic chemotherapy: Secondary | ICD-10-CM | POA: Diagnosis present

## 2023-12-17 DIAGNOSIS — G893 Neoplasm related pain (acute) (chronic): Secondary | ICD-10-CM | POA: Insufficient documentation

## 2023-12-17 DIAGNOSIS — R530 Neoplastic (malignant) related fatigue: Secondary | ICD-10-CM | POA: Diagnosis not present

## 2023-12-17 DIAGNOSIS — C772 Secondary and unspecified malignant neoplasm of intra-abdominal lymph nodes: Secondary | ICD-10-CM | POA: Diagnosis not present

## 2023-12-17 DIAGNOSIS — G620 Drug-induced polyneuropathy: Secondary | ICD-10-CM | POA: Insufficient documentation

## 2023-12-17 DIAGNOSIS — Z79899 Other long term (current) drug therapy: Secondary | ICD-10-CM | POA: Insufficient documentation

## 2023-12-17 DIAGNOSIS — Z515 Encounter for palliative care: Secondary | ICD-10-CM

## 2023-12-17 DIAGNOSIS — R630 Anorexia: Secondary | ICD-10-CM | POA: Diagnosis not present

## 2023-12-17 DIAGNOSIS — G4709 Other insomnia: Secondary | ICD-10-CM

## 2023-12-17 DIAGNOSIS — C186 Malignant neoplasm of descending colon: Secondary | ICD-10-CM | POA: Insufficient documentation

## 2023-12-17 DIAGNOSIS — F419 Anxiety disorder, unspecified: Secondary | ICD-10-CM

## 2023-12-17 LAB — TOTAL PROTEIN, URINE DIPSTICK: Protein, ur: NEGATIVE mg/dL

## 2023-12-17 LAB — CBC WITH DIFFERENTIAL (CANCER CENTER ONLY)
Abs Immature Granulocytes: 0.01 10*3/uL (ref 0.00–0.07)
Basophils Absolute: 0.1 10*3/uL (ref 0.0–0.1)
Basophils Relative: 1 %
Eosinophils Absolute: 0.1 10*3/uL (ref 0.0–0.5)
Eosinophils Relative: 2 %
HCT: 35.6 % — ABNORMAL LOW (ref 36.0–46.0)
Hemoglobin: 12.5 g/dL (ref 12.0–15.0)
Immature Granulocytes: 0 %
Lymphocytes Relative: 44 %
Lymphs Abs: 2.9 10*3/uL (ref 0.7–4.0)
MCH: 23.9 pg — ABNORMAL LOW (ref 26.0–34.0)
MCHC: 35.1 g/dL (ref 30.0–36.0)
MCV: 68.2 fL — ABNORMAL LOW (ref 80.0–100.0)
Monocytes Absolute: 1.3 10*3/uL — ABNORMAL HIGH (ref 0.1–1.0)
Monocytes Relative: 19 %
Neutro Abs: 2.3 10*3/uL (ref 1.7–7.7)
Neutrophils Relative %: 34 %
Platelet Count: 209 10*3/uL (ref 150–400)
RBC: 5.22 MIL/uL — ABNORMAL HIGH (ref 3.87–5.11)
RDW: 20.2 % — ABNORMAL HIGH (ref 11.5–15.5)
WBC Count: 6.6 10*3/uL (ref 4.0–10.5)
nRBC: 0 % (ref 0.0–0.2)

## 2023-12-17 LAB — CMP (CANCER CENTER ONLY)
ALT: 9 U/L (ref 0–44)
AST: 17 U/L (ref 15–41)
Albumin: 4 g/dL (ref 3.5–5.0)
Alkaline Phosphatase: 58 U/L (ref 38–126)
Anion gap: 6 (ref 5–15)
BUN: 11 mg/dL (ref 6–20)
CO2: 27 mmol/L (ref 22–32)
Calcium: 9.4 mg/dL (ref 8.9–10.3)
Chloride: 107 mmol/L (ref 98–111)
Creatinine: 0.66 mg/dL (ref 0.44–1.00)
GFR, Estimated: >60 mL/min (ref >60)
Glucose, Bld: 112 mg/dL — ABNORMAL HIGH (ref 70–99)
Potassium: 3.7 mmol/L (ref 3.5–5.1)
Sodium: 140 mmol/L (ref 135–145)
Total Bilirubin: 0.4 mg/dL (ref 0.0–1.2)
Total Protein: 7.4 g/dL (ref 6.5–8.1)

## 2023-12-17 MED ORDER — LEUCOVORIN CALCIUM INJECTION 350 MG
400.0000 mg/m2 | Freq: Once | INTRAVENOUS | Status: AC
  Administered 2023-12-17: 600 mg via INTRAVENOUS
  Filled 2023-12-17: qty 30

## 2023-12-17 MED ORDER — DEXTROSE 5 % IV SOLN: INTRAVENOUS | Status: DC

## 2023-12-17 MED ORDER — SODIUM CHLORIDE 0.9 % IV SOLN: INTRAVENOUS | Status: DC

## 2023-12-17 MED ORDER — OXYCODONE HCL 5 MG PO TABS: 5.0000 mg | ORAL_TABLET | ORAL | 0 refills | Status: DC | PRN

## 2023-12-17 MED ORDER — SODIUM CHLORIDE 0.9 % IV SOLN
2400.0000 mg/m2 | INTRAVENOUS | Status: DC
  Administered 2023-12-17: 3500 mg via INTRAVENOUS
  Filled 2023-12-17: qty 70

## 2023-12-17 MED ORDER — SODIUM CHLORIDE 0.9% FLUSH
10.0000 mL | INTRAVENOUS | Status: DC | PRN
  Administered 2023-12-17: 10 mL

## 2023-12-17 MED ORDER — DEXAMETHASONE SODIUM PHOSPHATE 10 MG/ML IJ SOLN
10.0000 mg | Freq: Once | INTRAMUSCULAR | Status: AC
  Administered 2023-12-17: 10 mg via INTRAVENOUS
  Filled 2023-12-17: qty 1

## 2023-12-17 MED ORDER — LORAZEPAM 0.5 MG PO TABS: 0.5000 mg | ORAL_TABLET | Freq: Three times a day (TID) | ORAL | 0 refills | Status: DC | PRN

## 2023-12-17 MED ORDER — SODIUM CHLORIDE 0.9 % IV SOLN
5.0000 mg/kg | Freq: Once | INTRAVENOUS | Status: AC
  Administered 2023-12-17: 250 mg via INTRAVENOUS
  Filled 2023-12-17: qty 10

## 2023-12-17 MED ORDER — SODIUM CHLORIDE 0.9% FLUSH
10.0000 mL | Freq: Once | INTRAVENOUS | Status: AC
  Administered 2023-12-17: 10 mL

## 2023-12-17 MED ORDER — FLUOROURACIL CHEMO INJECTION 2.5 GM/50ML
400.0000 mg/m2 | Freq: Once | INTRAVENOUS | Status: AC
  Administered 2023-12-17: 600 mg via INTRAVENOUS
  Filled 2023-12-17: qty 12

## 2023-12-17 MED ORDER — LORAZEPAM 1 MG PO TABS
0.5000 mg | ORAL_TABLET | Freq: Once | ORAL | Status: AC
  Administered 2023-12-17: 0.5 mg via ORAL
  Filled 2023-12-17: qty 1

## 2023-12-17 MED ORDER — PALONOSETRON HCL INJECTION 0.25 MG/5ML
0.2500 mg | Freq: Once | INTRAVENOUS | Status: AC
  Administered 2023-12-17: 0.25 mg via INTRAVENOUS
  Filled 2023-12-17: qty 5

## 2023-12-17 NOTE — Patient Instructions (Signed)
 [facility] - A DEPT OF [facility]  Discharge Instructions: Thank you for choosing [facility] to provide your oncology and hematology care.   If you have a lab appointment with the Cancer Center, please go directly to the Cancer Center and check in at the registration area.   Wear comfortable clothing and clothing appropriate for easy access to any Portacath or PICC line.   We strive to give you quality time with your provider. You may need to reschedule your appointment if you arrive late (15 or more minutes).  Arriving late affects you and other patients whose appointments are after yours.  Also, if you miss three or more appointments without notifying the office, you may be dismissed from the clinic at the provider's discretion.      For prescription refill requests, have your pharmacy contact our office and allow 72 hours for refills to be completed.    Today you received the following chemotherapy and/or immunotherapy agents: Vegzelma , Leucovorin , Fluorouracil .    To help prevent nausea and vomiting after your treatment, we encourage you to take your nausea medication as directed.  BELOW ARE SYMPTOMS THAT SHOULD BE REPORTED IMMEDIATELY: *FEVER GREATER THAN 100.4 F (38 C) OR HIGHER *CHILLS OR SWEATING *NAUSEA AND VOMITING THAT IS NOT CONTROLLED WITH YOUR NAUSEA MEDICATION *UNUSUAL SHORTNESS OF BREATH *UNUSUAL BRUISING OR BLEEDING *URINARY PROBLEMS (pain or burning when urinating, or frequent urination) *BOWEL PROBLEMS (unusual diarrhea, constipation, pain near the anus) TENDERNESS IN MOUTH AND THROAT WITH OR WITHOUT PRESENCE OF ULCERS (sore throat, sores in mouth, or a toothache) UNUSUAL RASH, SWELLING OR PAIN  UNUSUAL VAGINAL DISCHARGE OR ITCHING   Items with * indicate a potential emergency and should be followed up as soon as possible or go to the Emergency Department if any problems should occur.  Please show the CHEMOTHERAPY  ALERT CARD or IMMUNOTHERAPY ALERT CARD at check-in to the Emergency Department and triage nurse.  Should you have questions after your visit or need to cancel or reschedule your appointment, please contact [facility] - A DEPT OF [facility]  Dept: [phone]  and follow the prompts.  Office hours are 8:00 a.m. to 4:30 p.m. Monday - Friday. Please note that voicemails left after 4:00 p.m. may not be returned until the following business day.  We are closed weekends and major holidays. You have access to a nurse at all times for urgent questions. Please call the main number to the clinic Dept: [phone] and follow the prompts.   For any non-urgent questions, you may also contact your provider using MyChart. We now offer e-Visits for anyone 44 and older to request care online for non-urgent symptoms. For details visit [email or web address].   Also download the MyChart app! Go to the app store, search "MyChart", open the app, select [facility], and log in with your MyChart username and password.

## 2023-12-17 NOTE — Progress Notes (Signed)
 Palliative Medicine [facility]  Telephone:(336) [phone] Fax:(336) [phone]   Name: Carol Mack Date: 12/17/2023 MRN: [id number]  DOB: [birth year]  Patient Care Team: Marius Siemens, NP as PCP - General (Internal Medicine) Sonja Montrose, MD as Consulting Physician (Hematology and Oncology) Joyce Nixon, MD as Consulting Physician (General Surgery) Elois Hair, MD as Consulting Physician (Gastroenterology) [name], Giles Labrum, NP as Nurse Practitioner (Hospice and Palliative Medicine)    INTERVAL HISTORY: Carol Mack is a 53 y.o. female with oncologic medical history including metastatic colon cancer with liver involvement.  Palliative is seeing patient for symptom management and goals of care.   SOCIAL HISTORY:     reports that she has quit smoking. Her smoking use included cigarettes. She started smoking about 29 years ago. She has a 29.4 pack-year smoking history. She has been exposed to tobacco smoke. She has never used smokeless tobacco. She reports that she does not drink alcohol and does not use drugs.  ADVANCE DIRECTIVES:  None on file   CODE STATUS: Full code  PAST MEDICAL HISTORY: Past Medical History:  Diagnosis Date   Anemia    Asthma    Bartholin cyst    Chiari malformation type I (HCC)    Hernia, inguinal    Hernia, inguinal    Hypertension    ILD (interstitial lung disease) (HCC)     ALLERGIES:  is allergic to hydrocodone .  MEDICATIONS:  Current Outpatient Medications  Medication Sig Dispense Refill   albuterol  (VENTOLIN  HFA) 108 (90 Base) MCG/ACT inhaler Inhale 1-2 puffs into the lungs every 6 (six) hours as needed for shortness of breath. 2 each 1   amLODipine  (NORVASC ) 10 MG tablet Take 1 tablet (10 mg total) by mouth daily. 30 tablet 2   dronabinol  (MARINOL ) 2.5 MG capsule Take 2 capsules (5 mg total) by mouth 3 (three) times daily before meals. 90 capsule 1   ferrous sulfate  325 (65 FE) MG tablet Take 1  tablet (325 mg total) by mouth daily with breakfast. 90 tablet 0   lidocaine -prilocaine  (EMLA ) cream Apply to affected area once (Patient taking differently: Apply 1 Application topically as needed (for port access).) 30 g 3   LORazepam  (ATIVAN ) 0.5 MG tablet TAKE 1 TABLET BY MOUTH EVERY 6 HOURS AS NEEDED FOR ANXIETY AND FOR SLEEP 45 tablet 0   mirtazapine  (REMERON ) 7.5 MG tablet Take 1 tablet (7.5 mg total) by mouth at bedtime. 30 tablet 0   ondansetron  (ZOFRAN ) 8 MG tablet Take 1 tablet (8 mg total) by mouth every 8 (eight) hours as needed for nausea or vomiting. Start on the third day after chemotherapy. 30 tablet 1   oxyCODONE  (OXY IR/ROXICODONE ) 5 MG immediate release tablet Take 1 tablet (5 mg total) by mouth every 4 (four) hours as needed for severe pain (pain score 7-10). 60 tablet 0   pantoprazole  (PROTONIX ) 40 MG tablet Take 1 tablet (40 mg total) by mouth daily. 30 tablet 0   polyethylene glycol (MIRALAX ) 17 g packet Take 17 g by mouth 2 (two) times daily. 30 each 0   prochlorperazine  (COMPAZINE ) 10 MG tablet Take 1 tablet (10 mg total) by mouth every 6 (six) hours as needed for nausea or vomiting. 30 tablet 1   senna-docusate (SENNA S) 8.6-50 MG tablet Take 1 tablet by mouth daily. 60 tablet 3   No current facility-administered medications for this visit.   Facility-Administered Medications Ordered in Other Visits  Medication Dose Route Frequency Provider Last Rate Last  Admin   0.9 %  sodium chloride  infusion   Intravenous Continuous Sonja Stonybrook, MD 10 mL/hr at 12/17/23 1231 New Bag at 12/17/23 1231   dextrose  5 % solution   Intravenous Continuous Sonja Los Alvarez, MD       fluorouracil  (ADRUCIL ) 3,500 mg in sodium chloride  0.9 % 80 mL chemo infusion  2,400 mg/m2 (Treatment Plan Recorded) Intravenous 1 day or 1 dose Sonja Dana, MD       fluorouracil  (ADRUCIL ) chemo injection 600 mg  400 mg/m2 (Treatment Plan Recorded) Intravenous Once Sonja [place], MD       sodium chloride  flush (NS) 0.9 %  injection 10 mL  10 mL Intracatheter PRN Sonja Lepanto, MD        VITAL SIGNS: LMP  (LMP Unknown)  There were no vitals filed for this visit.  Estimated body mass index is 20.43 kg/m as calculated from the following:   Height as of 12/03/23: 5\' 4"  (1.626 m).   Weight as of an earlier encounter on 12/17/23: 119 lb (54 kg).   PERFORMANCE STATUS (ECOG) : 1 - Symptomatic but completely ambulatory  Physical Exam General: NAD Cardiovascular: regular rate and rhythm Pulmonary: normal breathing pattern Extremities: no edema, no joint deformities Skin: no rashes Neurological: AAO x3  IMPRESSION: Discussed the use of AI scribe software for clinical note transcription with the patient, who gave verbal consent to proceed.  History of Present Illness Carol Mack is a 53 year old female who was seen during infusion. No acute distress. States she is doing well overall. Denies concerns of nausea, vomiting, constipation, or diarrhea. Occasional fatigue. Trying to remain active. She is requesting to have follow-up with Child psychotherapist as she has questioned about disability.   Appetite fluctuates. Some  days are better than others. Weight is up to 119lbs from 116lbs.   Ms. Thomaston reports her pain is well controlled. Denies pain during visit. She is taking oxycodone  as needed. Does not require around the clock. No adjustments at this time. Anxiety is managed with as needed ativan . Patient is taking medications appropriately. Well overdue for refills.   All questions answered and support provided.  I discussed the importance of continued conversation with family and their medical providers regarding overall plan of care and treatment options, ensuring decisions are within the context of the patients values and GOCs. Assessment & Plan Cancer related pain management Pain is well-managed with oxycodone .  No adjustments to current regimen. - Continue oxycodone  5 mg every four to six hours as  needed.  Anxiety Anxiety significantly improved with lorazepam . - Continue lorazepam  two to three times daily.  Appetite improvement Appetite improving with weight increase. - Prescribe Marinol  2.5 mg for appetite improvement, three times daily with meals. Currently on nationwide back-order  Follow-up Advised to return for follow-up care as needed. - Schedule follow-up in 4-6 weeks.   Patient expressed understanding and was in agreement with this plan. She also understands that She can call the clinic at any time with any questions, concerns, or complaints.   Any controlled substances utilized were prescribed in the context of palliative care. PDMP has been reviewed.   Visit consisted of counseling and education dealing with the complex and emotionally intense issues of symptom management and palliative care in the setting of serious and potentially life-threatening illness.  Dellia Ferguson, AGPCNP-BC  Palliative Medicine Team/[facility]

## 2023-12-17 NOTE — Progress Notes (Signed)
 [facility]   [phone] Fax:([phone]   Clinic Follow up Note   Patient Care Team: Marius Siemens, NP as PCP - General (Internal Medicine) Sonja Storden, MD as Consulting Physician (Hematology and Oncology) Joyce Nixon, MD as Consulting Physician (General Surgery) Elois Hair, MD as Consulting Physician (Gastroenterology) [name], Giles Labrum, NP as Nurse Practitioner [facility] and Palliative Medicine)  Date of Service:  12/17/2023  CHIEF COMPLAINT: f/u of colon cancer  CURRENT THERAPY:  Adjuvant chemotherapy FOLFOX  Oncology History   Colon cancer metastasized to mesenteric lymph nodes (HCC) -[id number] with mesentery node and possible liver metastasis, G2, MMR proficient, KRAS G12D mutation (+) -Patient initially presented with abdominal pain to the emergency room, underwent a colonoscopy in October 2024 which showed a large mass in the splenic lecture of left side colon. -She underwent left hemicolectomy on June 26, 2023.  Large mesenteric mass was also biopsied which confirmed metastatic adenocarcinoma. -Staging CT scan and a liver MRI showed a suspicious 0.8 cm mass in the left lobe of liver, concerning for metastasis.  No other evidence of distant metastatic disease. Liver biopsy on 08/27/2023 was benign.  -I discussed her metastatic disease and the probable incurable nature of her disease. -I recommend systemic treatment with chemotherapy FOLFOX, she started on 08/06/23. -NGS [facility] showed KRAS G12D and PIK3CA mutations, not a candidate for targeted therapy  -Her tumor was tested of for MMR which were normal, (+) KRAS mutation, not a candidate for targeted or immunotherapy. Beva added from cycle 2  -CT 10/20/2023 was negative for residual disease   Assessment & Plan Metastatic colon cancer Currently undergoing chemotherapy, with cycle ten completed today. Two more cycles are planned, with the last cycle scheduled for July  1st. Recent scan in April showed no progression of disease. She expresses concern about cancer status and potential spread, but reassured that there is no evidence of progression. Discussion about the possibility of undetectable cancer cells and the plan to repeat the scan in July. - Continue chemotherapy with D5FU and Leucovorin , omitting oxaliplatin  due to neuropathy. - Schedule repeat scan after completion of twelve cycles of chemotherapy. - Discuss potential removal of port if she desires, with understanding that it may need to be reinserted if future chemotherapy is required.  Chemotherapy-induced peripheral neuropathy Experiencing significant peripheral neuropathy, with tingling in hands and feet, and difficulty picking up objects. Neuropathy is attributed to oxaliplatin , which has been discontinued. Symptoms are expected to improve over time, but may take several months or longer. She is advised to be patient and engage in exercise to aid recovery. - Discontinue oxaliplatin  due to neuropathy. - Encourage exercise to aid in recovery from neuropathy.  Anxiety Experiencing heightened anxiety, particularly related to treatment and being late for appointments. Anxiety is managed with medication, which is typically provided during chemotherapy sessions. She requests immediate administration of anxiety medication during today's session. - Ensure availability of anxiety medication during chemotherapy sessions. - Coordinate with Landa Pine to provide anxiety medication as needed during treatment.   Plan - Lab reviewed, adequate for treatment, will proceed to cycle 10 chemotherapy today.  Due to worsening neuropathy, I will stop oxaliplatin  - Return in 2 weeks for cycle 11  SUMMARY OF ONCOLOGIC HISTORY: Oncology History  Colon cancer metastasized to mesenteric lymph nodes (HCC)  06/26/2023 Cancer Staging   Staging form: Colon and Rectum, AJCC 8th Edition - Pathologic stage from 06/26/2023: Stage IVC  (pT4a, pN1b, pM1c) - Signed by Sonja Minden, MD on  07/25/2023 Residual tumor (R): R1 - Microscopic   07/25/2023 Initial Diagnosis   Colon cancer metastasized to mesenteric lymph nodes (HCC)   08/06/2023 -  Chemotherapy   Patient is on Treatment Plan : COLORECTAL FOLFOX + Bevacizumab  q14d        Discussed the use of AI scribe software for clinical note transcription with the patient, who gave verbal consent to proceed.  History of Present Illness Carol Mack is a 53 year old female with metastatic colon cancer who presents for follow-up.  She is undergoing her tenth cycle of chemotherapy and experiences significant neuropathy, with severe tingling in her hands and feet, impacting her ability to hold objects. Her hands have turned black, which she attributes to the chemotherapy. She is frustrated with these side effects, particularly the neuropathy.  She has heightened anxiety and elevated blood pressure, especially due to being late for her appointment. She takes medication for anxiety during her chemotherapy sessions and inquires about receiving it during this visit.  She is concerned about the status of her cancer, questioning whether it has spread. She recalls a scan from April that reportedly looked good, but she remains anxious about the presence of cancer cells that might not be detectable on scans.  She dislikes the port used for chemotherapy and wants it removed due to discomfort.     All other systems were reviewed with the patient and are negative.  MEDICAL HISTORY:  Past Medical History:  Diagnosis Date   Anemia    Asthma    Bartholin cyst    Chiari malformation type I (HCC)    Hernia, inguinal    Hernia, inguinal    Hypertension    ILD (interstitial lung disease) (HCC)     SURGICAL HISTORY: Past Surgical History:  Procedure Laterality Date   fibroid freezing     unsure   IR GENERIC HISTORICAL  07/27/2014   IR RADIOLOGIST EVAL & MGMT 07/27/2014 Elene Griffes, MD [facility]  INTERV RAD   IR PORT REPAIR CENTRAL VENOUS ACCESS DEVICE  10/06/2023   PORTACATH PLACEMENT N/A 07/31/2023   Procedure: PLACEMENT OF PORT A CATHETER UNDER ULTRASOUND AND FLUOROSCOPIC GUIDANCE;  Surgeon: Candyce Champagne, MD;  Location: [facility];  Service: General;  Laterality: N/A;  GEN WITH ERAS 1.5 HOURS TOTAL   ROBOTIC ASSISTED TOTAL HYSTERECTOMY N/A 09/15/2018   Procedure: XI ROBOTIC ASSISTED TOTAL HYSTERECTOMY WITH SALPINGECTOMY;  Surgeon: Lenord Radon, MD;  Location: [facility];  Service: Gynecology;  Laterality: N/A;    I have reviewed the social history and family history with the patient and they are unchanged from previous note.  ALLERGIES:  is allergic to hydrocodone .  MEDICATIONS:  Current Outpatient Medications  Medication Sig Dispense Refill   albuterol  (VENTOLIN  HFA) 108 (90 Base) MCG/ACT inhaler Inhale 1-2 puffs into the lungs every 6 (six) hours as needed for shortness of breath. 2 each 1   amLODipine  (NORVASC ) 10 MG tablet Take 1 tablet (10 mg total) by mouth daily. 30 tablet 2   dronabinol  (MARINOL ) 2.5 MG capsule Take 2 capsules (5 mg total) by mouth 3 (three) times daily before meals. 90 capsule 1   ferrous sulfate  325 (65 FE) MG tablet Take 1 tablet (325 mg total) by mouth daily with breakfast. 90 tablet 0   lidocaine -prilocaine  (EMLA ) cream Apply to affected area once (Patient taking differently: Apply 1 Application topically as needed (for port access).) 30 g 3   LORazepam  (ATIVAN ) 0.5 MG tablet TAKE 1 TABLET BY MOUTH EVERY 6  HOURS AS NEEDED FOR ANXIETY AND FOR SLEEP 45 tablet 0   mirtazapine  (REMERON ) 7.5 MG tablet Take 1 tablet (7.5 mg total) by mouth at bedtime. 30 tablet 0   ondansetron  (ZOFRAN ) 8 MG tablet Take 1 tablet (8 mg total) by mouth every 8 (eight) hours as needed for nausea or vomiting. Start on the third day after chemotherapy. 30 tablet 1   oxyCODONE  (OXY IR/ROXICODONE ) 5 MG immediate release tablet Take 1 tablet (5 mg total) by mouth  every 4 (four) hours as needed for severe pain (pain score 7-10). 60 tablet 0   pantoprazole  (PROTONIX ) 40 MG tablet Take 1 tablet (40 mg total) by mouth daily. 30 tablet 0   polyethylene glycol (MIRALAX ) 17 g packet Take 17 g by mouth 2 (two) times daily. 30 each 0   prochlorperazine  (COMPAZINE ) 10 MG tablet Take 1 tablet (10 mg total) by mouth every 6 (six) hours as needed for nausea or vomiting. 30 tablet 1   senna-docusate (SENNA S) 8.6-50 MG tablet Take 1 tablet by mouth daily. 60 tablet 3   No current facility-administered medications for this visit.   Facility-Administered Medications Ordered in Other Visits  Medication Dose Route Frequency Provider Last Rate Last Admin   0.9 %  sodium chloride  infusion   Intravenous Continuous Sonja [place], MD       bevacizumab -adcd (VEGZELMA ) 250 mg in sodium chloride  0.9 % 100 mL chemo infusion  5 mg/kg (Treatment Plan Recorded) Intravenous Once Sonja [place], MD       dexamethasone  (DECADRON ) injection 10 mg  10 mg Intravenous Once Sonja [place], MD       dextrose  5 % solution   Intravenous Continuous Sonja [place], MD       fluorouracil  (ADRUCIL ) 3,500 mg in sodium chloride  0.9 % 80 mL chemo infusion  2,400 mg/m2 (Treatment Plan Recorded) Intravenous 1 day or 1 dose Sonja [place], MD       fluorouracil  (ADRUCIL ) chemo injection 600 mg  400 mg/m2 (Treatment Plan Recorded) Intravenous Once Sonja [place], MD       leucovorin  600 mg in dextrose  5 % 250 mL infusion  400 mg/m2 (Treatment Plan Recorded) Intravenous Once Sonja [place], MD       palonosetron  (ALOXI ) injection 0.25 mg  0.25 mg Intravenous Once Sonja [place], MD       sodium chloride  flush (NS) 0.9 % injection 10 mL  10 mL Intracatheter PRN Sonja [place], MD        PHYSICAL EXAMINATION: ECOG PERFORMANCE STATUS: 1 - Symptomatic but completely ambulatory  There were no vitals filed for this visit. Wt Readings from Last 3 Encounters:  12/17/23 119 lb (54 kg)  12/03/23 116 lb 9.6 oz (52.9 kg)  11/19/23 118 lb 11.2 oz (53.8  kg)     GENERAL:alert, no distress and comfortable SKIN: skin color, texture, turgor are normal, no rashes or significant lesions except skin hyperpigmentation in hands EYES: normal, Conjunctiva are pink and non-injected, sclera clear NECK: supple, thyroid  normal size, non-tender, without nodularity LYMPH:  no palpable lymphadenopathy in the cervical, axillary  LUNGS: clear to auscultation and percussion with normal breathing effort HEART: regular rate & rhythm and no murmurs and no lower extremity edema ABDOMEN:abdomen soft, non-tender and normal bowel sounds Musculoskeletal:no cyanosis of digits and no clubbing  NEURO: alert & oriented x 3 with fluent speech, no focal motor/sensory deficits  Physical Exam   LABORATORY DATA:  I have reviewed the data as listed    Latest Ref Rng &  Units 12/17/2023   11:23 AM 12/03/2023   10:30 AM 11/19/2023    9:24 AM  CBC  WBC 4.0 - 10.5 K/uL 6.6  5.9  7.5   Hemoglobin 12.0 - 15.0 g/dL 16.1  09.6  04.5   Hematocrit 36.0 - 46.0 % 35.6  35.1  33.1   Platelets 150 - 400 K/uL 209  192  187         Latest Ref Rng & Units 12/17/2023   11:23 AM 12/03/2023   10:30 AM 11/19/2023    9:24 AM  CMP  Glucose 70 - 99 mg/dL 409  92  811   BUN 6 - 20 mg/dL 11  12  11    Creatinine 0.44 - 1.00 mg/dL 9.14  7.82  9.56   Sodium 135 - 145 mmol/L 140  139  138   Potassium 3.5 - 5.1 mmol/L 3.7  3.8  3.5   Chloride 98 - 111 mmol/L 107  107  103   CO2 22 - 32 mmol/L 27  28  25    Calcium  8.9 - 10.3 mg/dL 9.4  9.3  9.1   Total Protein 6.5 - 8.1 g/dL 7.4  7.1  7.0   Total Bilirubin 0.0 - 1.2 mg/dL 0.4  0.3  0.3   Alkaline Phos 38 - 126 U/L 58  65  58   AST 15 - 41 U/L 17  15  17    ALT 0 - 44 U/L 9  8  8        RADIOGRAPHIC STUDIES: I have personally reviewed the radiological images as listed and agreed with the findings in the report. No results found.    No orders of the defined types were placed in this encounter.  All questions were answered. The patient  knows to call the clinic with any problems, questions or concerns. No barriers to learning was detected. The total time spent in the appointment was 25 minutes, including review of chart and various tests results, discussions about plan of care and coordination of care plan     Sonja [place], MD 12/17/2023

## 2023-12-19 ENCOUNTER — Inpatient Hospital Stay

## 2023-12-19 VITALS — BP 129/110 | HR 82 | Temp 98.6°F | Resp 18

## 2023-12-19 DIAGNOSIS — Z5112 Encounter for antineoplastic immunotherapy: Secondary | ICD-10-CM | POA: Diagnosis not present

## 2023-12-19 DIAGNOSIS — Z95828 Presence of other vascular implants and grafts: Secondary | ICD-10-CM

## 2023-12-19 MED ORDER — SODIUM CHLORIDE 0.9% FLUSH
10.0000 mL | Freq: Once | INTRAVENOUS | Status: AC
  Administered 2023-12-19: 10 mL

## 2023-12-19 MED ORDER — HEPARIN SOD (PORK) LOCK FLUSH 100 UNIT/ML IV SOLN
500.0000 [IU] | Freq: Once | INTRAVENOUS | Status: AC
  Administered 2023-12-19: 500 [IU]

## 2023-12-30 ENCOUNTER — Telehealth (INDEPENDENT_AMBULATORY_CARE_PROVIDER_SITE_OTHER): Payer: Self-pay | Admitting: Primary Care

## 2023-12-30 ENCOUNTER — Telehealth: Payer: Self-pay

## 2023-12-30 NOTE — Telephone Encounter (Signed)
 Called pt to confirm appt. Pt did not answer and LVM

## 2023-12-30 NOTE — Telephone Encounter (Signed)
 Pt called wanting to know her appts for tomorrow.  Provided pt with her appts.  Pt stated she would call her PC to cancel her Annual Physical Appts scheduled tomorrow 12/31/2023 @10am  so she can attend her appts here at the [facility] starting at 11am.

## 2023-12-30 NOTE — Progress Notes (Signed)
 [facility]   Telephone:(336) [phone] Fax:(336) [phone]    Patient Care Team: Marius Siemens, NP as PCP - General (Internal Medicine) Sonja [place], MD as Consulting Physician (Hematology and Oncology) Joyce Nixon, MD as Consulting Physician (General Surgery) Elois Hair, MD as Consulting Physician (Gastroenterology) [name], Giles Labrum, NP as Nurse Practitioner (Hospice and Palliative Medicine)   CHIEF COMPLAINT: Follow-up colon cancer  Oncology History  Colon cancer metastasized to mesenteric lymph nodes (HCC)  06/26/2023 Cancer Staging   Staging form: Colon and Rectum, AJCC 8th Edition - Pathologic stage from 06/26/2023: Stage IVC (pT4a, pN1b, pM1c) - Signed by Sonja [place], MD on 07/25/2023 Residual tumor (R): R1 - Microscopic   07/25/2023 Initial Diagnosis   Colon cancer metastasized to mesenteric lymph nodes (HCC)   08/06/2023 -  Chemotherapy   Patient is on Treatment Plan : COLORECTAL FOLFOX + Bevacizumab  q14d        CURRENT THERAPY: Adjuvant FOLFOX starting 08/06/2023, bevacizumab  added with cycle 2  INTERVAL HISTORY Carol Mack returns for follow-up and treatment as scheduled, last seen 12/17/2023 with cycle 10 FOLFOX/Belvo, oxaliplatin  was held for neuropathy which is persistent.  Darkening of the palms and neuropathy are her main concerns, otherwise tolerates treatment well.  Has some fatigue. Would like to get back to work eventually.  She is eating and drinking, bowels moving, denies pain or nausea/vomiting.  Has questions about surveillance, keeping port in, and dental surgery.  ROS  All other systems reviewed and negative  Past Medical History:  Diagnosis Date   Anemia    Asthma    Bartholin cyst    Chiari malformation type I (HCC)    Hernia, inguinal    Hernia, inguinal    Hypertension    ILD (interstitial lung disease) (HCC)      Past Surgical History:  Procedure Laterality Date   fibroid freezing     unsure   IR  GENERIC HISTORICAL  07/27/2014   IR RADIOLOGIST EVAL & MGMT 07/27/2014 Elene Griffes, MD [facility] INTERV RAD   IR PORT REPAIR CENTRAL VENOUS ACCESS DEVICE  10/06/2023   PORTACATH PLACEMENT N/A 07/31/2023   Procedure: PLACEMENT OF PORT A CATHETER UNDER ULTRASOUND AND FLUOROSCOPIC GUIDANCE;  Surgeon: Candyce Champagne, MD;  Location: [facility];  Service: General;  Laterality: N/A;  GEN WITH ERAS 1.5 HOURS TOTAL   ROBOTIC ASSISTED TOTAL HYSTERECTOMY N/A 09/15/2018   Procedure: XI ROBOTIC ASSISTED TOTAL HYSTERECTOMY WITH SALPINGECTOMY;  Surgeon: Lenord Radon, MD;  Location: [facility];  Service: Gynecology;  Laterality: N/A;     Outpatient Encounter Medications as of 12/31/2023  Medication Sig   b complex vitamins capsule Take 1 capsule by mouth daily.   albuterol  (VENTOLIN  HFA) 108 (90 Base) MCG/ACT inhaler Inhale 1-2 puffs into the lungs every 6 (six) hours as needed for shortness of breath.   amLODipine  (NORVASC ) 10 MG tablet Take 1 tablet (10 mg total) by mouth daily.   dronabinol  (MARINOL ) 2.5 MG capsule Take 2 capsules (5 mg total) by mouth 3 (three) times daily before meals.   ferrous sulfate  325 (65 FE) MG tablet Take 1 tablet (325 mg total) by mouth daily with breakfast.   lidocaine -prilocaine  (EMLA ) cream Apply to affected area once (Patient taking differently: Apply 1 Application topically as needed (for port access).)   LORazepam  (ATIVAN ) 0.5 MG tablet Take 1 tablet (0.5 mg total) by mouth every 8 (eight) hours as needed for anxiety.   mirtazapine  (REMERON ) 7.5 MG tablet  Take 1 tablet (7.5 mg total) by mouth at bedtime.   ondansetron  (ZOFRAN ) 8 MG tablet Take 1 tablet (8 mg total) by mouth every 8 (eight) hours as needed for nausea or vomiting. Start on the third day after chemotherapy.   oxyCODONE  (OXY IR/ROXICODONE ) 5 MG immediate release tablet Take 1 tablet (5 mg total) by mouth every 4 (four) hours as needed for severe pain (pain score 7-10).   pantoprazole  (PROTONIX ) 40 MG  tablet Take 1 tablet (40 mg total) by mouth daily.   polyethylene glycol (MIRALAX ) 17 g packet Take 17 g by mouth 2 (two) times daily.   prochlorperazine  (COMPAZINE ) 10 MG tablet Take 1 tablet (10 mg total) by mouth every 6 (six) hours as needed for nausea or vomiting.   senna-docusate (SENNA S) 8.6-50 MG tablet Take 1 tablet by mouth daily.   No facility-administered encounter medications on file as of 12/31/2023.     Today's Vitals   12/31/23 1135 12/31/23 1137  BP: 128/70   Pulse: 97   Resp: 20   Temp: 98 F (36.7 C)   TempSrc: Temporal   SpO2: 98%   Weight: 121 lb 4.8 oz (55 kg)   Height: 5' 4 (1.626 m)   PainSc:  0-No pain   Body mass index is 20.82 kg/m.   ECOG PERFORMANCE STATUS: 1 - Symptomatic but completely ambulatory  PHYSICAL EXAM GENERAL:alert, no distress and comfortable SKIN: Palms with hyperpigmentation.  No rash  EYES: sclera clear LUNGS: clear with normal breathing effort HEART: regular rate & rhythm, no lower extremity edema ABDOMEN: abdomen soft, non-tender and normal bowel sounds NEURO: alert & oriented x 3 with fluent speech, no focal motor deficits PAC without erythema    CBC    Latest Ref Rng & Units 12/31/2023   11:05 AM 12/17/2023   11:23 AM 12/03/2023   10:30 AM  CBC  WBC 4.0 - 10.5 K/uL 7.2  6.6  5.9   Hemoglobin 12.0 - 15.0 g/dL 16.1  09.6  04.5   Hematocrit 36.0 - 46.0 % 35.9  35.6  35.1   Platelets 150 - 400 K/uL 214  209  192       CMP     Latest Ref Rng & Units 12/31/2023   11:05 AM 12/17/2023   11:23 AM 12/03/2023   10:30 AM  CMP  Glucose 70 - 99 mg/dL 409  811  92   BUN 6 - 20 mg/dL 12  11  12    Creatinine 0.44 - 1.00 mg/dL 9.14  7.82  9.56   Sodium 135 - 145 mmol/L 141  140  139   Potassium 3.5 - 5.1 mmol/L 3.9  3.7  3.8   Chloride 98 - 111 mmol/L 108  107  107   CO2 22 - 32 mmol/L 27  27  28    Calcium  8.9 - 10.3 mg/dL 9.5  9.4  9.3   Total Protein 6.5 - 8.1 g/dL 7.5  7.4  7.1   Total Bilirubin 0.0 - 1.2 mg/dL 0.4  0.4  0.3    Alkaline Phos 38 - 126 U/L 63  58  65   AST 15 - 41 U/L 17  17  15    ALT 0 - 44 U/L 10  9  8        ASSESSMENT & PLAN:  Colon cancer metastasized to mesenteric lymph nodes, pT4aN1bM1c with mesentery node and possible liver metastasis, G2, MMR proficient, KRAS G12D mutation (+) -Patient initially presented with abdominal pain to  the emergency room, underwent a colonoscopy in October 2024 which showed a large mass in the splenic lecture of left side colon. -She underwent left hemicolectomy on June 26, 2023.  Large mesenteric mass was also biopsied which confirmed metastatic adenocarcinoma. -Staging CT scan and a liver MRI showed a suspicious 0.8 cm mass in the left lobe of liver, concerning for metastasis.  No other evidence of distant metastatic disease. Liver biopsy on 08/27/2023 was benign.  -NGS [facility] showed KRAS G12D and PIK3CA mutations, MMR normal, (+) KRAS mutation, not a candidate for targeted or immunotherapy.  -She began adjuvant FOLFOX 08/06/23, Beva added from cycle 2  -CT 10/20/2023 was negative for residual disease  -Carol Mack appears stable, s/p C10 FOLFOX/Beva, tolerating well except CIPN. Oxaliplatin  has been stopped with cycle 10. I prescribed B complex vitamin.  -Labs reviewed, adequate to proceed with C11 FOLFOX/Beva, continue holding Oxali.  -We reviewed recovery trajectory, surveillance tools ie scans and MRD testing/signatera, pros/cons of keeping port in, and dental clearance for surgery after she has stopped Beva. Questions answered -F/up and last cycle 12 in 2 weeks as scheduled    PLAN: -Labs reviewed -Proceed with C11 FOLFOX/Beva today, hold Oxali -Rx: B complex vitamin for CIPN -Handicap form given to pt -F/up and last cycle 12 in 2 weeks -Ask SW Alvera Austria to organize bell ringing after last chemo    All questions were answered. The patient knows to call the clinic with any problems, questions or concerns. No barriers to learning were detected. I spent  20 minutes counseling the patient face to face. The total time spent in the appointment was 30 minutes and more than 50% was on counseling, review of test results, and coordination of care.   Hasani Diemer K Aliyanna Wassmer, NP 12/31/2023

## 2023-12-31 ENCOUNTER — Inpatient Hospital Stay

## 2023-12-31 ENCOUNTER — Inpatient Hospital Stay (HOSPITAL_BASED_OUTPATIENT_CLINIC_OR_DEPARTMENT_OTHER): Admitting: Nurse Practitioner

## 2023-12-31 ENCOUNTER — Other Ambulatory Visit: Payer: Self-pay

## 2023-12-31 ENCOUNTER — Encounter: Payer: Self-pay | Admitting: Hematology

## 2023-12-31 ENCOUNTER — Encounter: Payer: Self-pay | Admitting: Nurse Practitioner

## 2023-12-31 ENCOUNTER — Encounter (INDEPENDENT_AMBULATORY_CARE_PROVIDER_SITE_OTHER): Admitting: Primary Care

## 2023-12-31 VITALS — BP 128/70 | HR 97 | Temp 98.0°F | Resp 20 | Ht 64.0 in | Wt 121.3 lb

## 2023-12-31 DIAGNOSIS — C772 Secondary and unspecified malignant neoplasm of intra-abdominal lymph nodes: Secondary | ICD-10-CM

## 2023-12-31 DIAGNOSIS — C189 Malignant neoplasm of colon, unspecified: Secondary | ICD-10-CM

## 2023-12-31 DIAGNOSIS — Z95828 Presence of other vascular implants and grafts: Secondary | ICD-10-CM

## 2023-12-31 DIAGNOSIS — Z5112 Encounter for antineoplastic immunotherapy: Secondary | ICD-10-CM | POA: Diagnosis not present

## 2023-12-31 LAB — CMP (CANCER CENTER ONLY)
ALT: 10 U/L (ref 0–44)
AST: 17 U/L (ref 15–41)
Albumin: 4 g/dL (ref 3.5–5.0)
Alkaline Phosphatase: 63 U/L (ref 38–126)
Anion gap: 6 (ref 5–15)
BUN: 12 mg/dL (ref 6–20)
CO2: 27 mmol/L (ref 22–32)
Calcium: 9.5 mg/dL (ref 8.9–10.3)
Chloride: 108 mmol/L (ref 98–111)
Creatinine: 0.68 mg/dL (ref 0.44–1.00)
GFR, Estimated: >60 mL/min (ref >60)
Glucose, Bld: 114 mg/dL — ABNORMAL HIGH (ref 70–99)
Potassium: 3.9 mmol/L (ref 3.5–5.1)
Sodium: 141 mmol/L (ref 135–145)
Total Bilirubin: 0.4 mg/dL (ref 0.0–1.2)
Total Protein: 7.5 g/dL (ref 6.5–8.1)

## 2023-12-31 LAB — CBC WITH DIFFERENTIAL (CANCER CENTER ONLY)
Abs Immature Granulocytes: 0.01 10*3/uL (ref 0.00–0.07)
Basophils Absolute: 0.1 10*3/uL (ref 0.0–0.1)
Basophils Relative: 1 %
Eosinophils Absolute: 0.1 10*3/uL (ref 0.0–0.5)
Eosinophils Relative: 2 %
HCT: 35.9 % — ABNORMAL LOW (ref 36.0–46.0)
Hemoglobin: 12.6 g/dL (ref 12.0–15.0)
Immature Granulocytes: 0 %
Lymphocytes Relative: 41 %
Lymphs Abs: 3 10*3/uL (ref 0.7–4.0)
MCH: 24.1 pg — ABNORMAL LOW (ref 26.0–34.0)
MCHC: 35.1 g/dL (ref 30.0–36.0)
MCV: 68.8 fL — ABNORMAL LOW (ref 80.0–100.0)
Monocytes Absolute: 1.1 10*3/uL — ABNORMAL HIGH (ref 0.1–1.0)
Monocytes Relative: 16 %
Neutro Abs: 2.8 10*3/uL (ref 1.7–7.7)
Neutrophils Relative %: 40 %
Platelet Count: 214 10*3/uL (ref 150–400)
RBC: 5.22 MIL/uL — ABNORMAL HIGH (ref 3.87–5.11)
RDW: 20.2 % — ABNORMAL HIGH (ref 11.5–15.5)
WBC Count: 7.2 10*3/uL (ref 4.0–10.5)
nRBC: 0 % (ref 0.0–0.2)

## 2023-12-31 LAB — TOTAL PROTEIN, URINE DIPSTICK: Protein, ur: NEGATIVE mg/dL

## 2023-12-31 LAB — CEA (ACCESS): CEA (CHCC): 9.56 ng/mL — ABNORMAL HIGH (ref 0.00–5.00)

## 2023-12-31 MED ORDER — SODIUM CHLORIDE 0.9 % IV SOLN: INTRAVENOUS | Status: DC

## 2023-12-31 MED ORDER — LEUCOVORIN CALCIUM INJECTION 350 MG
400.0000 mg/m2 | Freq: Once | INTRAVENOUS | Status: DC
  Filled 2023-12-31: qty 30

## 2023-12-31 MED ORDER — B COMPLEX VITAMINS PO CAPS: 1.0000 | ORAL_CAPSULE | Freq: Every day | ORAL | 0 refills | Status: DC

## 2023-12-31 MED ORDER — PALONOSETRON HCL INJECTION 0.25 MG/5ML
0.2500 mg | Freq: Once | INTRAVENOUS | Status: AC
  Administered 2023-12-31: 0.25 mg via INTRAVENOUS
  Filled 2023-12-31: qty 5

## 2023-12-31 MED ORDER — SODIUM CHLORIDE 0.9 % IV SOLN
2400.0000 mg/m2 | INTRAVENOUS | Status: DC
  Administered 2023-12-31: 3500 mg via INTRAVENOUS
  Filled 2023-12-31: qty 70

## 2023-12-31 MED ORDER — LEUCOVORIN CALCIUM INJECTION 350 MG
400.0000 mg/m2 | Freq: Once | INTRAVENOUS | Status: AC
  Administered 2023-12-31: 600 mg via INTRAVENOUS
  Filled 2023-12-31: qty 30

## 2023-12-31 MED ORDER — DEXTROSE 5 % IV SOLN: INTRAVENOUS | Status: DC

## 2023-12-31 MED ORDER — FLUOROURACIL CHEMO INJECTION 2.5 GM/50ML
400.0000 mg/m2 | Freq: Once | INTRAVENOUS | Status: AC
  Administered 2023-12-31: 600 mg via INTRAVENOUS
  Filled 2023-12-31: qty 12

## 2023-12-31 MED ORDER — SODIUM CHLORIDE 0.9% FLUSH
10.0000 mL | Freq: Once | INTRAVENOUS | Status: AC
  Administered 2023-12-31: 10 mL

## 2023-12-31 MED ORDER — LORAZEPAM 1 MG PO TABS
0.5000 mg | ORAL_TABLET | Freq: Once | ORAL | Status: AC
  Administered 2023-12-31: 0.5 mg via ORAL
  Filled 2023-12-31: qty 1

## 2023-12-31 MED ORDER — SODIUM CHLORIDE 0.9 % IV SOLN
5.0000 mg/kg | Freq: Once | INTRAVENOUS | Status: AC
  Administered 2023-12-31: 250 mg via INTRAVENOUS
  Filled 2023-12-31: qty 10

## 2023-12-31 MED ORDER — DEXAMETHASONE SODIUM PHOSPHATE 10 MG/ML IJ SOLN
10.0000 mg | Freq: Once | INTRAMUSCULAR | Status: AC
  Administered 2023-12-31: 10 mg via INTRAVENOUS
  Filled 2023-12-31: qty 1

## 2023-12-31 NOTE — Patient Instructions (Signed)
 [facility] - A DEPT OF [facility]  Discharge Instructions: Thank you for choosing [facility] to provide your oncology and hematology care.   If you have a lab appointment with the Cancer Center, please go directly to the Cancer Center and check in at the registration area.   Wear comfortable clothing and clothing appropriate for easy access to any Portacath or PICC line.   We strive to give you quality time with your provider. You may need to reschedule your appointment if you arrive late (15 or more minutes).  Arriving late affects you and other patients whose appointments are after yours.  Also, if you miss three or more appointments without notifying the office, you may be dismissed from the clinic at the provider's discretion.      For prescription refill requests, have your pharmacy contact our office and allow 72 hours for refills to be completed.    Today you received the following chemotherapy and/or immunotherapy agents bevacizumab , leucovorin , fluorourcil      To help prevent nausea and vomiting after your treatment, we encourage you to take your nausea medication as directed.  BELOW ARE SYMPTOMS THAT SHOULD BE REPORTED IMMEDIATELY: *FEVER GREATER THAN 100.4 F (38 C) OR HIGHER *CHILLS OR SWEATING *NAUSEA AND VOMITING THAT IS NOT CONTROLLED WITH YOUR NAUSEA MEDICATION *UNUSUAL SHORTNESS OF BREATH *UNUSUAL BRUISING OR BLEEDING *URINARY PROBLEMS (pain or burning when urinating, or frequent urination) *BOWEL PROBLEMS (unusual diarrhea, constipation, pain near the anus) TENDERNESS IN MOUTH AND THROAT WITH OR WITHOUT PRESENCE OF ULCERS (sore throat, sores in mouth, or a toothache) UNUSUAL RASH, SWELLING OR PAIN  UNUSUAL VAGINAL DISCHARGE OR ITCHING   Items with * indicate a potential emergency and should be followed up as soon as possible or go to the Emergency Department if any problems should occur.  Please show the CHEMOTHERAPY  ALERT CARD or IMMUNOTHERAPY ALERT CARD at check-in to the Emergency Department and triage nurse.  Should you have questions after your visit or need to cancel or reschedule your appointment, please contact [facility] - A DEPT OF [facility]  Dept: [phone]  and follow the prompts.  Office hours are 8:00 a.m. to 4:30 p.m. Monday - Friday. Please note that voicemails left after 4:00 p.m. may not be returned until the following business day.  We are closed weekends and major holidays. You have access to a nurse at all times for urgent questions. Please call the main number to the clinic Dept: [phone] and follow the prompts.   For any non-urgent questions, you may also contact your provider using MyChart. We now offer e-Visits for anyone 40 and older to request care online for non-urgent symptoms. For details visit [email or web address].   Also download the MyChart app! Go to the app store, search MyChart, open the app, select [place], and log in with your MyChart username and password.

## 2024-01-02 ENCOUNTER — Telehealth (INDEPENDENT_AMBULATORY_CARE_PROVIDER_SITE_OTHER): Payer: Self-pay | Admitting: Primary Care

## 2024-01-02 ENCOUNTER — Inpatient Hospital Stay

## 2024-01-02 VITALS — BP 124/99 | HR 79 | Temp 98.4°F | Resp 20

## 2024-01-02 DIAGNOSIS — Z5112 Encounter for antineoplastic immunotherapy: Secondary | ICD-10-CM | POA: Diagnosis not present

## 2024-01-02 DIAGNOSIS — Z95828 Presence of other vascular implants and grafts: Secondary | ICD-10-CM

## 2024-01-02 MED ORDER — SODIUM CHLORIDE 0.9% FLUSH
10.0000 mL | Freq: Once | INTRAVENOUS | Status: AC
  Administered 2024-01-02: 10 mL

## 2024-01-02 MED ORDER — HEPARIN SOD (PORK) LOCK FLUSH 100 UNIT/ML IV SOLN
500.0000 [IU] | Freq: Once | INTRAVENOUS | Status: AC
  Administered 2024-01-02: 500 [IU]

## 2024-01-02 NOTE — Progress Notes (Signed)
 Pt arrived 1.5 hrs early for pump d/c apt. Per home infusion pump, 5FU infusion not completed. Pt educated on arriving for pump d/c at apt time in order to get full infusion amount. Pt refused to come back at later time today. Pump d/c. Port locked with heparin  and de accessed. VSS. Pt ambulatory to lobby for d/c.

## 2024-01-02 NOTE — Telephone Encounter (Signed)
 Left VM with pt about their upcoming appt.

## 2024-01-05 ENCOUNTER — Encounter (INDEPENDENT_AMBULATORY_CARE_PROVIDER_SITE_OTHER): Payer: Self-pay | Admitting: Primary Care

## 2024-01-05 ENCOUNTER — Ambulatory Visit (INDEPENDENT_AMBULATORY_CARE_PROVIDER_SITE_OTHER): Admitting: Primary Care

## 2024-01-05 VITALS — BP 141/85 | HR 85 | Resp 16 | Ht 64.0 in | Wt 122.4 lb

## 2024-01-05 DIAGNOSIS — Z0000 Encounter for general adult medical examination without abnormal findings: Secondary | ICD-10-CM | POA: Diagnosis not present

## 2024-01-05 DIAGNOSIS — J40 Bronchitis, not specified as acute or chronic: Secondary | ICD-10-CM | POA: Diagnosis not present

## 2024-01-05 MED ORDER — ALBUTEROL SULFATE HFA 108 (90 BASE) MCG/ACT IN AERS: 1.0000 | INHALATION_SPRAY | Freq: Four times a day (QID) | RESPIRATORY_TRACT | 1 refills | Status: AC | PRN

## 2024-01-05 NOTE — Progress Notes (Signed)
 [facility]  Carol Mack is a 53 y.o. female presents to office today for annual physical exam examination.    Concerns today include: 1. wheezing  Occupation: none, Marital status: S, Substance use: N Diet: N, Exercise: Y  Health Maintenance  Topic Date Due   Pneumococcal Vaccine 41-26 Years old (1 of 2 - PCV) Never done   Zoster Vaccines- Shingrix (1 of 2) Never done   COVID-19 Vaccine (2 - Pfizer risk series) 11/25/2019   MAMMOGRAM  08/13/2020   INFLUENZA VACCINE  02/13/2024   Lung Cancer Screening  09/18/2024   DTaP/Tdap/Td (2 - Td or Tdap) 09/27/2029   Colonoscopy  05/14/2033   Hepatitis C Screening  Completed   HIV Screening  Completed   HPV VACCINES  Aged Out   Meningococcal B Vaccine  Aged Out     Past Medical History:  Diagnosis Date   Anemia    Asthma    Bartholin cyst    Chiari malformation type I (HCC)    Hernia, inguinal    Hernia, inguinal    Hypertension    ILD (interstitial lung disease) (HCC)    Social History   Socioeconomic History   Marital status: Single    Spouse name: Not on file   Number of children: 2   Years of education: Not on file   Highest education level: Not on file  Occupational History   Not on file  Tobacco Use   Smoking status: Former    Current packs/day: 1.00    Average packs/day: 1 pack/day for 29.5 years (29.5 ttl pk-yrs)    Types: Cigarettes    Start date: 1996    Passive exposure: Current   Smokeless tobacco: Never  Vaping Use   Vaping status: Never Used  Substance and Sexual Activity   Alcohol use: No   Drug use: No   Sexual activity: Not on file  Other Topics Concern   Not on file  Social History Narrative   Not on file   Social Drivers of Health   Financial Resource Strain: Not on file  Food Insecurity: No Food Insecurity (06/26/2023)   Hunger Vital Sign    Worried About Running Out of Food in the Last Year: Never true    Ran Out of Food in the Last Year: Never true   Transportation Needs: No Transportation Needs (06/26/2023)   PRAPARE - Administrator, Civil Service (Medical): No    Lack of Transportation (Non-Medical): No  Physical Activity: Not on file  Stress: Not on file  Social Connections: Not on file  Intimate Partner Violence: Patient Declined (06/26/2023)   Humiliation, Afraid, Rape, and Kick questionnaire    Fear of Current or Ex-Partner: Patient declined    Emotionally Abused: Patient declined    Physically Abused: Patient declined    Sexually Abused: Patient declined   Past Surgical History:  Procedure Laterality Date   fibroid freezing     unsure   IR GENERIC HISTORICAL  07/27/2014   IR RADIOLOGIST EVAL & MGMT 07/27/2014 Juliene Balder, MD [facility] INTERV RAD   IR PORT REPAIR CENTRAL VENOUS ACCESS DEVICE  10/06/2023   PORTACATH PLACEMENT N/A 07/31/2023   Procedure: PLACEMENT OF PORT A CATHETER UNDER ULTRASOUND AND FLUOROSCOPIC GUIDANCE;  Surgeon: Sheldon Standing, MD;  Location: [facility];  Service: General;  Laterality: N/A;  GEN WITH ERAS 1.5 HOURS TOTAL   ROBOTIC ASSISTED TOTAL HYSTERECTOMY N/A 09/15/2018   Procedure: XI ROBOTIC ASSISTED TOTAL HYSTERECTOMY WITH SALPINGECTOMY;  Surgeon: Corene Coy, MD;  Location: [facility];  Service: Gynecology;  Laterality: N/A;   Family History  Problem Relation Age of Onset   Thyroid  disease Mother        large benign goiter   Cancer Maternal Uncle        UNKNOWN TYPE CANCER   Colon cancer Neg Hx    Esophageal cancer Neg Hx    Rectal cancer Neg Hx    Stomach cancer Neg Hx     Current Outpatient Medications:    albuterol  (VENTOLIN  HFA) 108 (90 Base) MCG/ACT inhaler, Inhale 1-2 puffs into the lungs every 6 (six) hours as needed for shortness of breath., Disp: 2 each, Rfl: 1   amLODipine  (NORVASC ) 10 MG tablet, Take 1 tablet (10 mg total) by mouth daily., Disp: 30 tablet, Rfl: 2   b complex vitamins capsule, Take 1 capsule by mouth daily., Disp: 30 capsule, Rfl: 0    dronabinol  (MARINOL ) 2.5 MG capsule, Take 2 capsules (5 mg total) by mouth 3 (three) times daily before meals., Disp: 90 capsule, Rfl: 1   ferrous sulfate  325 (65 FE) MG tablet, Take 1 tablet (325 mg total) by mouth daily with breakfast., Disp: 90 tablet, Rfl: 0   lidocaine -prilocaine  (EMLA ) cream, Apply to affected area once (Patient taking differently: Apply 1 Application topically as needed (for port access).), Disp: 30 g, Rfl: 3   LORazepam  (ATIVAN ) 0.5 MG tablet, Take 1 tablet (0.5 mg total) by mouth every 8 (eight) hours as needed for anxiety., Disp: 45 tablet, Rfl: 0   mirtazapine  (REMERON ) 7.5 MG tablet, Take 1 tablet (7.5 mg total) by mouth at bedtime., Disp: 30 tablet, Rfl: 0   ondansetron  (ZOFRAN ) 8 MG tablet, Take 1 tablet (8 mg total) by mouth every 8 (eight) hours as needed for nausea or vomiting. Start on the third day after chemotherapy., Disp: 30 tablet, Rfl: 1   oxyCODONE  (OXY IR/ROXICODONE ) 5 MG immediate release tablet, Take 1 tablet (5 mg total) by mouth every 4 (four) hours as needed for severe pain (pain score 7-10)., Disp: 60 tablet, Rfl: 0   pantoprazole  (PROTONIX ) 40 MG tablet, Take 1 tablet (40 mg total) by mouth daily., Disp: 30 tablet, Rfl: 0   polyethylene glycol (MIRALAX ) 17 g packet, Take 17 g by mouth 2 (two) times daily., Disp: 30 each, Rfl: 0   prochlorperazine  (COMPAZINE ) 10 MG tablet, Take 1 tablet (10 mg total) by mouth every 6 (six) hours as needed for nausea or vomiting., Disp: 30 tablet, Rfl: 1   senna-docusate (SENNA S) 8.6-50 MG tablet, Take 1 tablet by mouth daily., Disp: 60 tablet, Rfl: 3 Outpatient Encounter Medications as of 01/05/2024  Medication Sig   albuterol  (VENTOLIN  HFA) 108 (90 Base) MCG/ACT inhaler Inhale 1-2 puffs into the lungs every 6 (six) hours as needed for shortness of breath.   amLODipine  (NORVASC ) 10 MG tablet Take 1 tablet (10 mg total) by mouth daily.   b complex vitamins capsule Take 1 capsule by mouth daily.   dronabinol  (MARINOL )  2.5 MG capsule Take 2 capsules (5 mg total) by mouth 3 (three) times daily before meals.   ferrous sulfate  325 (65 FE) MG tablet Take 1 tablet (325 mg total) by mouth daily with breakfast.   lidocaine -prilocaine  (EMLA ) cream Apply to affected area once (Patient taking differently: Apply 1 Application topically as needed (for port access).)   LORazepam  (ATIVAN ) 0.5 MG tablet Take 1 tablet (0.5 mg total) by mouth every 8 (eight) hours as  needed for anxiety.   mirtazapine  (REMERON ) 7.5 MG tablet Take 1 tablet (7.5 mg total) by mouth at bedtime.   ondansetron  (ZOFRAN ) 8 MG tablet Take 1 tablet (8 mg total) by mouth every 8 (eight) hours as needed for nausea or vomiting. Start on the third day after chemotherapy.   oxyCODONE  (OXY IR/ROXICODONE ) 5 MG immediate release tablet Take 1 tablet (5 mg total) by mouth every 4 (four) hours as needed for severe pain (pain score 7-10).   pantoprazole  (PROTONIX ) 40 MG tablet Take 1 tablet (40 mg total) by mouth daily.   polyethylene glycol (MIRALAX ) 17 g packet Take 17 g by mouth 2 (two) times daily.   prochlorperazine  (COMPAZINE ) 10 MG tablet Take 1 tablet (10 mg total) by mouth every 6 (six) hours as needed for nausea or vomiting.   senna-docusate (SENNA S) 8.6-50 MG tablet Take 1 tablet by mouth daily.   [DISCONTINUED] albuterol  (VENTOLIN  HFA) 108 (90 Base) MCG/ACT inhaler Inhale 1-2 puffs into the lungs every 6 (six) hours as needed for shortness of breath.   No facility-administered encounter medications on file as of 01/05/2024.    Allergies  Allergen Reactions   Hydrocodone  Other (See Comments)    Made the patient jittery     ROS: Review of Systems Pertinent items noted in HPI and remainder of comprehensive ROS otherwise negative.    Physical exam General: No apparent distress. Eyes: Extraocular eye movements intact, pupils equal and round. Neck: Supple, trachea midline. Thyroid : No enlargement, mobile without fixation, no  tenderness. Cardiovascular: Regular rhythm and rate, no murmur, normal radial pulses. Respiratory: Normal respiratory effort, clear to auscultation. Gastrointestinal: Normal pitch active bowel sounds, nontender abdomen without distention or appreciable hepatomegaly. Neurologic: Cranial nerves normal as tested, deep tendon reflexes  , no tremor,  Musculoskeletal: Normal muscle tone, no tenderness on palpation of tibia, no excessive thoracic kyphosis. Skin: Appropriate warmth, no visible rash. Mental status: Alert, conversant, speech clear, thought logical, appropriate mood and affect, no hallucinations or delusions evident. Hematologic/lymphatic: No cervical adenopathy, no visible ecchymoses.    Assessment/ Plan: Carol Mack here for annual physical exam.  Carol Mack was seen today for annual exam.  Diagnoses and all orders for this visit:  Annual physical exam  Bronchitis -     albuterol  (VENTOLIN  HFA) 108 (90 Base) MCG/ACT inhaler; Inhale 1-2 puffs into the lungs every 6 (six) hours as needed for shortness of breath.  Counseled on healthy lifestyle choices, including diet (rich in fruits, vegetables and lean meats and low in salt and simple carbohydrates) and exercise (at least 30 minutes of moderate physical activity daily).  Patient to follow up in 1 year for annual exam or sooner if needed.  The above assessment and management plan was discussed with the patient. The patient verbalized understanding of and has agreed to the management plan. Patient is aware to call the clinic if symptoms persist or worsen. Patient is aware when to return to the clinic for a follow-up visit. Patient educated on when it is appropriate to go to the emergency department.   This note has been created with Education officer, environmental. Any transcriptional errors are unintentional.   Carol SHAUNNA Bohr, NP 01/05/2024, 11:08 AM

## 2024-01-08 ENCOUNTER — Other Ambulatory Visit: Payer: Self-pay

## 2024-01-08 ENCOUNTER — Other Ambulatory Visit (HOSPITAL_COMMUNITY): Payer: Self-pay

## 2024-01-08 ENCOUNTER — Encounter: Payer: Self-pay | Admitting: Hematology

## 2024-01-08 MED ORDER — NYSTATIN 100000 UNIT/ML MT SUSP
5.0000 mL | Freq: Four times a day (QID) | OROMUCOSAL | 1 refills | Status: DC | PRN
  Filled 2024-01-08: qty 140, 7d supply, fill #0

## 2024-01-08 NOTE — Progress Notes (Signed)
 Patient called in stating her tongue was hurting and felt like it was an uncomfortable feeling. She stated she had this problem before and used magic mouthwash to treat it. I spoke with Dr. Lanny she stated it was ok to send in order for magic mouthwash to [facility] community pharmacy. Made patient aware she will go by and pick up med.

## 2024-01-09 ENCOUNTER — Other Ambulatory Visit (HOSPITAL_COMMUNITY): Payer: Self-pay

## 2024-01-12 ENCOUNTER — Encounter: Payer: Self-pay | Admitting: Hematology

## 2024-01-12 NOTE — Assessment & Plan Note (Signed)
-  [id number] with mesentery node and possible liver metastasis, G2, MMR proficient, KRAS G12D mutation (+) -Patient initially presented with abdominal pain to the emergency room, underwent a colonoscopy in October 2024 which showed a large mass in the splenic lecture of left side colon. -She underwent left hemicolectomy on June 26, 2023.  Large mesenteric mass was also biopsied which confirmed metastatic adenocarcinoma. -Staging CT scan and a liver MRI showed a suspicious 0.8 cm mass in the left lobe of liver, concerning for metastasis.  No other evidence of distant metastatic disease. Liver biopsy on 08/27/2023 was benign.  -I discussed her metastatic disease and the probable incurable nature of her disease. -I recommend systemic treatment with chemotherapy FOLFOX, she started on 08/06/23. -NGS [facility] showed KRAS G12D and PIK3CA mutations, not a candidate for targeted therapy  -Her tumor was tested of for MMR which were normal, (+) KRAS mutation, not a candidate for targeted or immunotherapy. Beva added from cycle 2  -CT 10/20/2023 was negative for residual disease

## 2024-01-13 ENCOUNTER — Inpatient Hospital Stay (HOSPITAL_BASED_OUTPATIENT_CLINIC_OR_DEPARTMENT_OTHER): Admitting: Hematology

## 2024-01-13 ENCOUNTER — Inpatient Hospital Stay

## 2024-01-13 ENCOUNTER — Inpatient Hospital Stay: Admitting: Nurse Practitioner

## 2024-01-13 ENCOUNTER — Inpatient Hospital Stay: Attending: Hematology

## 2024-01-13 ENCOUNTER — Inpatient Hospital Stay: Admitting: Licensed Clinical Social Worker

## 2024-01-13 VITALS — HR 83

## 2024-01-13 VITALS — BP 112/90 | HR 102 | Temp 98.2°F | Resp 16 | Ht 64.0 in | Wt 123.0 lb

## 2024-01-13 DIAGNOSIS — G893 Neoplasm related pain (acute) (chronic): Secondary | ICD-10-CM

## 2024-01-13 DIAGNOSIS — D649 Anemia, unspecified: Secondary | ICD-10-CM

## 2024-01-13 DIAGNOSIS — Z515 Encounter for palliative care: Secondary | ICD-10-CM | POA: Diagnosis not present

## 2024-01-13 DIAGNOSIS — Z95828 Presence of other vascular implants and grafts: Secondary | ICD-10-CM

## 2024-01-13 DIAGNOSIS — C772 Secondary and unspecified malignant neoplasm of intra-abdominal lymph nodes: Secondary | ICD-10-CM | POA: Insufficient documentation

## 2024-01-13 DIAGNOSIS — F419 Anxiety disorder, unspecified: Secondary | ICD-10-CM

## 2024-01-13 DIAGNOSIS — G4709 Other insomnia: Secondary | ICD-10-CM

## 2024-01-13 DIAGNOSIS — C189 Malignant neoplasm of colon, unspecified: Secondary | ICD-10-CM

## 2024-01-13 DIAGNOSIS — Z5112 Encounter for antineoplastic immunotherapy: Secondary | ICD-10-CM | POA: Diagnosis present

## 2024-01-13 DIAGNOSIS — Z5111 Encounter for antineoplastic chemotherapy: Secondary | ICD-10-CM | POA: Diagnosis present

## 2024-01-13 DIAGNOSIS — C186 Malignant neoplasm of descending colon: Secondary | ICD-10-CM | POA: Diagnosis present

## 2024-01-13 DIAGNOSIS — Z87891 Personal history of nicotine dependence: Secondary | ICD-10-CM | POA: Diagnosis not present

## 2024-01-13 LAB — CMP (CANCER CENTER ONLY)
ALT: 10 U/L (ref 0–44)
AST: 15 U/L (ref 15–41)
Albumin: 4 g/dL (ref 3.5–5.0)
Alkaline Phosphatase: 61 U/L (ref 38–126)
Anion gap: 8 (ref 5–15)
BUN: 12 mg/dL (ref 6–20)
CO2: 27 mmol/L (ref 22–32)
Calcium: 9.5 mg/dL (ref 8.9–10.3)
Chloride: 106 mmol/L (ref 98–111)
Creatinine: 0.72 mg/dL (ref 0.44–1.00)
GFR, Estimated: >60 mL/min (ref >60)
Glucose, Bld: 104 mg/dL — ABNORMAL HIGH (ref 70–99)
Potassium: 3.6 mmol/L (ref 3.5–5.1)
Sodium: 141 mmol/L (ref 135–145)
Total Bilirubin: 0.4 mg/dL (ref 0.0–1.2)
Total Protein: 7.3 g/dL (ref 6.5–8.1)

## 2024-01-13 LAB — CBC WITH DIFFERENTIAL (CANCER CENTER ONLY)
Abs Immature Granulocytes: 0.02 10*3/uL (ref 0.00–0.07)
Basophils Absolute: 0.1 10*3/uL (ref 0.0–0.1)
Basophils Relative: 1 %
Eosinophils Absolute: 0.2 10*3/uL (ref 0.0–0.5)
Eosinophils Relative: 2 %
HCT: 34.4 % — ABNORMAL LOW (ref 36.0–46.0)
Hemoglobin: 12.5 g/dL (ref 12.0–15.0)
Immature Granulocytes: 0 %
Lymphocytes Relative: 29 %
Lymphs Abs: 2.6 10*3/uL (ref 0.7–4.0)
MCH: 25.3 pg — ABNORMAL LOW (ref 26.0–34.0)
MCHC: 36.3 g/dL — ABNORMAL HIGH (ref 30.0–36.0)
MCV: 69.5 fL — ABNORMAL LOW (ref 80.0–100.0)
Monocytes Absolute: 1.2 10*3/uL — ABNORMAL HIGH (ref 0.1–1.0)
Monocytes Relative: 13 %
Neutro Abs: 5 10*3/uL (ref 1.7–7.7)
Neutrophils Relative %: 55 %
Platelet Count: 259 10*3/uL (ref 150–400)
RBC: 4.95 MIL/uL (ref 3.87–5.11)
RDW: 20.4 % — ABNORMAL HIGH (ref 11.5–15.5)
WBC Count: 9 10*3/uL (ref 4.0–10.5)
nRBC: 0 % (ref 0.0–0.2)

## 2024-01-13 LAB — FERRITIN: Ferritin: 48 ng/mL (ref 11–307)

## 2024-01-13 LAB — TOTAL PROTEIN, URINE DIPSTICK: Protein, ur: NEGATIVE mg/dL

## 2024-01-13 MED ORDER — SODIUM CHLORIDE 0.9% FLUSH
10.0000 mL | Freq: Once | INTRAVENOUS | Status: AC
  Administered 2024-01-13: 10 mL

## 2024-01-13 MED ORDER — OXYCODONE HCL 5 MG PO TABS: 5.0000 mg | ORAL_TABLET | ORAL | 0 refills | Status: DC | PRN

## 2024-01-13 MED ORDER — SODIUM CHLORIDE 0.9% FLUSH: 10.0000 mL | INTRAVENOUS | Status: DC | PRN

## 2024-01-13 MED ORDER — PALONOSETRON HCL INJECTION 0.25 MG/5ML
0.2500 mg | Freq: Once | INTRAVENOUS | Status: AC
  Administered 2024-01-13: 0.25 mg via INTRAVENOUS
  Filled 2024-01-13: qty 5

## 2024-01-13 MED ORDER — DEXAMETHASONE SODIUM PHOSPHATE 10 MG/ML IJ SOLN
10.0000 mg | Freq: Once | INTRAMUSCULAR | Status: AC
  Administered 2024-01-13: 10 mg via INTRAVENOUS
  Filled 2024-01-13: qty 1

## 2024-01-13 MED ORDER — LORAZEPAM 1 MG PO TABS
0.5000 mg | ORAL_TABLET | Freq: Once | ORAL | Status: AC
  Administered 2024-01-13: 0.5 mg via ORAL
  Filled 2024-01-13: qty 1

## 2024-01-13 MED ORDER — GABAPENTIN 300 MG PO CAPS: 300.0000 mg | ORAL_CAPSULE | Freq: Two times a day (BID) | ORAL | 0 refills | Status: DC

## 2024-01-13 MED ORDER — FLUOROURACIL CHEMO INJECTION 2.5 GM/50ML
400.0000 mg/m2 | Freq: Once | INTRAVENOUS | Status: AC
  Administered 2024-01-13: 600 mg via INTRAVENOUS
  Filled 2024-01-13: qty 12

## 2024-01-13 MED ORDER — SODIUM CHLORIDE 0.9 % IV SOLN
5.0000 mg/kg | Freq: Once | INTRAVENOUS | Status: AC
  Administered 2024-01-13: 250 mg via INTRAVENOUS
  Filled 2024-01-13: qty 10

## 2024-01-13 MED ORDER — LEUCOVORIN CALCIUM INJECTION 350 MG
400.0000 mg/m2 | Freq: Once | INTRAVENOUS | Status: AC
  Administered 2024-01-13: 600 mg via INTRAVENOUS
  Filled 2024-01-13: qty 30

## 2024-01-13 MED ORDER — SODIUM CHLORIDE 0.9 % IV SOLN: INTRAVENOUS | Status: DC

## 2024-01-13 MED ORDER — SODIUM CHLORIDE 0.9 % IV SOLN
2400.0000 mg/m2 | INTRAVENOUS | Status: DC
  Administered 2024-01-13: 3500 mg via INTRAVENOUS
  Filled 2024-01-13: qty 70

## 2024-01-13 MED ORDER — HEPARIN SOD (PORK) LOCK FLUSH 100 UNIT/ML IV SOLN
500.0000 [IU] | Freq: Once | INTRAVENOUS | Status: DC | PRN
Start: 2024-01-13 — End: 2024-01-13

## 2024-01-13 MED ORDER — LORAZEPAM 0.5 MG PO TABS: 0.5000 mg | ORAL_TABLET | Freq: Three times a day (TID) | ORAL | 0 refills | Status: DC | PRN

## 2024-01-13 NOTE — Progress Notes (Signed)
 [facility]   Telephone:([phone]) [phone] Fax:([phone]) [phone]   Clinic Follow up Note   Patient Care Team: Celestia Rosaline SQUIBB, NP as PCP - General (Internal Medicine) Lanny Callander, MD as Consulting Physician (Hematology and Oncology) Debby Hila, MD as Consulting Physician (General Surgery) Stacia Glendia BRAVO, MD as Consulting Physician (Gastroenterology) [name], Fannie SAILOR, NP as Nurse Practitioner [facility] and Palliative Medicine)  Date of Service:  01/13/2024  CHIEF COMPLAINT: f/u of colon cancer   CURRENT THERAPY:  Adjuvant FOLFOX  Oncology History   Colon cancer metastasized to mesenteric lymph nodes (HCC) -[id number] with mesentery node and possible liver metastasis, G2, MMR proficient, KRAS G12D mutation (+) -Patient initially presented with abdominal pain to the emergency room, underwent a colonoscopy in October 2024 which showed a large mass in the splenic lecture of left side colon. -She underwent left hemicolectomy on June 26, 2023.  Large mesenteric mass was also biopsied which confirmed metastatic adenocarcinoma. -Staging CT scan and a liver MRI showed a suspicious 0.8 cm mass in the left lobe of liver, concerning for metastasis.  No other evidence of distant metastatic disease. Liver biopsy on 08/27/2023 was benign.  -I discussed her metastatic disease and the probable incurable nature of her disease. -I recommend systemic treatment with chemotherapy FOLFOX, she started on 08/06/23. -NGS [facility] showed KRAS G12D and PIK3CA mutations, not a candidate for targeted therapy  -Her tumor was tested of for MMR which were normal, (+) KRAS mutation, not a candidate for targeted or immunotherapy. Beva added from cycle 2  -CT 10/20/2023 was negative for residual disease   Assessment & Plan Colon cancer, stage 3 Stage 3 colon cancer with previous chemotherapy treatment. High risk for recurrence due to advanced tumor stage and possible liver metastasis, although  not confirmed. Currently completing cycle twelve of chemotherapy with a planned break. - Order CT scan in mid to late July - Order liver MRI in the next few weeks - Perform tumor marker test (Guardian review) in the future - Continue monitoring with scans for the next five years - Keep the port for potential future chemotherapy  Chemotherapy-induced peripheral neuropathy Daily tingling and numbness in hands and feet affecting sleep, likely a side effect of chemotherapy. Gabapentin  was intended but not prescribed. Gabapentin  can cause drowsiness. - Call in gabapentin  prescription - Advise taking gabapentin  at night initially, then twice a day if tolerated - Monitor for drowsiness as a side effect of gabapentin   Hernia, mild Mild abdominal hernia that occasionally protrudes but can be reduced. Not causing significant issues, surgical intervention not necessary at this time. - Monitor hernia for any changes or increased symptoms - Discuss option to see a surgeon if symptoms worsen  Plan - Lab reviewed, adequate for treatment, will proceed with cycle 12 tomorrow with 5-FU/LV today - Will proceed with cancer surveillance next, follow-up in 4 weeks with lab and liver MRI 1 week prior   SUMMARY OF ONCOLOGIC HISTORY: Oncology History  Colon cancer metastasized to mesenteric lymph nodes (HCC)  06/26/2023 Cancer Staging   Staging form: Colon and Rectum, AJCC 8th Edition - Pathologic stage from 06/26/2023: Stage IVC (pT4a, pN1b, pM1c) - Signed by Lanny Callander, MD on 07/25/2023 Residual tumor (R): R1 - Microscopic   07/25/2023 Initial Diagnosis   Colon cancer metastasized to mesenteric lymph nodes (HCC)   08/06/2023 -  Chemotherapy   Patient is on Treatment Plan : COLORECTAL FOLFOX + Bevacizumab  q14d        Discussed the use of AI  scribe software for clinical note transcription with the patient, who gave verbal consent to proceed.  History of Present Illness Carol Mack is a 53 year old  female with colon cancer who presents for follow-up.  She experiences daily tingling and numbness in her hands and feet, likely due to chemotherapy, which disrupts her sleep. She has not yet picked up the prescribed gabapentin  for these symptoms. Her treatment includes 5-fluorouracil , with the last dose administered two weeks ago. She also uses oxycodone , albuterol , and B complex vitamins. She inquires about the possibility of undergoing dental procedures while on chemotherapy. She mentions a mild hernia in her stomach that occasionally protrudes but can be manually reduced. She recently traveled to [place] and expresses interest in alternative medicine, specifically herbs for cancer treatment. Her last CT scan was conducted in June, and she plans to 'ring the bell' to signify the end of her treatment cycle.     All other systems were reviewed with the patient and are negative.  MEDICAL HISTORY:  Past Medical History:  Diagnosis Date   Anemia    Asthma    Bartholin cyst    Chiari malformation type I (HCC)    Hernia, inguinal    Hernia, inguinal    Hypertension    ILD (interstitial lung disease) (HCC)     SURGICAL HISTORY: Past Surgical History:  Procedure Laterality Date   fibroid freezing     unsure   IR GENERIC HISTORICAL  07/27/2014   IR RADIOLOGIST EVAL & MGMT 07/27/2014 Juliene Balder, MD [facility] INTERV RAD   IR PORT REPAIR CENTRAL VENOUS ACCESS DEVICE  10/06/2023   PORTACATH PLACEMENT N/A 07/31/2023   Procedure: PLACEMENT OF PORT A CATHETER UNDER ULTRASOUND AND FLUOROSCOPIC GUIDANCE;  Surgeon: Sheldon Standing, MD;  Location: [facility];  Service: General;  Laterality: N/A;  GEN WITH ERAS 1.5 HOURS TOTAL   ROBOTIC ASSISTED TOTAL HYSTERECTOMY N/A 09/15/2018   Procedure: XI ROBOTIC ASSISTED TOTAL HYSTERECTOMY WITH SALPINGECTOMY;  Surgeon: Corene Coy, MD;  Location: [facility];  Service: Gynecology;  Laterality: N/A;    I have reviewed the social history and family  history with the patient and they are unchanged from previous note.  ALLERGIES:  is allergic to hydrocodone .  MEDICATIONS:  Current Outpatient Medications  Medication Sig Dispense Refill   albuterol  (VENTOLIN  HFA) 108 (90 Base) MCG/ACT inhaler Inhale 1-2 puffs into the lungs every 6 (six) hours as needed for shortness of breath. 2 each 1   amLODipine  (NORVASC ) 10 MG tablet Take 1 tablet (10 mg total) by mouth daily. 30 tablet 2   b complex vitamins capsule Take 1 capsule by mouth daily. 30 capsule 0   dronabinol  (MARINOL ) 2.5 MG capsule Take 2 capsules (5 mg total) by mouth 3 (three) times daily before meals. 90 capsule 1   ferrous sulfate  325 (65 FE) MG tablet Take 1 tablet (325 mg total) by mouth daily with breakfast. 90 tablet 0   gabapentin  (NEURONTIN ) 300 MG capsule Take 1 capsule (300 mg total) by mouth 2 (two) times daily. Take 1 cap at bed time only for 1-2 weeks first before you take twice daily 60 capsule 0   lidocaine -prilocaine  (EMLA ) cream Apply to affected area once (Patient taking differently: Apply 1 Application topically as needed (for port access).) 30 g 3   magic mouthwash (nystatin , lidocaine , diphenhydrAMINE , alum & mag hydroxide) suspension Swish and swallow 5 mLs by mouth 4 (four) times daily as needed for mouth pain. 140 mL 1  mirtazapine  (REMERON ) 7.5 MG tablet Take 1 tablet (7.5 mg total) by mouth at bedtime. 30 tablet 0   ondansetron  (ZOFRAN ) 8 MG tablet Take 1 tablet (8 mg total) by mouth every 8 (eight) hours as needed for nausea or vomiting. Start on the third day after chemotherapy. 30 tablet 1   pantoprazole  (PROTONIX ) 40 MG tablet Take 1 tablet (40 mg total) by mouth daily. 30 tablet 0   polyethylene glycol (MIRALAX ) 17 g packet Take 17 g by mouth 2 (two) times daily. 30 each 0   prochlorperazine  (COMPAZINE ) 10 MG tablet Take 1 tablet (10 mg total) by mouth every 6 (six) hours as needed for nausea or vomiting. 30 tablet 1   senna-docusate (SENNA S) 8.6-50 MG  tablet Take 1 tablet by mouth daily. 60 tablet 3   LORazepam  (ATIVAN ) 0.5 MG tablet Take 1 tablet (0.5 mg total) by mouth every 8 (eight) hours as needed for anxiety. 45 tablet 0   oxyCODONE  (OXY IR/ROXICODONE ) 5 MG immediate release tablet Take 1 tablet (5 mg total) by mouth every 4 (four) hours as needed for severe pain (pain score 7-10). 60 tablet 0   No current facility-administered medications for this visit.   Facility-Administered Medications Ordered in Other Visits  Medication Dose Route Frequency Provider Last Rate Last Admin   0.9 %  sodium chloride  infusion   Intravenous Continuous Lanny Callander, MD   Stopped at 01/13/24 1314   fluorouracil  (ADRUCIL ) 3,500 mg in sodium chloride  0.9 % 80 mL chemo infusion  2,400 mg/m2 (Treatment Plan Recorded) Intravenous 1 day or 1 dose Lanny Callander, MD   Infusion Verify at 01/13/24 1340   heparin  lock flush 100 unit/mL  500 Units Intracatheter Once PRN Lanny Callander, MD       sodium chloride  flush (NS) 0.9 % injection 10 mL  10 mL Intracatheter PRN Lanny Callander, MD        PHYSICAL EXAMINATION: ECOG PERFORMANCE STATUS: 1 - Symptomatic but completely ambulatory  Vitals:   01/13/24 1102 01/13/24 1107  BP: (!) 138/98 (!) 112/90  Pulse: (!) 102   Resp: 16   Temp: 98.2 F (36.8 C)   SpO2: 99%    Wt Readings from Last 3 Encounters:  01/13/24 123 lb (55.8 kg)  01/05/24 122 lb 6.4 oz (55.5 kg)  12/31/23 121 lb 4.8 oz (55 kg)     GENERAL:alert, no distress and comfortable SKIN: skin color, texture, turgor are normal, no rashes or significant lesions EYES: normal, Conjunctiva are pink and non-injected, sclera clear NECK: supple, thyroid  normal size, non-tender, without nodularity LYMPH:  no palpable lymphadenopathy in the cervical, axillary  LUNGS: clear to auscultation and percussion with normal breathing effort HEART: regular rate & rhythm and no murmurs and no lower extremity edema ABDOMEN:abdomen soft, non-tender and normal bowel  sounds Musculoskeletal:no cyanosis of digits and no clubbing  NEURO: alert & oriented x 3 with fluent speech, no focal motor/sensory deficits  Physical Exam    LABORATORY DATA:  I have reviewed the data as listed    Latest Ref Rng & Units 01/13/2024   10:29 AM 12/31/2023   11:05 AM 12/17/2023   11:23 AM  CBC  WBC 4.0 - 10.5 K/uL 9.0  7.2  6.6   Hemoglobin 12.0 - 15.0 g/dL 87.4  87.3  87.4   Hematocrit 36.0 - 46.0 % 34.4  35.9  35.6   Platelets 150 - 400 K/uL 259  214  209         Latest  Ref Rng & Units 01/13/2024   10:29 AM 12/31/2023   11:05 AM 12/17/2023   11:23 AM  CMP  Glucose 70 - 99 mg/dL 895  885  887   BUN 6 - 20 mg/dL 12  12  11    Creatinine 0.44 - 1.00 mg/dL 9.27  9.31  9.33   Sodium 135 - 145 mmol/L 141  141  140   Potassium 3.5 - 5.1 mmol/L 3.6  3.9  3.7   Chloride 98 - 111 mmol/L 106  108  107   CO2 22 - 32 mmol/L 27  27  27    Calcium  8.9 - 10.3 mg/dL 9.5  9.5  9.4   Total Protein 6.5 - 8.1 g/dL 7.3  7.5  7.4   Total Bilirubin 0.0 - 1.2 mg/dL 0.4  0.4  0.4   Alkaline Phos 38 - 126 U/L 61  63  58   AST 15 - 41 U/L 15  17  17    ALT 0 - 44 U/L 10  10  9        RADIOGRAPHIC STUDIES: I have personally reviewed the radiological images as listed and agreed with the findings in the report. No results found.    Orders Placed This Encounter  Procedures   MR LIVER W WO CONTRAST    Standing Status:   Future    Expected Date:   02/03/2024    Expiration Date:   01/12/2025    If indicated for the ordered procedure, I authorize the administration of contrast media per Radiology protocol:   Yes    What is the patient's sedation requirement?:   No Sedation    Does the patient have a pacemaker or implanted devices?:   No    Preferred imaging location?:   [facility] (table limit - 500lbs)   All questions were answered. The patient knows to call the clinic with any problems, questions or concerns. No barriers to learning was detected. The total time spent in the  appointment was 25 minutes, including review of chart and various tests results, discussions about plan of care and coordination of care plan     Onita Mattock, MD 01/13/2024

## 2024-01-13 NOTE — Progress Notes (Signed)
 Patient made aware that 5FU pump will complete prior to 1400 pump d/c appointment Thursday.  Patient requested to keep 1400 pump d/c appointment.  Patient verbalized understanding of how to turn pump off if infusion completes before appointment time.

## 2024-01-13 NOTE — Progress Notes (Signed)
 [facility] CSW Progress Note  Visual merchandiser met w/ pt in infusion to follow-up on discuss a bell ringing ceremony.  Pt provided w/ the bell ringing poem.  CSW to meet pt and family on 7/3 for ceremony.          Devere JONELLE Manna, LCSW Clinical Social Worker [facility]    Patient is participating in a Managed Medicaid Plan:  Yes

## 2024-01-13 NOTE — Patient Instructions (Signed)
 [facility] - A DEPT OF [facility]  Discharge Instructions: Thank you for choosing [facility] to provide your oncology and hematology care.   If you have a lab appointment with the Cancer Center, please go directly to the Cancer Center and check in at the registration area.   Wear comfortable clothing and clothing appropriate for easy access to any Portacath or PICC line.   We strive to give you quality time with your provider. You may need to reschedule your appointment if you arrive late (15 or more minutes).  Arriving late affects you and other patients whose appointments are after yours.  Also, if you miss three or more appointments without notifying the office, you may be dismissed from the clinic at the provider's discretion.      For prescription refill requests, have your pharmacy contact our office and allow 72 hours for refills to be completed.    Today you received the following chemotherapy and/or immunotherapy agents: Bevacizumab , Leucovorin , 5FU      To help prevent nausea and vomiting after your treatment, we encourage you to take your nausea medication as directed.  BELOW ARE SYMPTOMS THAT SHOULD BE REPORTED IMMEDIATELY: *FEVER GREATER THAN 100.4 F (38 C) OR HIGHER *CHILLS OR SWEATING *NAUSEA AND VOMITING THAT IS NOT CONTROLLED WITH YOUR NAUSEA MEDICATION *UNUSUAL SHORTNESS OF BREATH *UNUSUAL BRUISING OR BLEEDING *URINARY PROBLEMS (pain or burning when urinating, or frequent urination) *BOWEL PROBLEMS (unusual diarrhea, constipation, pain near the anus) TENDERNESS IN MOUTH AND THROAT WITH OR WITHOUT PRESENCE OF ULCERS (sore throat, sores in mouth, or a toothache) UNUSUAL RASH, SWELLING OR PAIN  UNUSUAL VAGINAL DISCHARGE OR ITCHING   Items with * indicate a potential emergency and should be followed up as soon as possible or go to the Emergency Department if any problems should occur.  Please show the CHEMOTHERAPY ALERT  CARD or IMMUNOTHERAPY ALERT CARD at check-in to the Emergency Department and triage nurse.  Should you have questions after your visit or need to cancel or reschedule your appointment, please contact [facility] - A DEPT OF [facility]  Dept: [phone]  and follow the prompts.  Office hours are 8:00 a.m. to 4:30 p.m. Monday - Friday. Please note that voicemails left after 4:00 p.m. may not be returned until the following business day.  We are closed weekends and major holidays. You have access to a nurse at all times for urgent questions. Please call the main number to the clinic Dept: [phone] and follow the prompts.   For any non-urgent questions, you may also contact your provider using MyChart. We now offer e-Visits for anyone 56 and older to request care online for non-urgent symptoms. For details visit [email or web address].   Also download the MyChart app! Go to the app store, search MyChart, open the app, select [facility], and log in with your MyChart username and password.

## 2024-01-14 ENCOUNTER — Encounter: Payer: Self-pay | Admitting: Hematology

## 2024-01-14 ENCOUNTER — Encounter: Payer: Self-pay | Admitting: Nurse Practitioner

## 2024-01-14 NOTE — Progress Notes (Signed)
 Palliative Medicine [facility]  [phone] [phone]) [phone]   Name: Carol Mack Date: 01/14/2024 MRN: [id number]  DOB: [birth year]  Patient Care Team: Celestia Rosaline SQUIBB, NP as PCP - General (Internal Medicine) Lanny Callander, MD as Consulting Physician (Hematology and Oncology) Debby Hila, MD as Consulting Physician (General Surgery) Stacia Glendia BRAVO, MD as Consulting Physician (Gastroenterology) [name], Fannie SAILOR, NP as Nurse Practitioner (Hospice and Palliative Medicine)    INTERVAL HISTORY: Carol Mack is a 53 y.o. female with oncologic medical history including metastatic colon cancer with liver involvement.  Palliative is seeing patient for symptom management and goals of care.   SOCIAL HISTORY:     reports that she has quit smoking. Her smoking use included cigarettes. She started smoking about 29 years ago. She has a 29.5 pack-year smoking history. She has been exposed to tobacco smoke. She has never used smokeless tobacco. She reports that she does not drink alcohol and does not use drugs.  ADVANCE DIRECTIVES:  None on file   CODE STATUS: Full code  PAST MEDICAL HISTORY: Past Medical History:  Diagnosis Date   Anemia    Asthma    Bartholin cyst    Chiari malformation type I (HCC)    Hernia, inguinal    Hernia, inguinal    Hypertension    ILD (interstitial lung disease) (HCC)     ALLERGIES:  is allergic to hydrocodone .  MEDICATIONS:  Current Outpatient Medications  Medication Sig Dispense Refill   albuterol  (VENTOLIN  HFA) 108 (90 Base) MCG/ACT inhaler Inhale 1-2 puffs into the lungs every 6 (six) hours as needed for shortness of breath. 2 each 1   amLODipine  (NORVASC ) 10 MG tablet Take 1 tablet (10 mg total) by mouth daily. 30 tablet 2   b complex vitamins capsule Take 1 capsule by mouth daily. 30 capsule 0   dronabinol  (MARINOL ) 2.5 MG capsule Take 2 capsules (5 mg total) by mouth 3 (three) times daily before  meals. 90 capsule 1   ferrous sulfate  325 (65 FE) MG tablet Take 1 tablet (325 mg total) by mouth daily with breakfast. 90 tablet 0   gabapentin  (NEURONTIN ) 300 MG capsule Take 1 capsule (300 mg total) by mouth 2 (two) times daily. Take 1 cap at bed time only for 1-2 weeks first before you take twice daily 60 capsule 0   lidocaine -prilocaine  (EMLA ) cream Apply to affected area once (Patient taking differently: Apply 1 Application topically as needed (for port access).) 30 g 3   LORazepam  (ATIVAN ) 0.5 MG tablet Take 1 tablet (0.5 mg total) by mouth every 8 (eight) hours as needed for anxiety. 45 tablet 0   magic mouthwash (nystatin , lidocaine , diphenhydrAMINE , alum & mag hydroxide) suspension Swish and swallow 5 mLs by mouth 4 (four) times daily as needed for mouth pain. 140 mL 1   mirtazapine  (REMERON ) 7.5 MG tablet Take 1 tablet (7.5 mg total) by mouth at bedtime. 30 tablet 0   ondansetron  (ZOFRAN ) 8 MG tablet Take 1 tablet (8 mg total) by mouth every 8 (eight) hours as needed for nausea or vomiting. Start on the third day after chemotherapy. 30 tablet 1   oxyCODONE  (OXY IR/ROXICODONE ) 5 MG immediate release tablet Take 1 tablet (5 mg total) by mouth every 4 (four) hours as needed for severe pain (pain score 7-10). 60 tablet 0   pantoprazole  (PROTONIX ) 40 MG tablet Take 1 tablet (40 mg total) by mouth daily. 30 tablet 0   polyethylene glycol (MIRALAX ) 17  g packet Take 17 g by mouth 2 (two) times daily. 30 each 0   prochlorperazine  (COMPAZINE ) 10 MG tablet Take 1 tablet (10 mg total) by mouth every 6 (six) hours as needed for nausea or vomiting. 30 tablet 1   senna-docusate (SENNA S) 8.6-50 MG tablet Take 1 tablet by mouth daily. 60 tablet 3   No current facility-administered medications for this visit.    VITAL SIGNS: LMP  (LMP Unknown)  There were no vitals filed for this visit.  Estimated body mass index is 21.11 kg/m as calculated from the following:   Height as of an earlier encounter on  01/13/24: 5' 4 (1.626 m).   Weight as of an earlier encounter on 01/13/24: 123 lb (55.8 kg).   PERFORMANCE STATUS (ECOG) : 1 - Symptomatic but completely ambulatory  Physical Exam General: NAD Cardiovascular: regular rate and rhythm Pulmonary: normal breathing pattern Extremities: no edema, no joint deformities Skin: no rashes Neurological: AAO x3  IMPRESSION: Discussed the use of AI scribe software for clinical note transcription with the patient, who gave verbal consent to proceed.  History of Present Illness [name] is a 53 year old female who was seen during infusion. No acute distress. States she is doing well overall. Denies concerns of nausea, vomiting, constipation, or diarrhea. Occasional fatigue. Trying to remain active. Appetite fluctuates. Some  days are better than others. Weight is up to 123lbs from 119lbs from 116lbs.   She discusses her treatment journey and mentions her hope to 'ring the bell' soon, indicating a milestone in her cancer treatment. However, she acknowledges that she has 'another journey after that', suggesting ongoing treatment or monitoring.  Ms. Babinski reports her pain is well controlled. Denies pain during visit. She is taking oxycodone  as needed. Does not require around the clock. No adjustments at this time. Anxiety is managed with as needed ativan . Patient is taking medications appropriately. Well overdue for refills.   All questions answered and support provided.  I discussed the importance of continued conversation with family and their medical providers regarding overall plan of care and treatment options, ensuring decisions are within the context of the patients values and GOCs. Assessment & Plan Cancer related pain management Pain is well-managed with oxycodone .  No adjustments to current regimen. - Continue oxycodone  5 mg every four to six hours as needed.  Anxiety Anxiety significantly improved with lorazepam . - Continue lorazepam  two  to three times daily.  Cancer treatment follow-up Concluding treatment cycle with optimistic prognosis. Condition well-managed. - Verify and dispatch all medications. - Attend bell ceremony on Thursday for support.  Appetite improvement Appetite improving with weight increase. - Prescribe Marinol  2.5 mg for appetite improvement, three times daily with meals. Currently on nationwide back-order  Follow-up Advised to return for follow-up care as needed. - Schedule follow-up in 4-6 weeks.   Patient expressed understanding and was in agreement with this plan. She also understands that She can call the clinic at any time with any questions, concerns, or complaints.   Any controlled substances utilized were prescribed in the context of palliative care. PDMP has been reviewed.   Visit consisted of counseling and education dealing with the complex and emotionally intense issues of symptom management and palliative care in the setting of serious and potentially life-threatening illness.  Levon Borer, AGPCNP-BC  Palliative Medicine Team/ Cancer Center

## 2024-01-15 ENCOUNTER — Other Ambulatory Visit: Payer: Self-pay

## 2024-01-15 ENCOUNTER — Inpatient Hospital Stay: Admitting: Licensed Clinical Social Worker

## 2024-01-15 ENCOUNTER — Inpatient Hospital Stay

## 2024-01-15 VITALS — BP 139/106 | HR 99 | Temp 98.6°F | Resp 17

## 2024-01-15 DIAGNOSIS — Z5112 Encounter for antineoplastic immunotherapy: Secondary | ICD-10-CM | POA: Diagnosis not present

## 2024-01-15 DIAGNOSIS — C189 Malignant neoplasm of colon, unspecified: Secondary | ICD-10-CM

## 2024-01-15 MED ORDER — HEPARIN SOD (PORK) LOCK FLUSH 100 UNIT/ML IV SOLN
500.0000 [IU] | Freq: Once | INTRAVENOUS | Status: AC | PRN
  Administered 2024-01-15: 500 [IU]

## 2024-01-15 MED ORDER — ONDANSETRON HCL 8 MG PO TABS: 8.0000 mg | ORAL_TABLET | Freq: Three times a day (TID) | ORAL | 1 refills | Status: DC | PRN

## 2024-01-15 MED ORDER — SODIUM CHLORIDE 0.9% FLUSH: 10.0000 mL | INTRAVENOUS | Status: DC | PRN

## 2024-01-15 NOTE — Progress Notes (Signed)
 [facility] CSW Progress Note  Visual merchandiser joined patient, friends and family at the hope bell for a bell ringing ceremony.       Devere JONELLE Manna, LCSW Clinical Social Worker [facility]    Patient is participating in a Managed Medicaid Plan:  Yes

## 2024-01-21 ENCOUNTER — Other Ambulatory Visit: Payer: Self-pay

## 2024-01-22 ENCOUNTER — Telehealth: Payer: Self-pay | Admitting: Hematology

## 2024-01-22 NOTE — Telephone Encounter (Signed)
 Scheduled appointments per los. Called and left a VM with appointment details for this patient.

## 2024-02-03 ENCOUNTER — Ambulatory Visit (HOSPITAL_COMMUNITY): Admission: RE | Admit: 2024-02-03 | Source: Ambulatory Visit

## 2024-02-03 ENCOUNTER — Other Ambulatory Visit: Payer: Self-pay

## 2024-02-03 ENCOUNTER — Telehealth: Payer: Self-pay

## 2024-02-03 ENCOUNTER — Inpatient Hospital Stay

## 2024-02-03 DIAGNOSIS — C189 Malignant neoplasm of colon, unspecified: Secondary | ICD-10-CM

## 2024-02-03 DIAGNOSIS — Z5112 Encounter for antineoplastic immunotherapy: Secondary | ICD-10-CM | POA: Diagnosis not present

## 2024-02-03 DIAGNOSIS — D649 Anemia, unspecified: Secondary | ICD-10-CM

## 2024-02-03 LAB — COMPREHENSIVE METABOLIC PANEL WITH GFR
ALT: 9 U/L (ref 0–44)
AST: 15 U/L (ref 15–41)
Albumin: 3.8 g/dL (ref 3.5–5.0)
Alkaline Phosphatase: 67 U/L (ref 38–126)
Anion gap: 7 (ref 5–15)
BUN: 13 mg/dL (ref 6–20)
CO2: 26 mmol/L (ref 22–32)
Calcium: 9.3 mg/dL (ref 8.9–10.3)
Chloride: 106 mmol/L (ref 98–111)
Creatinine, Ser: 0.76 mg/dL (ref 0.44–1.00)
GFR, Estimated: >60 mL/min (ref >60)
Glucose, Bld: 126 mg/dL — ABNORMAL HIGH (ref 70–99)
Potassium: 3.6 mmol/L (ref 3.5–5.1)
Sodium: 139 mmol/L (ref 135–145)
Total Bilirubin: 0.5 mg/dL (ref 0.0–1.2)
Total Protein: 7.4 g/dL (ref 6.5–8.1)

## 2024-02-03 LAB — CBC WITH DIFFERENTIAL/PLATELET
Abs Immature Granulocytes: 0.02 K/uL (ref 0.00–0.07)
Basophils Absolute: 0.1 K/uL (ref 0.0–0.1)
Basophils Relative: 1 %
Eosinophils Absolute: 0.1 K/uL (ref 0.0–0.5)
Eosinophils Relative: 2 %
HCT: 36.2 % (ref 36.0–46.0)
Hemoglobin: 12.7 g/dL (ref 12.0–15.0)
Immature Granulocytes: 0 %
Lymphocytes Relative: 45 %
Lymphs Abs: 2.7 K/uL (ref 0.7–4.0)
MCH: 25 pg — ABNORMAL LOW (ref 26.0–34.0)
MCHC: 35.1 g/dL (ref 30.0–36.0)
MCV: 71.3 fL — ABNORMAL LOW (ref 80.0–100.0)
Monocytes Absolute: 1.1 K/uL — ABNORMAL HIGH (ref 0.1–1.0)
Monocytes Relative: 17 %
Neutro Abs: 2.2 K/uL (ref 1.7–7.7)
Neutrophils Relative %: 35 %
Platelets: 270 K/uL (ref 150–400)
RBC: 5.08 MIL/uL (ref 3.87–5.11)
RDW: 20.7 % — ABNORMAL HIGH (ref 11.5–15.5)
WBC: 6.1 K/uL (ref 4.0–10.5)
nRBC: 0 % (ref 0.0–0.2)

## 2024-02-03 LAB — FERRITIN: Ferritin: 47 ng/mL (ref 11–307)

## 2024-02-03 NOTE — Telephone Encounter (Signed)
 Contacted patient via telephone call.  Unable to reach the patient.  Left a voice message letting her know the doctor's note that she requested is completed, that is has been uploaded to her MyChart. Let patient know if she wanted to pick-up a hard copy then to c/b to let me know so that it can be let up front for her to pick-up.

## 2024-02-03 NOTE — Telephone Encounter (Signed)
 Patient requesting a doctor's note stating she can return to work with no restrictions. Patient would like this note to be uploaded to her MyChart. Let patient know that I would forward her request to Dr. Lanny and follow-up w/ her via telephone call. Patient voiced understanding.

## 2024-02-04 ENCOUNTER — Ambulatory Visit (HOSPITAL_COMMUNITY)
Admission: RE | Admit: 2024-02-04 | Discharge: 2024-02-04 | Disposition: A | Discharge status: Home / Self Care | Source: Ambulatory Visit | Attending: Hematology | Admitting: Hematology

## 2024-02-04 DIAGNOSIS — C772 Secondary and unspecified malignant neoplasm of intra-abdominal lymph nodes: Secondary | ICD-10-CM | POA: Diagnosis present

## 2024-02-04 DIAGNOSIS — C189 Malignant neoplasm of colon, unspecified: Secondary | ICD-10-CM | POA: Insufficient documentation

## 2024-02-04 MED ORDER — GADOBUTROL 1 MMOL/ML IV SOLN
6.0000 mL | Freq: Once | INTRAVENOUS | Status: AC | PRN
  Administered 2024-02-04: 6 mL via INTRAVENOUS

## 2024-02-10 ENCOUNTER — Encounter: Payer: Self-pay | Admitting: Nurse Practitioner

## 2024-02-10 ENCOUNTER — Inpatient Hospital Stay (HOSPITAL_BASED_OUTPATIENT_CLINIC_OR_DEPARTMENT_OTHER): Admitting: Nurse Practitioner

## 2024-02-10 ENCOUNTER — Other Ambulatory Visit: Payer: Self-pay

## 2024-02-10 ENCOUNTER — Inpatient Hospital Stay (HOSPITAL_BASED_OUTPATIENT_CLINIC_OR_DEPARTMENT_OTHER): Admitting: Hematology

## 2024-02-10 ENCOUNTER — Other Ambulatory Visit (HOSPITAL_COMMUNITY): Payer: Self-pay

## 2024-02-10 DIAGNOSIS — R634 Abnormal weight loss: Secondary | ICD-10-CM

## 2024-02-10 DIAGNOSIS — F419 Anxiety disorder, unspecified: Secondary | ICD-10-CM

## 2024-02-10 DIAGNOSIS — Z515 Encounter for palliative care: Secondary | ICD-10-CM | POA: Diagnosis not present

## 2024-02-10 DIAGNOSIS — C189 Malignant neoplasm of colon, unspecified: Secondary | ICD-10-CM

## 2024-02-10 DIAGNOSIS — C772 Secondary and unspecified malignant neoplasm of intra-abdominal lymph nodes: Secondary | ICD-10-CM

## 2024-02-10 DIAGNOSIS — Z5112 Encounter for antineoplastic immunotherapy: Secondary | ICD-10-CM | POA: Diagnosis not present

## 2024-02-10 DIAGNOSIS — G4709 Other insomnia: Secondary | ICD-10-CM

## 2024-02-10 DIAGNOSIS — G893 Neoplasm related pain (acute) (chronic): Secondary | ICD-10-CM

## 2024-02-10 DIAGNOSIS — M792 Neuralgia and neuritis, unspecified: Secondary | ICD-10-CM

## 2024-02-10 MED ORDER — DRONABINOL 5 MG PO CAPS
5.0000 mg | ORAL_CAPSULE | Freq: Two times a day (BID) | ORAL | 2 refills | Status: AC
  Filled 2024-02-10 – 2024-03-19 (×2): qty 60, 30d supply, fill #0

## 2024-02-10 MED ORDER — OXYCODONE HCL 5 MG PO TABS
5.0000 mg | ORAL_TABLET | ORAL | 0 refills | Status: DC | PRN
Start: 2024-02-10 — End: 2024-03-09
  Filled 2024-02-10: qty 60, 10d supply, fill #0

## 2024-02-10 MED ORDER — LORAZEPAM 0.5 MG PO TABS
0.5000 mg | ORAL_TABLET | Freq: Three times a day (TID) | ORAL | 0 refills | Status: DC | PRN
  Filled 2024-02-10: qty 30, 10d supply, fill #0

## 2024-02-10 NOTE — Assessment & Plan Note (Signed)
-  [id number] with mesentery node and possible liver metastasis, G2, MMR proficient, KRAS G12D mutation (+) -Patient initially presented with abdominal pain to the emergency room, underwent a colonoscopy in October 2024 which showed a large mass in the splenic lecture of left side colon. -She underwent left hemicolectomy on June 26, 2023.  Large mesenteric mass was also biopsied which confirmed metastatic adenocarcinoma. -Staging CT scan and a liver MRI showed a suspicious 0.8 cm mass in the left lobe of liver, concerning for metastasis.  No other evidence of distant metastatic disease. Liver biopsy on 08/27/2023 was benign.  -I discussed her metastatic disease and the probable incurable nature of her disease. -I recommend systemic treatment with chemotherapy FOLFOX, she started on 08/06/23. -NGS Tempus showed KRAS G12D and PIK3CA mutations, not a candidate for targeted therapy  -Her tumor was tested of for MMR which were normal, (+) KRAS mutation, not a candidate for targeted or immunotherapy. Beva added from cycle 2, she completed 12 cycles chemo on 01/13/2024 -CT 10/20/2023 was negative for residual disease. Abdominal MRI 02/05/2024 was negative for liver mets or other residual disease  - Will continue cancer surveillance with imaging and ctDNA

## 2024-02-10 NOTE — Progress Notes (Signed)
 [facility]   [phone] [phone]) [phone]   Clinic Follow up Note   Patient Care Team: Celestia Rosaline SQUIBB, NP as PCP - General (Internal Medicine) Lanny Callander, MD as Consulting Physician (Hematology and Oncology) Debby Hila, MD as Consulting Physician (General Surgery) Stacia Glendia BRAVO, MD as Consulting Physician (Gastroenterology) [name], Fannie SAILOR, NP as Nurse Practitioner [facility] and Palliative Medicine)  Date of Service:  02/10/2024  CHIEF COMPLAINT: f/u of colon cancer  CURRENT THERAPY:  Cancer surveillance  Oncology History   Colon cancer metastasized to mesenteric lymph nodes (HCC) -[id number] with mesentery node and possible liver metastasis, G2, MMR proficient, KRAS G12D mutation (+) -Patient initially presented with abdominal pain to the emergency room, underwent a colonoscopy in October 2024 which showed a large mass in the splenic lecture of left side colon. -She underwent left hemicolectomy on June 26, 2023.  Large mesenteric mass was also biopsied which confirmed metastatic adenocarcinoma. -Staging CT scan and a liver MRI showed a suspicious 0.8 cm mass in the left lobe of liver, concerning for metastasis.  No other evidence of distant metastatic disease. Liver biopsy on 08/27/2023 was benign.  -I discussed her metastatic disease and the probable incurable nature of her disease. -I recommend systemic treatment with chemotherapy FOLFOX, she started on 08/06/23. -NGS [facility] showed KRAS G12D and PIK3CA mutations, not a candidate for targeted therapy  -Her tumor was tested of for MMR which were normal, (+) KRAS mutation, not a candidate for targeted or immunotherapy. Beva added from cycle 2, she completed 12 cycles chemo on 01/13/2024 -CT 10/20/2023 was negative for residual disease. Abdominal MRI 02/05/2024 was negative for liver mets or other residual disease  - Will continue cancer surveillance with imaging and  ctDNA  Assessment & Plan Colon cancer, status post chemotherapy, in surveillance Colon cancer in remission following chemotherapy. No current hepatic metastasis, but potential microscopic disease could lead to recurrence. Surveillance is crucial for early detection and potential cure. - Schedule CT scan in October - Schedule follow-up appointment in two months - Perform lab and port flush in four weeks - Consider Signatera blood test for microscopic disease at next visit  Chemotherapy-induced peripheral neuropathy Persistent numbness and occasional tingling in hands and feet post-chemotherapy. Gabapentin  ineffective. Symptoms affect daily activities, such as opening cans. Neuropathy expected to improve over time, but no immediate resolution available. - Recommend B complex vitamins to support nerve regeneration - Encourage exercise to aid nerve recovery  Plan - She is clinically doing well overall - Liver MRI scan images reviewed, results discussed with patient. - Will continue cancer surveillance. - Will start CT DNA Signatera testing in 4 weeks with her next port flush, and every 3 months - Follow-up in 2 months, plan to repeat CT chest, abdomen pelvis in 4 months.   SUMMARY OF ONCOLOGIC HISTORY: Oncology History  Colon cancer metastasized to mesenteric lymph nodes (HCC)  06/26/2023 Cancer Staging   Staging form: Colon and Rectum, AJCC 8th Edition - Pathologic stage from 06/26/2023: Stage IVC (pT4a, pN1b, pM1c) - Signed by Lanny Callander, MD on 07/25/2023 Residual tumor (R): R1 - Microscopic   07/25/2023 Initial Diagnosis   Colon cancer metastasized to mesenteric lymph nodes (HCC)   08/06/2023 -  Chemotherapy   Patient is on Treatment Plan : COLORECTAL FOLFOX + Bevacizumab  q14d        Discussed the use of AI scribe software for clinical note transcription with the patient, who gave verbal consent to proceed.  History of Present  Illness Carol Mack is a 53 year old female with  colon cancer who presents for follow-up.  She experiences persistent numbness in her feet and hands, affecting her ability to perform tasks. Tingling occurs occasionally, with numbness more bothersome at night. Gabapentin  was ineffective, and she is not currently taking it.  She has a port in place, which is uncomfortable and sore, particularly in her neck.  She expresses concern about her liver function and the possibility of cancer recurrence.     All other systems were reviewed with the patient and are negative.  MEDICAL HISTORY:  Past Medical History:  Diagnosis Date   Anemia    Asthma    Bartholin cyst    Chiari malformation type I (HCC)    Hernia, inguinal    Hernia, inguinal    Hypertension    ILD (interstitial lung disease) (HCC)     SURGICAL HISTORY: Past Surgical History:  Procedure Laterality Date   fibroid freezing     unsure   IR GENERIC HISTORICAL  07/27/2014   IR RADIOLOGIST EVAL & MGMT 07/27/2014 Juliene Balder, MD [facility] INTERV RAD   IR PORT REPAIR CENTRAL VENOUS ACCESS DEVICE  10/06/2023   PORTACATH PLACEMENT N/A 07/31/2023   Procedure: PLACEMENT OF PORT A CATHETER UNDER ULTRASOUND AND FLUOROSCOPIC GUIDANCE;  Surgeon: Sheldon Standing, MD;  Location: [facility];  Service: General;  Laterality: N/A;  GEN WITH ERAS 1.5 HOURS TOTAL   ROBOTIC ASSISTED TOTAL HYSTERECTOMY N/A 09/15/2018   Procedure: XI ROBOTIC ASSISTED TOTAL HYSTERECTOMY WITH SALPINGECTOMY;  Surgeon: Corene Coy, MD;  Location: [facility];  Service: Gynecology;  Laterality: N/A;    I have reviewed the social history and family history with the patient and they are unchanged from previous note.  ALLERGIES:  is allergic to hydrocodone .  MEDICATIONS:  Current Outpatient Medications  Medication Sig Dispense Refill   albuterol  (VENTOLIN  HFA) 108 (90 Base) MCG/ACT inhaler Inhale 1-2 puffs into the lungs every 6 (six) hours as needed for shortness of breath. 2 each 1   amLODipine  (NORVASC )  10 MG tablet Take 1 tablet (10 mg total) by mouth daily. 30 tablet 2   b complex vitamins capsule Take 1 capsule by mouth daily. 30 capsule 0   dronabinol  (MARINOL ) 2.5 MG capsule Take 2 capsules (5 mg total) by mouth 3 (three) times daily before meals. 90 capsule 1   ferrous sulfate  325 (65 FE) MG tablet Take 1 tablet (325 mg total) by mouth daily with breakfast. 90 tablet 0   gabapentin  (NEURONTIN ) 300 MG capsule Take 1 capsule (300 mg total) by mouth 2 (two) times daily. Take 1 cap at bed time only for 1-2 weeks first before you take twice daily 60 capsule 0   lidocaine -prilocaine  (EMLA ) cream Apply to affected area once (Patient taking differently: Apply 1 Application topically as needed (for port access).) 30 g 3   LORazepam  (ATIVAN ) 0.5 MG tablet Take 1 tablet (0.5 mg total) by mouth every 8 (eight) hours as needed for anxiety. 45 tablet 0   magic mouthwash (nystatin , lidocaine , diphenhydrAMINE , alum & mag hydroxide) suspension Swish and swallow 5 mLs by mouth 4 (four) times daily as needed for mouth pain. 140 mL 1   mirtazapine  (REMERON ) 7.5 MG tablet Take 1 tablet (7.5 mg total) by mouth at bedtime. 30 tablet 0   ondansetron  (ZOFRAN ) 8 MG tablet Take 1 tablet (8 mg total) by mouth every 8 (eight) hours as needed for nausea or vomiting. Start on the third day  after chemotherapy. 30 tablet 1   oxyCODONE  (OXY IR/ROXICODONE ) 5 MG immediate release tablet Take 1 tablet (5 mg total) by mouth every 4 (four) hours as needed for severe pain (pain score 7-10). 60 tablet 0   pantoprazole  (PROTONIX ) 40 MG tablet Take 1 tablet (40 mg total) by mouth daily. 30 tablet 0   polyethylene glycol (MIRALAX ) 17 g packet Take 17 g by mouth 2 (two) times daily. 30 each 0   prochlorperazine  (COMPAZINE ) 10 MG tablet Take 1 tablet (10 mg total) by mouth every 6 (six) hours as needed for nausea or vomiting. 30 tablet 1   senna-docusate (SENNA S) 8.6-50 MG tablet Take 1 tablet by mouth daily. 60 tablet 3   No current  facility-administered medications for this visit.    PHYSICAL EXAMINATION: ECOG PERFORMANCE STATUS: 0 - Asymptomatic  There were no vitals filed for this visit. Wt Readings from Last 3 Encounters:  01/13/24 123 lb (55.8 kg)  01/05/24 122 lb 6.4 oz (55.5 kg)  12/31/23 121 lb 4.8 oz (55 kg)     GENERAL:alert, no distress and comfortable SKIN: skin color, texture, turgor are normal, no rashes or significant lesions EYES: normal, Conjunctiva are pink and non-injected, sclera clear Musculoskeletal:no cyanosis of digits and no clubbing  NEURO: alert & oriented x 3 with fluent speech, no focal motor/sensory deficits  Physical Exam    LABORATORY DATA:  I have reviewed the data as listed    Latest Ref Rng & Units 02/03/2024    9:03 AM 01/13/2024   10:29 AM 12/31/2023   11:05 AM  CBC  WBC 4.0 - 10.5 K/uL 6.1  9.0  7.2   Hemoglobin 12.0 - 15.0 g/dL 87.2  87.4  87.3   Hematocrit 36.0 - 46.0 % 36.2  34.4  35.9   Platelets 150 - 400 K/uL 270  259  214         Latest Ref Rng & Units 02/03/2024    9:03 AM 01/13/2024   10:29 AM 12/31/2023   11:05 AM  CMP  Glucose 70 - 99 mg/dL 873  895  885   BUN 6 - 20 mg/dL 13  12  12    Creatinine 0.44 - 1.00 mg/dL 9.23  9.27  9.31   Sodium 135 - 145 mmol/L 139  141  141   Potassium 3.5 - 5.1 mmol/L 3.6  3.6  3.9   Chloride 98 - 111 mmol/L 106  106  108   CO2 22 - 32 mmol/L 26  27  27    Calcium  8.9 - 10.3 mg/dL 9.3  9.5  9.5   Total Protein 6.5 - 8.1 g/dL 7.4  7.3  7.5   Total Bilirubin 0.0 - 1.2 mg/dL 0.5  0.4  0.4   Alkaline Phos 38 - 126 U/L 67  61  63   AST 15 - 41 U/L 15  15  17    ALT 0 - 44 U/L 9  10  10        RADIOGRAPHIC STUDIES: I have personally reviewed the radiological images as listed and agreed with the findings in the report. No results found.    No orders of the defined types were placed in this encounter.  All questions were answered. The patient knows to call the clinic with any problems, questions or concerns. No  barriers to learning was detected. The total time spent in the appointment was 30 minutes, including review of chart and various tests results, discussions about plan of care and  coordination of care plan     Onita Mattock, MD 02/10/2024

## 2024-02-10 NOTE — Progress Notes (Signed)
 Palliative Medicine [facility]  [phone]) [phone] [phone]) [phone]   Name: Carol Mack Date: 02/10/2024 MRN: [id number]  DOB: 02/02/1971  Patient Care Team: Carol Rosaline SQUIBB, NP as PCP - General (Internal Medicine) Carol Callander, MD as Consulting Physician (Hematology and Oncology) Carol Hila, MD as Consulting Physician (General Surgery) Carol Glendia BRAVO, MD as Consulting Physician (Gastroenterology) [name], Carol SAILOR, NP as Nurse Practitioner (Hospice and Palliative Medicine)    INTERVAL HISTORY: Carol Mack is a 53 y.o. female with oncologic medical history including metastatic colon cancer with liver involvement.  Palliative is seeing patient for symptom management and goals of care.   SOCIAL HISTORY:     reports that she has quit smoking. Her smoking use included cigarettes. She started smoking about 29 years ago. She has a 29.6 pack-year smoking history. She has been exposed to tobacco smoke. She has never used smokeless tobacco. She reports that she does not drink alcohol and does not use drugs.  ADVANCE DIRECTIVES:  None on file   CODE STATUS: Full code  PAST MEDICAL HISTORY: Past Medical History:  Diagnosis Date   Anemia    Asthma    Bartholin cyst    Chiari malformation type I (HCC)    Hernia, inguinal    Hernia, inguinal    Hypertension    ILD (interstitial lung disease) (HCC)     ALLERGIES:  is allergic to hydrocodone .  MEDICATIONS:  Current Outpatient Medications  Medication Sig Dispense Refill   albuterol  (VENTOLIN  HFA) 108 (90 Base) MCG/ACT inhaler Inhale 1-2 puffs into the lungs every 6 (six) hours as needed for shortness of breath. 2 each 1   amLODipine  (NORVASC ) 10 MG tablet Take 1 tablet (10 mg total) by mouth daily. 30 tablet 2   dronabinol  (MARINOL ) 5 MG capsule Take 1 capsule (5 mg total) by mouth 2 (two) times daily before lunch and supper. 60 capsule 2   ferrous sulfate  325 (65 FE) MG tablet Take  1 tablet (325 mg total) by mouth daily with breakfast. 90 tablet 0   lidocaine -prilocaine  (EMLA ) cream Apply to affected area once (Patient taking differently: Apply 1 Application topically as needed (for port access).) 30 g 3   LORazepam  (ATIVAN ) 0.5 MG tablet Take 1 tablet (0.5 mg total) by mouth every 8 (eight) hours as needed for anxiety. 30 tablet 0   oxyCODONE  (OXY IR/ROXICODONE ) 5 MG immediate release tablet Take 1 tablet (5 mg total) by mouth every 4 (four) hours as needed for severe pain (pain score 7-10). 60 tablet 0   No current facility-administered medications for this visit.    VITAL SIGNS: LMP  (LMP Unknown)  There were no vitals filed for this visit.  Estimated body mass index is 21.11 kg/m as calculated from the following:   Height as of 01/13/24: 5' 4 (1.626 m).   Weight as of 01/13/24: 123 lb (55.8 kg).   PERFORMANCE STATUS (ECOG) : 1 - Symptomatic but completely ambulatory  Physical Exam General: NAD Cardiovascular: regular rate and rhythm Pulmonary: normal breathing pattern Extremities: no edema, no joint deformities Skin: no rashes Neurological: AAO x3  IMPRESSION: Discussed the use of AI scribe software for clinical note transcription with the patient, who gave verbal consent to proceed.  History of Present Illness Carol Mack is a 53 year old female who presents to clinic for follow-up. No acute distress. States she is doing well overall. Denies concerns of nausea, vomiting, constipation, or diarrhea. Occasional fatigue. Trying to remain active.  Appetite fluctuates. Some  days are better than others.   Her weight has increased slightly from 127.3 pounds to approximately 132 pounds, and she reports improved appetite. She states her preference is for herbal treatments over traditional medications, citing a desire to avoid the side effects associated with pharmaceuticals.  She experience persistent numbness in their fingers and feet. Previous attempts to manage  these symptoms with gabapentin  were unsuccessful, leading her to discontinue its use. Oxycodone  is used as needed for pain management, particularly for back and leg pain, and provides some relief for neuropathy symptoms, especially at night. Carol Mack is currently on a 5 mg dose of oxycodone . No adjustments to regimen given pain well controlled.   All questions answered and support provided.  I discussed the importance of continued conversation with family and their medical providers regarding overall plan of care and treatment options, ensuring decisions are within the context of the patients values and GOCs. Assessment & Plan Peripheral neuropathy and Cancer Related Pain  Chronic peripheral neuropathy with numbness in fingers and feet. Gabapentin  was previously ineffective and she chose to self discontinue. Oxycodone  provides some relief for neuropathy and chronic pain, particularly at night. Education provided on use of medications specifically for neuropathic pain and discomfort. She prefer to avoid excessive medication and are interested in herbal alternatives. - Continue oxycodone  5 mg to be picked up at [facility]. - Discuss herbal alternatives and preference to avoid excessive medication.  Anxiety Anxiety significantly improved with lorazepam . - Continue lorazepam  two to three times daily.  Appetite improvement Appetite improving with weight increase. - Prescribe Marinol  2.5 mg for appetite improvement, three times daily with meals.   Follow-up Advised to return for follow-up care as needed. - Schedule follow-up in 4-6 weeks.  Patient expressed understanding and was in agreement with this plan. She also understands that She can call the clinic at any time with any questions, concerns, or complaints.   Any controlled substances utilized were prescribed in the context of palliative care. PDMP has been reviewed.   Visit consisted of counseling and education dealing with the  complex and emotionally intense issues of symptom management and palliative care in the setting of serious and potentially life-threatening illness.  Carol Borer, AGPCNP-BC  Palliative Medicine Team/[facility]

## 2024-02-11 ENCOUNTER — Other Ambulatory Visit: Payer: Self-pay

## 2024-02-11 DIAGNOSIS — C772 Secondary and unspecified malignant neoplasm of intra-abdominal lymph nodes: Secondary | ICD-10-CM

## 2024-02-11 NOTE — Progress Notes (Signed)
 As per Dr. Lanny, placed order in portal for Signatera successfully. Placed kit in lab to be drawn 03/09/2024

## 2024-02-12 ENCOUNTER — Telehealth: Payer: Self-pay

## 2024-02-12 ENCOUNTER — Other Ambulatory Visit: Payer: Self-pay

## 2024-02-12 NOTE — Telephone Encounter (Signed)
 Received telephone call from the patient requesting a doctor's note for her bank for financial assistance. Patient stated the note needs to state the timeframe that she was under Dr. Demetra care & getting treatment. Patient stated she wanted to make sure this note was detailed, stating that she was treated w/ chemo therapy. Let patient know that I would forward her message request to the provider and follow-up accordingly. Patient voiced understanding. Research scientist (physical sciences))

## 2024-02-13 ENCOUNTER — Inpatient Hospital Stay: Attending: Hematology | Admitting: Licensed Clinical Social Worker

## 2024-02-13 ENCOUNTER — Other Ambulatory Visit: Payer: Self-pay

## 2024-02-13 DIAGNOSIS — C772 Secondary and unspecified malignant neoplasm of intra-abdominal lymph nodes: Secondary | ICD-10-CM | POA: Insufficient documentation

## 2024-02-13 DIAGNOSIS — C189 Malignant neoplasm of colon, unspecified: Secondary | ICD-10-CM

## 2024-02-13 DIAGNOSIS — C186 Malignant neoplasm of descending colon: Secondary | ICD-10-CM | POA: Insufficient documentation

## 2024-02-13 NOTE — Progress Notes (Signed)
 [facility] CSW Progress Note  Clinical Child psychotherapist contacted patient by phone to follow-up on financial concerns.    Interventions: Pt received a call from pt requesting a letter be drafted confirming she has been under the care of Dr. Lanny since December 2024 to be given to her bank.  Per pt she has an Brewing technologist to cover the loan with the bank, but will need confirmation from the cancer center of treatment to be able to use it.  Letter drafted pending Dr. Demetra signature and will be given to pt.        Follow Up Plan:  Patient will contact CSW with any support or resource needs    Carol JONELLE Manna, LCSW Clinical Social Worker [facility]    Patient is participating in a Managed Medicaid Plan:  Yes

## 2024-02-23 ENCOUNTER — Other Ambulatory Visit: Payer: Self-pay

## 2024-03-01 ENCOUNTER — Other Ambulatory Visit: Payer: Self-pay

## 2024-03-09 ENCOUNTER — Other Ambulatory Visit: Payer: Self-pay

## 2024-03-09 ENCOUNTER — Inpatient Hospital Stay: Admitting: Nurse Practitioner

## 2024-03-09 ENCOUNTER — Encounter: Payer: Self-pay | Admitting: Nurse Practitioner

## 2024-03-09 ENCOUNTER — Other Ambulatory Visit (HOSPITAL_COMMUNITY): Payer: Self-pay

## 2024-03-09 ENCOUNTER — Inpatient Hospital Stay

## 2024-03-09 DIAGNOSIS — Z95828 Presence of other vascular implants and grafts: Secondary | ICD-10-CM

## 2024-03-09 DIAGNOSIS — C189 Malignant neoplasm of colon, unspecified: Secondary | ICD-10-CM

## 2024-03-09 DIAGNOSIS — G893 Neoplasm related pain (acute) (chronic): Secondary | ICD-10-CM

## 2024-03-09 DIAGNOSIS — F419 Anxiety disorder, unspecified: Secondary | ICD-10-CM

## 2024-03-09 DIAGNOSIS — C772 Secondary and unspecified malignant neoplasm of intra-abdominal lymph nodes: Secondary | ICD-10-CM | POA: Diagnosis not present

## 2024-03-09 DIAGNOSIS — G4709 Other insomnia: Secondary | ICD-10-CM | POA: Diagnosis not present

## 2024-03-09 DIAGNOSIS — Z515 Encounter for palliative care: Secondary | ICD-10-CM | POA: Diagnosis not present

## 2024-03-09 DIAGNOSIS — C186 Malignant neoplasm of descending colon: Secondary | ICD-10-CM | POA: Diagnosis present

## 2024-03-09 LAB — CBC WITH DIFFERENTIAL/PLATELET
Abs Immature Granulocytes: 0.01 K/uL (ref 0.00–0.07)
Basophils Absolute: 0.1 K/uL (ref 0.0–0.1)
Basophils Relative: 1 %
Eosinophils Absolute: 0.1 K/uL (ref 0.0–0.5)
Eosinophils Relative: 2 %
HCT: 38.8 % (ref 36.0–46.0)
Hemoglobin: 13.5 g/dL (ref 12.0–15.0)
Immature Granulocytes: 0 %
Lymphocytes Relative: 37 %
Lymphs Abs: 2.2 K/uL (ref 0.7–4.0)
MCH: 25 pg — ABNORMAL LOW (ref 26.0–34.0)
MCHC: 34.8 g/dL (ref 30.0–36.0)
MCV: 71.7 fL — ABNORMAL LOW (ref 80.0–100.0)
Monocytes Absolute: 0.5 K/uL (ref 0.1–1.0)
Monocytes Relative: 8 %
Neutro Abs: 3 K/uL (ref 1.7–7.7)
Neutrophils Relative %: 52 %
Platelets: 251 K/uL (ref 150–400)
RBC: 5.41 MIL/uL — ABNORMAL HIGH (ref 3.87–5.11)
RDW: 17.4 % — ABNORMAL HIGH (ref 11.5–15.5)
WBC: 5.9 K/uL (ref 4.0–10.5)
nRBC: 0 % (ref 0.0–0.2)

## 2024-03-09 LAB — COMPREHENSIVE METABOLIC PANEL WITH GFR
ALT: 9 U/L (ref 0–44)
AST: 14 U/L — ABNORMAL LOW (ref 15–41)
Albumin: 4 g/dL (ref 3.5–5.0)
Alkaline Phosphatase: 66 U/L (ref 38–126)
Anion gap: 9 (ref 5–15)
BUN: 10 mg/dL (ref 6–20)
CO2: 25 mmol/L (ref 22–32)
Calcium: 9.3 mg/dL (ref 8.9–10.3)
Chloride: 107 mmol/L (ref 98–111)
Creatinine, Ser: 0.71 mg/dL (ref 0.44–1.00)
GFR, Estimated: >60 mL/min (ref >60)
Glucose, Bld: 138 mg/dL — ABNORMAL HIGH (ref 70–99)
Potassium: 3.4 mmol/L — ABNORMAL LOW (ref 3.5–5.1)
Sodium: 141 mmol/L (ref 135–145)
Total Bilirubin: 0.3 mg/dL (ref 0.0–1.2)
Total Protein: 7.2 g/dL (ref 6.5–8.1)

## 2024-03-09 LAB — GENETIC SCREENING ORDER

## 2024-03-09 LAB — CEA (ACCESS): CEA (CHCC): 8.6 ng/mL — ABNORMAL HIGH (ref 0.00–5.00)

## 2024-03-09 MED ORDER — OXYCODONE HCL 5 MG PO TABS: 5.0000 mg | ORAL_TABLET | ORAL | 0 refills | Status: DC | PRN

## 2024-03-09 MED ORDER — LORAZEPAM 0.5 MG PO TABS: 0.5000 mg | ORAL_TABLET | Freq: Three times a day (TID) | ORAL | 0 refills | Status: DC | PRN

## 2024-03-09 MED ORDER — SODIUM CHLORIDE 0.9% FLUSH
10.0000 mL | Freq: Once | INTRAVENOUS | Status: AC
  Administered 2024-03-09: 10 mL

## 2024-03-09 NOTE — Progress Notes (Signed)
 Patient presented to the clinic for palliative follow up appointment. Pt declined to have her vital signs taken at this time. NP made aware.

## 2024-03-10 ENCOUNTER — Other Ambulatory Visit (HOSPITAL_COMMUNITY): Payer: Self-pay

## 2024-03-11 ENCOUNTER — Ambulatory Visit: Payer: Self-pay | Admitting: Nurse Practitioner

## 2024-03-16 ENCOUNTER — Encounter: Payer: Self-pay | Admitting: Hematology

## 2024-03-16 NOTE — Progress Notes (Signed)
 Palliative Medicine [facility]  Telephone:(336) [phone] Fax:(336) [phone]   Name: Carol Mack Date: 03/16/2024 MRN: [id number]  DOB: 1971/03/03  Patient Care Team: Celestia Rosaline SQUIBB, NP as PCP - General (Internal Medicine) Lanny Callander, MD as Consulting Physician (Hematology and Oncology) Debby Hila, MD as Consulting Physician (General Surgery) Stacia Glendia BRAVO, MD as Consulting Physician (Gastroenterology) [name], Fannie SAILOR, NP as Nurse Practitioner (Hospice and Palliative Medicine)    INTERVAL HISTORY: Carol Mack is a 53 y.o. female with oncologic medical history including metastatic colon cancer with liver involvement.  Palliative is seeing patient for symptom management and goals of care.   SOCIAL HISTORY:     reports that she has quit smoking. Her smoking use included cigarettes. She started smoking about 29 years ago. She has a 29.7 pack-year smoking history. She has been exposed to tobacco smoke. She has never used smokeless tobacco. She reports that she does not drink alcohol and does not use drugs.  ADVANCE DIRECTIVES:  None on file   CODE STATUS: Full code  PAST MEDICAL HISTORY: Past Medical History:  Diagnosis Date   Anemia    Asthma    Bartholin cyst    Chiari malformation type I (HCC)    Hernia, inguinal    Hernia, inguinal    Hypertension    ILD (interstitial lung disease) (HCC)     ALLERGIES:  is allergic to hydrocodone .  MEDICATIONS:  Current Outpatient Medications  Medication Sig Dispense Refill   albuterol  (VENTOLIN  HFA) 108 (90 Base) MCG/ACT inhaler Inhale 1-2 puffs into the lungs every 6 (six) hours as needed for shortness of breath. 2 each 1   amLODipine  (NORVASC ) 10 MG tablet Take 1 tablet (10 mg total) by mouth daily. 30 tablet 2   dronabinol  (MARINOL ) 5 MG capsule Take 1 capsule (5 mg total) by mouth 2 (two) times daily before lunch and supper. 60 capsule 2   ferrous sulfate  325 (65 FE) MG tablet Take 1  tablet (325 mg total) by mouth daily with breakfast. 90 tablet 0   lidocaine -prilocaine  (EMLA ) cream Apply to affected area once (Patient taking differently: Apply 1 Application topically as needed (for port access).) 30 g 3   LORazepam  (ATIVAN ) 0.5 MG tablet Take 1 tablet (0.5 mg total) by mouth every 8 (eight) hours as needed for anxiety. 30 tablet 0   oxyCODONE  (OXY IR/ROXICODONE ) 5 MG immediate release tablet Take 1 tablet (5 mg total) by mouth every 4 (four) hours as needed for severe pain (pain score 7-10). 60 tablet 0   No current facility-administered medications for this visit.    VITAL SIGNS: LMP  (LMP Unknown)  There were no vitals filed for this visit.  Estimated body mass index is 21.11 kg/m as calculated from the following:   Height as of 01/13/24: 5' 4 (1.626 m).   Weight as of 01/13/24: 123 lb (55.8 kg).   PERFORMANCE STATUS (ECOG) : 1 - Symptomatic but completely ambulatory  Physical Exam General: NAD Cardiovascular: regular rate and rhythm Pulmonary: normal breathing pattern Extremities: no edema, no joint deformities Skin: no rashes Neurological: AAO x3  IMPRESSION: Discussed the use of AI scribe software for clinical note transcription with the patient, who gave verbal consent to proceed.  History of Present Illness Regine Christian is a 53 year old female who presents to clinic for follow-up. No acute distress. States she is doing well overall. Denies concerns of nausea, vomiting, constipation, or diarrhea. Occasional fatigue. Trying to remain active.  Appetite fluctuates. Some  days are better than others. Her weight is stable at 123lbs.    She experience persistent numbness in their fingers and feet. Previous attempts to manage these symptoms with gabapentin  were unsuccessful, leading her to discontinue its use. Oxycodone  is used as needed for pain management, particularly for back and leg pain, and provides some relief for neuropathy symptoms, especially at night.  Lakely is currently on a 5 mg dose of oxycodone . No adjustments to regimen given pain well controlled.   All questions answered and support provided.  I discussed the importance of continued conversation with family and their medical providers regarding overall plan of care and treatment options, ensuring decisions are within the context of the patients values and GOCs. Assessment & Plan Peripheral neuropathy and Cancer Related Pain  Chronic peripheral neuropathy with numbness in fingers and feet. Gabapentin  was previously ineffective and she chose to self discontinue. Oxycodone  provides some relief for neuropathy and chronic pain, particularly at night. Education provided on use of medications specifically for neuropathic pain and discomfort. She prefer to avoid excessive medication and are interested in herbal alternatives. - Continue oxycodone  5 mg to be picked up at [facility]. - Discuss herbal alternatives and preference to avoid excessive medication.  Anxiety Anxiety significantly improved with lorazepam . - Continue lorazepam  two to three times daily.  Appetite improvement Appetite improving with weight increase. - Prescribe Marinol  2.5 mg for appetite improvement, three times daily with meals.   Follow-up Advised to return for follow-up care as needed. - Schedule follow-up in 4-6 weeks.  Patient expressed understanding and was in agreement with this plan. She also understands that She can call the clinic at any time with any questions, concerns, or complaints.   Any controlled substances utilized were prescribed in the context of palliative care. PDMP has been reviewed.   Visit consisted of counseling and education dealing with the complex and emotionally intense issues of symptom management and palliative care in the setting of serious and potentially life-threatening illness.  Levon Borer, AGPCNP-BC  Palliative Medicine Team/[facility]

## 2024-03-19 ENCOUNTER — Other Ambulatory Visit: Payer: Self-pay

## 2024-03-19 ENCOUNTER — Other Ambulatory Visit (HOSPITAL_COMMUNITY): Payer: Self-pay

## 2024-03-21 ENCOUNTER — Other Ambulatory Visit (HOSPITAL_COMMUNITY): Payer: Self-pay

## 2024-03-24 ENCOUNTER — Ambulatory Visit (INDEPENDENT_AMBULATORY_CARE_PROVIDER_SITE_OTHER): Admitting: Primary Care

## 2024-03-24 ENCOUNTER — Encounter (HOSPITAL_COMMUNITY): Payer: Self-pay | Admitting: Hematology

## 2024-03-24 DIAGNOSIS — Z111 Encounter for screening for respiratory tuberculosis: Secondary | ICD-10-CM | POA: Diagnosis not present

## 2024-03-24 LAB — SIGNATERA
SIGNATERA MTM READOUT: 3.38 MTM/ml — AB
SIGNATERA TEST RESULT: POSITIVE — AB

## 2024-03-24 NOTE — Progress Notes (Signed)
 Pt is here for TB skin test  Advised pt to return in 48-72 hours for read, pt verbalized understanding

## 2024-03-26 ENCOUNTER — Ambulatory Visit (INDEPENDENT_AMBULATORY_CARE_PROVIDER_SITE_OTHER)

## 2024-03-26 ENCOUNTER — Telehealth (INDEPENDENT_AMBULATORY_CARE_PROVIDER_SITE_OTHER): Payer: Self-pay | Admitting: *Deleted

## 2024-03-26 LAB — TB SKIN TEST
Induration: 0 mm
TB Skin Test: NEGATIVE

## 2024-03-26 NOTE — Telephone Encounter (Signed)
 My Chart message sent

## 2024-03-26 NOTE — Telephone Encounter (Signed)
 Copied from CRM #[id number]. Topic: Clinical - Lab/Test Results >> Mar 26, 2024  2:00 PM Pinkey ORN wrote: Reason for CRM: Results

## 2024-03-26 NOTE — Progress Notes (Unsigned)
 Patient came in for TB reading, I felt something on injection site and pulled the Provider out the room to go check over it , PCP stated that it was negative and letter was produced in [place]

## 2024-04-02 ENCOUNTER — Telehealth (INDEPENDENT_AMBULATORY_CARE_PROVIDER_SITE_OTHER): Payer: Self-pay | Admitting: Primary Care

## 2024-04-02 NOTE — Telephone Encounter (Signed)
 1st attempt contacted pt left vm to esch appt pcp not in the office til oct 11th

## 2024-04-06 ENCOUNTER — Ambulatory Visit (INDEPENDENT_AMBULATORY_CARE_PROVIDER_SITE_OTHER): Admitting: Primary Care

## 2024-04-06 NOTE — Assessment & Plan Note (Signed)
-  [id number] with mesentery node and possible liver metastasis, G2, MMR proficient, KRAS G12D mutation (+) -Patient initially presented with abdominal pain to the emergency room, underwent a colonoscopy in October 2024 which showed a large mass in the splenic lecture of left side colon. -She underwent left hemicolectomy on June 26, 2023.  Large mesenteric mass was also biopsied which confirmed metastatic adenocarcinoma. -Staging CT scan and a liver MRI showed a suspicious 0.8 cm mass in the left lobe of liver, concerning for metastasis.  No other evidence of distant metastatic disease. Liver biopsy on 08/27/2023 was benign.  -I discussed her metastatic disease and the probable incurable nature of her disease. -I recommend systemic treatment with chemotherapy FOLFOX, she started on 08/06/23. -NGS [facility] showed KRAS G12D and PIK3CA mutations, not a candidate for targeted therapy  -Her tumor was tested of for MMR which were normal, (+) KRAS mutation, not a candidate for targeted or immunotherapy. Beva added from cycle 2, she completed 12 cycles chemo on 01/13/2024 -CT 10/20/2023 was negative for residual disease. Abdominal MRI 02/05/2024 was negative for liver mets or other residual disease  - Will continue cancer surveillance with imaging and ctDNA. Signatera was positive in Sep 2025, plan to get a PET

## 2024-04-07 ENCOUNTER — Inpatient Hospital Stay: Attending: Hematology

## 2024-04-07 ENCOUNTER — Ambulatory Visit (HOSPITAL_BASED_OUTPATIENT_CLINIC_OR_DEPARTMENT_OTHER): Admitting: Hematology

## 2024-04-07 ENCOUNTER — Other Ambulatory Visit (HOSPITAL_COMMUNITY): Payer: Self-pay

## 2024-04-07 VITALS — BP 114/70 | HR 88 | Temp 97.8°F | Resp 17 | Ht 64.0 in | Wt 132.2 lb

## 2024-04-07 DIAGNOSIS — F419 Anxiety disorder, unspecified: Secondary | ICD-10-CM | POA: Diagnosis not present

## 2024-04-07 DIAGNOSIS — Z79899 Other long term (current) drug therapy: Secondary | ICD-10-CM | POA: Insufficient documentation

## 2024-04-07 DIAGNOSIS — G4709 Other insomnia: Secondary | ICD-10-CM | POA: Diagnosis not present

## 2024-04-07 DIAGNOSIS — D649 Anemia, unspecified: Secondary | ICD-10-CM | POA: Diagnosis not present

## 2024-04-07 DIAGNOSIS — Z515 Encounter for palliative care: Secondary | ICD-10-CM

## 2024-04-07 DIAGNOSIS — C189 Malignant neoplasm of colon, unspecified: Secondary | ICD-10-CM | POA: Diagnosis not present

## 2024-04-07 DIAGNOSIS — C186 Malignant neoplasm of descending colon: Secondary | ICD-10-CM | POA: Diagnosis present

## 2024-04-07 DIAGNOSIS — C772 Secondary and unspecified malignant neoplasm of intra-abdominal lymph nodes: Secondary | ICD-10-CM | POA: Diagnosis present

## 2024-04-07 DIAGNOSIS — Z9221 Personal history of antineoplastic chemotherapy: Secondary | ICD-10-CM | POA: Diagnosis not present

## 2024-04-07 DIAGNOSIS — G893 Neoplasm related pain (acute) (chronic): Secondary | ICD-10-CM

## 2024-04-07 LAB — COMPREHENSIVE METABOLIC PANEL WITH GFR
ALT: 9 U/L (ref 0–44)
AST: 13 U/L — ABNORMAL LOW (ref 15–41)
Albumin: 3.9 g/dL (ref 3.5–5.0)
Alkaline Phosphatase: 65 U/L (ref 38–126)
Anion gap: 8 (ref 5–15)
BUN: 10 mg/dL (ref 6–20)
CO2: 25 mmol/L (ref 22–32)
Calcium: 9.1 mg/dL (ref 8.9–10.3)
Chloride: 106 mmol/L (ref 98–111)
Creatinine, Ser: 0.72 mg/dL (ref 0.44–1.00)
GFR, Estimated: >60 mL/min (ref >60)
Glucose, Bld: 127 mg/dL — ABNORMAL HIGH (ref 70–99)
Potassium: 3.2 mmol/L — ABNORMAL LOW (ref 3.5–5.1)
Sodium: 139 mmol/L (ref 135–145)
Total Bilirubin: 0.3 mg/dL (ref 0.0–1.2)
Total Protein: 7.3 g/dL (ref 6.5–8.1)

## 2024-04-07 LAB — CBC WITH DIFFERENTIAL/PLATELET
Abs Immature Granulocytes: 0.01 K/uL (ref 0.00–0.07)
Basophils Absolute: 0 K/uL (ref 0.0–0.1)
Basophils Relative: 1 %
Eosinophils Absolute: 0.1 K/uL (ref 0.0–0.5)
Eosinophils Relative: 1 %
HCT: 39.7 % (ref 36.0–46.0)
Hemoglobin: 14 g/dL (ref 12.0–15.0)
Immature Granulocytes: 0 %
Lymphocytes Relative: 36 %
Lymphs Abs: 2.6 K/uL (ref 0.7–4.0)
MCH: 25 pg — ABNORMAL LOW (ref 26.0–34.0)
MCHC: 35.3 g/dL (ref 30.0–36.0)
MCV: 70.9 fL — ABNORMAL LOW (ref 80.0–100.0)
Monocytes Absolute: 0.7 K/uL (ref 0.1–1.0)
Monocytes Relative: 10 %
Neutro Abs: 3.8 K/uL (ref 1.7–7.7)
Neutrophils Relative %: 52 %
Platelets: 280 K/uL (ref 150–400)
RBC: 5.6 MIL/uL — ABNORMAL HIGH (ref 3.87–5.11)
RDW: 15.5 % (ref 11.5–15.5)
WBC: 7.2 K/uL (ref 4.0–10.5)
nRBC: 0 % (ref 0.0–0.2)

## 2024-04-07 LAB — FERRITIN: Ferritin: 26 ng/mL (ref 11–307)

## 2024-04-07 MED ORDER — LORAZEPAM 0.5 MG PO TABS
0.5000 mg | ORAL_TABLET | Freq: Three times a day (TID) | ORAL | 0 refills | Status: DC | PRN
  Filled 2024-04-07: qty 30, 10d supply, fill #0

## 2024-04-07 MED ORDER — OXYCODONE HCL 5 MG PO TABS
5.0000 mg | ORAL_TABLET | ORAL | 0 refills | Status: DC | PRN
  Filled 2024-04-07: qty 60, 10d supply, fill #0

## 2024-04-07 NOTE — Progress Notes (Addendum)
 [facility]   Telephone:(336) [phone] Fax:(336) [phone]   Clinic Follow up Note   Patient Care Team: Celestia Rosaline SQUIBB, NP as PCP - General (Internal Medicine) Lanny Callander, MD as Consulting Physician (Hematology and Oncology) Debby Hila, MD as Consulting Physician (General Surgery) Stacia Glendia BRAVO, MD as Consulting Physician (Gastroenterology) [name], Fannie SAILOR, NP as Nurse Practitioner [facility] and Palliative Medicine)  Date of Service:  04/07/2024  CHIEF COMPLAINT: f/u of colon cancer  CURRENT THERAPY:  Cancer surveillance  Oncology History   Colon cancer metastasized to mesenteric lymph nodes (HCC) -[id number] with mesentery node and possible liver metastasis, G2, MMR proficient, KRAS G12D mutation (+) -Patient initially presented with abdominal pain to the emergency room, underwent a colonoscopy in October 2024 which showed a large mass in the splenic lecture of left side colon. -She underwent left hemicolectomy on June 26, 2023.  Large mesenteric mass was also biopsied which confirmed metastatic adenocarcinoma. -Staging CT scan and a liver MRI showed a suspicious 0.8 cm mass in the left lobe of liver, concerning for metastasis.  No other evidence of distant metastatic disease. Liver biopsy on 08/27/2023 was benign.  -I discussed her metastatic disease and the probable incurable nature of her disease. -I recommend systemic treatment with chemotherapy FOLFOX, she started on 08/06/23. -NGS [facility] showed KRAS G12D and PIK3CA mutations, not a candidate for targeted therapy  -Her tumor was tested of for MMR which were normal, (+) KRAS mutation, not a candidate for targeted or immunotherapy. Beva added from cycle 2, she completed 12 cycles chemo on 01/13/2024 -CT 10/20/2023 was negative for residual disease. Abdominal MRI 02/05/2024 was negative for liver mets or other residual disease  - Will continue cancer surveillance with imaging and ctDNA. Signatera  was positive in Sep 2025, plan to get a PET   Assessment & Plan Colon cancer, high risk for recurrence High risk for recurrence indicated by a positive Signatera blood test detecting circulating tumor DNA. Recent liver MRI in July, but further investigation is warranted due to the positive Signatera result. - Order whole body PET scan to assess for recurrence of colon cancer - Schedule follow-up phone visit after PET scan results  Chemotherapy-induced peripheral neuropathy Persistent numbness and tingling in feet and fingers affecting anxiety levels, but she remains functional without falls. Recovery can be prolonged.  Chronic pain Managed with oxycodone , taking two tablets on average. - Prescribe oxycodone  to be picked up at [facility]  Generalized anxiety disorder Anxiety exacerbated by neuropathy symptoms. Managed with lorazepam  and cannabis, which aids relaxation and appetite stimulation. Cannabis use is deemed safe for her current condition despite concerns about cancer recurrence. - Prescribe lorazepam  to be picked up at [facility]  Possible abdominal hernia Intermittent bloating and pain in the abdominal area, suspected hernia. Symptoms are not severe or tender upon examination.   Plan - Labs reviewed, her Signatera from March 24, 2024 was positive. - Will obtain a PET scan in the next few weeks for further evaluation - Phone visit after PET scan.  SUMMARY OF ONCOLOGIC HISTORY: Oncology History  Colon cancer metastasized to mesenteric lymph nodes (HCC)  06/26/2023 Cancer Staging   Staging form: Colon and Rectum, AJCC 8th Edition - Pathologic stage from 06/26/2023: Stage IVC (pT4a, pN1b, pM1c) - Signed by Lanny Callander, MD on 07/25/2023 Residual tumor (R): R1 - Microscopic   07/25/2023 Initial Diagnosis   Colon cancer metastasized to mesenteric lymph nodes (HCC)   08/06/2023 -  Chemotherapy  Patient is on Treatment Plan : COLORECTAL FOLFOX +  Bevacizumab  q14d        Discussed the use of AI scribe software for clinical note transcription with the patient, who gave verbal consent to proceed.  History of Present Illness Carol Mack is a 53 year old female with colon cancer who presents for follow-up.  She experiences occasional fatigue but feels better since completing chemotherapy. Persistent numbness and tingling in her legs, feet, and fingers cause anxiety, for which she takes neuropathy pills from a health store. She uses marijuana to improve appetite and mood, with improved appetite but stable weight at 132 pounds. Occasional stomach pain with bloating and intermittent pain is noted, with a possible hernia mentioned. Current medications include oxycodone  twice daily and lorazepam  once daily for anxiety. No recent falls, cough, or shortness of breath.     All other systems were reviewed with the patient and are negative.  MEDICAL HISTORY:  Past Medical History:  Diagnosis Date   Anemia    Asthma    Bartholin cyst    Chiari malformation type I (HCC)    Hernia, inguinal    Hernia, inguinal    Hypertension    ILD (interstitial lung disease) (HCC)     SURGICAL HISTORY: Past Surgical History:  Procedure Laterality Date   fibroid freezing     unsure   IR GENERIC HISTORICAL  07/27/2014   IR RADIOLOGIST EVAL & MGMT 07/27/2014 Juliene Balder, MD [facility] INTERV RAD   IR PORT REPAIR CENTRAL VENOUS ACCESS DEVICE  10/06/2023   PORTACATH PLACEMENT N/A 07/31/2023   Procedure: PLACEMENT OF PORT A CATHETER UNDER ULTRASOUND AND FLUOROSCOPIC GUIDANCE;  Surgeon: Sheldon Standing, MD;  Location: [facility];  Service: General;  Laterality: N/A;  GEN WITH ERAS 1.5 HOURS TOTAL   ROBOTIC ASSISTED TOTAL HYSTERECTOMY N/A 09/15/2018   Procedure: XI ROBOTIC ASSISTED TOTAL HYSTERECTOMY WITH SALPINGECTOMY;  Surgeon: Corene Coy, MD;  Location: [facility];  Service: Gynecology;  Laterality: N/A;    I have reviewed the social history  and family history with the patient and they are unchanged from previous note.  ALLERGIES:  is allergic to hydrocodone .  MEDICATIONS:  Current Outpatient Medications  Medication Sig Dispense Refill   albuterol  (VENTOLIN  HFA) 108 (90 Base) MCG/ACT inhaler Inhale 1-2 puffs into the lungs every 6 (six) hours as needed for shortness of breath. 2 each 1   amLODipine  (NORVASC ) 10 MG tablet Take 1 tablet (10 mg total) by mouth daily. 30 tablet 2   dronabinol  (MARINOL ) 5 MG capsule Take 1 capsule (5 mg total) by mouth 2 (two) times daily before lunch and supper. 60 capsule 2   ferrous sulfate  325 (65 FE) MG tablet Take 1 tablet (325 mg total) by mouth daily with breakfast. 90 tablet 0   lidocaine -prilocaine  (EMLA ) cream Apply to affected area once (Patient taking differently: Apply 1 Application topically as needed (for port access).) 30 g 3   LORazepam  (ATIVAN ) 0.5 MG tablet Take 1 tablet (0.5 mg total) by mouth every 8 (eight) hours as needed for anxiety. 30 tablet 0   oxyCODONE  (OXY IR/ROXICODONE ) 5 MG immediate release tablet Take 1 tablet (5 mg total) by mouth every 4 (four) hours as needed for severe pain (pain score 7-10). 60 tablet 0   No current facility-administered medications for this visit.    PHYSICAL EXAMINATION: ECOG PERFORMANCE STATUS: 1 - Symptomatic but completely ambulatory  Vitals:   04/07/24 0957  BP: 114/70  Pulse: 88  Resp:  17  Temp: 97.8 F (36.6 C)  SpO2: 96%   Wt Readings from Last 3 Encounters:  04/07/24 132 lb 3.2 oz (60 kg)  01/13/24 123 lb (55.8 kg)  01/05/24 122 lb 6.4 oz (55.5 kg)     GENERAL:alert, no distress and comfortable SKIN: skin color, texture, turgor are normal, no rashes or significant lesions EYES: normal, Conjunctiva are pink and non-injected, sclera clear NECK: supple, thyroid  normal size, non-tender, without nodularity LYMPH:  no palpable lymphadenopathy in the cervical, axillary  LUNGS: clear to auscultation and percussion with normal  breathing effort HEART: regular rate & rhythm and no murmurs and no lower extremity edema ABDOMEN:abdomen soft, non-tender and normal bowel sounds Musculoskeletal:no cyanosis of digits and no clubbing  NEURO: alert & oriented x 3 with fluent speech, no focal motor/sensory deficits  Physical Exam MEASUREMENTS: Weight- 132. ABDOMEN: Abdomen non-tender  LABORATORY DATA:  I have reviewed the data as listed    Latest Ref Rng & Units 04/07/2024    9:43 AM 03/09/2024   10:15 AM 02/03/2024    9:03 AM  CBC  WBC 4.0 - 10.5 K/uL 7.2  5.9  6.1   Hemoglobin 12.0 - 15.0 g/dL 85.9  86.4  87.2   Hematocrit 36.0 - 46.0 % 39.7  38.8  36.2   Platelets 150 - 400 K/uL 280  251  270         Latest Ref Rng & Units 04/07/2024    9:43 AM 03/09/2024   10:15 AM 02/03/2024    9:03 AM  CMP  Glucose 70 - 99 mg/dL 872  861  873   BUN 6 - 20 mg/dL 10  10  13    Creatinine 0.44 - 1.00 mg/dL 9.27  9.28  9.23   Sodium 135 - 145 mmol/L 139  141  139   Potassium 3.5 - 5.1 mmol/L 3.2  3.4  3.6   Chloride 98 - 111 mmol/L 106  107  106   CO2 22 - 32 mmol/L 25  25  26    Calcium  8.9 - 10.3 mg/dL 9.1  9.3  9.3   Total Protein 6.5 - 8.1 g/dL 7.3  7.2  7.4   Total Bilirubin 0.0 - 1.2 mg/dL 0.3  0.3  0.5   Alkaline Phos 38 - 126 U/L 65  66  67   AST 15 - 41 U/L 13  14  15    ALT 0 - 44 U/L 9  9  9        RADIOGRAPHIC STUDIES: I have personally reviewed the radiological images as listed and agreed with the findings in the report. No results found.    Orders Placed This Encounter  Procedures   NM PET Image Initial (PI) Skull Base To Thigh    Standing Status:   Future    Expected Date:   04/14/2024    Expiration Date:   04/07/2025    If indicated for the ordered procedure, I authorize the administration of a radiopharmaceutical per Radiology protocol:   Yes    Is the patient pregnant?:   No    Preferred imaging location?:   [facility]   All questions were answered. The patient knows to call the clinic with any  problems, questions or concerns. No barriers to learning was detected. The total time spent in the appointment was 30 minutes, including review of chart and various tests results, discussions about plan of care and coordination of care plan     Onita Mattock, MD 04/07/2024

## 2024-04-08 ENCOUNTER — Other Ambulatory Visit: Payer: Self-pay

## 2024-04-15 ENCOUNTER — Ambulatory Visit (HOSPITAL_COMMUNITY)

## 2024-04-19 ENCOUNTER — Ambulatory Visit (HOSPITAL_COMMUNITY)
Admission: RE | Admit: 2024-04-19 | Discharge: 2024-04-19 | Disposition: A | Discharge status: Home / Self Care | Source: Ambulatory Visit | Attending: Hematology | Admitting: Hematology

## 2024-04-19 DIAGNOSIS — C772 Secondary and unspecified malignant neoplasm of intra-abdominal lymph nodes: Secondary | ICD-10-CM | POA: Diagnosis present

## 2024-04-19 DIAGNOSIS — C189 Malignant neoplasm of colon, unspecified: Secondary | ICD-10-CM | POA: Insufficient documentation

## 2024-04-19 LAB — GLUCOSE, CAPILLARY: Glucose-Capillary: 110 mg/dL — ABNORMAL HIGH (ref 70–99)

## 2024-04-19 MED ORDER — FLUDEOXYGLUCOSE F - 18 (FDG) INJECTION
6.6000 | Freq: Once | INTRAVENOUS | Status: AC | PRN
  Administered 2024-04-19: 6.54 via INTRAVENOUS

## 2024-04-22 ENCOUNTER — Other Ambulatory Visit (HOSPITAL_COMMUNITY): Payer: Self-pay

## 2024-04-22 ENCOUNTER — Inpatient Hospital Stay: Attending: Hematology | Admitting: Nurse Practitioner

## 2024-04-22 ENCOUNTER — Inpatient Hospital Stay: Attending: Hematology | Admitting: Hematology

## 2024-04-22 ENCOUNTER — Other Ambulatory Visit: Payer: Self-pay | Admitting: Nurse Practitioner

## 2024-04-22 DIAGNOSIS — G893 Neoplasm related pain (acute) (chronic): Secondary | ICD-10-CM

## 2024-04-22 DIAGNOSIS — C186 Malignant neoplasm of descending colon: Secondary | ICD-10-CM | POA: Insufficient documentation

## 2024-04-22 DIAGNOSIS — Z515 Encounter for palliative care: Secondary | ICD-10-CM

## 2024-04-22 DIAGNOSIS — G4709 Other insomnia: Secondary | ICD-10-CM

## 2024-04-22 DIAGNOSIS — C189 Malignant neoplasm of colon, unspecified: Secondary | ICD-10-CM

## 2024-04-22 DIAGNOSIS — F419 Anxiety disorder, unspecified: Secondary | ICD-10-CM

## 2024-04-22 DIAGNOSIS — C772 Secondary and unspecified malignant neoplasm of intra-abdominal lymph nodes: Secondary | ICD-10-CM | POA: Diagnosis not present

## 2024-04-22 MED ORDER — LORAZEPAM 0.5 MG PO TABS
0.5000 mg | ORAL_TABLET | Freq: Three times a day (TID) | ORAL | 0 refills | Status: DC | PRN
  Filled 2024-04-22: qty 30, 10d supply, fill #0

## 2024-04-22 MED ORDER — OXYCODONE HCL 5 MG PO TABS
5.0000 mg | ORAL_TABLET | ORAL | 0 refills | Status: DC | PRN
  Filled 2024-04-22: qty 60, 10d supply, fill #0

## 2024-04-22 NOTE — Progress Notes (Signed)
 [facility]   Telephone:(336) [phone] Fax:(336) [phone]   Clinic Follow up Note   Patient Care Team: Celestia Rosaline SQUIBB, NP as PCP - General (Internal Medicine) Lanny Callander, MD as Consulting Physician (Hematology and Oncology) Debby Hila, MD as Consulting Physician (General Surgery) Stacia Glendia BRAVO, MD as Consulting Physician (Gastroenterology) [name], Fannie SAILOR, NP as Nurse Practitioner [facility] and Palliative Medicine) 04/22/2024  I connected with Carol Mack on 04/22/24 at  3:30 PM EDT by telephone and verified that I am speaking with the correct person using two identifiers.   I discussed the limitations, risks, security and privacy concerns of performing an evaluation and management service by telephone and the availability of in person appointments. I also discussed with the patient that there may be a patient responsible charge related to this service. The patient expressed understanding and agreed to proceed.   Patient's location: Work Provider's location:  Office    CHIEF COMPLAINT: Follow-up of PET scan findings   CURRENT THERAPY: Cancer surveillance  Oncology history Colon cancer metastasized to mesenteric lymph nodes (HCC) -[id number] with mesentery node and possible liver metastasis, G2, MMR proficient, KRAS G12D mutation (+) -Patient initially presented with abdominal pain to the emergency room, underwent a colonoscopy in October 2024 which showed a large mass in the splenic lecture of left side colon. -She underwent left hemicolectomy on June 26, 2023.  Large mesenteric mass was also biopsied which confirmed metastatic adenocarcinoma. -Staging CT scan and a liver MRI showed a suspicious 0.8 cm mass in the left lobe of liver, concerning for metastasis.  No other evidence of distant metastatic disease. Liver biopsy on 08/27/2023 was benign.  -I discussed her metastatic disease and the probable incurable nature of her disease. -I  recommend systemic treatment with chemotherapy FOLFOX, she started on 08/06/23. -NGS [facility] showed KRAS G12D and PIK3CA mutations, not a candidate for targeted therapy  -Her tumor was tested of for MMR which were normal, (+) KRAS mutation, not a candidate for targeted or immunotherapy. Beva added from cycle 2, she completed 12 cycles chemo on 01/13/2024 -CT 10/20/2023 was negative for residual disease. Abdominal MRI 02/05/2024 was negative for liver mets or other residual disease  - Will continue cancer surveillance with imaging and ctDNA. Signatera was positive in Sep 2025. PET showed Two foci of Intense metabolic activity at the mid transverse colonic anastomosis with associated subtle bowel wall thickening and adjacent nodule, suspicious for recurrent colon cancer.   Assessment & Plan Suspected recurrent colon cancer with possible mediastinal and abdominal lymph node metastases Suspicion of recurrent colon cancer in the transverse colon at the site of previous surgical incision, indicated by PET scan uptake. Concern for possible metastases to mediastinal and abdominal lymph nodes. PET scan findings are concerning but not definitive. Positive Signatera test last month increases suspicion of recurrence. Mediastinal lymph nodes are difficult to biopsy, plan is to monitor them. Surgical intervention depends on localization of disease. - Contact Dr. Stacia to arrange a colonoscopy to evaluate the transverse colon. - Review PET scan findings with Dr. Debby and discuss potential surgical options if cancer is confirmed and localized. -Will contact IR to see if they can biopsy her RP nodes - If no contact from Dr. London office by mid next week, instruct her to call back.     SUMMARY OF ONCOLOGIC HISTORY: Oncology History  Colon cancer metastasized to mesenteric lymph nodes (HCC)  06/26/2023 Cancer Staging   Staging form: Colon and Rectum, AJCC 8th Edition - Pathologic  stage from 06/26/2023:  Stage IVC (pT4a, pN1b, pM1c) - Signed by Lanny Callander, MD on 07/25/2023 Residual tumor (R): R1 - Microscopic   07/25/2023 Initial Diagnosis   Colon cancer metastasized to mesenteric lymph nodes (HCC)   08/06/2023 -  Chemotherapy   Patient is on Treatment Plan : COLORECTAL FOLFOX + Bevacizumab  q14d       Discussed the use of AI scribe software for clinical note transcription with the patient, who gave verbal consent to proceed.  History of Present Illness Carol Mack is a 53 year old female with colon cancer who presents for a review of her recent PET scan findings.  She has a history of colon cancer with prior surgery in the transverse colon. The recent PET scan shows uptake at the surgical incision site in the transverse colon. Her Signatera test was positive last month. The PET scan also revealed abnormal lymph nodes in the middle chest and the back of the stomach, with the lymph nodes in the chest being difficult to biopsy. She has no current stomach issues or pain.     REVIEW OF SYSTEMS:   Constitutional: Denies fevers, chills or abnormal weight loss Eyes: Denies blurriness of vision Ears, nose, mouth, throat, and face: Denies mucositis or sore throat Respiratory: Denies cough, dyspnea or wheezes Cardiovascular: Denies palpitation, chest discomfort or lower extremity swelling Gastrointestinal:  Denies nausea, heartburn or change in bowel habits Skin: Denies abnormal skin rashes Lymphatics: Denies new lymphadenopathy or easy bruising Neurological:Denies numbness, tingling or new weaknesses Behavioral/Psych: Mood is stable, no new changes  All other systems were reviewed with the patient and are negative.  MEDICAL HISTORY:  Past Medical History:  Diagnosis Date   Anemia    Asthma    Bartholin cyst    Chiari malformation type I (HCC)    Hernia, inguinal    Hernia, inguinal    Hypertension    ILD (interstitial lung disease) (HCC)     SURGICAL HISTORY: Past Surgical  History:  Procedure Laterality Date   fibroid freezing     unsure   IR GENERIC HISTORICAL  07/27/2014   IR RADIOLOGIST EVAL & MGMT 07/27/2014 Juliene Balder, MD [facility] INTERV RAD   IR PORT REPAIR CENTRAL VENOUS ACCESS DEVICE  10/06/2023   PORTACATH PLACEMENT N/A 07/31/2023   Procedure: PLACEMENT OF PORT A CATHETER UNDER ULTRASOUND AND FLUOROSCOPIC GUIDANCE;  Surgeon: Sheldon Standing, MD;  Location: [facility];  Service: General;  Laterality: N/A;  GEN WITH ERAS 1.5 HOURS TOTAL   ROBOTIC ASSISTED TOTAL HYSTERECTOMY N/A 09/15/2018   Procedure: XI ROBOTIC ASSISTED TOTAL HYSTERECTOMY WITH SALPINGECTOMY;  Surgeon: Corene Coy, MD;  Location: [facility];  Service: Gynecology;  Laterality: N/A;    I have reviewed the social history and family history with the patient and they are unchanged from previous note.  ALLERGIES:  is allergic to hydrocodone .  MEDICATIONS:  Current Outpatient Medications  Medication Sig Dispense Refill   albuterol  (VENTOLIN  HFA) 108 (90 Base) MCG/ACT inhaler Inhale 1-2 puffs into the lungs every 6 (six) hours as needed for shortness of breath. 2 each 1   amLODipine  (NORVASC ) 10 MG tablet Take 1 tablet (10 mg total) by mouth daily. 30 tablet 2   dronabinol  (MARINOL ) 5 MG capsule Take 1 capsule (5 mg total) by mouth 2 (two) times daily before lunch and supper. 60 capsule 2   ferrous sulfate  325 (65 FE) MG tablet Take 1 tablet (325 mg total) by mouth daily with breakfast. 90 tablet 0  lidocaine -prilocaine  (EMLA ) cream Apply to affected area once (Patient taking differently: Apply 1 Application topically as needed (for port access).) 30 g 3   LORazepam  (ATIVAN ) 0.5 MG tablet Take 1 tablet (0.5 mg total) by mouth every 8 (eight) hours as needed for anxiety. 30 tablet 0   oxyCODONE  (OXY IR/ROXICODONE ) 5 MG immediate release tablet Take 1 tablet (5 mg total) by mouth every 4 (four) hours as needed for severe pain (pain score 7-10). 60 tablet 0   No current  facility-administered medications for this visit.    PHYSICAL EXAMINATION: Not performed   LABORATORY DATA:  I have reviewed the data as listed    Latest Ref Rng & Units 04/07/2024    9:43 AM 03/09/2024   10:15 AM 02/03/2024    9:03 AM  CBC  WBC 4.0 - 10.5 K/uL 7.2  5.9  6.1   Hemoglobin 12.0 - 15.0 g/dL 85.9  86.4  87.2   Hematocrit 36.0 - 46.0 % 39.7  38.8  36.2   Platelets 150 - 400 K/uL 280  251  270         Latest Ref Rng & Units 04/07/2024    9:43 AM 03/09/2024   10:15 AM 02/03/2024    9:03 AM  CMP  Glucose 70 - 99 mg/dL 872  861  873   BUN 6 - 20 mg/dL 10  10  13    Creatinine 0.44 - 1.00 mg/dL 9.27  9.28  9.23   Sodium 135 - 145 mmol/L 139  141  139   Potassium 3.5 - 5.1 mmol/L 3.2  3.4  3.6   Chloride 98 - 111 mmol/L 106  107  106   CO2 22 - 32 mmol/L 25  25  26    Calcium  8.9 - 10.3 mg/dL 9.1  9.3  9.3   Total Protein 6.5 - 8.1 g/dL 7.3  7.2  7.4   Total Bilirubin 0.0 - 1.2 mg/dL 0.3  0.3  0.5   Alkaline Phos 38 - 126 U/L 65  66  67   AST 15 - 41 U/L 13  14  15    ALT 0 - 44 U/L 9  9  9        RADIOGRAPHIC STUDIES: I have personally reviewed the radiological images as listed and agreed with the findings in the report. No results found.     I discussed the assessment and treatment plan with the patient. The patient was provided an opportunity to ask questions and all were answered. The patient agreed with the plan and demonstrated an understanding of the instructions.   The patient was advised to call back or seek an in-person evaluation if the symptoms worsen or if the condition fails to improve as anticipated.  I provided 25 minutes of non face-to-face telephone visit time during this encounter, including review of chart and various tests results, discussions about plan of care and coordination of care plan.    Onita Mattock, MD 04/22/24

## 2024-04-22 NOTE — Assessment & Plan Note (Signed)
-  [id number] with mesentery node and possible liver metastasis, G2, MMR proficient, KRAS G12D mutation (+) -Patient initially presented with abdominal pain to the emergency room, underwent a colonoscopy in October 2024 which showed a large mass in the splenic lecture of left side colon. -She underwent left hemicolectomy on June 26, 2023.  Large mesenteric mass was also biopsied which confirmed metastatic adenocarcinoma. -Staging CT scan and a liver MRI showed a suspicious 0.8 cm mass in the left lobe of liver, concerning for metastasis.  No other evidence of distant metastatic disease. Liver biopsy on 08/27/2023 was benign.  -I discussed her metastatic disease and the probable incurable nature of her disease. -I recommend systemic treatment with chemotherapy FOLFOX, she started on 08/06/23. -NGS [facility] showed KRAS G12D and PIK3CA mutations, not a candidate for targeted therapy  -Her tumor was tested of for MMR which were normal, (+) KRAS mutation, not a candidate for targeted or immunotherapy. Beva added from cycle 2, she completed 12 cycles chemo on 01/13/2024 -CT 10/20/2023 was negative for residual disease. Abdominal MRI 02/05/2024 was negative for liver mets or other residual disease  - Will continue cancer surveillance with imaging and ctDNA. Signatera was positive in Sep 2025. PET showed Two foci of Intense metabolic activity at the mid transverse colonic anastomosis with associated subtle bowel wall thickening and adjacent nodule, suspicious for recurrent colon cancer.

## 2024-04-23 ENCOUNTER — Other Ambulatory Visit (HOSPITAL_COMMUNITY): Payer: Self-pay

## 2024-04-23 ENCOUNTER — Telehealth: Payer: Self-pay | Admitting: *Deleted

## 2024-04-23 NOTE — Telephone Encounter (Signed)
 Left message for patient to call back

## 2024-04-23 NOTE — Telephone Encounter (Signed)
-----   Message from Glendia FORBES Holt sent at 04/23/2024  6:18 AM EDT ----- Got it, thanks.  We will get her in soon.  Team,  Can you please contact the patient and get her scheduled for a colonoscopy within a month?  If nothing available within a month let me know and we can add her on.  Thanks ----- Message ----- From: Lanny Callander, MD Sent: 04/22/2024  10:14 PM EDT To: Juliene Balder, MD; Bernarda Ned, MD; Glendia FORBES Cu#  Sorry that I forgot to attach the pt, here she is . ----- Message ----- From: Holt Glendia FORBES, MD Sent: 04/22/2024   8:49 PM EDT To: Callander Lanny, MD  Sorry Callander,  Which patient is this? ----- Message ----- From: Lanny Callander, MD Sent: 04/22/2024   5:13 PM EDT To: Juliene Balder, MD; Bernarda Ned, MD; Glendia FORBES Cu#  Scott,  Her recent PET scan showed hypermetabolic nodule at the previous transverse colon anastomosis.  Could you get her in soon for colonoscopy and biopsy?  Adam, her PET scan also showed mildly hypermetabolic RP nodes and mediastinal nodes.  I you able to biopsy one of the RP nodes?  Thanks much  [employer]

## 2024-04-26 ENCOUNTER — Other Ambulatory Visit: Payer: Self-pay

## 2024-04-26 DIAGNOSIS — C189 Malignant neoplasm of colon, unspecified: Secondary | ICD-10-CM

## 2024-04-26 NOTE — Telephone Encounter (Signed)
 I have spoken to patient who has scheduled previsit on 05/04/24 at 330 pm and colonoscopy on 05/20/24 at 1 pm.

## 2024-04-26 NOTE — Telephone Encounter (Signed)
Patient returning call. Please advise, thank you

## 2024-04-26 NOTE — Telephone Encounter (Signed)
 Left message for patient to call back

## 2024-04-27 ENCOUNTER — Encounter (HOSPITAL_COMMUNITY): Payer: Self-pay

## 2024-04-27 NOTE — Progress Notes (Signed)
 Philip Cornet, MD  Michaelene Setter PROCEDURE / BIOPSY REVIEW Date: 04/27/24  Requested Biopsy site: Lymph node biopsy Reason for request: Wants to evaluate lymphadenopathy Imaging review: PET CT 04/19/24  Decision: Approved Imaging modality to perform: Ultrasound Schedule with: No sedation / Local anesthetic Schedule for: Any VIR  Additional comments: Dr. Lanny wants a lymph node biopsy, left axillary region looks most accessible.  Please contact me with questions, concerns, or if issue pertaining to this request arise.  Cornet JONELLE Philip, MD [facility]       Previous Messages    ----- Message ----- From: Chance Karam Sent: 04/27/2024   8:40 AM EDT To: Meesha Sek; Ir Procedure Requests Subject: US  AXILLARY NODE CORE BIOPSY LEFT              Procedure :US  AXILLARY NODE CORE BIOPSY LEFT   Reason: rule out cancer reccurance Dx: Colon cancer metastasized to mesenteric lymph nodes (HCC) [C18.9, C77.2 (ICD-10-CM)]    History : NM PET initial skull base to thigh , MR liver w/wo , CT abd pelvis w/  Provider : Lanny Callander, MD  Contact : [phone]

## 2024-04-28 ENCOUNTER — Ambulatory Visit (INDEPENDENT_AMBULATORY_CARE_PROVIDER_SITE_OTHER): Admitting: Primary Care

## 2024-04-29 ENCOUNTER — Telehealth: Payer: Self-pay

## 2024-04-29 NOTE — Telephone Encounter (Signed)
 Called patient due to IR trying to reach the patient to set up her appointment for her Biopsy and she is not answering the phone. They have tried to reach her for several days with no success. I also tried to call her and her sister today no answer I left a message to call us  back as soon as possible to get this appointment scheduled. IR stated they had to give away the app that was scheduled due to urgent needs. Will schedule when the patient does contact us .

## 2024-05-04 ENCOUNTER — Other Ambulatory Visit: Payer: Self-pay

## 2024-05-04 ENCOUNTER — Other Ambulatory Visit (HOSPITAL_COMMUNITY): Payer: Self-pay

## 2024-05-04 ENCOUNTER — Ambulatory Visit (AMBULATORY_SURGERY_CENTER)

## 2024-05-04 ENCOUNTER — Encounter: Payer: Self-pay | Admitting: Hematology

## 2024-05-04 VITALS — Ht 64.0 in | Wt 129.2 lb

## 2024-05-04 DIAGNOSIS — Z85038 Personal history of other malignant neoplasm of large intestine: Secondary | ICD-10-CM

## 2024-05-04 DIAGNOSIS — R948 Abnormal results of function studies of other organs and systems: Secondary | ICD-10-CM

## 2024-05-04 MED ORDER — PLENVU 140 G PO SOLR
1.0000 | Freq: Once | ORAL | 0 refills | Status: AC
  Filled 2024-05-04: qty 3, 1d supply, fill #0

## 2024-05-04 NOTE — Progress Notes (Signed)
 No egg or soy allergy known to patient  No issues known to pt with past sedation with any surgeries or procedures Patient denies ever being told they had issues or difficulty with intubation  No FH of Malignant Hyperthermia Pt is not on diet pills Pt is not on  home 02  Pt is not on blood thinners  Pt denies issues with constipation  No A fib or A flutter Have any cardiac testing pending-- no  LOA: independent  Prep: plenvu    Patient's chart reviewed by Norleen Schillings CNRA prior to previsit and patient appropriate for the [facility].  Previsit completed and red dot placed by patient's name on their procedure day (on provider's schedule).     PV completed with patient. Prep instructions sent via mychart and home address.

## 2024-05-06 ENCOUNTER — Other Ambulatory Visit (HOSPITAL_COMMUNITY): Payer: Self-pay

## 2024-05-06 ENCOUNTER — Telehealth: Payer: Self-pay | Admitting: Gastroenterology

## 2024-05-06 MED ORDER — PEG 3350-KCL-NA BICARB-NACL 420 G PO SOLR
4000.0000 mL | Freq: Once | ORAL | 0 refills | Status: AC
  Filled 2024-05-06: qty 4000, 1d supply, fill #0

## 2024-05-06 NOTE — Telephone Encounter (Signed)
 Inbound call from patient stating that she received a call from the pharmacy at the hospital informing her that she would have to pay 100 and something dollars for her prep medication. Patient is wanting to know if she can get a different prep medication that might be generic. Patient is requesting a call back. Please advise.

## 2024-05-06 NOTE — Telephone Encounter (Signed)
 RN returned call to patient who was requesting a different prep medication. RN told patient that she has sent a new order for Golytely  to her pharmacy and new instructions will be sent via My Chart as patient requested.  All questions were answered.

## 2024-05-11 ENCOUNTER — Other Ambulatory Visit: Payer: Self-pay | Admitting: Nurse Practitioner

## 2024-05-11 ENCOUNTER — Telehealth: Payer: Self-pay

## 2024-05-11 ENCOUNTER — Other Ambulatory Visit (HOSPITAL_COMMUNITY): Payer: Self-pay

## 2024-05-11 ENCOUNTER — Other Ambulatory Visit (INDEPENDENT_AMBULATORY_CARE_PROVIDER_SITE_OTHER): Payer: Self-pay | Admitting: Primary Care

## 2024-05-11 DIAGNOSIS — Z515 Encounter for palliative care: Secondary | ICD-10-CM

## 2024-05-11 DIAGNOSIS — C189 Malignant neoplasm of colon, unspecified: Secondary | ICD-10-CM

## 2024-05-11 DIAGNOSIS — G893 Neoplasm related pain (acute) (chronic): Secondary | ICD-10-CM

## 2024-05-11 MED ORDER — OXYCODONE HCL 5 MG PO TABS
5.0000 mg | ORAL_TABLET | ORAL | 0 refills | Status: DC | PRN
  Filled 2024-05-11: qty 60, 10d supply, fill #0

## 2024-05-11 NOTE — Telephone Encounter (Signed)
 Copied from CRM [id number]. Topic: Clinical - Medication Refill >> May 11, 2024 10:52 AM Tiffini S wrote: Medication: amLODipine  (NORVASC ) 10 MG tablet  Has the patient contacted their pharmacy? No (Agent: If no, request that the patient contact the pharmacy for the refill. If patient does not wish to contact the pharmacy document the reason why and proceed with request.) (Agent: If yes, when and what did the pharmacy advise?)  This is the patient's preferred pharmacy:  [facility] [address] [place] [address] Phone: [phone] Fax: [phone]  Is this the correct pharmacy for this prescription? Yes If no, delete pharmacy and type the correct one.   Has the prescription been filled recently? Yes  Is the patient out of the medication? Yes  Has the patient been seen for an appointment in the last year OR does the patient have an upcoming appointment? Yes  Can we respond through MyChart? Yes  Agent: Please be advised that Rx refills may take up to 3 business days. We ask that you follow-up with your pharmacy.

## 2024-05-11 NOTE — Telephone Encounter (Signed)
 Pt called and LVM requesting dental clearance for a procedure she's going to have.  Returned pt's call but was unable to reach pt.  LVM instructing pt to contact her Dental Office to have them to fax Dr. Demetra office the dental clearance form indicating what type of procedure the pt's having and a signature line for Dr. Lanny to sign indicating clearance or not.  Provided pt with Dr. Demetra fax number for the pt's Dental Office to fax the clearance.

## 2024-05-12 ENCOUNTER — Other Ambulatory Visit (HOSPITAL_COMMUNITY): Payer: Self-pay

## 2024-05-12 ENCOUNTER — Telehealth: Payer: Self-pay

## 2024-05-12 ENCOUNTER — Telehealth: Payer: Self-pay | Admitting: Hematology

## 2024-05-12 ENCOUNTER — Other Ambulatory Visit: Payer: Self-pay

## 2024-05-12 NOTE — Telephone Encounter (Signed)
 Dental clearance faxed signed & completed by Dr. Lanny.  Faxed dental clearance to Dr. Vicci Fila [phone]).  Fax confirmation received.

## 2024-05-12 NOTE — Telephone Encounter (Signed)
 Called pt and LVM regarding  coming appts.

## 2024-05-13 ENCOUNTER — Other Ambulatory Visit (HOSPITAL_COMMUNITY): Payer: Self-pay

## 2024-05-13 MED ORDER — IBUPROFEN 800 MG PO TABS
800.0000 mg | ORAL_TABLET | Freq: Four times a day (QID) | ORAL | 0 refills | Status: DC
  Filled 2024-05-13 (×2): qty 20, 5d supply, fill #0

## 2024-05-13 MED ORDER — AMOXICILLIN 500 MG PO CAPS
500.0000 mg | ORAL_CAPSULE | Freq: Three times a day (TID) | ORAL | 0 refills | Status: DC
  Filled 2024-05-13: qty 21, 7d supply, fill #0

## 2024-05-13 NOTE — Telephone Encounter (Signed)
 Requested medication (s) are due for refill today: Yes  Requested medication (s) are on the active medication list: Yes  Last refill:  12/03/23  Future visit scheduled: Yes  Notes to clinic:  Last filled by Dr. Onita Mattock    Requested Prescriptions  Pending Prescriptions Disp Refills   amLODipine  (NORVASC ) 10 MG tablet 30 tablet 2    Sig: Take 1 tablet (10 mg total) by mouth daily.     Cardiovascular: Calcium  Channel Blockers 2 Passed - 05/13/2024 10:52 AM      Passed - Last BP in normal range    BP Readings from Last 1 Encounters:  04/07/24 114/70         Passed - Last Heart Rate in normal range    Pulse Readings from Last 1 Encounters:  04/07/24 88         Passed - Valid encounter within last 6 months    Recent Outpatient Visits           4 months ago Annual physical exam   [facility] Celestia Rosaline SQUIBB, NP   10 months ago Generalized abdominal pain   [place] Celestia Rosaline SQUIBB, NP   1 year ago Essential hypertension   [facility] Celestia Rosaline SQUIBB, NP

## 2024-05-14 NOTE — Telephone Encounter (Signed)
 Would you be able to refill if possible

## 2024-05-17 ENCOUNTER — Encounter: Payer: Self-pay | Admitting: Hematology

## 2024-05-17 MED ORDER — AMLODIPINE BESYLATE 10 MG PO TABS
10.0000 mg | ORAL_TABLET | Freq: Every day | ORAL | 2 refills | Status: AC
  Filled 2024-05-17: qty 30, 30d supply, fill #0
  Filled 2024-07-12: qty 30, 30d supply, fill #1

## 2024-05-18 ENCOUNTER — Other Ambulatory Visit (HOSPITAL_COMMUNITY): Payer: Self-pay

## 2024-05-19 ENCOUNTER — Inpatient Hospital Stay: Attending: Hematology

## 2024-05-20 ENCOUNTER — Encounter: Payer: Self-pay | Admitting: Gastroenterology

## 2024-05-20 ENCOUNTER — Ambulatory Visit: Admitting: Gastroenterology

## 2024-05-20 VITALS — BP 112/75 | HR 82 | Temp 98.1°F | Resp 18 | Ht 64.0 in | Wt 129.3 lb

## 2024-05-20 DIAGNOSIS — Z980 Intestinal bypass and anastomosis status: Secondary | ICD-10-CM

## 2024-05-20 DIAGNOSIS — K6389 Other specified diseases of intestine: Secondary | ICD-10-CM

## 2024-05-20 DIAGNOSIS — L818 Other specified disorders of pigmentation: Secondary | ICD-10-CM

## 2024-05-20 DIAGNOSIS — Z85038 Personal history of other malignant neoplasm of large intestine: Secondary | ICD-10-CM

## 2024-05-20 DIAGNOSIS — D122 Benign neoplasm of ascending colon: Secondary | ICD-10-CM | POA: Diagnosis not present

## 2024-05-20 DIAGNOSIS — C260 Malignant neoplasm of intestinal tract, part unspecified: Secondary | ICD-10-CM | POA: Diagnosis not present

## 2024-05-20 DIAGNOSIS — Z1211 Encounter for screening for malignant neoplasm of colon: Secondary | ICD-10-CM

## 2024-05-20 DIAGNOSIS — R948 Abnormal results of function studies of other organs and systems: Secondary | ICD-10-CM

## 2024-05-20 DIAGNOSIS — K9189 Other postprocedural complications and disorders of digestive system: Secondary | ICD-10-CM | POA: Diagnosis not present

## 2024-05-20 MED ORDER — SODIUM CHLORIDE 0.9 % IV SOLN: 500.0000 mL | Freq: Once | INTRAVENOUS | Status: DC

## 2024-05-20 NOTE — Progress Notes (Signed)
Pt. states no medical or surgical changes since previsit or office visit. 

## 2024-05-20 NOTE — Progress Notes (Unsigned)
 [facility] History and Physical   Primary Care Physician:  Celestia Rosaline SQUIBB, NP   Reason for Procedure:   Metastatic colon cancer s/p treatment, abnormal PET/CT  Plan:    Colonoscopy     HPI: Shylyn Younce is a 53 y.o. female with a history of metastatic colon cancer diagnosed in Oct 2024, treated with L hemicolectomy in Dec 2024 followed by adjuvant FOLFOX which she completed in July 2025. CT April 2025 negative for residual disease.  Recent PET/CT with two foci of intense metabolic activity at the mid transverse colon anastomosis and subtle wall thickening concerning for recurrent colon cancer.    Past Medical History:  Diagnosis Date   Anemia    Asthma    Bartholin cyst    Chiari malformation type I (HCC)    Hernia, inguinal    Hernia, inguinal    Hypertension    ILD (interstitial lung disease) (HCC)     Past Surgical History:  Procedure Laterality Date   fibroid freezing     unsure   IR GENERIC HISTORICAL  07/27/2014   IR RADIOLOGIST EVAL & MGMT 07/27/2014 Juliene Balder, MD [facility] INTERV RAD   IR PORT REPAIR CENTRAL VENOUS ACCESS DEVICE  10/06/2023   PORTACATH PLACEMENT N/A 07/31/2023   Procedure: PLACEMENT OF PORT A CATHETER UNDER ULTRASOUND AND FLUOROSCOPIC GUIDANCE;  Surgeon: Sheldon Standing, MD;  Location: [facility];  Service: General;  Laterality: N/A;  GEN WITH ERAS 1.5 HOURS TOTAL   ROBOTIC ASSISTED TOTAL HYSTERECTOMY N/A 09/15/2018   Procedure: XI ROBOTIC ASSISTED TOTAL HYSTERECTOMY WITH SALPINGECTOMY;  Surgeon: Corene Coy, MD;  Location: [facility] [place];  Service: Gynecology;  Laterality: N/A;    Prior to Admission medications   Medication Sig Start Date End Date Taking? Authorizing Provider  amLODipine  (NORVASC ) 10 MG tablet Take 1 tablet (10 mg total) by mouth daily. 05/17/24  Yes Lanny Callander, MD  amoxicillin  (AMOXIL ) 500 MG capsule Take 1 capsule (500 mg total) by mouth every 8 (eight) hours for 7 days 05/13/24  Yes   ferrous  sulfate 325 (65 FE) MG tablet Take 1 tablet (325 mg total) by mouth daily with breakfast. 07/21/23  Yes Stacia Glendia BRAVO, MD  ibuprofen  (ADVIL ) 800 MG tablet Take 1 tablet (800 mg total) by mouth every 6 (six) hours as needed for mild - moderate pain 05/13/24  Yes   LORazepam  (ATIVAN ) 0.5 MG tablet Take 1 tablet (0.5 mg total) by mouth every 8 (eight) hours as needed for anxiety. 04/22/24  Yes [name], Fannie SAILOR, NP  oxyCODONE  (OXY IR/ROXICODONE ) 5 MG immediate release tablet Take 1 tablet (5 mg total) by mouth every 4 (four) hours as needed for severe pain (pain score 7-10). 05/11/24  Yes [name], Athena N, NP  albuterol  (VENTOLIN  HFA) 108 (90 Base) MCG/ACT inhaler Inhale 1-2 puffs into the lungs every 6 (six) hours as needed for shortness of breath. Patient not taking: Reported on 05/20/2024 01/05/24   Celestia Rosaline SQUIBB, NP  dronabinol  (MARINOL ) 5 MG capsule Take 1 capsule (5 mg total) by mouth 2 (two) times daily before lunch and supper. Patient not taking: No sig reported 02/10/24   [name], Athena N, NP  lidocaine -prilocaine  (EMLA ) cream Apply to affected area once Patient taking differently: Apply 1 Application topically as needed (for port access). 09/03/23   Lanny Callander, MD    Current Outpatient Medications  Medication Sig Dispense Refill   amLODipine  (NORVASC ) 10 MG tablet Take 1 tablet (10 mg total) by mouth daily. 30 tablet  2   amoxicillin  (AMOXIL ) 500 MG capsule Take 1 capsule (500 mg total) by mouth every 8 (eight) hours for 7 days 21 capsule 0   ferrous sulfate  325 (65 FE) MG tablet Take 1 tablet (325 mg total) by mouth daily with breakfast. 90 tablet 0   ibuprofen  (ADVIL ) 800 MG tablet Take 1 tablet (800 mg total) by mouth every 6 (six) hours as needed for mild - moderate pain 20 tablet 0   LORazepam  (ATIVAN ) 0.5 MG tablet Take 1 tablet (0.5 mg total) by mouth every 8 (eight) hours as needed for anxiety. 30 tablet 0   oxyCODONE  (OXY IR/ROXICODONE ) 5 MG  immediate release tablet Take 1 tablet (5 mg total) by mouth every 4 (four) hours as needed for severe pain (pain score 7-10). 60 tablet 0   albuterol  (VENTOLIN  HFA) 108 (90 Base) MCG/ACT inhaler Inhale 1-2 puffs into the lungs every 6 (six) hours as needed for shortness of breath. (Patient not taking: Reported on 05/20/2024) 2 each 1   dronabinol  (MARINOL ) 5 MG capsule Take 1 capsule (5 mg total) by mouth 2 (two) times daily before lunch and supper. (Patient not taking: No sig reported) 60 capsule 2   lidocaine -prilocaine  (EMLA ) cream Apply to affected area once (Patient taking differently: Apply 1 Application topically as needed (for port access).) 30 g 3   Current Facility-Administered Medications  Medication Dose Route Frequency Provider Last Rate Last Admin   0.9 %  sodium chloride  infusion  500 mL Intravenous Once Stacia Glendia BRAVO, MD        Allergies as of 05/20/2024 - Review Complete 05/20/2024  Allergen Reaction Noted   Hydrocodone  Other (See Comments) 11/18/2019    Family History  Problem Relation Age of Onset   Thyroid  disease Mother        large benign goiter   Cancer Maternal Uncle        UNKNOWN TYPE CANCER   Colon cancer Neg Hx    Esophageal cancer Neg Hx    Rectal cancer Neg Hx    Stomach cancer Neg Hx     Social History   Socioeconomic History   Marital status: Single    Spouse name: Not on file   Number of children: 2   Years of education: Not on file   Highest education level: Not on file  Occupational History   Not on file  Tobacco Use   Smoking status: Former    Current packs/day: 1.00    Average packs/day: 1 pack/day for 29.8 years (29.8 ttl pk-yrs)    Types: Cigarettes    Start date: 1996    Passive exposure: Current   Smokeless tobacco: Never  Vaping Use   Vaping status: Never Used  Substance and Sexual Activity   Alcohol use: No   Drug use: No   Sexual activity: Not on file  Other Topics Concern   Not on file  Social History Narrative    Not on file   Social Drivers of Health   Financial Resource Strain: Not on file  Food Insecurity: No Food Insecurity (06/26/2023)   Hunger Vital Sign    Worried About Running Out of Food in the Last Year: Never true    Ran Out of Food in the Last Year: Never true  Transportation Needs: No Transportation Needs (06/26/2023)   PRAPARE - Administrator, Civil Service (Medical): No    Lack of Transportation (Non-Medical): No  Physical Activity: Not on file  Stress: Not on  file  Social Connections: Not on file  Intimate Partner Violence: Patient Declined (06/26/2023)   Humiliation, Afraid, Rape, and Kick questionnaire    Fear of Current or Ex-Partner: Patient declined    Emotionally Abused: Patient declined    Physically Abused: Patient declined    Sexually Abused: Patient declined    Review of Systems:  All other review of systems negative except as mentioned in the HPI.  Physical Exam: Vital signs BP (!) 147/92   Pulse 81   Temp 98.1 F (36.7 C) (Skin)   Ht 5' 4 (1.626 m)   Wt 129 lb 4.8 oz (58.7 kg)   LMP  (LMP Unknown)   SpO2 96%   BMI 22.19 kg/m   General:   Alert,  Well-developed, well-nourished, pleasant and cooperative in NAD Airway:  Mallampati 2 Lungs:  Clear throughout to auscultation.   Heart:  Regular rate and rhythm; no murmurs, clicks, rubs,  or gallops. Abdomen:  Soft, nontender and nondistended. Normal bowel sounds.   Neuro/Psych:  Normal mood and affect. A and O x 3   Oneil Behney E. Stacia, MD [facility]

## 2024-05-20 NOTE — Progress Notes (Signed)
 Called to room to assist during endoscopic procedure.  Patient ID and intended procedure confirmed with present staff. Received instructions for my participation in the procedure from the performing physician.

## 2024-05-20 NOTE — Op Note (Signed)
 [facility] Patient Name: Carol Mack Procedure Date: 05/20/2024 1:50 PM MRN: [id number] Endoscopist: Glendia E. Stacia , MD, [id number] Age: 53 Referring MD:  Date of Birth: 12-17-1970 Gender: Female Account #: [id number] Procedure:                Colonoscopy Indications:              Personal history of malignant neoplasm of the                            colon, Abnormal PET scan of the GI tract Medicines:                Monitored Anesthesia Care Procedure:                Pre-Anesthesia Assessment:                           - Prior to the procedure, a History and Physical                            was performed, and patient medications and                            allergies were reviewed. The patient's tolerance of                            previous anesthesia was also reviewed. The risks                            and benefits of the procedure and the sedation                            options and risks were discussed with the patient.                            All questions were answered, and informed consent                            was obtained. Prior Anticoagulants: The patient has                            taken no anticoagulant or antiplatelet agents. ASA                            Grade Assessment: III - A patient with severe                            systemic disease. After reviewing the risks and                            benefits, the patient was deemed in satisfactory                            condition to undergo the procedure.  After obtaining informed consent, the colonoscope                            was passed under direct vision. Throughout the                            procedure, the patient's blood pressure, pulse, and                            oxygen saturations were monitored continuously. The                            [id number]-HQ190L Colonoscope [id number] was introduced                            through the anus  and advanced to the the cecum,                            identified by appendiceal orifice and ileocecal                            valve. The colonoscopy was performed without                            difficulty. The patient tolerated the procedure                            well. The quality of the bowel preparation was                            fair. The ileocecal valve, appendiceal orifice, and                            rectum were photographed. The bowel preparation                            used was Plenvu  via split dose instruction. Scope In: 2:13:06 PM Scope Out: 2:41:48 PM Scope Withdrawal Time: 0 hours 20 minutes 41 seconds  Total Procedure Duration: 0 hours 28 minutes 42 seconds  Findings:                 The perianal and digital rectal examinations were                            normal. Pertinent negatives include normal                            sphincter tone and no palpable rectal lesions.                           There was evidence of a prior side-to-side                            colo-colonic anastomosis at 40 cm proximal to the  anus. This was patent. The anastomosis was                            traversed. At tattoo was seen at the distal edge of                            the anastomosis                           There was two masses found at the anastomosis. One                            mass was easily visible and located at the proximal                            edge of the anastomosis. It was polypoid, rounded                            and friable, approximately 20-25 mm in size. The                            second mass was found at the distal edge of the                            anastomosis and was associated with luminal                            narrowing. This lesion was much more difficult to                            visualize. An accurate assessment of the size of                            the lesion was not  possible, but was estimated to                            be about 3-4 cm in length. Both masses were                            biopsied with a cold forceps for histology.                            Biopsies were placed in separate jars. Estimated                            blood loss was minimal.                           An 8 mm polyp was found in the proximal ascending                            colon. The polyp was sessile. The polyp was removed  with a cold snare. Resection and retrieval were                            complete. Estimated blood loss was minimal.                           The exam was otherwise normal throughout the                            examined colon.                           The retroflexed view of the distal rectum and anal                            verge was normal and showed no anal or rectal                            abnormalities. Complications:            No immediate complications. Estimated Blood Loss:     Estimated blood loss was minimal. Impression:               - Preparation of the colon was fair.                           - Two malignant appearing mass lesions present at                            the colo-colonic anastomosis, corresponding with                            the two hypermetabolic areas seen at the                            anastomosis on the PET-CT last month. Biopsied.                           - One 8 mm polyp in the proximal ascending colon,                            removed with a cold snare. Resected and retrieved.                           - The distal rectum and anal verge are normal on                            retroflexion view. Recommendation:           - Patient has a contact number available for                            emergencies. The signs and symptoms of potential  delayed complications were discussed with the                            patient. Return to  normal activities tomorrow.                            Written discharge instructions were provided to the                            patient.                           - Resume previous diet.                           - Await pathology results.                           - Follow up with oncologist for further management                            of colon cancer recurrence. Saesha Llerenas E. Stacia, MD 05/20/2024 2:59:55 PM This report has been signed electronically.

## 2024-05-20 NOTE — Progress Notes (Unsigned)
 Sedate, gd SR, tolerated procedure well, VSS, report to RN

## 2024-05-20 NOTE — Patient Instructions (Addendum)

## 2024-05-21 ENCOUNTER — Telehealth: Payer: Self-pay

## 2024-05-21 LAB — SURGICAL PATHOLOGY

## 2024-05-21 NOTE — Telephone Encounter (Signed)
  Follow up Call-     05/20/2024    1:19 PM 10/06/2023    2:46 PM 05/15/2023    7:17 AM  Call back number  Post procedure Call Back phone  # [phone]  Permission to leave phone message Yes Yes Yes     Patient questions:  Do you have a fever, pain , or abdominal swelling? No. Pain Score  0 *  Have you tolerated food without any problems? Yes.    Have you been able to return to your normal activities? Yes.    Do you have any questions about your discharge instructions: Diet   No. Medications  No. Follow up visit  No.  Do you have questions or concerns about your Care? No.  Actions: * If pain score is 4 or above: No action needed, pain <4.

## 2024-05-21 NOTE — Telephone Encounter (Signed)
 Opened in error

## 2024-05-24 ENCOUNTER — Ambulatory Visit (INDEPENDENT_AMBULATORY_CARE_PROVIDER_SITE_OTHER): Admitting: Primary Care

## 2024-05-25 ENCOUNTER — Ambulatory Visit: Payer: Self-pay | Admitting: Gastroenterology

## 2024-05-25 ENCOUNTER — Ambulatory Visit (HOSPITAL_COMMUNITY)
Admission: RE | Admit: 2024-05-25 | Discharge: 2024-05-25 | Disposition: A | Discharge status: Home / Self Care | Source: Ambulatory Visit | Attending: Hematology | Admitting: Hematology

## 2024-05-25 DIAGNOSIS — C189 Malignant neoplasm of colon, unspecified: Secondary | ICD-10-CM | POA: Diagnosis present

## 2024-05-25 DIAGNOSIS — C772 Secondary and unspecified malignant neoplasm of intra-abdominal lymph nodes: Secondary | ICD-10-CM | POA: Diagnosis not present

## 2024-05-25 DIAGNOSIS — Z85038 Personal history of other malignant neoplasm of large intestine: Secondary | ICD-10-CM | POA: Diagnosis not present

## 2024-05-25 MED ORDER — LIDOCAINE HCL 1 % IJ SOLN
INTRAMUSCULAR | Status: AC
  Filled 2024-05-25: qty 20

## 2024-05-25 NOTE — Progress Notes (Signed)
 Ms. Henzler,  Unfortunately, the biopsies of the masses in your colon at the anastomosis were consistent with colon cancer, as suspected.  Please follow up with Dr. Lanny as scheduled (Nov 17th) to discuss the next steps for treating your recurrent cancer.

## 2024-05-25 NOTE — Procedures (Signed)
 Vascular and Interventional Radiology Procedure Note  Patient: Shevon Sian DOB: [birth year] Medical Record Number: [id number] Note Date/Time: 05/25/24 3:08 PM   Performing Physician: Thom Hall, MD Assistant(s): None  Diagnosis: L axillary LN. HM on PET. Hx Colon CA   Procedure: LEFT AXILLARY LYMPH NODE BIOPSY  Anesthesia: Local Anesthetic Complications: None Estimated Blood Loss: Minimal Specimens: Sent for Pathology  Findings:  Successful Ultrasound-guided biopsy of L axillary LN. A total of 3 samples were obtained. Hemostasis of the tract was achieved using Manual Pressure.  Plan: Bed rest for 0 hours.  See detailed procedure note with images in PACS. The patient tolerated the procedure well without incident or complication and was returned to Recovery in stable condition.    Thom Hall, MD [facility]   Pager. [phone] Clinic. [phone]

## 2024-05-27 ENCOUNTER — Other Ambulatory Visit (HOSPITAL_COMMUNITY): Payer: Self-pay

## 2024-05-27 ENCOUNTER — Other Ambulatory Visit: Payer: Self-pay | Admitting: Nurse Practitioner

## 2024-05-27 DIAGNOSIS — Z515 Encounter for palliative care: Secondary | ICD-10-CM

## 2024-05-27 DIAGNOSIS — C189 Malignant neoplasm of colon, unspecified: Secondary | ICD-10-CM

## 2024-05-27 DIAGNOSIS — G893 Neoplasm related pain (acute) (chronic): Secondary | ICD-10-CM

## 2024-05-27 LAB — SURGICAL PATHOLOGY

## 2024-05-27 MED ORDER — OXYCODONE HCL 5 MG PO TABS
5.0000 mg | ORAL_TABLET | ORAL | 0 refills | Status: DC | PRN
  Filled 2024-05-27: qty 60, 10d supply, fill #0

## 2024-05-31 ENCOUNTER — Inpatient Hospital Stay: Attending: Hematology | Admitting: Hematology

## 2024-05-31 DIAGNOSIS — C772 Secondary and unspecified malignant neoplasm of intra-abdominal lymph nodes: Secondary | ICD-10-CM | POA: Diagnosis present

## 2024-05-31 DIAGNOSIS — C189 Malignant neoplasm of colon, unspecified: Secondary | ICD-10-CM | POA: Diagnosis not present

## 2024-05-31 DIAGNOSIS — C186 Malignant neoplasm of descending colon: Secondary | ICD-10-CM | POA: Insufficient documentation

## 2024-05-31 NOTE — Progress Notes (Signed)
 [facility]   Telephone:([phone]) [phone] [phone]   Clinic Follow up Note   Patient Care Team: Celestia Rosaline SQUIBB, NP as PCP - General (Internal Medicine) Lanny Callander, MD as Consulting Physician (Hematology and Oncology) Debby Hila, MD as Consulting Physician (General Surgery) Stacia Glendia BRAVO, MD as Consulting Physician (Gastroenterology) [name], Fannie SAILOR, NP as Nurse Practitioner [facility] and Palliative Medicine) 05/31/2024  I connected with Carol Mack on 05/31/24 at  1:40 PM EST by telephone and verified that I am speaking with the correct person using two identifiers.   I discussed the limitations, risks, security and privacy concerns of performing an evaluation and management service by telephone and the availability of in person appointments. I also discussed with the patient that there may be a patient responsible charge related to this service. The patient expressed understanding and agreed to proceed.   Patient's location:  Home  Provider's location:  Office    CHIEF COMPLAINT: Follow-up biopsy results   CURRENT THERAPY: Cancer surveillance  Oncology history Colon cancer metastasized to mesenteric lymph nodes (HCC) -[id number] with mesentery node and possible liver metastasis, G2, MMR proficient, KRAS G12D mutation (+) -Patient initially presented with abdominal pain to the emergency room, underwent a colonoscopy in October 2024 which showed a large mass in the splenic lecture of left side colon. -She underwent left hemicolectomy on June 26, 2023.  Large mesenteric mass was also biopsied which confirmed metastatic adenocarcinoma. -Staging CT scan and a liver MRI showed a suspicious 0.8 cm mass in the left lobe of liver, concerning for metastasis.  No other evidence of distant metastatic disease. Liver biopsy on 08/27/2023 was benign.  -I discussed her metastatic disease and the probable incurable nature of her disease. -I  recommend systemic treatment with chemotherapy FOLFOX, she started on 08/06/23. -NGS [facility]us showed KRAS G12D and PIK3CA mutations, not a candidate for targeted therapy  -Her tumor was tested of for MMR which were normal, (+) KRAS mutation, not a candidate for targeted or immunotherapy. Beva added from cycle 2, she completed 12 cycles chemo on 01/13/2024 -CT 10/20/2023 was negative for residual disease. Abdominal MRI 02/05/2024 was negative for liver mets or other residual disease  - Will continue cancer surveillance with imaging and ctDNA. Signatera was positive in Sep 2025. PET showed Two foci of Intense metabolic activity at the mid transverse colonic anastomosis with associated subtle bowel wall thickening and adjacent nodule, suspicious for recurrent colon cancer.   Assessment & Plan Recurrent colon cancer at prior surgical site Recurrent colon cancer confirmed by colonoscopy and biopsy at the prior surgical site, with two lesions identified before and after the suture line. PET scan shows several enlarged lymph nodes, with biopsy of the left axillary lymph node negative for cancer. Uncertainty remains regarding the relation of mediastinal and retroperitoneal lymph nodes to the colon cancer. Surgical resection of the affected transverse colon is planned. Biopsy of mediastinal and retroperitoneal lymph nodes during surgery is considered to assess her relation to the cancer. If biopsy is not feasible, these lymph nodes will be monitored with follow-up imaging and blood tests post-surgery. - Sent message to Dr. Debby to coordinate surgical resection of the affected transverse colon. - Will discuss with Dr. Debby the possibility of biopsying mediastinal and retroperitoneal lymph nodes during surgery. - If biopsy is not feasible, will plan to monitor lymph nodes with follow-up imaging and blood tests post-surgery. - Will schedule follow-up imaging and blood tests 2-3 months post-surgery.  Plan - I reviewed  her colonoscopy and the biopsy results, which confirmed local recurrence at the previous anastomosis.  I will reach out to her colorectal surgeon Dr. Debby for surgery. - I reviewed her left axillary node biopsy, which was negative for malignant cells.  She does not have mildly hypermetabolic mediastinal and RP nodes, we will repeat a scan in 2 to 3 months.  I will ask Dr. Debby to see if she can biopsy during the surgery.   SUMMARY OF ONCOLOGIC HISTORY: Oncology History  Colon cancer metastasized to mesenteric lymph nodes (HCC)  06/26/2023 Cancer Staging   Staging form: Colon and Rectum, AJCC 8th Edition - Pathologic stage from 06/26/2023: Stage IVC (pT4a, pN1b, pM1c) - Signed by Lanny Callander, MD on 07/25/2023 Residual tumor (R): R1 - Microscopic   07/25/2023 Initial Diagnosis   Colon cancer metastasized to mesenteric lymph nodes (HCC)   08/06/2023 -  Chemotherapy   Patient is on Treatment Plan : COLORECTAL FOLFOX + Bevacizumab  q14d       Discussed the use of AI scribe software for clinical note transcription with the patient, who gave verbal consent to proceed.  History of Present Illness Carol Mack is a 53 year old female with recurrent colon cancer who presents for follow-up after a recent colonoscopy. She was referred by Dr. Stacia for evaluation of recurrent colon cancer.  A recent colonoscopy and biopsy identified cancer in two locations, one before and one after the suture line from previous surgery. A PET scan revealed several enlarged lymph nodes, including a non-cancerous node in the left armpit. Additional lymph nodes in the middle chest and back of the stomach remain unbiopsied, with their relation to the colon cancer undetermined. She experiences discomfort from a hernia in her stomach, which occasionally protrudes.     REVIEW OF SYSTEMS:   Constitutional: Denies fevers, chills or abnormal weight loss Eyes: Denies blurriness of vision Ears, nose, mouth, throat, and  face: Denies mucositis or sore throat Respiratory: Denies cough, dyspnea or wheezes Cardiovascular: Denies palpitation, chest discomfort or lower extremity swelling Gastrointestinal:  Denies nausea, heartburn or change in bowel habits Skin: Denies abnormal skin rashes Lymphatics: Denies new lymphadenopathy or easy bruising Neurological:Denies numbness, tingling or new weaknesses Behavioral/Psych: Mood is stable, no new changes  All other systems were reviewed with the patient and are negative.  MEDICAL HISTORY:  Past Medical History:  Diagnosis Date   Anemia    Asthma    Bartholin cyst    Chiari malformation type I (HCC)    Hernia, inguinal    Hernia, inguinal    Hypertension    ILD (interstitial lung disease) (HCC)     SURGICAL HISTORY: Past Surgical History:  Procedure Laterality Date   fibroid freezing     unsure   IR GENERIC HISTORICAL  07/27/2014   IR RADIOLOGIST EVAL & MGMT 07/27/2014 Juliene Balder, MD [facility] INTERV RAD   IR PORT REPAIR CENTRAL VENOUS ACCESS DEVICE  10/06/2023   PORTACATH PLACEMENT N/A 07/31/2023   Procedure: PLACEMENT OF PORT A CATHETER UNDER ULTRASOUND AND FLUOROSCOPIC GUIDANCE;  Surgeon: Sheldon Standing, MD;  Location: [facility];  Service: General;  Laterality: N/A;  GEN WITH ERAS 1.5 HOURS TOTAL   ROBOTIC ASSISTED TOTAL HYSTERECTOMY N/A 09/15/2018   Procedure: XI ROBOTIC ASSISTED TOTAL HYSTERECTOMY WITH SALPINGECTOMY;  Surgeon: Corene Coy, MD;  Location: [facility];  Service: Gynecology;  Laterality: N/A;    I have reviewed the social history and family history with the patient and they are unchanged from previous  note.  ALLERGIES:  is allergic to hydrocodone .  MEDICATIONS:  Current Outpatient Medications  Medication Sig Dispense Refill   albuterol  (VENTOLIN  HFA) 108 (90 Base) MCG/ACT inhaler Inhale 1-2 puffs into the lungs every 6 (six) hours as needed for shortness of breath. (Patient not taking: Reported on 05/20/2024) 2 each 1    amLODipine  (NORVASC ) 10 MG tablet Take 1 tablet (10 mg total) by mouth daily. 30 tablet 2   amoxicillin  (AMOXIL ) 500 MG capsule Take 1 capsule (500 mg total) by mouth every 8 (eight) hours for 7 days 21 capsule 0   dronabinol  (MARINOL ) 5 MG capsule Take 1 capsule (5 mg total) by mouth 2 (two) times daily before lunch and supper. (Patient not taking: No sig reported) 60 capsule 2   ferrous sulfate  325 (65 FE) MG tablet Take 1 tablet (325 mg total) by mouth daily with breakfast. 90 tablet 0   ibuprofen  (ADVIL ) 800 MG tablet Take 1 tablet (800 mg total) by mouth every 6 (six) hours as needed for mild - moderate pain 20 tablet 0   lidocaine -prilocaine  (EMLA ) cream Apply to affected area once (Patient taking differently: Apply 1 Application topically as needed (for port access).) 30 g 3   LORazepam  (ATIVAN ) 0.5 MG tablet Take 1 tablet (0.5 mg total) by mouth every 8 (eight) hours as needed for anxiety. 30 tablet 0   oxyCODONE  (OXY IR/ROXICODONE ) 5 MG immediate release tablet Take 1 tablet (5 mg total) by mouth every 4 (four) hours as needed for severe pain (pain score 7-10). 60 tablet 0   No current facility-administered medications for this visit.    PHYSICAL EXAMINATION: Not performed   LABORATORY DATA:  I have reviewed the data as listed    Latest Ref Rng & Units 04/07/2024    9:43 AM 03/09/2024   10:15 AM 02/03/2024    9:03 AM  CBC  WBC 4.0 - 10.5 K/uL 7.2  5.9  6.1   Hemoglobin 12.0 - 15.0 g/dL 85.9  86.4  87.2   Hematocrit 36.0 - 46.0 % 39.7  38.8  36.2   Platelets 150 - 400 K/uL 280  251  270         Latest Ref Rng & Units 04/07/2024    9:43 AM 03/09/2024   10:15 AM 02/03/2024    9:03 AM  CMP  Glucose 70 - 99 mg/dL 872  861  873   BUN 6 - 20 mg/dL 10  10  13    Creatinine 0.44 - 1.00 mg/dL 9.27  9.28  9.23   Sodium 135 - 145 mmol/L 139  141  139   Potassium 3.5 - 5.1 mmol/L 3.2  3.4  3.6   Chloride 98 - 111 mmol/L 106  107  106   CO2 22 - 32 mmol/L 25  25  26    Calcium  8.9 -  10.3 mg/dL 9.1  9.3  9.3   Total Protein 6.5 - 8.1 g/dL 7.3  7.2  7.4   Total Bilirubin 0.0 - 1.2 mg/dL 0.3  0.3  0.5   Alkaline Phos 38 - 126 U/L 65  66  67   AST 15 - 41 U/L 13  14  15    ALT 0 - 44 U/L 9  9  9        RADIOGRAPHIC STUDIES: I have personally reviewed the radiological images as listed and agreed with the findings in the report. No results found.     I discussed the assessment and treatment plan with  the patient. The patient was provided an opportunity to ask questions and all were answered. The patient agreed with the plan and demonstrated an understanding of the instructions.   The patient was advised to call back or seek an in-person evaluation if the symptoms worsen or if the condition fails to improve as anticipated.  I provided 25 minutes of non face-to-face telephone visit time during this encounter, including review of chart and various tests results, discussions about plan of care and coordination of care plan.    Onita Mattock, MD 05/31/24

## 2024-05-31 NOTE — Assessment & Plan Note (Signed)
-  [id number] with mesentery node and possible liver metastasis, G2, MMR proficient, KRAS G12D mutation (+) -Patient initially presented with abdominal pain to the emergency room, underwent a colonoscopy in October 2024 which showed a large mass in the splenic lecture of left side colon. -She underwent left hemicolectomy on June 26, 2023.  Large mesenteric mass was also biopsied which confirmed metastatic adenocarcinoma. -Staging CT scan and a liver MRI showed a suspicious 0.8 cm mass in the left lobe of liver, concerning for metastasis.  No other evidence of distant metastatic disease. Liver biopsy on 08/27/2023 was benign.  -I discussed her metastatic disease and the probable incurable nature of her disease. -I recommend systemic treatment with chemotherapy FOLFOX, she started on 08/06/23. -NGS [facility] showed KRAS G12D and PIK3CA mutations, not a candidate for targeted therapy  -Her tumor was tested of for MMR which were normal, (+) KRAS mutation, not a candidate for targeted or immunotherapy. Beva added from cycle 2, she completed 12 cycles chemo on 01/13/2024 -CT 10/20/2023 was negative for residual disease. Abdominal MRI 02/05/2024 was negative for liver mets or other residual disease  - Will continue cancer surveillance with imaging and ctDNA. Signatera was positive in Sep 2025. PET showed Two foci of Intense metabolic activity at the mid transverse colonic anastomosis with associated subtle bowel wall thickening and adjacent nodule, suspicious for recurrent colon cancer.

## 2024-06-14 ENCOUNTER — Other Ambulatory Visit (HOSPITAL_COMMUNITY): Payer: Self-pay

## 2024-06-14 ENCOUNTER — Other Ambulatory Visit: Payer: Self-pay | Admitting: Nurse Practitioner

## 2024-06-14 DIAGNOSIS — G893 Neoplasm related pain (acute) (chronic): Secondary | ICD-10-CM

## 2024-06-14 DIAGNOSIS — Z515 Encounter for palliative care: Secondary | ICD-10-CM

## 2024-06-14 DIAGNOSIS — C189 Malignant neoplasm of colon, unspecified: Secondary | ICD-10-CM

## 2024-06-14 MED ORDER — OXYCODONE HCL 5 MG PO TABS
5.0000 mg | ORAL_TABLET | ORAL | 0 refills | Status: DC | PRN
  Filled 2024-06-14: qty 60, 10d supply, fill #0

## 2024-06-15 ENCOUNTER — Encounter: Payer: Self-pay | Admitting: Hematology

## 2024-06-15 ENCOUNTER — Other Ambulatory Visit (HOSPITAL_COMMUNITY): Payer: Self-pay

## 2024-06-15 ENCOUNTER — Other Ambulatory Visit: Payer: Self-pay

## 2024-06-15 ENCOUNTER — Ambulatory Visit: Payer: Self-pay | Admitting: General Surgery

## 2024-06-15 MED ORDER — METRONIDAZOLE 500 MG PO TABS
1000.0000 mg | ORAL_TABLET | Freq: Three times a day (TID) | ORAL | 0 refills | Status: DC
  Filled 2024-06-15 – 2024-07-12 (×2): qty 6, 1d supply, fill #0

## 2024-06-15 MED ORDER — POLYETHYLENE GLYCOL 3350 17 GM/SCOOP PO POWD
ORAL | 0 refills | Status: AC
  Filled 2024-06-15: qty 238, 2d supply, fill #0
  Filled 2024-07-12: qty 238, 1d supply, fill #0

## 2024-06-15 MED ORDER — BISACODYL EC 5 MG PO TBEC
20.0000 mg | DELAYED_RELEASE_TABLET | Freq: Every day | ORAL | 0 refills | Status: AC
  Filled 2024-06-15 – 2024-07-12 (×2): qty 4, 1d supply, fill #0

## 2024-06-15 MED ORDER — NEOMYCIN SULFATE 500 MG PO TABS
1000.0000 mg | ORAL_TABLET | Freq: Three times a day (TID) | ORAL | 0 refills | Status: DC
  Filled 2024-06-15 – 2024-07-12 (×2): qty 6, 1d supply, fill #0

## 2024-06-15 MED ORDER — ONDANSETRON HCL 4 MG PO TABS
4.0000 mg | ORAL_TABLET | Freq: Two times a day (BID) | ORAL | 0 refills | Status: AC
  Filled 2024-06-15 – 2024-07-12 (×2): qty 2, 1d supply, fill #0

## 2024-06-15 NOTE — H&P (Signed)
 PROVIDER:  Bilan Tedesco CHRISTINE Jordayn Mink, MD  MRN: [id number] DOB: April 28, 1971 DATE OF ENCOUNTER: 06/15/2024  Subjective      History of Present Illness: Carol Mack is a 53 y.o. female who is seen today as an office consultation at the request of Dr. Lanny for evaluation of No chief complaint on file. .  Patient with a history of T4N1 colon cancer.  She underwent a left hemicolectomy with side-to-side anastomosis on June 26, 2023.  She was noted to have a large mesenteric lymph node at the base of her middle colic vessel.  This was removed with surgery.  She did have a ileus after surgery that resolved by postop day 4.  She was discharged on postop day 6.  She then underwent adjuvant chemotherapy.   There is a 5 mm liver lesion that is too small to be characterized and currently stable on follow-up CT scan.  Follow-up colonoscopy was performed on 05/20/2024.  This showed a mass at the proximal and distal areas of the anastomosis.  Biopsies show recurrent adenocarcinoma.  PET scan was completed in October 2025 and showed 2 foci of metabolic activity in the mid transverse colon, a mildly enlarged periaortic lymph node with mild metabolic activity and enlarged prevascular and anterior mediastinal nodes with moderate metabolic activity.  MRI of the liver did not show any significant lesions.   Review of Systems: A complete review of systems was obtained from the patient.  I have reviewed this information and discussed as appropriate with the patient.  See HPI as well for other ROS.   Medical History: Past Medical History:  Diagnosis Date   Asthma, unspecified asthma severity, unspecified whether complicated, unspecified whether persistent (HHS-HCC)    Hypertension    Thyroid  disease     There is no problem list on file for this patient.   Past Surgical History:  Procedure Laterality Date   COLON SURGERY     fibroids       Allergies  Allergen Reactions   Vicodin  [Hydrocodone -Acetaminophen ] Unknown    Current Outpatient Medications on File Prior to Visit  Medication Sig Dispense Refill   albuterol  MDI, PROVENTIL , VENTOLIN , PROAIR , HFA 90 mcg/actuation inhaler Inhale into the lungs     amLODIPine -olmesartan  (AZOR ) 10-40 mg tablet Take 1 tablet by mouth once daily (Patient not taking: Reported on 08/18/2023)     bisacodyL  (DULCOLAX) 5 mg EC tablet Take 4 tablets (20 mg total) by mouth once daily as needed for Constipation for up to 1 dose (Patient not taking: Reported on 08/18/2023) 4 tablet 0   oxyCODONE  (ROXICODONE ) 5 MG immediate release tablet Take 1 tablet (5 mg total) by mouth every 6 (six) hours as needed for Pain 20 tablet 0   polyethylene glycol (MIRALAX ) packet Take 17 g by mouth 2 (two) times daily     No current facility-administered medications on file prior to visit.    Family History  Problem Relation Age of Onset   High blood pressure (Hypertension) Mother    High blood pressure (Hypertension) Father      Social History   Tobacco Use  Smoking Status Former   Types: Cigarettes  Smokeless Tobacco Not on file     Social History   Socioeconomic History   Marital status: Single  Tobacco Use   Smoking status: Former    Types: Cigarettes  Vaping Use   Vaping status: Unknown  Substance and Sexual Activity   Alcohol use: Not Currently   Drug use: Not Currently  Social Drivers of Health   Food Insecurity: No Food Insecurity (06/26/2023)   Received from [facility]   Hunger Vital Sign    Within the past 12 months, you worried that your food would run out before you got the money to buy more.: Never true    Within the past 12 months, the food you bought just didn't last and you didn't have money to get more.: Never true  Transportation Needs: No Transportation Needs (06/26/2023)   Received from [facility] - Transportation    Lack of Transportation (Medical): No    Lack of Transportation (Non-Medical): No  Housing  Stability: Unknown (07/21/2023)   Housing Stability Vital Sign    Homeless in the Last Year: No    Objective:    Vitals:   06/15/24 0950  PainSc: 0-No pain     Exam Gen: NAD Abd: soft, small reducible epigastric hernia   Labs, Imaging and Diagnostic Testing: CT images and report reviewed.  CT PET images and report reviewed.  Colonoscopy images and report reviewed.  Pathology reports reviewed.  MR abdomen report reviewed.  Assessment and Plan:  Local recurrence of malignant neoplasm of colon (CMS/HHS-HCC)  (primary encounter diagnosis) We discussed today performing a partial colectomy and resecting the anastomosis and several centimeters beyond on either side.  We discussed that we may be able to mobilize the splenic flexure and have enough of her colon available for an anastomosis.  If this is not the case, we will perform an extended right hemicolectomy and perform a small bowel to descending colon anastomosis.  We discussed the risk of loose stools and diarrhea if the latter option is chosen.  She does have an epigastric hernia that is bothering her.  We discussed performing extraction through this site and then closing it primarily.  We discussed the high chance of recurrence with this approach.  All questions were answered. The surgery and anatomy were described to the patient as well as the risks of surgery and the possible complications.  These include: Bleeding, deep abdominal infections and possible wound complications such as hernia and infection, damage to adjacent structures, leak of surgical connections, which can lead to other surgeries and possibly an ostomy, possible need for other procedures, such as abscess drains in radiology, possible prolonged hospital stay, possible diarrhea from removal of part of the colon, possible constipation from narcotics, possible bowel, bladder or sexual dysfunction if having rectal surgery, prolonged fatigue/weakness or appetite loss, possible  early recurrence of of disease, possible complications of their medical problems such as heart disease or arrhythmias or lung problems, death (less than 1%). I believe the patient understands and wishes to proceed with the surgery.   Epigastric hernia Approximately 2 cm in diameter, reducible.  Will plan on extraction through this site and closure   Bernarda JAYSON Ned, MD Colon and Rectal Surgery [facility] Surgery

## 2024-06-16 ENCOUNTER — Encounter: Payer: Self-pay | Admitting: Nurse Practitioner

## 2024-06-16 ENCOUNTER — Inpatient Hospital Stay: Attending: Hematology | Admitting: Nurse Practitioner

## 2024-06-16 DIAGNOSIS — G893 Neoplasm related pain (acute) (chronic): Secondary | ICD-10-CM

## 2024-06-16 DIAGNOSIS — C189 Malignant neoplasm of colon, unspecified: Secondary | ICD-10-CM

## 2024-06-16 DIAGNOSIS — G629 Polyneuropathy, unspecified: Secondary | ICD-10-CM | POA: Diagnosis not present

## 2024-06-16 DIAGNOSIS — C772 Secondary and unspecified malignant neoplasm of intra-abdominal lymph nodes: Secondary | ICD-10-CM | POA: Diagnosis not present

## 2024-06-16 DIAGNOSIS — Z515 Encounter for palliative care: Secondary | ICD-10-CM | POA: Diagnosis not present

## 2024-06-16 DIAGNOSIS — Z7189 Other specified counseling: Secondary | ICD-10-CM | POA: Diagnosis not present

## 2024-06-16 DIAGNOSIS — R530 Neoplastic (malignant) related fatigue: Secondary | ICD-10-CM

## 2024-06-16 NOTE — Progress Notes (Signed)
 Palliative Medicine [facility]  [phone] [phone]   Name: Carol Mack Date: 06/16/2024 MRN: [id number]  DOB: [birth year]  Patient Care Team: Carol Rosaline SQUIBB, NP as PCP - General (Internal Medicine) Carol Callander, MD as Consulting Physician (Hematology and Oncology) Carol Hila, MD as Consulting Physician (General Surgery) Carol Glendia BRAVO, MD as Consulting Physician (Gastroenterology) [name], Carol SAILOR, NP as Nurse Practitioner Carol Mack and Palliative Medicine)   I connected with Carol Mack on 06/16/24 at  4:00 PM EST by telephone and verified that I am speaking with the correct person using two identifiers.   I discussed the limitations, risks, security and privacy concerns of performing an evaluation and management service by telemedicine and the availability of in-person appointments. I also discussed with the patient that there may be a patient responsible charge related to this service. The patient expressed understanding and agreed to proceed.   Other persons participating in the visit and their role in the encounter: N/A   Patient's location: Home   Provider's location: [facility] [place]   INTERVAL HISTORY: Carol Mack is a 53 y.o. female with oncologic medical history including metastatic colon cancer with liver involvement.  Palliative is seeing patient for symptom management and goals of care.   SOCIAL HISTORY:     reports that she has quit smoking. Her smoking use included cigarettes. She started smoking about 29 years ago. She has a 29.9 pack-year smoking history. She has been exposed to tobacco smoke. She has never used smokeless tobacco. She reports that she does not drink alcohol and does not use drugs.  ADVANCE DIRECTIVES:  None on file   CODE STATUS: Full code  PAST MEDICAL HISTORY: Past Medical History:  Diagnosis Date   Anemia    Asthma    Bartholin cyst    Chiari malformation type I (HCC)    Hernia,  inguinal    Hernia, inguinal    Hypertension    ILD (interstitial lung disease) (HCC)     ALLERGIES:  is allergic to hydrocodone .  MEDICATIONS:  Current Outpatient Medications  Medication Sig Dispense Refill   albuterol  (VENTOLIN  HFA) 108 (90 Base) MCG/ACT inhaler Inhale 1-2 puffs into the lungs every 6 (six) hours as needed for shortness of breath. (Patient not taking: Reported on 05/20/2024) 2 each 1   amLODipine  (NORVASC ) 10 MG tablet Take 1 tablet (10 mg total) by mouth daily. 30 tablet 2   amoxicillin  (AMOXIL ) 500 MG capsule Take 1 capsule (500 mg total) by mouth every 8 (eight) hours for 7 days 21 capsule 0   bisacodyl  5 MG EC tablet Take 4 tablets (20 mg total) by mouth once daily as needed for Constipation for up to 1 dose 4 tablet 0   dronabinol  (MARINOL ) 5 MG capsule Take 1 capsule (5 mg total) by mouth 2 (two) times daily before lunch and supper. (Patient not taking: No sig reported) 60 capsule 2   ferrous sulfate  325 (65 FE) MG tablet Take 1 tablet (325 mg total) by mouth daily with breakfast. 90 tablet 0   ibuprofen  (ADVIL ) 800 MG tablet Take 1 tablet (800 mg total) by mouth every 6 (six) hours as needed for mild - moderate pain 20 tablet 0   lidocaine -prilocaine  (EMLA ) cream Apply to affected area once (Patient taking differently: Apply 1 Application topically as needed (for port access).) 30 g 3   LORazepam  (ATIVAN ) 0.5 MG tablet Take 1 tablet (0.5 mg total) by mouth every 8 (eight) hours as needed  for anxiety. 30 tablet 0   metroNIDAZOLE  (FLAGYL ) 500 MG tablet Take 2 tablets (1,000 mg total) by mouth 3 (three) times daily for 3 doses Take according to your procedure colon prep instructions 6 tablet 0   neomycin (MYCIFRADIN) 500 MG tablet Take 2 tablets (1,000 mg total) by mouth 3 (three) times daily for 3 doses Take according to your procedure colon prep instructions 6 tablet 0   ondansetron  (ZOFRAN ) 4 MG tablet Take 1 tablet (4 mg total) by mouth 2 (two) times daily Take 1  tablet at 3pm and then 1 tablet at 10pm 2 tablet 0   oxyCODONE  (OXY IR/ROXICODONE ) 5 MG immediate release tablet Take 1 tablet (5 mg total) by mouth every 4 (four) hours as needed for severe pain (pain score 7-10). 60 tablet 0   polyethylene glycol powder (GLYCOLAX /MIRALAX ) 17 GM/SCOOP powder Take by mouth once for 1 dose Take according to your procedure prep instructions. 238 g 0   No current facility-administered medications for this visit.    VITAL SIGNS: LMP  (LMP Unknown)  There were no vitals filed for this visit.  Estimated body mass index is 22.19 kg/m as calculated from the following:   Height as of 05/20/24: 5' 4 (1.626 m).   Weight as of 05/20/24: 129 lb 4.8 oz (58.7 kg).   PERFORMANCE STATUS (ECOG) : 1 - Symptomatic but completely ambulatory  IMPRESSION: Discussed the use of AI scribe software for clinical note transcription with the patient, who gave verbal consent to proceed.  History of Present Illness Carol Mack is a 53 year old female with metastatic colon cancer who I connected by phone with for symptom management follow-up.  Denies concerns of nausea, vomiting, constipation, or diarrhea. Occasional fatigue. Trying to remain active. Appetite fluctuates. Some  days are better than others.  Carol Mack shares she is trying to remain positive as she has several new areas of concern for cancer. She is undergoing work-up for surgical resection of the transverse coon with intended biopsies.   She reports experiencing pain between her back and stomach, which she attributes to an area previously identified as requiring surgical intervention. The pain is located in her stomach and back, and she manages it with pain medication. She is tolerating oxycodone  without difficulty. Effective when taking. Does not require around the clock.   Carol Mack is currently working and tries to keep busy to distract herself from the situation (recurrence) and avoid stress. She is remaining positive  and hopeful for stability and improvement. Emotional support provided.   All questions answered and support provided.  I discussed the importance of continued conversation with family and their medical providers regarding overall plan of care and treatment options, ensuring decisions are within the context of the patients values and GOCs. Assessment & Plan Peripheral neuropathy and Cancer Related Pain  Chronic peripheral neuropathy with numbness in fingers and feet. Gabapentin  was previously ineffective and she chose to self discontinue. Oxycodone  provides some relief for neuropathy and chronic pain, particularly at night. Education provided on use of medications specifically for neuropathic pain and discomfort. She prefer to avoid excessive medication and are interested in herbal alternatives. - Continue oxycodone  5 mg to be picked up at [facility]. - Discuss herbal alternatives and preference to avoid excessive medication.  Suspected reoccurrence  -Scheduled for follow-up and possible surgical intervention with Dr. Debby.  -She is emotional however trying to remain busy and decrease stress and anxiety related to new areas of concern. She  is remaining hopeful and positive  Anxiety Anxiety significantly improved with lorazepam . - Continue lorazepam  two to three times daily.  Follow-up Advised to return for follow-up care as needed. - Schedule follow-up in 4-6 weeks.   Patient expressed understanding and was in agreement with this plan. She also understands that She can call the clinic at any time with any questions, concerns, or complaints.   Any controlled substances utilized were prescribed in the context of palliative care. PDMP has been reviewed.   I provided 35 minutes of non face-to-face telephone visit time during this encounter, and > 50% was spent counseling as documented under my assessment & plan. Visit consisted of counseling and education dealing with the complex and  emotionally intense issues of symptom management and palliative care in the setting of serious and potentially life-threatening illness.  Levon Borer, AGPCNP-BC  Palliative Medicine Team/[facility]

## 2024-06-21 ENCOUNTER — Ambulatory Visit (INDEPENDENT_AMBULATORY_CARE_PROVIDER_SITE_OTHER): Admitting: Primary Care

## 2024-06-24 ENCOUNTER — Other Ambulatory Visit: Payer: Self-pay | Admitting: Nurse Practitioner

## 2024-06-24 ENCOUNTER — Other Ambulatory Visit: Payer: Self-pay

## 2024-06-24 ENCOUNTER — Other Ambulatory Visit (HOSPITAL_COMMUNITY): Payer: Self-pay

## 2024-06-24 DIAGNOSIS — C189 Malignant neoplasm of colon, unspecified: Secondary | ICD-10-CM

## 2024-06-24 DIAGNOSIS — G893 Neoplasm related pain (acute) (chronic): Secondary | ICD-10-CM

## 2024-06-24 DIAGNOSIS — Z515 Encounter for palliative care: Secondary | ICD-10-CM

## 2024-06-24 DIAGNOSIS — F419 Anxiety disorder, unspecified: Secondary | ICD-10-CM

## 2024-06-24 DIAGNOSIS — G4709 Other insomnia: Secondary | ICD-10-CM

## 2024-06-24 MED ORDER — LORAZEPAM 0.5 MG PO TABS
0.5000 mg | ORAL_TABLET | Freq: Three times a day (TID) | ORAL | 0 refills | Status: DC | PRN
  Filled 2024-06-24 (×3): qty 30, 10d supply, fill #0

## 2024-06-24 MED ORDER — OXYCODONE HCL 5 MG PO TABS
5.0000 mg | ORAL_TABLET | ORAL | 0 refills | Status: DC | PRN
  Filled 2024-06-24: qty 60, 10d supply, fill #0

## 2024-06-25 ENCOUNTER — Other Ambulatory Visit: Payer: Self-pay

## 2024-06-25 ENCOUNTER — Other Ambulatory Visit (HOSPITAL_COMMUNITY): Payer: Self-pay

## 2024-06-29 ENCOUNTER — Other Ambulatory Visit (HOSPITAL_COMMUNITY): Payer: Self-pay

## 2024-07-06 ENCOUNTER — Telehealth: Payer: Self-pay | Admitting: Nurse Practitioner

## 2024-07-12 ENCOUNTER — Other Ambulatory Visit: Payer: Self-pay | Admitting: Nurse Practitioner

## 2024-07-12 ENCOUNTER — Encounter: Payer: Self-pay | Admitting: Hematology

## 2024-07-12 ENCOUNTER — Other Ambulatory Visit (HOSPITAL_COMMUNITY): Payer: Self-pay

## 2024-07-12 DIAGNOSIS — F419 Anxiety disorder, unspecified: Secondary | ICD-10-CM

## 2024-07-12 DIAGNOSIS — G893 Neoplasm related pain (acute) (chronic): Secondary | ICD-10-CM

## 2024-07-12 DIAGNOSIS — Z515 Encounter for palliative care: Secondary | ICD-10-CM

## 2024-07-12 DIAGNOSIS — C189 Malignant neoplasm of colon, unspecified: Secondary | ICD-10-CM

## 2024-07-12 DIAGNOSIS — G4709 Other insomnia: Secondary | ICD-10-CM

## 2024-07-12 MED ORDER — OXYCODONE HCL 5 MG PO TABS
5.0000 mg | ORAL_TABLET | ORAL | 0 refills | Status: DC | PRN
  Filled 2024-07-12: qty 60, 10d supply, fill #0

## 2024-07-12 MED ORDER — LORAZEPAM 0.5 MG PO TABS
0.5000 mg | ORAL_TABLET | Freq: Three times a day (TID) | ORAL | 0 refills | Status: DC | PRN
  Filled 2024-07-12: qty 30, 10d supply, fill #0

## 2024-07-13 ENCOUNTER — Other Ambulatory Visit (HOSPITAL_COMMUNITY): Payer: Self-pay

## 2024-07-13 ENCOUNTER — Other Ambulatory Visit: Payer: Self-pay

## 2024-07-17 NOTE — Patient Instructions (Signed)
 SURGICAL WAITING ROOM VISITATION Patients having surgery or a procedure may have no more than 2 support people in the waiting area - these visitors may rotate in the visitor waiting room.   If the patient needs to stay at the hospital during part of their recovery, the visitor guidelines for inpatient rooms apply.  PRE-OP VISITATION  Pre-op nurse will coordinate an appropriate time for 1 support person to accompany the patient in pre-op.  This support person may not rotate.  This visitor will be contacted when the time is appropriate for the visitor to come back in the pre-op area.  Temporary Visitor Restrictions   Children ages 28 and under will not be able to visit patients in [facility] under most circumstances. Visitation is not restricted outside of hospitals unless noted otherwise in the [facility] Specific Visitation Guidelines at :       [email or web address].nixon.[email or web address]/.  Visitors with respiratory illnesses are discouraged from visiting and should remain at home.  You are not required to quarantine at this time prior to your surgery. However, you must do this: Hand Hygiene often Do NOT share personal items Notify your provider if you are in close contact with someone who has COVID or you develop fever 100.4 or greater, new onset of sneezing, cough, sore throat, shortness of breath or body aches.  If you test positive for Covid or have been in contact with anyone that has tested positive in the last 10 days please notify you surgeon.    Your procedure is scheduled on:  Wednesday  July 28, 2024  Report to [facility] Main Entrance: [address] entrance where the [employer] is available.   Report to admitting at: 06:15    AM  Call this number if you have any questions or problems the morning of surgery [phone]  FOLLOW ANY ADDITIONAL PRE OP INSTRUCTIONS YOU RECEIVED FROM YOUR SURGEON'S OFFICE!!!  Dulcolax 20 mg (total) - Take 4 (four) of the 5 mg Dulcolax  tablets with water  at 07:00 am the day prior to surgery.  Miralax  238 g - Mix with 64 oz Gatorade/Powerade.  Starting at 10:00 am ,Drink this gradually over the next few hours (8 oz glass every 15-30 minutes) until gone the day prior to surgery You should finish in 4 hours-6 hours.    Neomycin  1000 mg - At 2 pm, 3 pm and 10 pm after Miralax   bowel prep the day prior to surgery.  Metronidazole  1000 mg - At 2 pm, 3 pm and 10 pm after Miralax  bowel prep the day prior to surgery.   Ondansetron  (Zofran ) can be used if you have any nausea from the other bowel prep medications.  Drink plenty of clear liquids all evening to avoid getting dehydrated.   DRINK two (2) bottles of Pre-Surgery Clear Ensure drink starting at 6:00 pm the evening prior to your surgery to help prevent dehydration. Increase drinking clear fluids (see list below)          Do not eat food after Midnight the night prior to your surgery/procedure.  After Midnight you may have the following liquids until   05:30 AM DAY OF SURGERY  Clear Liquid Diet Water  Black Coffee (sugar ok, NO MILK/CREAM OR CREAMERS)  Tea (sugar ok, NO MILK/CREAM OR CREAMERS) regular and decaf                             Plain Jell-O  with  no fruit (NO RED)                                           Fruit ices (not with fruit pulp, NO RED)                                     Popsicles (NO RED)                                                                  Juice: NO CITRUS JUICES: only apple, WHITE grape, WHITE cranberry Sports drinks like Gatorade or Powerade (NO RED)                   The day of surgery:  Drink ONE (1) Pre-Surgery Clear Ensure at  05:30 AM the morning of surgery. Drink in one sitting. Do not sip.  This drink was given to you during your hospital pre-op appointment visit. Nothing else to drink after completing the Pre-Surgery Clear Ensure : No candy, chewing gum or throat lozenges.     Oral Hygiene is also important to reduce your  risk of infection.        Remember - BRUSH YOUR TEETH THE MORNING OF SURGERY WITH YOUR REGULAR TOOTHPASTE  Do NOT smoke after Midnight the night before surgery.  STOP TAKING all Vitamins, Herbs and supplements 1 week before your surgery.   Take ONLY these medicines the morning of surgery with A SIP OF WATER : Amlodipine   and you may take Lorazepam  and Oxycodone  IR if needed.  You may use your Albuterol  inhaler if needed. Please bring your Inhaler with you on the day of your surgery.                     You may not have any metal on your body including hair pins, jewelry, and body piercing  Do not wear make-up, lotions, powders, perfumes or deodorant  Do not wear nail polish including gel and S&S, artificial / acrylic nails, or any other type of covering on natural nails including finger and toenails. If you have artificial nails, gel coating, etc., that needs to be removed by a nail salon, Please have this removed prior to surgery. Not doing so may mean that your surgery could be cancelled or delayed if the Surgeon or anesthesia staff feels like they are unable to monitor you safely.   Do not shave 48 hours prior to surgery to avoid nicks in your skin which may contribute to postoperative infections.   Contacts, Hearing Aids, dentures or bridgework may not be worn into surgery. DENTURES WILL BE REMOVED PRIOR TO SURGERY PLEASE DO NOT APPLY Poly grip OR ADHESIVES!!!  You may bring a small overnight bag with you on the day of surgery, only pack items that are not valuable. [facility] IS NOT RESPONSIBLE   FOR VALUABLES THAT ARE LOST OR STOLEN.   Do not bring your home medications to the hospital. The Pharmacy will dispense medications listed on your medication list to you during your admission in the Hospital.  Please read over the following fact sheets you were given: IF YOU HAVE QUESTIONS ABOUT YOUR PRE-OP INSTRUCTIONS, PLEASE CALL [phone]    [facility] - Preparing for Surgery         Before surgery, you can play an important role.  Because skin is not sterile, your skin needs to be as free of germs as possible.  You can reduce the number of germs on your skin by washing with CHG (chlorahexidine gluconate) soap before surgery.  CHG is an antiseptic cleaner which kills germs and bonds with the skin to continue killing germs even after washing. Please DO NOT use if you have an allergy to CHG or antibacterial soaps.  If your skin becomes reddened/irritated stop using the CHG and inform your nurse when you arrive at Short Stay. Do not shave (including legs and underarms) for at least 48 hours prior to the first CHG shower.  You may shave your face/neck.  Please follow these instructions carefully:  1.  Shower with CHG Soap the night before surgery ONLY (DO NOT USE THE CHG SOAP THE MORNING OF SURGERY).  2.  If you choose to wash your hair, wash your hair first as usual with your normal  shampoo.  3.  After you shampoo, rinse your hair and body thoroughly to remove the shampoo.                             4.  Use CHG as you would any other liquid soap.  You can apply chg directly to the skin and wash.  Gently with a scrungie or clean washcloth.  5.  Apply the CHG Soap to your body ONLY FROM THE NECK DOWN.   Do not use on face/ open                           Wound or open sores. Avoid contact with eyes, ears mouth and genitals (private parts).                       Wash face,  Genitals (private parts) with your normal soap.             6.  Wash thoroughly, paying special attention to the area where your  surgery  will be performed.  7.  Thoroughly rinse your body with warm water  from the neck down.  8.  DO NOT shower/wash with your normal soap after using and rinsing off the CHG Soap.                9.  Pat yourself dry with a clean towel.            10.  Wear clean pajamas.            11.  Place clean sheets on your bed the night of your first shower and do not  sleep with  pets.  Day of Surgery : Do not apply any CHG, lotions/deodorants the morning of surgery.  Please wear clean clothes to the hospital/surgery center.   FAILURE TO FOLLOW THESE INSTRUCTIONS MAY RESULT IN THE CANCELLATION OF YOUR SURGERY  PATIENT SIGNATURE_________________________________  NURSE SIGNATURE__________________________________  ________________________________________________________________________       Carol Mack    An incentive spirometer is a tool that can help keep your lungs clear and active. This tool measures how well you are filling your lungs with each  breath. Taking long deep breaths may help reverse or decrease the chance of developing breathing (pulmonary) problems (especially infection) following: A long period of time when you are unable to move or be active. BEFORE THE PROCEDURE  If the spirometer includes an indicator to show your best effort, your nurse or respiratory therapist will set it to a desired goal. If possible, sit up straight or lean slightly forward. Try not to slouch. Hold the incentive spirometer in an upright position. INSTRUCTIONS FOR USE  Sit on the edge of your bed if possible, or sit up as far as you can in bed or on a chair. Hold the incentive spirometer in an upright position. Breathe out normally. Place the mouthpiece in your mouth and seal your lips tightly around it. Breathe in slowly and as deeply as possible, raising the piston or the ball toward the top of the column. Hold your breath for 3-5 seconds or for as long as possible. Allow the piston or ball to fall to the bottom of the column. Remove the mouthpiece from your mouth and breathe out normally. Rest for a few seconds and repeat Steps 1 through 7 at least 10 times every 1-2 hours when you are awake. Take your time and take a few normal breaths between deep breaths. The spirometer may include an indicator to show your best effort. Use the indicator as a goal to  work toward during each repetition. After each set of 10 deep breaths, practice coughing to be sure your lungs are clear. If you have an incision (the cut made at the time of surgery), support your incision when coughing by placing a pillow or rolled up towels firmly against it. Once you are able to get out of bed, walk around indoors and cough well. You may stop using the incentive spirometer when instructed by your caregiver.  RISKS AND COMPLICATIONS Take your time so you do not get dizzy or light-headed. If you are in pain, you may need to take or ask for pain medication before doing incentive spirometry. It is harder to take a deep breath if you are having pain. AFTER USE Rest and breathe slowly and easily. It can be helpful to keep track of a log of your progress. Your caregiver can provide you with a simple table to help with this. If you are using the spirometer at home, follow these instructions: SEEK MEDICAL CARE IF:  You are having difficultly using the spirometer. You have trouble using the spirometer as often as instructed. Your pain medication is not giving enough relief while using the spirometer. You develop fever of 100.5 F (38.1 C) or higher.                                                                                                    SEEK IMMEDIATE MEDICAL CARE IF:  You cough up bloody sputum that had not been present before. You develop fever of 102 F (38.9 C) or greater. You develop worsening pain at or near the incision site. MAKE SURE YOU:  Understand these instructions. Will watch your  condition. Will get help right away if you are not doing well or get worse. Document Released: 11/11/2006 Document Revised: 09/23/2011 Document Reviewed: 01/12/2007 [facility] Patient Information 2014 [place], [place].       WHAT IS A BLOOD TRANSFUSION? Blood Transfusion Information  A transfusion is the replacement of blood or some of its parts. Blood is made up of  multiple cells which provide different functions. Red blood cells carry oxygen and are used for blood loss replacement. White blood cells fight against infection. Platelets control bleeding. Plasma helps clot blood. Other blood products are available for specialized needs, such as hemophilia or other clotting disorders. BEFORE THE TRANSFUSION  Who gives blood for transfusions?  Healthy volunteers who are fully evaluated to make sure their blood is safe. This is blood bank blood. Transfusion therapy is the safest it has ever been in the practice of medicine. Before blood is taken from a donor, a complete history is taken to make sure that person has no history of diseases nor engages in risky social behavior (examples are intravenous drug use or sexual activity with multiple partners). The donor's travel history is screened to minimize risk of transmitting infections, such as malaria. The donated blood is tested for signs of infectious diseases, such as HIV and hepatitis. The blood is then tested to be sure it is compatible with you in order to minimize the chance of a transfusion reaction. If you or a relative donates blood, this is often done in anticipation of surgery and is not appropriate for emergency situations. It takes many days to process the donated blood. RISKS AND COMPLICATIONS Although transfusion therapy is very safe and saves many lives, the main dangers of transfusion include:  Getting an infectious disease. Developing a transfusion reaction. This is an allergic reaction to something in the blood you were given. Every precaution is taken to prevent this. The decision to have a blood transfusion has been considered carefully by your caregiver before blood is given. Blood is not given unless the benefits outweigh the risks. AFTER THE TRANSFUSION Right after receiving a blood transfusion, you will usually feel much better and more energetic. This is especially true if your red blood  cells have gotten low (anemic). The transfusion raises the level of the red blood cells which carry oxygen, and this usually causes an energy increase. The nurse administering the transfusion will monitor you carefully for complications. HOME CARE INSTRUCTIONS  No special instructions are needed after a transfusion. You may find your energy is better. Speak with your caregiver about any limitations on activity for underlying diseases you may have. SEEK MEDICAL CARE IF:  Your condition is not improving after your transfusion. You develop redness or irritation at the intravenous (IV) site. SEEK IMMEDIATE MEDICAL CARE IF:  Any of the following symptoms occur over the next 12 hours: Shaking chills. You have a temperature by mouth above 102 F (38.9 C), not controlled by medicine. Chest, back, or muscle pain. People around you feel you are not acting correctly or are confused. Shortness of breath or difficulty breathing. Dizziness and fainting. You get a rash or develop hives. You have a decrease in urine output. Your urine turns a dark color or changes to pink, red, or brown. Any of the following symptoms occur over the next 10 days: You have a temperature by mouth above 102 F (38.9 C), not controlled by medicine. Shortness of breath. Weakness after normal activity. The white part of the eye turns yellow (jaundice). You  have a decrease in the amount of urine or are urinating less often. Your urine turns a dark color or changes to pink, red, or brown. Document Released: 06/28/2000 Document Revised: 09/23/2011 Document Reviewed: 02/15/2008 [facility] Patient Information 2014 [place], [place].  _______________________________________________________________________

## 2024-07-17 NOTE — Progress Notes (Signed)
 COVID Vaccine received:  []  No [x]  Yes Date of any COVID positive Test in last 90 days:  PCP - Rosaline Bohr. M{  Cardiologist -  Oncology- Onita Mattock, <D   Chest x-ray - 09-19-2023  2v  Epic EKG -  09-22-2023  Epic Stress Test -  ECHO -  Cardiac Cath -  CT Coronary Calcium  score:   Pacemaker / ICD device [x]  No []  Yes   Spinal Cord Stimulator:[x]  No []  Yes       History of Sleep Apnea? [x]  No []  Yes   CPAP used?- [x]  No []  Yes    Medication on DOS: Amlodipine  Albuterol  prn,  Lorazepam  Prn, Oxycodone  IR prn  Patient has: [x]  NO Hx DM   []  Pre-DM   []  DM1  []   DM2 Does the patient monitor blood sugar?   [x]  N/A   []  No []  Yes   Blood Thinner / Instructions:  none Aspirin Instructions:  none  Activity level: Able to walk up 2 flights of stairs without becoming significantly short of breath or having chest pain?   []    Yes   []  No,  would have:  Patient can perform ADLs without assistance.  []   Yes    []  No   Comments:   Anesthesia review: HTN, hx ILD - Enlarged pulmonic trunk indicative of pulmonary HTN and emphysema seen on CT chest, Port-a-Cath in place,  Patient denies any S&S of respiratory illness or Covid - no shortness of breath, fever, cough or chest pain at PAT appointment.  Patient verbalized understanding and agreement to the Pre-Surgical Instructions that were given to them at this PAT appointment. Patient was also educated of the need to review these PAT instructions again prior to her surgery.I reviewed the appropriate phone numbers to call if they have any and questions or concerns.

## 2024-07-19 ENCOUNTER — Other Ambulatory Visit: Payer: Self-pay

## 2024-07-19 ENCOUNTER — Encounter (INDEPENDENT_AMBULATORY_CARE_PROVIDER_SITE_OTHER): Payer: Self-pay | Admitting: Primary Care

## 2024-07-19 ENCOUNTER — Other Ambulatory Visit (HOSPITAL_COMMUNITY): Payer: Self-pay

## 2024-07-19 ENCOUNTER — Ambulatory Visit (INDEPENDENT_AMBULATORY_CARE_PROVIDER_SITE_OTHER): Admitting: Primary Care

## 2024-07-19 ENCOUNTER — Encounter (HOSPITAL_COMMUNITY)
Admission: RE | Admit: 2024-07-19 | Discharge: 2024-07-19 | Disposition: A | Discharge status: Home / Self Care | Source: Ambulatory Visit | Attending: General Surgery | Admitting: General Surgery

## 2024-07-19 ENCOUNTER — Encounter (HOSPITAL_COMMUNITY): Payer: Self-pay

## 2024-07-19 VITALS — BP 138/88 | HR 87 | Resp 16 | Ht 64.0 in | Wt 127.0 lb

## 2024-07-19 VITALS — BP 133/92 | HR 80 | Temp 97.9°F | Resp 18 | Ht 64.0 in | Wt 125.0 lb

## 2024-07-19 DIAGNOSIS — Z01812 Encounter for preprocedural laboratory examination: Secondary | ICD-10-CM | POA: Insufficient documentation

## 2024-07-19 DIAGNOSIS — C189 Malignant neoplasm of colon, unspecified: Secondary | ICD-10-CM | POA: Diagnosis not present

## 2024-07-19 DIAGNOSIS — I10 Essential (primary) hypertension: Secondary | ICD-10-CM

## 2024-07-19 DIAGNOSIS — Z760 Encounter for issue of repeat prescription: Secondary | ICD-10-CM | POA: Diagnosis not present

## 2024-07-19 DIAGNOSIS — Z01818 Encounter for other preprocedural examination: Secondary | ICD-10-CM | POA: Diagnosis present

## 2024-07-19 DIAGNOSIS — C772 Secondary and unspecified malignant neoplasm of intra-abdominal lymph nodes: Secondary | ICD-10-CM | POA: Insufficient documentation

## 2024-07-19 HISTORY — DX: Myoneural disorder, unspecified: G70.9

## 2024-07-19 HISTORY — DX: Anxiety disorder, unspecified: F41.9

## 2024-07-19 HISTORY — DX: Malignant (primary) neoplasm, unspecified: C80.1

## 2024-07-19 LAB — COMPREHENSIVE METABOLIC PANEL WITH GFR
ALT: <5 U/L (ref 0–44)
AST: 17 U/L (ref 15–41)
Albumin: 3.9 g/dL (ref 3.5–5.0)
Alkaline Phosphatase: 79 U/L (ref 38–126)
Anion gap: 11 (ref 5–15)
BUN: 9 mg/dL (ref 6–20)
CO2: 25 mmol/L (ref 22–32)
Calcium: 9.4 mg/dL (ref 8.9–10.3)
Chloride: 104 mmol/L (ref 98–111)
Creatinine, Ser: 0.73 mg/dL (ref 0.44–1.00)
GFR, Estimated: >60 mL/min
Glucose, Bld: 118 mg/dL — ABNORMAL HIGH (ref 70–99)
Potassium: 3.7 mmol/L (ref 3.5–5.1)
Sodium: 140 mmol/L (ref 135–145)
Total Bilirubin: 0.3 mg/dL (ref 0.0–1.2)
Total Protein: 7.7 g/dL (ref 6.5–8.1)

## 2024-07-19 LAB — CBC
HCT: 38.6 % (ref 36.0–46.0)
Hemoglobin: 13.2 g/dL (ref 12.0–15.0)
MCH: 24 pg — ABNORMAL LOW (ref 26.0–34.0)
MCHC: 34.2 g/dL (ref 30.0–36.0)
MCV: 70.2 fL — ABNORMAL LOW (ref 80.0–100.0)
Platelets: 373 K/uL (ref 150–400)
RBC: 5.5 MIL/uL — ABNORMAL HIGH (ref 3.87–5.11)
RDW: 15.6 % — ABNORMAL HIGH (ref 11.5–15.5)
WBC: 5.9 K/uL (ref 4.0–10.5)
nRBC: 0 % (ref 0.0–0.2)

## 2024-07-19 MED ORDER — AMLODIPINE BESYLATE 10 MG PO TABS
10.0000 mg | ORAL_TABLET | Freq: Every day | ORAL | 1 refills | Status: AC
  Filled 2024-07-19 – 2024-08-11 (×2): qty 90, 90d supply, fill #0

## 2024-07-19 NOTE — Progress Notes (Signed)
 " [facility]   Carol Mack is a 54 y.o. female presents for hypertension evaluation, Denies shortness of breath, headaches, chest pain or lower extremity edema, sudden onset, vision changes, unilateral weakness, dizziness, paresthesias   Patient reports adherence with medications.  Past Medical History:  Diagnosis Date   Anemia    Anxiety    Asthma    Bartholin cyst    Cancer (HCC)    recurrent colon cancer   Chiari malformation type I (HCC)    Hernia, inguinal    Hernia, inguinal    Hypertension    ILD (interstitial lung disease) (HCC)    Neuromuscular disorder (HCC)    neuropathy from chemo in hands and feet   Past Surgical History:  Procedure Laterality Date   fibroid freezing     unsure   IR GENERIC HISTORICAL  07/27/2014   IR RADIOLOGIST EVAL & MGMT 07/27/2014 Juliene Balder, MD [facility] INTERV RAD   IR PORT REPAIR CENTRAL VENOUS ACCESS DEVICE  10/06/2023   PORTACATH PLACEMENT N/A 07/31/2023   Procedure: PLACEMENT OF PORT A CATHETER UNDER ULTRASOUND AND FLUOROSCOPIC GUIDANCE;  Surgeon: Sheldon Standing, MD;  Location: [facility];  Service: General;  Laterality: N/A;  GEN WITH ERAS 1.5 HOURS TOTAL   ROBOTIC ASSISTED TOTAL HYSTERECTOMY N/A 09/15/2018   Procedure: XI ROBOTIC ASSISTED TOTAL HYSTERECTOMY WITH SALPINGECTOMY;  Surgeon: Corene Coy, MD;  Location: [facility];  Service: Gynecology;  Laterality: N/A;   Allergies[1] Medications Ordered Prior to Encounter[2] Social History   Socioeconomic History   Marital status: Single    Spouse name: Not on file   Number of children: 2   Years of education: Not on file   Highest education level: Not on file  Occupational History   Not on file  Tobacco Use   Smoking status: Former    Current packs/day: 1.00    Average packs/day: 1 pack/day for 30.0 years (30.0 ttl pk-yrs)    Types: Cigarettes    Start date: 50    Passive exposure: Current   Smokeless tobacco: Never  Vaping Use   Vaping  status: Never Used  Substance and Sexual Activity   Alcohol use: No   Drug use: No   Sexual activity: Not Currently    Birth control/protection: Surgical  Other Topics Concern   Not on file  Social History Narrative   Not on file   Social Drivers of Health   Tobacco Use: Medium Risk (07/19/2024)   Patient History    Smoking Tobacco Use: Former    Smokeless Tobacco Use: Never    Passive Exposure: Current  Physicist, Medical Strain: Low Risk (07/19/2024)   Overall Financial Resource Strain (CARDIA)    Difficulty of Paying Living Expenses: Not very hard  Food Insecurity: No Food Insecurity (07/19/2024)   Epic    Worried About Radiation Protection Practitioner of Food in the Last Year: Never true    Ran Out of Food in the Last Year: Never true  Transportation Needs: No Transportation Needs (07/19/2024)   Epic    Lack of Transportation (Medical): No    Lack of Transportation (Non-Medical): No  Physical Activity: Sufficiently Active (07/19/2024)   Exercise Vital Sign    Days of Exercise per Week: 7 days    Minutes of Exercise per Session: 30 min  Stress: No Stress Concern Present (07/19/2024)   [employer] of Occupational Health - Occupational Stress Questionnaire    Feeling of Stress: Only a little  Social Connections: Moderately Isolated (07/19/2024)  Social Connection and Isolation Panel    Frequency of Communication with Friends and Family: More than three times a week    Frequency of Social Gatherings with Friends and Family: More than three times a week    Attends Religious Services: More than 4 times per year    Active Member of [employer] or Organizations: No    Attends Banker Meetings: Not on file    Marital Status: Widowed  Intimate Partner Violence: Not At Risk (07/19/2024)   Epic    Fear of Current or Ex-Partner: No    Emotionally Abused: No    Physically Abused: No    Sexually Abused: No  Depression (PHQ2-9): Low Risk (02/10/2024)   Depression (PHQ2-9)    PHQ-2 Score: 0  Alcohol  Screen: Not on file  Housing: Low Risk (07/19/2024)   Epic    Unable to Pay for Housing in the Last Year: No    Number of Times Moved in the Last Year: 0    Homeless in the Last Year: No  Utilities: Not At Risk (07/19/2024)   Epic    Threatened with loss of utilities: No  Health Literacy: Not on file   Family History  Problem Relation Age of Onset   Thyroid  disease Mother        large benign goiter   Cancer Maternal Uncle        UNKNOWN TYPE CANCER   Colon cancer Neg Hx    Esophageal cancer Neg Hx    Rectal cancer Neg Hx    Stomach cancer Neg Hx    Health Maintenance  Topic Date Due   Pneumococcal Vaccine: 50+ Years (1 of 2 - PCV) Never done   Hepatitis B Vaccines 19-59 Average Risk (1 of 3 - 19+ 3-dose series) Never done   Zoster Vaccines- Shingrix (1 of 2) Never done   COVID-19 Vaccine (2 - Pfizer risk series) 11/25/2019   Mammogram  08/13/2020   Influenza Vaccine  02/13/2024   Lung Cancer Screening  09/18/2024   DTaP/Tdap/Td (2 - Td or Tdap) 09/27/2029   Colonoscopy  05/20/2034   Hepatitis C Screening  Completed   HIV Screening  Completed   HPV VACCINES  Aged Out   Meningococcal B Vaccine  Aged Out     OBJECTIVE:  Vitals:   07/19/24 1152  BP: 138/88  Pulse: 87  Resp: 16  SpO2: 98%  Weight: 127 lb (57.6 kg)  Height: 5' 4 (1.626 m)    Physical Exam Vitals reviewed.  Constitutional:      Appearance: Normal appearance. She is normal weight.  HENT:     Head: Normocephalic.     Right Ear: External ear normal.     Left Ear: External ear normal.     Nose: Nose normal.     Mouth/Throat:     Mouth: Mucous membranes are moist.  Eyes:     Extraocular Movements: Extraocular movements intact.  Cardiovascular:     Rate and Rhythm: Normal rate and regular rhythm.  Pulmonary:     Effort: Pulmonary effort is normal.     Breath sounds: Normal breath sounds.  Abdominal:     General: Bowel sounds are normal.     Palpations: Abdomen is soft.  Musculoskeletal:         General: Normal range of motion.     Cervical back: Normal range of motion.  Skin:    General: Skin is warm and dry.  Neurological:     Mental  Status: She is alert and oriented to person, place, and time.  Psychiatric:        Mood and Affect: Mood normal.        Behavior: Behavior normal.        Thought Content: Thought content normal.      ROS  Last 3 Office BP readings: BP Readings from Last 3 Encounters:  07/19/24 138/88  07/19/24 (!) 133/92  05/25/24 (!) 149/108    BMET    Component Value Date/Time   NA 140 07/19/2024 0952   K 3.7 07/19/2024 0952   CL 104 07/19/2024 0952   CO2 25 07/19/2024 0952   GLUCOSE 118 (H) 07/19/2024 0952   BUN 9 07/19/2024 0952   CREATININE 0.73 07/19/2024 0952   CREATININE 0.72 01/13/2024 1029   CALCIUM  9.4 07/19/2024 0952   GFRNONAA >60 07/19/2024 0952   GFRNONAA >60 01/13/2024 1029   GFRAA >60 06/09/2019 1613    Renal function: Estimated Creatinine Clearance: 70.2 mL/min (by C-G formula based on SCr of 0.73 mg/dL).  Clinical ASCVD: No  The ASCVD Risk score (Arnett DK, et al., 2019) failed to calculate for the following reasons:   Cannot find a previous HDL lab   Cannot find a previous total cholesterol lab   * - Cholesterol units were assumed  ASCVD risk factors include- CHAD   ASSESSMENT & PLAN: Carol Mack was seen today for hypertension.  Diagnoses and all orders for this visit:  Essential hypertension  -Counseled on lifestyle modifications for blood pressure control including reduced dietary sodium, increased exercise, weight reduction and adequate sleep. Also, educated patient about the risk for cardiovascular events, stroke and heart attack. Also counseled patient about the importance of medication adherence. If you participate in smoking, it is important to stop using tobacco as this will increase the risks associated with uncontrolled blood pressure.  Goal BP:  For patients younger than 60: Goal BP < 130/80. For  patients 60 and older: Goal BP < 140/90. For patients with diabetes: Goal BP < 130/80. Your most recent BP: 138/88  Minimize salt intake. Minimize alcohol intake  Medication refill  amLODipine  (NORVASC ) 10 MG tablet; Take 1 tablet (10 mg total) by mouth daily.   This note has been created with Education officer, environmental. Any transcriptional errors are unintentional.   Rosaline SHAUNNA Bohr, NP 07/19/2024, 12:01 PM       [1]  Allergies Allergen Reactions   Hydrocodone -Acetaminophen  Other (See Comments)   Hydrocodone  Other (See Comments)    Made the patient jittery  [2]  Current Outpatient Medications on File Prior to Visit  Medication Sig Dispense Refill   albuterol  (VENTOLIN  HFA) 108 (90 Base) MCG/ACT inhaler Inhale 1-2 puffs into the lungs every 6 (six) hours as needed for shortness of breath. 2 each 1   amLODipine  (NORVASC ) 10 MG tablet Take 1 tablet (10 mg total) by mouth daily. 30 tablet 2   bisacodyl  5 MG EC tablet Take 4 tablets (20 mg total) by mouth once daily as needed for Constipation for up to 1 dose 4 tablet 0   dronabinol  (MARINOL ) 5 MG capsule Take 1 capsule (5 mg total) by mouth 2 (two) times daily before lunch and supper. (Patient not taking: No sig reported) 60 capsule 2   ferrous sulfate  325 (65 FE) MG tablet Take 1 tablet (325 mg total) by mouth daily with breakfast. 90 tablet 0   lidocaine -prilocaine  (EMLA ) cream Apply to affected area once (Patient taking differently: Apply  1 Application topically as needed (for port access).) 30 g 3   LORazepam  (ATIVAN ) 0.5 MG tablet Take 1 tablet (0.5 mg total) by mouth every 8 (eight) hours as needed for anxiety. 30 tablet 0   metroNIDAZOLE  (FLAGYL ) 500 MG tablet Take 2 tablets (1,000 mg total) by mouth 3 (three) times daily for 3 doses Take according to your procedure colon prep instructions 6 tablet 0   neomycin  (MYCIFRADIN ) 500 MG tablet Take 2 tablets (1,000 mg total) by mouth 3 (three)  times daily for 3 doses Take according to your procedure colon prep instructions 6 tablet 0   ondansetron  (ZOFRAN ) 4 MG tablet Take 1 tablet (4 mg total) by mouth 2 (two) times daily Take 1 tablet at 3pm and then 1 tablet at 10pm 2 tablet 0   oxyCODONE  (OXY IR/ROXICODONE ) 5 MG immediate release tablet Take 1 tablet (5 mg total) by mouth every 4 (four) hours as needed for severe pain (pain score 7-10). 60 tablet 0   polyethylene glycol powder (GLYCOLAX /MIRALAX ) 17 GM/SCOOP powder Take by mouth once for 1 dose Take according to your procedure prep instructions. 238 g 0   No current facility-administered medications on file prior to visit.   "

## 2024-07-20 ENCOUNTER — Encounter (HOSPITAL_COMMUNITY)

## 2024-07-22 ENCOUNTER — Other Ambulatory Visit (HOSPITAL_COMMUNITY): Payer: Self-pay

## 2024-07-22 MED ORDER — IBUPROFEN 800 MG PO TABS
800.0000 mg | ORAL_TABLET | Freq: Four times a day (QID) | ORAL | 0 refills | Status: AC | PRN
  Filled 2024-07-22: qty 16, 4d supply, fill #0

## 2024-07-28 ENCOUNTER — Encounter (HOSPITAL_COMMUNITY)
Admission: RE | Disposition: A | Discharge status: Home / Self Care | Payer: Self-pay | Source: Home / Self Care | Attending: General Surgery

## 2024-07-28 ENCOUNTER — Encounter (HOSPITAL_COMMUNITY): Payer: Self-pay | Admitting: General Surgery

## 2024-07-28 ENCOUNTER — Inpatient Hospital Stay (HOSPITAL_COMMUNITY)
Admission: RE | Admit: 2024-07-28 | Discharge: 2024-07-31 | DRG: 330 | Disposition: A | Discharge status: Home / Self Care | Attending: General Surgery | Admitting: General Surgery

## 2024-07-28 ENCOUNTER — Inpatient Hospital Stay (HOSPITAL_COMMUNITY): Admitting: Certified Registered"

## 2024-07-28 ENCOUNTER — Other Ambulatory Visit: Payer: Self-pay

## 2024-07-28 DIAGNOSIS — C772 Secondary and unspecified malignant neoplasm of intra-abdominal lymph nodes: Secondary | ICD-10-CM | POA: Diagnosis present

## 2024-07-28 DIAGNOSIS — J45909 Unspecified asthma, uncomplicated: Secondary | ICD-10-CM | POA: Diagnosis present

## 2024-07-28 DIAGNOSIS — I10 Essential (primary) hypertension: Secondary | ICD-10-CM

## 2024-07-28 DIAGNOSIS — C189 Malignant neoplasm of colon, unspecified: Principal | ICD-10-CM | POA: Diagnosis present

## 2024-07-28 DIAGNOSIS — Z515 Encounter for palliative care: Secondary | ICD-10-CM

## 2024-07-28 DIAGNOSIS — Z87891 Personal history of nicotine dependence: Secondary | ICD-10-CM

## 2024-07-28 DIAGNOSIS — Z8249 Family history of ischemic heart disease and other diseases of the circulatory system: Secondary | ICD-10-CM

## 2024-07-28 DIAGNOSIS — Z885 Allergy status to narcotic agent status: Secondary | ICD-10-CM

## 2024-07-28 DIAGNOSIS — G893 Neoplasm related pain (acute) (chronic): Secondary | ICD-10-CM

## 2024-07-28 DIAGNOSIS — K660 Peritoneal adhesions (postprocedural) (postinfection): Secondary | ICD-10-CM | POA: Diagnosis present

## 2024-07-28 DIAGNOSIS — J849 Interstitial pulmonary disease, unspecified: Secondary | ICD-10-CM | POA: Diagnosis present

## 2024-07-28 DIAGNOSIS — K439 Ventral hernia without obstruction or gangrene: Secondary | ICD-10-CM | POA: Diagnosis present

## 2024-07-28 HISTORY — PX: EPIGASTRIC HERNIA REPAIR: SHX404

## 2024-07-28 LAB — TYPE AND SCREEN
ABO/RH(D): O POS
Antibody Screen: NEGATIVE

## 2024-07-28 MED ORDER — GABAPENTIN 300 MG PO CAPS
300.0000 mg | ORAL_CAPSULE | ORAL | Status: AC
  Administered 2024-07-28: 300 mg via ORAL
  Filled 2024-07-28: qty 1

## 2024-07-28 MED ORDER — DEXAMETHASONE SOD PHOSPHATE PF 10 MG/ML IJ SOLN
INTRAMUSCULAR | Status: DC | PRN
  Administered 2024-07-28: 4 mg via INTRAVENOUS

## 2024-07-28 MED ORDER — PROPOFOL 500 MG/50ML IV EMUL
INTRAVENOUS | Status: AC
  Filled 2024-07-28: qty 50

## 2024-07-28 MED ORDER — SACCHAROMYCES BOULARDII 250 MG PO CAPS
250.0000 mg | ORAL_CAPSULE | Freq: Two times a day (BID) | ORAL | Status: DC
  Administered 2024-07-28 – 2024-07-30 (×6): 250 mg via ORAL
  Filled 2024-07-28 (×6): qty 1

## 2024-07-28 MED ORDER — FENTANYL CITRATE (PF) 100 MCG/2ML IJ SOLN
INTRAMUSCULAR | Status: DC | PRN
  Administered 2024-07-28: 100 ug via INTRAVENOUS
  Administered 2024-07-28: 50 ug via INTRAVENOUS

## 2024-07-28 MED ORDER — ONDANSETRON HCL 4 MG/2ML IJ SOLN
4.0000 mg | Freq: Four times a day (QID) | INTRAMUSCULAR | Status: DC | PRN
  Administered 2024-07-29 – 2024-07-30 (×3): 4 mg via INTRAVENOUS
  Filled 2024-07-28 (×3): qty 2

## 2024-07-28 MED ORDER — ENOXAPARIN SODIUM 40 MG/0.4ML IJ SOSY
40.0000 mg | PREFILLED_SYRINGE | INTRAMUSCULAR | Status: DC
  Administered 2024-07-29 – 2024-07-30 (×2): 40 mg via SUBCUTANEOUS
  Filled 2024-07-28 (×3): qty 0.4

## 2024-07-28 MED ORDER — HYDROMORPHONE HCL 1 MG/ML IJ SOLN
0.5000 mg | INTRAMUSCULAR | Status: DC | PRN
  Administered 2024-07-28 – 2024-07-30 (×8): 0.5 mg via INTRAVENOUS
  Filled 2024-07-28 (×8): qty 0.5

## 2024-07-28 MED ORDER — BISACODYL 5 MG PO TBEC: 20.0000 mg | DELAYED_RELEASE_TABLET | Freq: Once | ORAL | Status: DC

## 2024-07-28 MED ORDER — BUPIVACAINE-EPINEPHRINE (PF) 0.25% -1:200000 IJ SOLN
INTRAMUSCULAR | Status: AC
  Filled 2024-07-28: qty 60

## 2024-07-28 MED ORDER — RINGERS IRRIGATION IR SOLN
Status: DC | PRN
  Administered 2024-07-28: 1000 mL

## 2024-07-28 MED ORDER — DIPHENHYDRAMINE HCL 50 MG/ML IJ SOLN: 12.5000 mg | Freq: Four times a day (QID) | INTRAMUSCULAR | Status: DC | PRN

## 2024-07-28 MED ORDER — DEXAMETHASONE SOD PHOSPHATE PF 10 MG/ML IJ SOLN
INTRAMUSCULAR | Status: AC
  Filled 2024-07-28: qty 1

## 2024-07-28 MED ORDER — STERILE WATER FOR IRRIGATION IR SOLN
Status: DC | PRN
  Administered 2024-07-28 (×2): 1000 mL

## 2024-07-28 MED ORDER — SODIUM CHLORIDE 0.9 % IV SOLN
2.0000 g | Freq: Two times a day (BID) | INTRAVENOUS | Status: AC
  Administered 2024-07-28: 2 g via INTRAVENOUS
  Filled 2024-07-28: qty 2

## 2024-07-28 MED ORDER — ALBUTEROL SULFATE HFA 108 (90 BASE) MCG/ACT IN AERS: 1.0000 | INHALATION_SPRAY | Freq: Four times a day (QID) | RESPIRATORY_TRACT | Status: DC | PRN

## 2024-07-28 MED ORDER — DIPHENHYDRAMINE HCL 12.5 MG/5ML PO ELIX: 12.5000 mg | ORAL_SOLUTION | Freq: Four times a day (QID) | ORAL | Status: DC | PRN

## 2024-07-28 MED ORDER — PROPOFOL 500 MG/50ML IV EMUL
INTRAVENOUS | Status: DC | PRN
  Administered 2024-07-28: 25 ug/kg/min via INTRAVENOUS

## 2024-07-28 MED ORDER — ALBUMIN HUMAN 5 % IV SOLN: INTRAVENOUS | Status: DC | PRN

## 2024-07-28 MED ORDER — LACTATED RINGERS IV SOLN: INTRAVENOUS | Status: DC

## 2024-07-28 MED ORDER — FENTANYL CITRATE (PF) 50 MCG/ML IJ SOSY
25.0000 ug | PREFILLED_SYRINGE | INTRAMUSCULAR | Status: DC | PRN
  Administered 2024-07-28 (×2): 50 ug via INTRAVENOUS

## 2024-07-28 MED ORDER — ALVIMOPAN 12 MG PO CAPS
12.0000 mg | ORAL_CAPSULE | ORAL | Status: AC
  Administered 2024-07-28: 12 mg via ORAL
  Filled 2024-07-28: qty 1

## 2024-07-28 MED ORDER — BISACODYL 5 MG PO TBEC
20.0000 mg | DELAYED_RELEASE_TABLET | Freq: Every day | ORAL | Status: DC
  Administered 2024-07-28 – 2024-07-30 (×3): 20 mg via ORAL
  Filled 2024-07-28 (×3): qty 4

## 2024-07-28 MED ORDER — PHENYLEPHRINE 80 MCG/ML (10ML) SYRINGE FOR IV PUSH (FOR BLOOD PRESSURE SUPPORT)
PREFILLED_SYRINGE | INTRAVENOUS | Status: AC
  Filled 2024-07-28: qty 10

## 2024-07-28 MED ORDER — OXYCODONE HCL 5 MG PO TABS
5.0000 mg | ORAL_TABLET | ORAL | Status: DC | PRN
  Administered 2024-07-28 – 2024-07-30 (×3): 10 mg via ORAL
  Administered 2024-07-31: 5 mg via ORAL
  Filled 2024-07-28 (×2): qty 2
  Filled 2024-07-28: qty 1
  Filled 2024-07-28: qty 2

## 2024-07-28 MED ORDER — PHENYLEPHRINE HCL (PRESSORS) 10 MG/ML IV SOLN
INTRAVENOUS | Status: AC
  Filled 2024-07-28: qty 1

## 2024-07-28 MED ORDER — ACETAMINOPHEN 500 MG PO TABS
1000.0000 mg | ORAL_TABLET | ORAL | Status: AC
  Administered 2024-07-28: 1000 mg via ORAL
  Filled 2024-07-28: qty 2

## 2024-07-28 MED ORDER — ENSURE PRE-SURGERY PO LIQD
296.0000 mL | Freq: Once | ORAL | Status: DC
  Filled 2024-07-28: qty 296

## 2024-07-28 MED ORDER — AMLODIPINE BESYLATE 10 MG PO TABS
10.0000 mg | ORAL_TABLET | Freq: Every day | ORAL | Status: DC
  Administered 2024-07-28 – 2024-07-30 (×3): 10 mg via ORAL
  Filled 2024-07-28 (×3): qty 1

## 2024-07-28 MED ORDER — ALBUMIN HUMAN 5 % IV SOLN
INTRAVENOUS | Status: AC
  Filled 2024-07-28: qty 250

## 2024-07-28 MED ORDER — ONDANSETRON HCL 4 MG PO TABS: 4.0000 mg | ORAL_TABLET | Freq: Two times a day (BID) | ORAL | Status: DC

## 2024-07-28 MED ORDER — OXYCODONE HCL 5 MG/5ML PO SOLN: 5.0000 mg | Freq: Once | ORAL | Status: DC | PRN

## 2024-07-28 MED ORDER — DRONABINOL 2.5 MG PO CAPS: 5.0000 mg | ORAL_CAPSULE | Freq: Two times a day (BID) | ORAL | Status: DC

## 2024-07-28 MED ORDER — POLYETHYLENE GLYCOL 3350 17 GM/SCOOP PO POWD: 238.0000 g | Freq: Once | ORAL | Status: DC

## 2024-07-28 MED ORDER — MIDAZOLAM HCL 2 MG/2ML IJ SOLN
INTRAMUSCULAR | Status: AC
  Filled 2024-07-28: qty 2

## 2024-07-28 MED ORDER — PROPOFOL 10 MG/ML IV BOLUS
INTRAVENOUS | Status: AC
  Filled 2024-07-28: qty 20

## 2024-07-28 MED ORDER — CHLORHEXIDINE GLUCONATE 0.12 % MT SOLN
15.0000 mL | Freq: Once | OROMUCOSAL | Status: AC
  Administered 2024-07-28: 15 mL via OROMUCOSAL

## 2024-07-28 MED ORDER — FERROUS SULFATE 325 (65 FE) MG PO TABS
325.0000 mg | ORAL_TABLET | Freq: Every day | ORAL | Status: DC
  Administered 2024-07-29 – 2024-07-31 (×3): 325 mg via ORAL
  Filled 2024-07-28 (×3): qty 1

## 2024-07-28 MED ORDER — ALBUTEROL SULFATE (2.5 MG/3ML) 0.083% IN NEBU: 2.5000 mg | INHALATION_SOLUTION | Freq: Four times a day (QID) | RESPIRATORY_TRACT | Status: DC | PRN

## 2024-07-28 MED ORDER — PROPOFOL 10 MG/ML IV BOLUS
INTRAVENOUS | Status: DC | PRN
  Administered 2024-07-28: 200 mg via INTRAVENOUS

## 2024-07-28 MED ORDER — ROCURONIUM BROMIDE 10 MG/ML (PF) SYRINGE
PREFILLED_SYRINGE | INTRAVENOUS | Status: DC | PRN
  Administered 2024-07-28: 40 mg via INTRAVENOUS
  Administered 2024-07-28: 60 mg via INTRAVENOUS

## 2024-07-28 MED ORDER — LIDOCAINE HCL (PF) 2 % IJ SOLN
INTRAMUSCULAR | Status: AC
  Filled 2024-07-28: qty 5

## 2024-07-28 MED ORDER — BUPIVACAINE-EPINEPHRINE 0.25% -1:200000 IJ SOLN
INTRAMUSCULAR | Status: DC | PRN
  Administered 2024-07-28: 60 mL

## 2024-07-28 MED ORDER — PHENYLEPHRINE 80 MCG/ML (10ML) SYRINGE FOR IV PUSH (FOR BLOOD PRESSURE SUPPORT)
PREFILLED_SYRINGE | INTRAVENOUS | Status: DC | PRN
  Administered 2024-07-28: 80 ug via INTRAVENOUS
  Administered 2024-07-28: 160 ug via INTRAVENOUS
  Administered 2024-07-28 (×3): 80 ug via INTRAVENOUS

## 2024-07-28 MED ORDER — PHENYLEPHRINE HCL-NACL 20-0.9 MG/250ML-% IV SOLN
INTRAVENOUS | Status: DC | PRN
  Administered 2024-07-28: 40 ug/min via INTRAVENOUS

## 2024-07-28 MED ORDER — ENSURE SURGERY PO LIQD: 237.0000 mL | Freq: Two times a day (BID) | ORAL | Status: DC

## 2024-07-28 MED ORDER — HEPARIN SODIUM (PORCINE) 5000 UNIT/ML IJ SOLN
5000.0000 [IU] | Freq: Once | INTRAMUSCULAR | Status: AC
  Administered 2024-07-28: 5000 [IU] via SUBCUTANEOUS
  Filled 2024-07-28: qty 1

## 2024-07-28 MED ORDER — ENSURE PRE-SURGERY PO LIQD: 592.0000 mL | Freq: Once | ORAL | Status: DC

## 2024-07-28 MED ORDER — ONDANSETRON HCL 4 MG/2ML IJ SOLN
INTRAMUSCULAR | Status: AC
  Filled 2024-07-28: qty 2

## 2024-07-28 MED ORDER — LIDOCAINE HCL (CARDIAC) PF 100 MG/5ML IV SOSY
PREFILLED_SYRINGE | INTRAVENOUS | Status: DC | PRN
  Administered 2024-07-28: 60 mg via INTRAVENOUS

## 2024-07-28 MED ORDER — ALVIMOPAN 12 MG PO CAPS
12.0000 mg | ORAL_CAPSULE | Freq: Two times a day (BID) | ORAL | Status: DC
  Administered 2024-07-29 – 2024-07-30 (×4): 12 mg via ORAL
  Filled 2024-07-28 (×4): qty 1

## 2024-07-28 MED ORDER — ROCURONIUM BROMIDE 10 MG/ML (PF) SYRINGE
PREFILLED_SYRINGE | INTRAVENOUS | Status: AC
  Filled 2024-07-28: qty 10

## 2024-07-28 MED ORDER — ONDANSETRON HCL 4 MG PO TABS: 4.0000 mg | ORAL_TABLET | Freq: Four times a day (QID) | ORAL | Status: DC | PRN

## 2024-07-28 MED ORDER — SIMETHICONE 80 MG PO CHEW: 40.0000 mg | CHEWABLE_TABLET | Freq: Four times a day (QID) | ORAL | Status: DC | PRN

## 2024-07-28 MED ORDER — ORAL CARE MOUTH RINSE: 15.0000 mL | Freq: Once | OROMUCOSAL | Status: AC

## 2024-07-28 MED ORDER — FENTANYL CITRATE (PF) 100 MCG/2ML IJ SOLN
INTRAMUSCULAR | Status: AC
  Filled 2024-07-28: qty 2

## 2024-07-28 MED ORDER — IBUPROFEN 400 MG PO TABS
400.0000 mg | ORAL_TABLET | ORAL | Status: DC | PRN
  Administered 2024-07-29 (×2): 400 mg via ORAL
  Filled 2024-07-28 (×2): qty 1

## 2024-07-28 MED ORDER — KETOROLAC TROMETHAMINE 30 MG/ML IJ SOLN: 30.0000 mg | Freq: Once | INTRAMUSCULAR | Status: DC | PRN

## 2024-07-28 MED ORDER — KETAMINE HCL 50 MG/5ML IJ SOSY
PREFILLED_SYRINGE | INTRAMUSCULAR | Status: DC | PRN
  Administered 2024-07-28 (×2): 10 mg via INTRAVENOUS

## 2024-07-28 MED ORDER — OXYCODONE HCL 5 MG PO TABS: 5.0000 mg | ORAL_TABLET | Freq: Once | ORAL | Status: DC | PRN

## 2024-07-28 MED ORDER — SUGAMMADEX SODIUM 200 MG/2ML IV SOLN
INTRAVENOUS | Status: DC | PRN
  Administered 2024-07-28: 200 mg via INTRAVENOUS

## 2024-07-28 MED ORDER — ALUM & MAG HYDROXIDE-SIMETH 200-200-20 MG/5ML PO SUSP: 30.0000 mL | Freq: Four times a day (QID) | ORAL | Status: DC | PRN

## 2024-07-28 MED ORDER — FENTANYL CITRATE (PF) 50 MCG/ML IJ SOSY
PREFILLED_SYRINGE | INTRAMUSCULAR | Status: AC
  Filled 2024-07-28: qty 2

## 2024-07-28 MED ORDER — KETAMINE HCL 50 MG/5ML IJ SOSY
PREFILLED_SYRINGE | INTRAMUSCULAR | Status: AC
  Filled 2024-07-28: qty 5

## 2024-07-28 MED ORDER — MIDAZOLAM HCL (PF) 2 MG/2ML IJ SOLN
INTRAMUSCULAR | Status: DC | PRN
  Administered 2024-07-28: 2 mg via INTRAVENOUS

## 2024-07-28 MED ORDER — GABAPENTIN 100 MG PO CAPS
300.0000 mg | ORAL_CAPSULE | Freq: Two times a day (BID) | ORAL | Status: DC
  Administered 2024-07-28 – 2024-07-29 (×3): 300 mg via ORAL
  Filled 2024-07-28 (×3): qty 3

## 2024-07-28 MED ORDER — KCL IN DEXTROSE-NACL 20-5-0.45 MEQ/L-%-% IV SOLN
INTRAVENOUS | Status: DC
  Filled 2024-07-28: qty 1000

## 2024-07-28 MED ORDER — AMISULPRIDE (ANTIEMETIC) 5 MG/2ML IV SOLN: 10.0000 mg | Freq: Once | INTRAVENOUS | Status: DC | PRN

## 2024-07-28 MED ORDER — ONDANSETRON HCL 4 MG/2ML IJ SOLN
INTRAMUSCULAR | Status: DC | PRN
  Administered 2024-07-28: 4 mg via INTRAVENOUS

## 2024-07-28 MED ORDER — SUGAMMADEX SODIUM 200 MG/2ML IV SOLN
INTRAVENOUS | Status: AC
  Filled 2024-07-28: qty 2

## 2024-07-28 MED ORDER — INDOCYANINE GREEN 25 MG IJ SOLR
INTRAMUSCULAR | Status: DC | PRN
  Administered 2024-07-28: 2.5 mg via INTRAVENOUS

## 2024-07-28 MED ORDER — LORAZEPAM 0.5 MG PO TABS
0.5000 mg | ORAL_TABLET | Freq: Three times a day (TID) | ORAL | Status: DC | PRN
  Administered 2024-07-29 – 2024-07-30 (×3): 0.5 mg via ORAL
  Filled 2024-07-28 (×3): qty 1

## 2024-07-28 MED ORDER — SODIUM CHLORIDE 0.9 % IV SOLN
2.0000 g | INTRAVENOUS | Status: AC
  Administered 2024-07-28: 2 g via INTRAVENOUS
  Filled 2024-07-28: qty 2

## 2024-07-28 NOTE — Op Note (Signed)
 07/28/2024  11:02 AM  PATIENT:  Carol Mack  54 y.o. female  Patient Care Team: Celestia Rosaline SQUIBB, NP as PCP - General (Internal Medicine) Lanny Callander, MD as Consulting Physician (Hematology and Oncology) Debby Hila, MD as Consulting Physician (General Surgery) Stacia Glendia BRAVO, MD as Consulting Physician (Gastroenterology) [name], Fannie SAILOR, NP as Nurse Practitioner (Hospice and Palliative Medicine)  PRE-OPERATIVE DIAGNOSIS:  COLON CANCER  POST-OPERATIVE DIAGNOSIS:  COLON CANCER  PROCEDURE:  Procedures: COLECTOMY, PARTIAL, ROBOT-ASSISTED, SMALL BOWEL RESECTION CLOSURE, HERNIA, EPIGASTRIC, ADULT  SURGEON:  Surgeon(s): Teresa Lonni HERO, MD Debby Hila, MD  ASSISTANT: General   ANESTHESIA:   local and general  EBL: 20ml Total I/O In: 1375 [I.V.:1025; IV Piggyback:350] Out: 50 [Urine:50]  Delay start of Pharmacological VTE agent (>24hrs) due to surgical blood loss or risk of bleeding:  no  DRAINS: none   Source of Specimen:  transverse colon anastomosis  DISPOSITION OF SPECIMEN:  PATHOLOGY  COUNTS:  YES  PLAN OF CARE: Admit to inpatient   PATIENT DISPOSITION:  PACU - hemodynamically stable.  INDICATION:    54 y.o. F with an anastomotic recurrence after splenic flexure resection ~1 yr ago.  I recommended segmental resection:  The anatomy & physiology of the digestive tract was discussed.  The pathophysiology was discussed.  Natural history risks without surgery was discussed.   I worked to give an overview of the disease and the frequent need to have multispecialty involvement.  I feel the risks of no intervention will lead to serious problems that outweigh the operative risks; therefore, I recommended a partial colectomy to remove the pathology.  Laparoscopic & open techniques were discussed.   Risks such as bleeding, infection, abscess, leak, reoperation, possible ostomy, hernia, heart attack, death, and other risks were discussed.  I noted a  good likelihood this will help address the problem.   Goals of post-operative recovery were discussed as well.    The patient expressed understanding & wished to proceed with surgery.  OR FINDINGS:   Patient had tumor growing along her anastomosis and invading a loop of adjacent small bowel.  No obvious metastatic disease on visceral parietal peritoneum or liver.   DESCRIPTION:   Informed consent was confirmed.  The patient underwent general anaesthesia without difficulty.  The patient was positioned appropriately.  VTE prevention in place.  The patient's abdomen was clipped, prepped, & draped in a sterile fashion.  Surgical timeout confirmed our plan.  The patient was positioned in reverse Trendelenburg.  Abdominal entry was gained using .  Entry was clean.  I induced carbon dioxide insufflation.  An 8mm robotic port was placed in the LLQ.  Camera inspection revealed no injury.  Extra ports were carefully placed under direct laparoscopic visualization.  I laparoscopically reflected the greater omentum and the upper abdomen the small bowel in the upper abdomen. The patient was appropriately positioned and the robot was docked to the patient's left side.  Instruments were placed under direct visualization.   I began by taking down the omental adhesions to the abdominal wall using the robotic vessel sealer.  There was a loop of small bowel that was adjacent to the posterior aspect of the colonic anastomosis.  This was bluntly divided from the colon surface.  It was apparent that there was a mass growing into this portion of the small bowel once I was able to dissect enough to see near the anastomosis.  This was left in place to resect en bloc.  I then turned my  attention to mobilizing the proximal and distal portions of the transverse colon using the robotic vessel sealer.  The hepatic flexure and splenic flexure were taken down using blunt dissection and robotic vessel sealer.  I then carefully  dissected under the anastomosis to free this from the retroperitoneum.  I opened up the gastrocolic ligament anteriorly and confirmed no further adhesions on the anterior side of the colon.  I then went down to the base of the colonic mesentery.  I divided this using the robotic vessel sealer.  There was an enlarged lymph node at this area which was taken with the specimen.  I continued up to the proximal border approximately 5 cm proximal to the anastomosis using the robotic vessel sealer.  I then divided the mesentery to the distal border also using the robotic vessel sealer.  The small bowel mesentery was divided as well to allow for easier resection.  We then injected firefly intravenously to check for perfusion of the remaining colon.  There was good perfusion to the proximal and distal limbs of the colon and small bowel.  At this point the robot was then docked and instruments were removed.  The ventral hernia was identified superior to the umbilicus.  An incision was made over this and dissection was carried down through the subcutaneous tissues to the level of the fascia.  The fascia was then divided on either side of the hernia and an Alexis wound protector was placed.  The colon was brought out of the wound.  The proximal and distal edges of the small bowel were divided using a 75 mm GIA stapler.  Hemostasis was achieved with interrupted 3-0 silk sutures.  The proximal and distal edges of the large bowel were divided also with separate staple loads of the GIA stapler.  The specimen was marked with a stitch distal and sent to pathology for further evaluation.  The small bowel was then placed in a antiperistaltic manner and a 75 mm GIA stapler was used to create an anastomosis.  A 60 mm TA stapler was used to close the common enterotomy channel.  The mesenteric defect was closed using a running 3-0 silk suture.  The corners of the anastomosis were imbricated using 3-0 silk sutures and antis tension suture  was placed.  This was then placed back into the abdomen.  Proximal and distal colon were aligned in a antiperistaltic manner as well.  An anastomosis was created using a 75 mm GIA stapler.  3-0 silk and tied tension sutures were placed at the base of the anastomosis.  The common enterotomy was closed using 2 running 2 oh V-Loc sutures.  This was then placed back into the abdomen.  There was no signs of tension on the anastomosis.  There was good perfusion noted.  The abdomen was irrigated with approximately 1 L of sterile water .  At this point we switched to clean gowns, gloves, instruments and drapes.  The fascia of the extraction site at midline was closed using 2 running #1 Novafil sutures.  The subcutaneous tissue was reapproximated using a running 2-0 Vicryl suture and the skin was closed using a running 4-0 Vicryl suture.  The remaining port sites were also closed using interrupted 4-0 Vicryl suture.  Dermabond was placed on the wounds.  The patient was then awakened from anesthesia and sent to the postanesthesia care unit in stable condition.  All counts were correct per operating room staff.   An MD assistant was necessary for tissue manipulation,  retraction and positioning due to the complexity of the case and hospital policies   Bernarda JAYSON Ned, MD  Colorectal and General Surgery [facility]

## 2024-07-28 NOTE — Anesthesia Procedure Notes (Signed)
 Procedure Name: Intubation Date/Time: 07/28/2024 8:34 AM  Performed by: Brandy Almarie BROCKS, CRNAPre-anesthesia Checklist: Patient identified, Emergency Drugs available, Suction available and Patient being monitored Patient Re-evaluated:Patient Re-evaluated prior to induction Oxygen Delivery Method: Circle system utilized Preoxygenation: Pre-oxygenation with 100% oxygen Induction Type: IV induction Ventilation: Mask ventilation without difficulty Laryngoscope Size: Mac and 4 Grade View: Grade II Tube type: Oral Tube size: 7.0 mm Number of attempts: 1 Airway Equipment and Method: Stylet Placement Confirmation: ETT inserted through vocal cords under direct vision, positive ETCO2 and breath sounds checked- equal and bilateral Secured at: 22 cm Tube secured with: Tape Dental Injury: Teeth and Oropharynx as per pre-operative assessment

## 2024-07-28 NOTE — Plan of Care (Signed)
  Problem: Activity: Goal: Ability to tolerate increased activity will improve Outcome: Progressing   Problem: Bowel/Gastric: Goal: Gastrointestinal status for postoperative course will improve Outcome: Progressing

## 2024-07-28 NOTE — Anesthesia Preprocedure Evaluation (Addendum)
"                                    Anesthesia Evaluation  Patient identified by MRN, date of birth, ID band Patient awake    Reviewed: Allergy & Precautions, NPO status , Patient's Chart, lab work & pertinent test results  Airway Mallampati: II  TM Distance: >3 FB Neck ROM: Full    Dental  (+) Edentulous Upper, Missing   Pulmonary asthma , former smoker ILD (interstitial lung disease)   Pulmonary exam normal        Cardiovascular hypertension, Pt. on medications Normal cardiovascular exam     Neuro/Psych  PSYCHIATRIC DISORDERS Anxiety      Neuromuscular disease    GI/Hepatic negative GI ROS, Neg liver ROS,,,  Endo/Other  negative endocrine ROS    Renal/GU negative Renal ROS     Musculoskeletal negative musculoskeletal ROS (+)    Abdominal   Peds  Hematology negative hematology ROS (+)   Anesthesia Other Findings COLON CANCER  Reproductive/Obstetrics                              Anesthesia Physical Anesthesia Plan  ASA: 3  Anesthesia Plan: General   Post-op Pain Management:    Induction: Intravenous  PONV Risk Score and Plan: 4 or greater and Ondansetron , Dexamethasone , Midazolam , Propofol  infusion and Treatment may vary due to age or medical condition  Airway Management Planned: Oral ETT  Additional Equipment:   Intra-op Plan:   Post-operative Plan: Extubation in OR  Informed Consent: I have reviewed the patients History and Physical, chart, labs and discussed the procedure including the risks, benefits and alternatives for the proposed anesthesia with the patient or authorized representative who has indicated his/her understanding and acceptance.     Dental advisory given  Plan Discussed with: CRNA  Anesthesia Plan Comments:          Anesthesia Quick Evaluation  "

## 2024-07-28 NOTE — H&P (Signed)
 PROVIDER:  BERNARDA WANDA NED, MD   MRN: [id number] DOB: [birth year]    Subjective        History of Present Illness: Carol Mack is a 54 y.o. female who is seen today as an office consultation at the request of Dr. Lanny for evaluation of No chief complaint on file. .  Patient with a history of T4N1 colon cancer.  She underwent a left hemicolectomy with side-to-side anastomosis on June 26, 2023.  She was noted to have a large mesenteric lymph node at the base of her middle colic vessel.  This was removed with surgery.  She did have a ileus after surgery that resolved by postop day 4.  She was discharged on postop day 6.  She then underwent adjuvant chemotherapy.   There is a 5 mm liver lesion that is too small to be characterized and currently stable on follow-up CT scan.  Follow-up colonoscopy was performed on 05/20/2024.  This showed a mass at the proximal and distal areas of the anastomosis.  Biopsies show recurrent adenocarcinoma.  PET scan was completed in October 2025 and showed 2 foci of metabolic activity in the mid transverse colon, a mildly enlarged periaortic lymph node with mild metabolic activity and enlarged prevascular and anterior mediastinal nodes with moderate metabolic activity.  MRI of the liver did not show any significant lesions.     Review of Systems: A complete review of systems was obtained from the patient.  I have reviewed this information and discussed as appropriate with the patient.  See HPI as well for other ROS.     Medical History:     Past Medical History:  Diagnosis Date   Asthma, unspecified asthma severity, unspecified whether complicated, unspecified whether persistent (HHS-HCC)     Hypertension     Thyroid  disease        There is no problem list on file for this patient.          Past Surgical History:  Procedure Laterality Date   COLON SURGERY       fibroids              Allergies  Allergen Reactions   Vicodin  [Hydrocodone -Acetaminophen ] Unknown            Current Outpatient Medications on File Prior to Visit  Medication Sig Dispense Refill   albuterol  MDI, PROVENTIL , VENTOLIN , PROAIR , HFA 90 mcg/actuation inhaler Inhale into the lungs       amLODIPine -olmesartan  (AZOR ) 10-40 mg tablet Take 1 tablet by mouth once daily (Patient not taking: Reported on 08/18/2023)       bisacodyL  (DULCOLAX) 5 mg EC tablet Take 4 tablets (20 mg total) by mouth once daily as needed for Constipation for up to 1 dose (Patient not taking: Reported on 08/18/2023) 4 tablet 0   oxyCODONE  (ROXICODONE ) 5 MG immediate release tablet Take 1 tablet (5 mg total) by mouth every 6 (six) hours as needed for Pain 20 tablet 0   polyethylene glycol (MIRALAX ) packet Take 17 g by mouth 2 (two) times daily        No current facility-administered medications on file prior to visit.           Family History  Problem Relation Age of Onset   High blood pressure (Hypertension) Mother     High blood pressure (Hypertension) Father        Social History        Tobacco Use  Smoking Status Former   Types: Cigarettes  Smokeless Tobacco Not on file      Social History         Socioeconomic History   Marital status: Single  Tobacco Use   Smoking status: Former      Types: Cigarettes  Vaping Use   Vaping status: Unknown  Substance and Sexual Activity   Alcohol use: Not Currently   Drug use: Not Currently    Social Drivers of Health        Food Insecurity: No Food Insecurity (06/26/2023)    Received from [facility]    Hunger Vital Sign     Within the past 12 months, you worried that your food would run out before you got the money to buy more.: Never true     Within the past 12 months, the food you bought just didn't last and you didn't have money to get more.: Never true  Transportation Needs: No Transportation Needs (06/26/2023)    Received from [facility] - Transportation     Lack of Transportation (Medical):  No     Lack of Transportation (Non-Medical): No  Housing Stability: Unknown (07/21/2023)    Housing Stability Vital Sign     Homeless in the Last Year: No      Objective:      Today's Vitals   07/28/24 0650 07/28/24 0704  BP:  (!) 128/93  Pulse:  72  Resp:  16  Temp:  98.8 F (37.1 C)  TempSrc:  Oral  SpO2:  95%  Weight: 57.6 kg   PainSc: 0-No pain    Body mass index is 21.8 kg/m.    Exam Gen: NAD CV: RRR Pulm: CTA Abd: soft, small reducible epigastric hernia     Labs, Imaging and Diagnostic Testing: CT images and report reviewed.  CT PET images and report reviewed.  Colonoscopy images and report reviewed.  Pathology reports reviewed.  MR abdomen report reviewed.   Assessment and Plan:  Local recurrence of malignant neoplasm of colon (CMS/HHS-HCC)  (primary encounter diagnosis) We discussed today performing a partial colectomy and resecting the anastomosis and several centimeters beyond on either side.  We discussed that we may be able to mobilize the splenic flexure and have enough of her colon available for an anastomosis.  If this is not the case, we will perform an extended right hemicolectomy and perform a small bowel to descending colon anastomosis.  We discussed the risk of loose stools and diarrhea if the latter option is chosen.  She does have an epigastric hernia that is bothering her.  We discussed performing extraction through this site and then closing it primarily.  We discussed the high chance of recurrence with this approach.  All questions were answered. The surgery and anatomy were described to the patient as well as the risks of surgery and the possible complications.  These include: Bleeding, deep abdominal infections and possible wound complications such as hernia and infection, damage to adjacent structures, leak of surgical connections, which can lead to other surgeries and possibly an ostomy, possible need for other procedures, such as abscess drains in  radiology, possible prolonged hospital stay, possible diarrhea from removal of part of the colon, possible constipation from narcotics, possible bowel, bladder or sexual dysfunction if having rectal surgery, prolonged fatigue/weakness or appetite loss, possible early recurrence of of disease, possible complications of their medical problems such as heart disease or arrhythmias or lung problems, death (less than 1%). I believe the patient understands and wishes to  proceed with the surgery.    Epigastric hernia Approximately 2 cm in diameter, reducible.  Will plan on extraction through this site and primary closure     Bernarda JAYSON Ned, MD Colon and Rectal Surgery [facility]

## 2024-07-28 NOTE — Transfer of Care (Signed)
 Immediate Anesthesia Transfer of Care Note  Patient: Carol Mack  Procedure(s) Performed: COLECTOMY, PARTIAL, ROBOT-ASSISTED, SMALL BOWEL RESECTION CLOSURE, HERNIA, EPIGASTRIC, ADULT  Patient Location: PACU  Anesthesia Type:General  Level of Consciousness: drowsy  Airway & Oxygen Therapy: Patient Spontanous Breathing and Patient connected to face mask oxygen  Post-op Assessment: Report given to RN, Post -op Vital signs reviewed and stable, and Patient moving all extremities X 4  Post vital signs: Reviewed and stable  Last Vitals:  Vitals Value Taken Time  BP 167/108 07/28/24 11:10  Temp    Pulse 81 07/28/24 11:12  Resp 16 07/28/24 11:12  SpO2 100 % 07/28/24 11:12  Vitals shown include unfiled device data.  Last Pain:  Vitals:   07/28/24 0704  TempSrc: Oral  PainSc:          Complications: No notable events documented.

## 2024-07-28 NOTE — Anesthesia Postprocedure Evaluation (Signed)
"   Anesthesia Post Note  Patient: Carol Mack  Procedure(s) Performed: COLECTOMY, PARTIAL, ROBOT-ASSISTED, SMALL BOWEL RESECTION CLOSURE, HERNIA, EPIGASTRIC, ADULT     Patient location during evaluation: PACU Anesthesia Type: General Level of consciousness: awake Pain management: pain level controlled Vital Signs Assessment: post-procedure vital signs reviewed and stable Respiratory status: spontaneous breathing, nonlabored ventilation and respiratory function stable Cardiovascular status: blood pressure returned to baseline and stable Postop Assessment: no apparent nausea or vomiting Anesthetic complications: no   No notable events documented.  Last Vitals:  Vitals:   07/28/24 1548 07/28/24 1717  BP: 106/70 115/71  Pulse: 70 76  Resp: 16 16  Temp: (!) 36.3 C 37.1 C  SpO2: 100% 97%    Last Pain:  Vitals:   07/28/24 1717  TempSrc: Oral  PainSc:                  Shahara [place] P Nixon [place]      "

## 2024-07-29 ENCOUNTER — Encounter (HOSPITAL_COMMUNITY): Payer: Self-pay | Admitting: General Surgery

## 2024-07-29 LAB — BASIC METABOLIC PANEL WITH GFR
Anion gap: 9 (ref 5–15)
BUN: 10 mg/dL (ref 6–20)
CO2: 27 mmol/L (ref 22–32)
Calcium: 9.5 mg/dL (ref 8.9–10.3)
Chloride: 102 mmol/L (ref 98–111)
Creatinine, Ser: 0.74 mg/dL (ref 0.44–1.00)
GFR, Estimated: >60 mL/min
Glucose, Bld: 86 mg/dL (ref 70–99)
Potassium: 4 mmol/L (ref 3.5–5.1)
Sodium: 137 mmol/L (ref 135–145)

## 2024-07-29 LAB — CBC
HCT: 33 % — ABNORMAL LOW (ref 36.0–46.0)
Hemoglobin: 11.7 g/dL — ABNORMAL LOW (ref 12.0–15.0)
MCH: 24.3 pg — ABNORMAL LOW (ref 26.0–34.0)
MCHC: 35.5 g/dL (ref 30.0–36.0)
MCV: 68.6 fL — ABNORMAL LOW (ref 80.0–100.0)
Platelets: 260 K/uL (ref 150–400)
RBC: 4.81 MIL/uL (ref 3.87–5.11)
RDW: 15 % (ref 11.5–15.5)
WBC: 10.8 K/uL — ABNORMAL HIGH (ref 4.0–10.5)
nRBC: 0 % (ref 0.0–0.2)

## 2024-07-29 NOTE — TOC Initial Note (Signed)
 Transition of Care [facility]) - Initial/Assessment Note    Patient Details  Name: Carol Mack MRN: [id number] Date of Birth: 04/20/1971  [facility]) CM/SW Contact:    Alfonse JONELLE Rex, RN Phone Number: 07/29/2024, 10:33 AM  Clinical Narrative:      Admitted for scheduled procedure on 1/14, underwent partial colectomy, small bowel resection.   Resides in an apartment, PCP and insurance on file, family contacts on file. CM will follow for dc needs   Expected Discharge Plan: Home w Home Health Services Barriers to Discharge: Continued Medical Work up   Patient Goals and CMS Choice Patient states their goals for this hospitalization and ongoing recovery are:: return home          Expected Discharge Plan and Services       Living arrangements for the past 2 months: Apartment                                      Prior Living Arrangements/Services Living arrangements for the past 2 months: Apartment Lives with:: Relatives Patient language and need for interpreter reviewed:: Yes        Need for Family Participation in Patient Care: Yes (Comment) Care giver support system in place?: Yes (comment)   Criminal Activity/Legal Involvement Pertinent to Current Situation/Hospitalization: No - Comment as needed  Activities of Daily Living   ADL Screening (condition at time of admission) Independently performs ADLs?: Yes (appropriate for developmental age) Is the patient deaf or have difficulty hearing?: No Does the patient have difficulty seeing, even when wearing glasses/contacts?: No Does the patient have difficulty concentrating, remembering, or making decisions?: No  Permission Sought/Granted                  Emotional Assessment       Orientation: : Oriented to Self, Oriented to Place, Oriented to  Time, Oriented to Situation Alcohol / Substance Use: Not Applicable Psych Involvement: No (comment)  Admission diagnosis:  Colon cancer [facility])  [C18.9] Patient Active Problem List   Diagnosis Date Noted   Colon cancer (HCC) 07/28/2024   Port-A-Cath in place 08/06/2023   Colon cancer metastasized to mesenteric lymph nodes (HCC) 07/25/2023   Colon polyp 06/26/2023   S/P laparoscopic hysterectomy 09/15/2018   Left breast lump 08/07/2018   Essential hypertension 05/28/2018   Cigarette nicotine  dependence without complication 05/28/2018   Multinodular goiter 01/19/2016   Hyperthyroidism 09/25/2015   PCP:  Celestia Rosaline SQUIBB, NP Pharmacy:   [facility] [address] [place] [address] Phone: [phone] Fax: [phone]     Social Drivers of Health (SDOH) Social History: SDOH Screenings   Food Insecurity: Unknown (07/28/2024)  Housing: Low Risk (07/28/2024)  Transportation Needs: No Transportation Needs (07/28/2024)  Utilities: Not At Risk (07/28/2024)  Depression (PHQ2-9): Low Risk (02/10/2024)  Financial Resource Strain: Low Risk (07/19/2024)  Physical Activity: Sufficiently Active (07/19/2024)  Social Connections: Moderately Isolated (07/19/2024)  Stress: No Stress Concern Present (07/19/2024)  Tobacco Use: Medium Risk (07/28/2024)   SDOH Interventions:     Readmission Risk Interventions    07/29/2024   10:32 AM  Readmission Risk Prevention Plan  Post Dischage Appt Complete  Medication Screening Complete  Transportation Screening Complete

## 2024-07-29 NOTE — Progress Notes (Signed)
 1 Day Post-Op  Subjective: Having some nausea but feeling hungry.  Passing flatus.  Pain controlled with PO and IV med combination  Objective: Vital signs in last 24 hours: Temp:  [97.4 F (36.3 C)-99.1 F (37.3 C)] 97.7 F (36.5 C) (01/15 1034) Pulse Rate:  [64-85] 68 (01/15 1034) Resp:  [12-19] 16 (01/15 1034) BP: (95-138)/(61-88) 103/69 (01/15 1034) SpO2:  [79 %-100 %] 79 % (01/15 1034) Weight:  [62.5 kg-63.9 kg] 62.5 kg (01/15 0500)   Intake/Output from previous day: 01/14 0701 - 01/15 0700 In: 2727.9 [P.O.:540; I.V.:1837.9; IV Piggyback:350] Out: 1775 [Urine:1350; Emesis/NG output:400; Blood:25] Intake/Output this shift: Total I/O In: 120 [P.O.:120] Out: -    General appearance: alert and cooperative GI: soft, non-distended  Incision: no significant drainage  Lab Results:  Recent Labs    07/29/24 0509  WBC 10.8*  HGB 11.7*  HCT 33.0*  PLT 260   BMET Recent Labs    07/29/24 0509  NA 137  K 4.0  CL 102  CO2 27  GLUCOSE 86  BUN 10  CREATININE 0.74  CALCIUM  9.5   PT/INR No results for input(s): LABPROT, INR in the last 72 hours. ABG No results for input(s): PHART, HCO3 in the last 72 hours.  Invalid input(s): PCO2, PO2  MEDS, Scheduled  alvimopan   12 mg Oral BID   amLODipine   10 mg Oral Daily   bisacodyl   20 mg Oral Daily   enoxaparin  (LOVENOX ) injection  40 mg Subcutaneous Q24H   feeding supplement  237 mL Oral BID BM   ferrous sulfate   325 mg Oral Q breakfast   saccharomyces boulardii  250 mg Oral BID    Studies/Results: No results found.  Assessment: s/p Procedures: COLECTOMY, PARTIAL, ROBOT-ASSISTED, SMALL BOWEL RESECTION CLOSURE, HERNIA, EPIGASTRIC, ADULT Patient Active Problem List   Diagnosis Date Noted   Colon cancer (HCC) 07/28/2024   Port-A-Cath in place 08/06/2023   Colon cancer metastasized to mesenteric lymph nodes (HCC) 07/25/2023   Colon polyp 06/26/2023   S/P laparoscopic hysterectomy 09/15/2018   Left  breast lump 08/07/2018   Essential hypertension 05/28/2018   Cigarette nicotine  dependence without complication 05/28/2018   Multinodular goiter 01/19/2016   Hyperthyroidism 09/25/2015    Expected post operative course  Plan: d/c foley Advance diet SL IVFs Ambulate in hall   LOS: 1 day     .Bernarda JAYSON Ned, MD [facility], [place]    07/29/2024 11:29 AM

## 2024-07-30 LAB — CBC
HCT: 34.4 % — ABNORMAL LOW (ref 36.0–46.0)
Hemoglobin: 11.9 g/dL — ABNORMAL LOW (ref 12.0–15.0)
MCH: 23.8 pg — ABNORMAL LOW (ref 26.0–34.0)
MCHC: 34.6 g/dL (ref 30.0–36.0)
MCV: 68.8 fL — ABNORMAL LOW (ref 80.0–100.0)
Platelets: 244 K/uL (ref 150–400)
RBC: 5 MIL/uL (ref 3.87–5.11)
RDW: 15.1 % (ref 11.5–15.5)
WBC: 9 K/uL (ref 4.0–10.5)
nRBC: 0 % (ref 0.0–0.2)

## 2024-07-30 LAB — BASIC METABOLIC PANEL WITH GFR
Anion gap: 10 (ref 5–15)
BUN: 10 mg/dL (ref 6–20)
CO2: 26 mmol/L (ref 22–32)
Calcium: 9.4 mg/dL (ref 8.9–10.3)
Chloride: 103 mmol/L (ref 98–111)
Creatinine, Ser: 0.72 mg/dL (ref 0.44–1.00)
GFR, Estimated: >60 mL/min
Glucose, Bld: 103 mg/dL — ABNORMAL HIGH (ref 70–99)
Potassium: 4.3 mmol/L (ref 3.5–5.1)
Sodium: 139 mmol/L (ref 135–145)

## 2024-07-30 MED ORDER — METHOCARBAMOL 500 MG PO TABS
1000.0000 mg | ORAL_TABLET | Freq: Four times a day (QID) | ORAL | Status: DC | PRN
  Administered 2024-07-31: 1000 mg via ORAL
  Filled 2024-07-30: qty 2

## 2024-07-30 NOTE — Progress Notes (Signed)
 2 Days Post-Op  Subjective: Having some occasional nausea but feels that it is not related to food intake.  Passing flatus, no BM yet.  Pain controlled with PO and IV med combination  Objective: Vital signs in last 24 hours: Temp:  [97.7 F (36.5 C)-99.9 F (37.7 C)] 99.9 F (37.7 C) (01/16 0456) Pulse Rate:  [68-81] 74 (01/16 0456) Resp:  [16-17] 17 (01/16 0456) BP: (101-127)/(68-96) 127/96 (01/16 0456) SpO2:  [85 %-100 %] 91 % (01/16 0456) Weight:  [62.2 kg] 62.2 kg (01/16 0449)   Intake/Output from previous day: 01/15 0701 - 01/16 0700 In: 1148.9 [P.O.:900; I.V.:248.9] Out: 1300 [Urine:300; Emesis/NG output:1000] Intake/Output this shift: No intake/output data recorded.   General appearance: alert and cooperative GI: soft, non-distended  Incision: no significant drainage  Lab Results:  Recent Labs    07/29/24 0509 07/30/24 0508  WBC 10.8* 9.0  HGB 11.7* 11.9*  HCT 33.0* 34.4*  PLT 260 244   BMET Recent Labs    07/29/24 0509 07/30/24 0508  NA 137 139  K 4.0 4.3  CL 102 103  CO2 27 26  GLUCOSE 86 103*  BUN 10 10  CREATININE 0.74 0.72  CALCIUM  9.5 9.4   PT/INR No results for input(s): LABPROT, INR in the last 72 hours. ABG No results for input(s): PHART, HCO3 in the last 72 hours.  Invalid input(s): PCO2, PO2  MEDS, Scheduled  alvimopan   12 mg Oral BID   amLODipine   10 mg Oral Daily   bisacodyl   20 mg Oral Daily   enoxaparin  (LOVENOX ) injection  40 mg Subcutaneous Q24H   feeding supplement  237 mL Oral BID BM   ferrous sulfate   325 mg Oral Q breakfast   saccharomyces boulardii  250 mg Oral BID    Studies/Results: No results found.  Assessment: s/p Procedures: COLECTOMY, PARTIAL, ROBOT-ASSISTED, SMALL BOWEL RESECTION CLOSURE, HERNIA, EPIGASTRIC, ADULT Patient Active Problem List   Diagnosis Date Noted   Colon cancer (HCC) 07/28/2024   Port-A-Cath in place 08/06/2023   Colon cancer metastasized to mesenteric lymph nodes (HCC)  07/25/2023   Colon polyp 06/26/2023   S/P laparoscopic hysterectomy 09/15/2018   Left breast lump 08/07/2018   Essential hypertension 05/28/2018   Cigarette nicotine  dependence without complication 05/28/2018   Multinodular goiter 01/19/2016   Hyperthyroidism 09/25/2015    Expected post operative course  Plan: Incentive spirometry q1h while awake, wean to room air Ambulate in hall TID Await return of bowel function given her history after previous surgery   LOS: 2 days     .Bernarda JAYSON Ned, MD [facility], [place]    07/30/2024 10:07 AM

## 2024-07-30 NOTE — Plan of Care (Signed)
" °  Problem: Education: Goal: Understanding of discharge needs will improve Outcome: Progressing   Problem: Activity: Goal: Ability to tolerate increased activity will improve Outcome: Progressing   Problem: Bowel/Gastric: Goal: Gastrointestinal status for postoperative course will improve Outcome: Progressing   Problem: Nutritional: Goal: Will attain and maintain optimal nutritional status will improve Outcome: Progressing   Problem: Clinical Measurements: Goal: Postoperative complications will be avoided or minimized Outcome: Progressing   Problem: Respiratory: Goal: Respiratory status will improve Outcome: Progressing   Problem: Skin Integrity: Goal: Will show signs of wound healing Outcome: Progressing   Problem: Education: Goal: Knowledge of the prescribed therapeutic regimen will improve Outcome: Progressing   Problem: Bowel/Gastric: Goal: Gastrointestinal status for postoperative course will improve Outcome: Progressing   Problem: Cardiac: Goal: Ability to maintain an adequate cardiac output Outcome: Progressing Goal: Will show no evidence of cardiac arrhythmias Outcome: Progressing   Problem: Nutritional: Goal: Will attain and maintain optimal nutritional status Outcome: Progressing   Problem: Neurological: Goal: Will regain or maintain usual level of consciousness Outcome: Progressing   Problem: Clinical Measurements: Goal: Ability to maintain clinical measurements within normal limits Outcome: Progressing Goal: Postoperative complications will be avoided or minimized Outcome: Progressing   Problem: Respiratory: Goal: Will regain and/or maintain adequate ventilation Outcome: Progressing Goal: Respiratory status will improve Outcome: Progressing   Problem: Skin Integrity: Goal: Demonstrates signs of wound healing without infection Outcome: Progressing   Problem: Urinary Elimination: Goal: Will remain free from infection Outcome:  Progressing Goal: Ability to achieve and maintain adequate urine output Outcome: Progressing   Problem: Education: Goal: Knowledge of General Education information will improve Description: Including pain rating scale, medication(s)/side effects and non-pharmacologic comfort measures Outcome: Progressing   Problem: Clinical Measurements: Goal: Ability to maintain clinical measurements within normal limits will improve Outcome: Progressing Goal: Will remain free from infection Outcome: Progressing Goal: Diagnostic test results will improve Outcome: Progressing Goal: Respiratory complications will improve Outcome: Progressing   Problem: Activity: Goal: Risk for activity intolerance will decrease Outcome: Progressing   Problem: Nutrition: Goal: Adequate nutrition will be maintained Outcome: Progressing   Problem: Coping: Goal: Level of anxiety will decrease Outcome: Progressing   Problem: Elimination: Goal: Will not experience complications related to bowel motility Outcome: Progressing Goal: Will not experience complications related to urinary retention Outcome: Progressing   Problem: Pain Managment: Goal: General experience of comfort will improve and/or be controlled Outcome: Progressing   Problem: Safety: Goal: Ability to remain free from injury will improve Outcome: Progressing   Problem: Skin Integrity: Goal: Risk for impaired skin integrity will decrease Outcome: Progressing   "

## 2024-07-30 NOTE — Plan of Care (Signed)
" °  Problem: Education: Goal: Understanding of discharge needs will improve Outcome: Progressing   Problem: Activity: Goal: Ability to tolerate increased activity will improve Outcome: Progressing   Problem: Bowel/Gastric: Goal: Gastrointestinal status for postoperative course will improve Outcome: Progressing   Problem: Health Behavior/Discharge Planning: Goal: Identification of community resources to assist with postoperative recovery needs will improve Outcome: Progressing   Problem: Nutritional: Goal: Will attain and maintain optimal nutritional status will improve Outcome: Progressing   Problem: Clinical Measurements: Goal: Postoperative complications will be avoided or minimized Outcome: Progressing   Problem: Respiratory: Goal: Respiratory status will improve Outcome: Progressing   Problem: Skin Integrity: Goal: Will show signs of wound healing Outcome: Progressing   Problem: Education: Goal: Knowledge of the prescribed therapeutic regimen will improve Outcome: Progressing   Problem: Bowel/Gastric: Goal: Gastrointestinal status for postoperative course will improve Outcome: Progressing   Problem: Cardiac: Goal: Ability to maintain an adequate cardiac output Outcome: Progressing   Problem: Nutritional: Goal: Will attain and maintain optimal nutritional status Outcome: Progressing   Problem: Neurological: Goal: Will regain or maintain usual level of consciousness Outcome: Progressing   Problem: Clinical Measurements: Goal: Ability to maintain clinical measurements within normal limits Outcome: Progressing Goal: Postoperative complications will be avoided or minimized Outcome: Progressing   Problem: Respiratory: Goal: Will regain and/or maintain adequate ventilation Outcome: Progressing Goal: Respiratory status will improve Outcome: Progressing   Problem: Skin Integrity: Goal: Demonstrates signs of wound healing without infection Outcome:  Progressing   Problem: Urinary Elimination: Goal: Will remain free from infection Outcome: Progressing   Problem: Education: Goal: Knowledge of General Education information will improve Description: Including pain rating scale, medication(s)/side effects and non-pharmacologic comfort measures Outcome: Progressing   Problem: Clinical Measurements: Goal: Ability to maintain clinical measurements within normal limits will improve Outcome: Progressing Goal: Will remain free from infection Outcome: Progressing Goal: Diagnostic test results will improve Outcome: Progressing Goal: Respiratory complications will improve Outcome: Progressing Goal: Cardiovascular complication will be avoided Outcome: Progressing   Problem: Activity: Goal: Risk for activity intolerance will decrease Outcome: Progressing   Problem: Nutrition: Goal: Adequate nutrition will be maintained Outcome: Progressing   Problem: Elimination: Goal: Will not experience complications related to bowel motility Outcome: Progressing Goal: Will not experience complications related to urinary retention Outcome: Progressing   Problem: Pain Managment: Goal: General experience of comfort will improve and/or be controlled Outcome: Progressing   Problem: Safety: Goal: Ability to remain free from injury will improve Outcome: Progressing   Problem: Skin Integrity: Goal: Risk for impaired skin integrity will decrease Outcome: Progressing   "

## 2024-07-30 NOTE — Progress Notes (Signed)
 Pt has c/o pain in her ABD surgical scar looks good it has skin glue on no odor or drainage. See MAR for pain medication given. Pt is very secluded and is not very forthcoming with answers you will have to ask her a few times to get an answer from her. No swelling in legs or hands LBM was Wednesday after she took prep for surgery Dulcolax was given this morning will continue to monitor for pain

## 2024-07-30 NOTE — Progress Notes (Signed)
 Pt is stating the doctor stated if she had a BM she can leave me nor tech witnessed her BM she walked one time in the hallway 360 ft with w/o O2 which dropped to 87dr is in surgery all day waiting on PA to respond back

## 2024-07-31 ENCOUNTER — Other Ambulatory Visit (HOSPITAL_COMMUNITY): Payer: Self-pay

## 2024-07-31 LAB — BASIC METABOLIC PANEL WITH GFR
Anion gap: 10 (ref 5–15)
BUN: 9 mg/dL (ref 6–20)
CO2: 28 mmol/L (ref 22–32)
Calcium: 9.9 mg/dL (ref 8.9–10.3)
Chloride: 99 mmol/L (ref 98–111)
Creatinine, Ser: 0.69 mg/dL (ref 0.44–1.00)
GFR, Estimated: >60 mL/min
Glucose, Bld: 93 mg/dL (ref 70–99)
Potassium: 4.1 mmol/L (ref 3.5–5.1)
Sodium: 136 mmol/L (ref 135–145)

## 2024-07-31 LAB — CBC
HCT: 35.6 % — ABNORMAL LOW (ref 36.0–46.0)
Hemoglobin: 12.4 g/dL (ref 12.0–15.0)
MCH: 24 pg — ABNORMAL LOW (ref 26.0–34.0)
MCHC: 34.8 g/dL (ref 30.0–36.0)
MCV: 69 fL — ABNORMAL LOW (ref 80.0–100.0)
Platelets: 266 K/uL (ref 150–400)
RBC: 5.16 MIL/uL — ABNORMAL HIGH (ref 3.87–5.11)
RDW: 15 % (ref 11.5–15.5)
WBC: 8.3 K/uL (ref 4.0–10.5)
nRBC: 0 % (ref 0.0–0.2)

## 2024-07-31 MED ORDER — OXYCODONE HCL 5 MG PO TABS
5.0000 mg | ORAL_TABLET | Freq: Four times a day (QID) | ORAL | 0 refills | Status: DC | PRN
  Filled 2024-07-31: qty 30, 4d supply, fill #0

## 2024-07-31 NOTE — Progress Notes (Signed)
 Discharge meds in a secure bag delivered to patient by this RN

## 2024-07-31 NOTE — Discharge Summary (Signed)
 Physician Discharge Summary  Patient ID: Carol Mack MRN: [id number] DOB/AGE: 1971-05-05 54 y.o.  Admit date: 07/28/2024 Discharge date: 07/31/2024  Admission Diagnoses:  Discharge Diagnoses:  Principal Problem:   Colon cancer [facility])   Discharged Condition: good  Hospital Course: Patient was admitted to the med surg floor after surgery.  Diet was advanced as tolerated.  Patient began to have bowel function on postop day 2.  By postop day 3, she was tolerating a solid diet and pain was controlled with oral medications.  She was urinating without difficulty and ambulating without assistance.  Patient was felt to be in stable condition for discharge to home.   Consults: None  Significant Diagnostic Studies: labs: cbc, bmet  Treatments: IV hydration and surgery: robotic assisted partial colectomy and small bowel resection  Discharge Exam: Blood pressure 117/76, pulse 78, temperature 97.8 F (36.6 C), temperature source Oral, resp. rate 16, height 5' 4 (1.626 m), weight 60.2 kg, SpO2 99%. General appearance: alert and cooperative GI: soft, non-distended Incision/Wound: clean, dry, intact  Disposition: Discharge disposition: 01-Home or Self Care        Allergies as of 07/31/2024       Reactions   Hydrocodone -acetaminophen  Other (See Comments)   Hydrocodone  Other (See Comments)   Made the patient jittery        Medication List     TAKE these medications    albuterol  108 (90 Base) MCG/ACT inhaler Commonly known as: VENTOLIN  HFA Inhale 1-2 puffs into the lungs every 6 (six) hours as needed for shortness of breath.   amLODipine  10 MG tablet Commonly known as: NORVASC  Take 1 tablet (10 mg total) by mouth daily.   bisacodyl  5 MG EC tablet Generic drug: bisacodyl  Take 4 tablets (20 mg total) by mouth once daily as needed for Constipation for up to 1 dose   dronabinol  5 MG capsule Commonly known as: MARINOL  Take 1 capsule (5 mg total) by mouth 2 (two) times  daily before lunch and supper.   ferrous sulfate  325 (65 FE) MG tablet Take 1 tablet (325 mg total) by mouth daily with breakfast.   ibuprofen  800 MG tablet Commonly known as: ADVIL  Take 1 tablet (800 mg total) by mouth every 6 (six) hours as needed for mild pain   lidocaine -prilocaine  cream Commonly known as: EMLA  Apply to affected area once What changed:  how much to take how to take this when to take this reasons to take this additional instructions   LORazepam  0.5 MG tablet Commonly known as: ATIVAN  Take 1 tablet (0.5 mg total) by mouth every 8 (eight) hours as needed for anxiety.   metroNIDAZOLE  500 MG tablet Commonly known as: FLAGYL  Take 2 tablets (1,000 mg total) by mouth 3 (three) times daily for 3 doses Take according to your procedure colon prep instructions   neomycin  500 MG tablet Commonly known as: MYCIFRADIN  Take 2 tablets (1,000 mg total) by mouth 3 (three) times daily for 3 doses Take according to your procedure colon prep instructions   ondansetron  4 MG tablet Commonly known as: ZOFRAN  Take 1 tablet (4 mg total) by mouth 2 (two) times daily Take 1 tablet at 3pm and then 1 tablet at 10pm   oxyCODONE  5 MG immediate release tablet Commonly known as: Oxy IR/ROXICODONE  Take 1-2 tablets (5-10 mg total) by mouth every 6 (six) hours as needed for severe pain (pain score 7-10). What changed:  how much to take when to take this   polyethylene glycol powder 17 GM/SCOOP powder  Commonly known as: GLYCOLAX /MIRALAX  Take by mouth once for 1 dose Take according to your procedure prep instructions.        Follow-up Information     Debby Hila, MD. Schedule an appointment as soon as possible for a visit in 2 week(s).   Specialties: General Surgery, Colon and Rectal Surgery Contact information: [address] [place] [address] [place] [address] [phone]                 Signed: Hila JAYSON Debby 07/31/2024, 8:28 AM

## 2024-07-31 NOTE — Discharge Instructions (Signed)

## 2024-07-31 NOTE — Progress Notes (Signed)
 Discharge instructions given to patient and family, all questions were answered. Pt meds delivered to room.

## 2024-07-31 NOTE — Plan of Care (Signed)
  Problem: Education: Goal: Understanding of discharge needs will improve Outcome: Adequate for Discharge Goal: Verbalization of understanding of the causes of altered bowel function will improve Outcome: Adequate for Discharge   Problem: Activity: Goal: Ability to tolerate increased activity will improve Outcome: Adequate for Discharge   Problem: Bowel/Gastric: Goal: Gastrointestinal status for postoperative course will improve Outcome: Adequate for Discharge   Problem: Health Behavior/Discharge Planning: Goal: Identification of community resources to assist with postoperative recovery needs will improve Outcome: Adequate for Discharge

## 2024-08-02 ENCOUNTER — Telehealth: Payer: Self-pay

## 2024-08-02 NOTE — Transitions of Care (Post Inpatient/ED Visit) (Signed)
" ° °  08/02/2024  Name: Shannah Conteh MRN: [id number] DOB: 06-Sep-1970  Today's TOC FU Call Status: Today's TOC FU Call Status:: Unsuccessful Call (1st Attempt) Unsuccessful Call (1st Attempt) Date: 08/02/24  Attempted to reach the patient regarding the most recent Inpatient/ED visit.  Follow Up Plan: Additional outreach attempts will be made to reach the patient to complete the Transitions of Care (Post Inpatient/ED visit) call.   Signature  Slater Diesel, RN   "

## 2024-08-03 ENCOUNTER — Telehealth: Payer: Self-pay

## 2024-08-03 NOTE — Transitions of Care (Post Inpatient/ED Visit) (Signed)
" ° °  08/03/2024  Name: Carol Mack MRN: [id number] DOB: 1970/07/22  Today's TOC FU Call Status: Today's TOC FU Call Status:: Unsuccessful Call (2nd Attempt) Unsuccessful Call (1st Attempt) Date: 08/02/24 Unsuccessful Call (2nd Attempt) Date: 08/03/24  Attempted to reach the patient regarding the most recent Inpatient/ED visit.  Follow Up Plan: Additional outreach attempts will be made to reach the patient to complete the Transitions of Care (Post Inpatient/ED visit) call.   Signature  Slater Diesel, RN   "

## 2024-08-04 ENCOUNTER — Telehealth: Payer: Self-pay

## 2024-08-04 NOTE — Transitions of Care (Post Inpatient/ED Visit) (Signed)
" ° °  08/04/2024  Name: Carol Mack MRN: [id number] DOB: Aug 26, 1970  Today's TOC FU Call Status: Today's TOC FU Call Status:: Unsuccessful Call (3rd Attempt) Unsuccessful Call (1st Attempt) Date: 08/02/24 Unsuccessful Call (2nd Attempt) Date: 08/03/24 Unsuccessful Call (3rd Attempt) Date: 08/04/24  Attempted to reach the patient regarding the most recent Inpatient/ED visit.  Follow Up Plan: No further outreach attempts will be made at this time. We have been unable to contact the patient.  Signature  Slater Diesel, RN   "

## 2024-08-07 ENCOUNTER — Other Ambulatory Visit (HOSPITAL_COMMUNITY): Payer: Self-pay

## 2024-08-07 ENCOUNTER — Other Ambulatory Visit (HOSPITAL_COMMUNITY): Payer: Self-pay | Admitting: General Surgery

## 2024-08-07 DIAGNOSIS — G893 Neoplasm related pain (acute) (chronic): Secondary | ICD-10-CM

## 2024-08-07 DIAGNOSIS — C189 Malignant neoplasm of colon, unspecified: Secondary | ICD-10-CM

## 2024-08-07 DIAGNOSIS — Z515 Encounter for palliative care: Secondary | ICD-10-CM

## 2024-08-09 ENCOUNTER — Other Ambulatory Visit (HOSPITAL_COMMUNITY): Payer: Self-pay

## 2024-08-10 ENCOUNTER — Inpatient Hospital Stay

## 2024-08-11 ENCOUNTER — Other Ambulatory Visit: Payer: Self-pay

## 2024-08-11 NOTE — Progress Notes (Signed)
 The proposed treatment discussed in conference is for discussion purpose only and is not a binding recommendation.  The patients have not been physically examined, or presented with their treatment options.  Therefore, final treatment plans cannot be decided.

## 2024-08-12 ENCOUNTER — Other Ambulatory Visit: Payer: Self-pay

## 2024-08-12 ENCOUNTER — Other Ambulatory Visit (HOSPITAL_COMMUNITY): Payer: Self-pay

## 2024-08-12 LAB — SURGICAL PATHOLOGY

## 2024-08-13 ENCOUNTER — Other Ambulatory Visit (HOSPITAL_COMMUNITY): Payer: Self-pay

## 2024-08-13 MED ORDER — OXYCODONE HCL 5 MG PO TABS
5.0000 mg | ORAL_TABLET | Freq: Four times a day (QID) | ORAL | 0 refills | Status: DC | PRN
  Filled 2024-08-13: qty 20, 5d supply, fill #0

## 2024-08-17 ENCOUNTER — Other Ambulatory Visit: Payer: Self-pay

## 2024-08-17 DIAGNOSIS — C189 Malignant neoplasm of colon, unspecified: Secondary | ICD-10-CM

## 2024-08-18 ENCOUNTER — Inpatient Hospital Stay: Admitting: Hematology

## 2024-08-18 ENCOUNTER — Other Ambulatory Visit (HOSPITAL_COMMUNITY): Payer: Self-pay

## 2024-08-18 ENCOUNTER — Other Ambulatory Visit (HOSPITAL_COMMUNITY): Payer: Self-pay | Admitting: General Surgery

## 2024-08-18 ENCOUNTER — Telehealth: Payer: Self-pay | Admitting: Pharmacist

## 2024-08-18 ENCOUNTER — Telehealth: Payer: Self-pay

## 2024-08-18 ENCOUNTER — Encounter: Payer: Self-pay | Admitting: Nurse Practitioner

## 2024-08-18 ENCOUNTER — Inpatient Hospital Stay: Attending: Hematology

## 2024-08-18 ENCOUNTER — Inpatient Hospital Stay: Admitting: Nurse Practitioner

## 2024-08-18 VITALS — BP 118/76 | HR 78 | Temp 97.7°F | Resp 17 | Ht 64.0 in | Wt 120.4 lb

## 2024-08-18 DIAGNOSIS — C189 Malignant neoplasm of colon, unspecified: Secondary | ICD-10-CM

## 2024-08-18 DIAGNOSIS — F419 Anxiety disorder, unspecified: Secondary | ICD-10-CM

## 2024-08-18 DIAGNOSIS — G4709 Other insomnia: Secondary | ICD-10-CM

## 2024-08-18 DIAGNOSIS — Z515 Encounter for palliative care: Secondary | ICD-10-CM

## 2024-08-18 DIAGNOSIS — G893 Neoplasm related pain (acute) (chronic): Secondary | ICD-10-CM

## 2024-08-18 DIAGNOSIS — C772 Secondary and unspecified malignant neoplasm of intra-abdominal lymph nodes: Secondary | ICD-10-CM | POA: Diagnosis not present

## 2024-08-18 LAB — CBC WITH DIFFERENTIAL (CANCER CENTER ONLY)
Abs Immature Granulocytes: 0.01 10*3/uL (ref 0.00–0.07)
Basophils Absolute: 0.1 10*3/uL (ref 0.0–0.1)
Basophils Relative: 1 %
Eosinophils Absolute: 0.2 10*3/uL (ref 0.0–0.5)
Eosinophils Relative: 4 %
HCT: 34.5 % — ABNORMAL LOW (ref 36.0–46.0)
Hemoglobin: 12.2 g/dL (ref 12.0–15.0)
Immature Granulocytes: 0 %
Lymphocytes Relative: 35 %
Lymphs Abs: 2 10*3/uL (ref 0.7–4.0)
MCH: 24 pg — ABNORMAL LOW (ref 26.0–34.0)
MCHC: 35.4 g/dL (ref 30.0–36.0)
MCV: 67.9 fL — ABNORMAL LOW (ref 80.0–100.0)
Monocytes Absolute: 0.7 10*3/uL (ref 0.1–1.0)
Monocytes Relative: 11 %
Neutro Abs: 2.9 10*3/uL (ref 1.7–7.7)
Neutrophils Relative %: 49 %
Platelet Count: 310 10*3/uL (ref 150–400)
RBC: 5.08 MIL/uL (ref 3.87–5.11)
RDW: 15.4 % (ref 11.5–15.5)
WBC Count: 5.8 10*3/uL (ref 4.0–10.5)
nRBC: 0 % (ref 0.0–0.2)

## 2024-08-18 LAB — CMP (CANCER CENTER ONLY)
ALT: 7 U/L (ref 0–44)
AST: 16 U/L (ref 15–41)
Albumin: 4 g/dL (ref 3.5–5.0)
Alkaline Phosphatase: 80 U/L (ref 38–126)
Anion gap: 10 (ref 5–15)
BUN: 13 mg/dL (ref 6–20)
CO2: 27 mmol/L (ref 22–32)
Calcium: 9.8 mg/dL (ref 8.9–10.3)
Chloride: 106 mmol/L (ref 98–111)
Creatinine: 0.9 mg/dL (ref 0.44–1.00)
GFR, Estimated: >60 mL/min
Glucose, Bld: 121 mg/dL — ABNORMAL HIGH (ref 70–99)
Potassium: 4.3 mmol/L (ref 3.5–5.1)
Sodium: 143 mmol/L (ref 135–145)
Total Bilirubin: 0.3 mg/dL (ref 0.0–1.2)
Total Protein: 7.6 g/dL (ref 6.5–8.1)

## 2024-08-18 LAB — GENETIC SCREENING ORDER

## 2024-08-18 LAB — CEA (ACCESS): CEA (CHCC): 31.21 ng/mL — ABNORMAL HIGH (ref 0.00–5.00)

## 2024-08-18 MED ORDER — OXYCODONE HCL 5 MG PO TABS
5.0000 mg | ORAL_TABLET | Freq: Four times a day (QID) | ORAL | 0 refills | Status: AC | PRN
  Filled 2024-08-18: qty 60, 8d supply, fill #0

## 2024-08-18 MED ORDER — CAPECITABINE 500 MG PO TABS: 1000.0000 mg/m2 | ORAL_TABLET | Freq: Two times a day (BID) | ORAL | 0 refills | Status: DC

## 2024-08-18 MED ORDER — CAPECITABINE 500 MG PO TABS: 1000.0000 mg/m2 | ORAL_TABLET | Freq: Two times a day (BID) | ORAL | 0 refills | Status: AC

## 2024-08-18 MED ORDER — LORAZEPAM 0.5 MG PO TABS
0.5000 mg | ORAL_TABLET | Freq: Three times a day (TID) | ORAL | 0 refills | Status: AC | PRN
  Filled 2024-08-18: qty 30, 10d supply, fill #0

## 2024-08-18 NOTE — Telephone Encounter (Signed)
 Oral Oncology Pharmacist Encounter  Received new prescription for Xeloda  (capecitabine ) for the adjuvant treatment of metastatic colon cancer, planned duration 3-6 months. Planned start date of 09/06/24 per MD.   CBC w/ Diff and CMP from 08/18/24 assessed, no relevant lab abnormalities requiring baseline dose adjustment required at this time. Prescription dose and frequency assessed for appropriateness.  Current medication list in Epic reviewed, DDIs with Xeloda  identified: Category C DDI between Xeloda  and Ondansetron  due to risk of Qtc prolongation with fluorouracil  products. Noted patient only taking PRN and PO route, risk higher with IV administration. No change in therapy warranted at this time.   Evaluated chart and no patient barriers to medication adherence noted.   Prescription has been e-scribed to the [facility] for benefits analysis and approval.  Oral Oncology Clinic will continue to follow for insurance authorization, copayment issues, initial counseling and start date.  Carol Mack, PharmD, BCPS, BCOP Hematology/Oncology Clinical Pharmacist [phone] 08/18/2024 3:15 PM

## 2024-08-18 NOTE — Telephone Encounter (Signed)
 Oral Oncology Patient Advocate Encounter  After completing a benefits investigation, prior authorization for Caprecitabine is not required at this time through [facility].  Patient's copay is $4.00      Charlott Hamilton,  CPhT-Adv  she/her/hers [facility]  [facility] Pharmacy Technician Patient Advocate Specialist III [facility] Phone: [phone]  Fax: [phone] [email or web address]

## 2024-08-18 NOTE — Progress Notes (Signed)
 " [facility]   Telephone:(336) [phone] Fax:(336) [phone]   Clinic Follow up Note   Patient Care Team: Celestia Rosaline SQUIBB, NP as PCP - General (Internal Medicine) Lanny Callander, MD as Consulting Physician (Hematology and Oncology) Debby Hila, MD as Consulting Physician (General Surgery) Stacia Glendia BRAVO, MD as Consulting Physician (Gastroenterology) [name], Fannie SAILOR, NP as Nurse Practitioner [facility] and Palliative Medicine)  Date of Service:  08/18/2024  CHIEF COMPLAINT: f/u of recurrent colon cancer   CURRENT THERAPY:  Pending adjuvant capecitabine   Oncology History   Colon cancer metastasized to mesenteric lymph nodes (HCC) -[id number] with mesentery node and possible liver metastasis, G2, MMR proficient, KRAS G12D mutation (+) -Patient initially presented with abdominal pain to the emergency room, underwent a colonoscopy in October 2024 which showed a large mass in the splenic lecture of left side colon. -She underwent left hemicolectomy on June 26, 2023.  Large mesenteric mass was also biopsied which confirmed metastatic adenocarcinoma. -Staging CT scan and a liver MRI showed a suspicious 0.8 cm mass in the left lobe of liver, concerning for metastasis.  No other evidence of distant metastatic disease. Liver biopsy on 08/27/2023 was benign.  -I discussed her metastatic disease and the probable incurable nature of her disease. -I recommend systemic treatment with chemotherapy FOLFOX, she started on 08/06/23. -NGS [facility] showed KRAS G12D and PIK3CA mutations, not a candidate for targeted therapy  -Her tumor was tested of for MMR which were normal, (+) KRAS mutation, not a candidate for targeted or immunotherapy. Beva added from cycle 2, she completed 12 cycles chemo on 01/13/2024 -CT 10/20/2023 was negative for residual disease. Abdominal MRI 02/05/2024 was negative for liver mets or other residual disease  - Will continue cancer surveillance with imaging  and ctDNA. Signatera was positive in Sep 2025. PET showed Two foci of Intense metabolic activity at the mid transverse colonic anastomosis with associated subtle bowel wall thickening and adjacent nodule, suspicious for recurrent colon cancer.  - She underwent transverse colon resection on July 26, 2024, which showed a 6.9 cm grade 2 invasive adenocarcinoma, 7 out of 8 positive lymph nodes.  Surgical margins were negative. MMR normal.   Assessment & Plan Recurrent colon cancer with mesenteric lymph node metastasis Recurrent metastatic adenocarcinoma of the colon with high-risk features, including early recurrence post-adjuvant chemotherapy, large recurrent tumor, and seven of eight lymph nodes positive at recent resection. She is recovering from surgery and remains at high risk for further recurrence. Possible mediastinal and retroperitoneal lymph node involvement is suspected, though no confirmed distant organ metastasis. Adjuvant oral chemotherapy is planned due to her preference to avoid intravenous therapy and prior toxicity. Prognosis remains guarded. - Ordered PET scan in March 2026 at her preferred location to evaluate for residual or recurrent disease, with focus on mediastinal and retroperitoneal lymph nodes. - Initiate oral capecitabine  (Xeloda ) on September 06, 2024, dosed two weeks on, one week off, twice daily after meals, for 3-6 months as tolerated. - Coordinated with specialty pharmacy for home delivery of capecitabine . - Arranged laboratory testing at a convenient location with follow-up by phone. - Maintained port in place for potential future intravenous chemotherapy. - Continue multidisciplinary care with surgical and medical oncology.  Chemotherapy-induced peripheral neuropathy Persistent significant numbness and paresthesia in feet and toes secondary to prior oxaliplatin -based chemotherapy, impacting quality of life. Ongoing monitoring is required, particularly with initiation  of new chemotherapy. - Continue topical therapy for neuropathy as previously prescribed. - Monitor neuropathy symptoms during ongoing treatment  and provide supportive care as needed.  Plan - Surgical path reviewed, we discussed very high risk of recurrence - I recommend adjuvant chemotherapy, she is not able to tolerate FOLFOX due to neuropathy, I recommend a single agent capecitabine , 2 weeks on and 1 week off.  Plan to start on Feb 23rd - She lives in [place] now, will have lab at [facility] hospital on 2/23 and phone visit with me after  -Signatera drawn today, will obtain a repeated PET scan in the next month    SUMMARY OF ONCOLOGIC HISTORY: Oncology History  Colon cancer metastasized to mesenteric lymph nodes (HCC)  06/26/2023 Cancer Staging   Staging form: Colon and Rectum, AJCC 8th Edition - Pathologic stage from 06/26/2023: Stage IVC (pT4a, pN1b, pM1c) - Signed by Lanny Callander, MD on 07/25/2023 Residual tumor (R): R1 - Microscopic   07/25/2023 Initial Diagnosis   Colon cancer metastasized to mesenteric lymph nodes (HCC)   08/06/2023 - 01/15/2024 Chemotherapy   Patient is on Treatment Plan : COLORECTAL FOLFOX + Bevacizumab  q14d        Discussed the use of AI scribe software for clinical note transcription with the patient, who gave verbal consent to proceed.  History of Present Illness Carol Mack is a 54 year old female with recurrent colon adenocarcinoma with intra-abdominal lymph node metastasis who presents for post-operative oncology follow-up and adjuvant chemotherapy planning.  She underwent resection of recurrent colon adenocarcinoma on July 28, 2024, and was discharged January 17. Pathology showed a 6.9 cm tumor with 7 of 8 lymph nodes positive. She is recovering with a healing incision and localized lower incisional soreness without significant pain or wound complications. She notes weight loss since surgery and is currently off work during recovery.  She previously  completed 12 cycles of IV FOLFOX plus bevacizumab  from January to July 2025 with radiographic remission, followed by recurrence within 6 months. She strongly prefers to avoid further IV chemotherapy because of difficult needle sticks and the burden of carrying a pump, and she favors an oral regimen. She has severe persistent chemotherapy-induced peripheral neuropathy with numbness and tingling in the feet and toes, using topical cream for partial relief.  She is worried about risk of further recurrence given rapid relapse after prior therapy and the high nodal burden, and she asks about expected side effects of oral chemotherapy, including possible skin changes, fatigue, and hair thinning.  She reports ongoing vaginal discharge since hospital discharge, which she attributes to prior antibiotics, and has been unable to reach her PCP and does not have a gynecologist. She denies port site pain and current hair loss. She asks to restart iron supplementation for mild anemia, which she previously used over the counter.  She is staying with her aunt for support, with her son and husband helping with care and transportation. She requests coordination of labs and imaging at a convenient location and help from her social worker with documentation and support.  May 31, 2024: Follow-up visit to review biopsy results confirming recurrent colon cancer at the prior surgical site, with two lesions identified at the anastomosis. PET scan showed several enlarged lymph nodes, with negative biopsy of the left axillary node. Surgical resection of the affected transverse colon is planned, with consideration for biopsy of mediastinal and retroperitoneal lymph nodes during surgery.     All other systems were reviewed with the patient and are negative.  MEDICAL HISTORY:  Past Medical History:  Diagnosis Date   Anemia    Anxiety    Asthma  Bartholin cyst    Cancer (HCC)    recurrent colon cancer   Chiari malformation  type I (HCC)    Hernia, inguinal    Hernia, inguinal    Hypertension    ILD (interstitial lung disease) (HCC)    Neuromuscular disorder (HCC)    neuropathy from chemo in hands and feet    SURGICAL HISTORY: Past Surgical History:  Procedure Laterality Date   EPIGASTRIC HERNIA REPAIR N/A 07/28/2024   Procedure: CLOSURE, HERNIA, EPIGASTRIC, ADULT;  Surgeon: Debby Hila, MD;  Location: [facility];  Service: General;  Laterality: N/A;   fibroid freezing     unsure   IR GENERIC HISTORICAL  07/27/2014   IR RADIOLOGIST EVAL & MGMT 07/27/2014 Juliene Balder, MD [facility] INTERV RAD   IR PORT REPAIR CENTRAL VENOUS ACCESS DEVICE  10/06/2023   PORTACATH PLACEMENT N/A 07/31/2023   Procedure: PLACEMENT OF PORT A CATHETER UNDER ULTRASOUND AND FLUOROSCOPIC GUIDANCE;  Surgeon: Sheldon Standing, MD;  Location: [facility];  Service: General;  Laterality: N/A;  GEN WITH ERAS 1.5 HOURS TOTAL   ROBOTIC ASSISTED TOTAL HYSTERECTOMY N/A 09/15/2018   Procedure: XI ROBOTIC ASSISTED TOTAL HYSTERECTOMY WITH SALPINGECTOMY;  Surgeon: Corene Coy, MD;  Location: [facility];  Service: Gynecology;  Laterality: N/A;    I have reviewed the social history and family history with the patient and they are unchanged from previous note.  ALLERGIES:  is allergic to hydrocodone -acetaminophen  and hydrocodone .  MEDICATIONS:  Current Outpatient Medications  Medication Sig Dispense Refill   capecitabine  (XELODA ) 500 MG tablet Take 3 tablets (1,500 mg total) by mouth 2 (two) times daily after a meal. Take 30 mins after meals. Take every 10-12 hours. Take for 14 days then off for 7 days Plan to start on 09/06/2024 84 tablet 0   albuterol  (VENTOLIN  HFA) 108 (90 Base) MCG/ACT inhaler Inhale 1-2 puffs into the lungs every 6 (six) hours as needed for shortness of breath. 2 each 1   amLODipine  (NORVASC ) 10 MG tablet Take 1 tablet (10 mg total) by mouth daily. 90 tablet 1   bisacodyl  5 MG EC tablet Take 4 tablets (20 mg total) by  mouth once daily as needed for Constipation for up to 1 dose 4 tablet 0   dronabinol  (MARINOL ) 5 MG capsule Take 1 capsule (5 mg total) by mouth 2 (two) times daily before lunch and supper. (Patient not taking: No sig reported) 60 capsule 2   ferrous sulfate  325 (65 FE) MG tablet Take 1 tablet (325 mg total) by mouth daily with breakfast. 90 tablet 0   ibuprofen  (ADVIL ) 800 MG tablet Take 1 tablet (800 mg total) by mouth every 6 (six) hours as needed for mild pain 16 tablet 0   lidocaine -prilocaine  (EMLA ) cream Apply to affected area once (Patient taking differently: Apply 1 Application topically as needed (for port access).) 30 g 3   LORazepam  (ATIVAN ) 0.5 MG tablet Take 1 tablet (0.5 mg total) by mouth every 8 (eight) hours as needed for anxiety. 30 tablet 0   metroNIDAZOLE  (FLAGYL ) 500 MG tablet Take 2 tablets (1,000 mg total) by mouth 3 (three) times daily for 3 doses Take according to your procedure colon prep instructions 6 tablet 0   neomycin  (MYCIFRADIN ) 500 MG tablet Take 2 tablets (1,000 mg total) by mouth 3 (three) times daily for 3 doses Take according to your procedure colon prep instructions 6 tablet 0   ondansetron  (ZOFRAN ) 4 MG tablet Take 1 tablet (4 mg total) by mouth 2 (  two) times daily Take 1 tablet at 3pm and then 1 tablet at 10pm 2 tablet 0   oxyCODONE  (OXY IR/ROXICODONE ) 5 MG immediate release tablet Take 1-2 tablets (5-10 mg total) by mouth every 6 (six) hours as needed for severe pain (pain score 7-10). 60 tablet 0   polyethylene glycol powder (GLYCOLAX /MIRALAX ) 17 GM/SCOOP powder Take by mouth once for 1 dose Take according to your procedure prep instructions. 238 g 0   No current facility-administered medications for this visit.    PHYSICAL EXAMINATION: ECOG PERFORMANCE STATUS: 1 - Symptomatic but completely ambulatory  Vitals:   08/18/24 1159  BP: 118/76  Pulse: 78  Resp: 17  Temp: 97.7 F (36.5 C)  SpO2: 97%   Wt Readings from Last 3 Encounters:  08/18/24 120  lb 6.4 oz (54.6 kg)  07/31/24 132 lb 11.5 oz (60.2 kg)  07/19/24 125 lb (56.7 kg)     GENERAL:alert, no distress and comfortable SKIN: skin color, texture, turgor are normal, no rashes or significant lesions EYES: normal, Conjunctiva are pink and non-injected, sclera clear NECK: supple, thyroid  normal size, non-tender, without nodularity LYMPH:  no palpable lymphadenopathy in the cervical, axillary  LUNGS: clear to auscultation and percussion with normal breathing effort HEART: regular rate & rhythm and no murmurs and no lower extremity edema ABDOMEN:abdomen soft, non-tender and normal bowel sounds, surgical incisions have healed well. Musculoskeletal:no cyanosis of digits and no clubbing  NEURO: alert & oriented x 3 with fluent speech, no focal motor/sensory deficits  Physical Exam    LABORATORY DATA:  I have reviewed the data as listed    Latest Ref Rng & Units 08/18/2024   11:30 AM 07/31/2024    4:46 AM 07/30/2024    5:08 AM  CBC  WBC 4.0 - 10.5 K/uL 5.8  8.3  9.0   Hemoglobin 12.0 - 15.0 g/dL 87.7  87.5  88.0   Hematocrit 36.0 - 46.0 % 34.5  35.6  34.4   Platelets 150 - 400 K/uL 310  266  244         Latest Ref Rng & Units 08/18/2024   11:30 AM 07/31/2024    4:46 AM 07/30/2024    5:08 AM  CMP  Glucose 70 - 99 mg/dL 878  93  896   BUN 6 - 20 mg/dL 13  9  10    Creatinine 0.44 - 1.00 mg/dL 9.09  9.30  9.27   Sodium 135 - 145 mmol/L 143  136  139   Potassium 3.5 - 5.1 mmol/L 4.3  4.1  4.3   Chloride 98 - 111 mmol/L 106  99  103   CO2 22 - 32 mmol/L 27  28  26    Calcium  8.9 - 10.3 mg/dL 9.8  9.9  9.4   Total Protein 6.5 - 8.1 g/dL 7.6     Total Bilirubin 0.0 - 1.2 mg/dL 0.3     Alkaline Phos 38 - 126 U/L 80     AST 15 - 41 U/L 16     ALT 0 - 44 U/L 7         RADIOGRAPHIC STUDIES: I have personally reviewed the radiological images as listed and agreed with the findings in the report. No results found.    Orders Placed This Encounter  Procedures   NM PET Image  Restag (PS) Skull Base To Thigh    Standing Status:   Future    Expected Date:   09/01/2024    Expiration Date:  08/18/2025    If indicated for the ordered procedure, I authorize the administration of a radiopharmaceutical per Radiology protocol:   Yes    Is the patient pregnant?:   No    Preferred imaging location?:   [facility]   All questions were answered. The patient knows to call the clinic with any problems, questions or concerns. No barriers to learning was detected. The total time spent in the appointment was 40 minutes, including review of chart and various tests results, discussions about plan of care and coordination of care plan     Onita Mattock, MD 08/18/2024     "

## 2024-08-18 NOTE — Progress Notes (Signed)
 "    Palliative Medicine [facility]  [phone] [phone]   Name: Carol Mack Date: 08/18/2024 MRN: [id number]  DOB: [birth year]  Patient Care Team: Celestia Rosaline SQUIBB, NP as PCP - General (Internal Medicine) Lanny Callander, MD as Consulting Physician (Hematology and Oncology) Debby Hila, MD as Consulting Physician (General Surgery) Stacia Glendia BRAVO, MD as Consulting Physician (Gastroenterology) [name], Fannie SAILOR, NP as Nurse Practitioner (Hospice and Palliative Medicine)   INTERVAL HISTORY: Carol Mack is a 54 y.o. female with oncologic medical history including metastatic colon cancer with liver involvement.  Palliative is seeing patient for symptom management and goals of care.   SOCIAL HISTORY:     reports that she has quit smoking. Her smoking use included cigarettes. She started smoking about 30 years ago. She has a 30.1 pack-year smoking history. She has been exposed to tobacco smoke. She has never used smokeless tobacco. She reports that she does not drink alcohol and does not use drugs.  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  MEDICATIONS:  Current Outpatient Medications  Medication Sig Dispense Refill   albuterol  (VENTOLIN  HFA) 108 (90 Base) MCG/ACT inhaler Inhale 1-2 puffs into the lungs every 6 (six) hours as needed for shortness of breath. 2 each 1   amLODipine  (NORVASC ) 10 MG tablet Take 1 tablet (10 mg total) by mouth daily. 90 tablet 1   bisacodyl  5 MG EC  tablet Take 4 tablets (20 mg total) by mouth once daily as needed for Constipation for up to 1 dose 4 tablet 0   capecitabine  (XELODA ) 500 MG tablet Take 3 tablets (1,500 mg total) by mouth 2 (two) times daily after a meal. Take 30 mins after meals. Take every 10-12 hours. Take for 14 days then off for 7 days Plan to start on 09/06/2024 84 tablet 0   dronabinol  (MARINOL ) 5 MG capsule Take 1 capsule (5 mg total) by mouth 2 (two) times daily before lunch and supper. (Patient not taking: No sig reported) 60 capsule 2   ferrous sulfate  325 (65 FE) MG tablet Take 1 tablet (325 mg total) by mouth daily with breakfast. 90 tablet 0   ibuprofen  (ADVIL ) 800 MG tablet Take 1 tablet (800 mg total) by mouth every 6 (six) hours as needed for mild pain 16 tablet 0   lidocaine -prilocaine  (EMLA ) cream Apply to affected area once (Patient taking differently: Apply 1 Application topically as needed (for port access).) 30 g 3   LORazepam  (ATIVAN ) 0.5 MG tablet Take 1 tablet (0.5 mg total) by mouth every 8 (eight) hours as needed for anxiety. 30 tablet 0   metroNIDAZOLE  (FLAGYL ) 500 MG tablet Take 2 tablets (1,000 mg total) by mouth 3 (three) times daily for 3 doses Take according to your procedure colon prep instructions 6 tablet 0   neomycin  (MYCIFRADIN ) 500 MG tablet Take 2 tablets (1,000 mg total) by mouth 3 (three) times daily for 3  doses Take according to your procedure colon prep instructions 6 tablet 0   ondansetron  (ZOFRAN ) 4 MG tablet Take 1 tablet (4 mg total) by mouth 2 (two) times daily Take 1 tablet at 3pm and then 1 tablet at 10pm 2 tablet 0   oxyCODONE  (OXY IR/ROXICODONE ) 5 MG immediate release tablet Take 1-2 tablets (5-10 mg total) by mouth every 6 (six) hours as needed for severe pain (pain score 7-10). 60 tablet 0   polyethylene glycol powder (GLYCOLAX /MIRALAX ) 17 GM/SCOOP powder Take by mouth once for 1 dose Take according to your procedure prep instructions. 238 g 0   No current facility-administered  medications for this visit.    VITAL SIGNS: LMP  (LMP Unknown)  There were no vitals filed for this visit.  Estimated body mass index is 20.67 kg/m as calculated from the following:   Height as of an earlier encounter on 08/18/24: 5' 4 (1.626 m).   Weight as of an earlier encounter on 08/18/24: 120 lb 6.4 oz (54.6 kg).   PERFORMANCE STATUS (ECOG) : 1 - Symptomatic but completely ambulatory  Physical Exam Constitutional:      Appearance: Normal appearance.  Cardiovascular:     Pulses: Normal pulses.  Pulmonary:     Effort: Pulmonary effort is normal.     Breath sounds: Normal breath sounds.  Musculoskeletal:        General: Normal range of motion.     Cervical back: Normal range of motion.  Neurological:     Mental Status: She is alert and oriented to person, place, and time. Mental status is at baseline.     IMPRESSION: Discussed the use of AI scribe software for clinical note transcription with the patient, who gave verbal consent to proceed.  History of Present Illness Carol Mack is a 54 year old female with metastatic colon cancer who presents to clinic for symptom management follow-up. No acute distress. No family present. She is living with her sister in [place] for support.   Carol Mack states her recent surgery was more painful than her previous one, affecting her energy levels and ability to work. She has not been able to return to work since the surgery. Her appetite has returned to normal, although she experienced nausea and vomiting initially after the surgery. Her weight had decreased to 120 pounds but has stabilized as she resumed regular eating.  She has been experiencing ongoing pain management issues following her recent surgery. She is down to two oxycodone  pills and has been taking them every four to six hours due to increased pain. Prior to the surgery, she was taking oxycodone  twice to three times a day for back and body pain. She is concerned about being  perceived as reliant on pain medication but acknowledges its necessity for her pain control at this point in her health. She is hopeful her pain improves allowing her to decrease use over time. She previously have tried to extend the time however will find herself in tears when waiting to long.   She experiences significant anxiety and depression, which she describes as 'off the hook'. She occasionally takes medication for anxiety but avoids mixing it with her pain medication. Her anxiety sometimes affects her ability to control her feelings. Carol Mack reports her anxiety is controlled with use of ativan .   Goals of Care Carol Mack reports that her cancer diagnosis was discussed with her, and she recalls being told that she may need to take a pill or undergo chemotherapy. She wants to  see her granddaughter graduate and visit her brother in prison, indicating these as personal goals amidst her health challenges. She is emotional expressing she thinks often about worst case scenario specifically how much longer I will be here! Emotional support provided. We discussed taking things one day at a time.   All questions answered and support provided.   I discussed the importance of continued conversation with family and their medical providers regarding overall plan of care and treatment options, ensuring decisions are within the context of the patients values and GOCs. Assessment & Plan Metastatic colon cancer with liver and nodal involvement Aggressive cancer with potential for recurrence. Prefers oral chemotherapy over intravenous chemotherapy due to personal preference and lifestyle considerations. - Patient to start oral chemotherapy as discussed with oncologist. - Will schedule follow-up with oncologist for ongoing management in 3 weeks by telephone per Dr. Lanny.   Neoplasm related pain Pain is well-controlled with oxycodone , taken every 4-6 hours post-surgery. Previously taken twice daily. Concerns  about reliance on medication for pain management. - Continue oxycodone  5 mg every 4 hours as needed for pain. - Ensured prescription is available at pharmacy.  Anxiety disorder Anxiety is exacerbated by current health situation and medication interactions. Occasional use of lorazepam , but advised against concurrent use with oxycodone . - Continue lorazepam  0.5 mg as needed for anxiety, ensuring not taken concurrently with oxycodone .  I will plan to touch base with patient by phone in 2-3 weeks. In the clinic 4-6 weeks. Sooner if needed.   Patient expressed understanding and was in agreement with this plan. She also understands that She can call the clinic at any time with any questions, concerns, or complaints.   Any controlled substances utilized were prescribed in the context of palliative care. PDMP has been reviewed.   I personally spent a total of 45 minutes in the care of the patient today including preparing to see the patient, getting/reviewing separately obtained history, performing a medically appropriate exam/evaluation, counseling and educating, placing orders, documenting clinical information in the EHR, and coordinating care. Visit consisted of counseling and education dealing with the complex and emotionally intense issues of symptom management and palliative care in the setting of serious and potentially life-threatening illness.  Levon Borer, AGPCNP-BC  Palliative Medicine Team/[facility]    "

## 2024-09-06 ENCOUNTER — Inpatient Hospital Stay

## 2024-09-13 ENCOUNTER — Inpatient Hospital Stay: Attending: Hematology | Admitting: Hematology
